# Patient Record
Sex: Male | Born: 1955
Health system: Southern US, Community
[De-identification: ages and names within clinical notes are randomized; demographics above are authoritative.]

## PROBLEM LIST (undated history)

## (undated) DIAGNOSIS — F17200 Nicotine dependence, unspecified, uncomplicated: Secondary | ICD-10-CM

## (undated) DIAGNOSIS — E119 Type 2 diabetes mellitus without complications: Secondary | ICD-10-CM

## (undated) DIAGNOSIS — H6060 Unspecified chronic otitis externa, unspecified ear: Secondary | ICD-10-CM

## (undated) DIAGNOSIS — J189 Pneumonia, unspecified organism: Secondary | ICD-10-CM

## (undated) DIAGNOSIS — I498 Other specified cardiac arrhythmias: Secondary | ICD-10-CM

## (undated) DIAGNOSIS — L03115 Cellulitis of right lower limb: Secondary | ICD-10-CM

## (undated) DIAGNOSIS — I10 Essential (primary) hypertension: Secondary | ICD-10-CM

## (undated) DIAGNOSIS — I214 Non-ST elevation (NSTEMI) myocardial infarction: Secondary | ICD-10-CM

## (undated) DIAGNOSIS — I2510 Atherosclerotic heart disease of native coronary artery without angina pectoris: Secondary | ICD-10-CM

## (undated) HISTORY — DX: Atherosclerotic heart disease of native coronary artery without angina pectoris: I25.10

## (undated) HISTORY — PX: CORONARY ANGIOPLASTY WITH STENT PLACEMENT: SHX49

## (undated) HISTORY — DX: Non-ST elevation (NSTEMI) myocardial infarction: I21.4

## (undated) HISTORY — DX: Pneumonia, unspecified organism: J18.9

## (undated) HISTORY — DX: Nicotine dependence, unspecified, uncomplicated: F17.200

---

## 1989-07-30 HISTORY — PX: CYSTECTOMY: SUR359

## 1998-08-23 ENCOUNTER — Encounter: Payer: Self-pay | Admitting: Emergency Medicine

## 1998-08-23 ENCOUNTER — Emergency Department (HOSPITAL_COMMUNITY): Admission: EM | Admit: 1998-08-23 | Discharge: 1998-08-23 | Payer: Self-pay | Admitting: Emergency Medicine

## 2000-04-07 ENCOUNTER — Encounter: Payer: Self-pay | Admitting: Internal Medicine

## 2000-04-07 ENCOUNTER — Ambulatory Visit (HOSPITAL_COMMUNITY): Admission: RE | Admit: 2000-04-07 | Discharge: 2000-04-07 | Payer: Self-pay | Admitting: Internal Medicine

## 2000-05-27 ENCOUNTER — Encounter: Payer: Self-pay | Admitting: Orthopedic Surgery

## 2000-05-27 ENCOUNTER — Ambulatory Visit (HOSPITAL_COMMUNITY): Admission: RE | Admit: 2000-05-27 | Discharge: 2000-05-27 | Payer: Self-pay | Admitting: Orthopedic Surgery

## 2001-07-17 ENCOUNTER — Emergency Department (HOSPITAL_COMMUNITY): Admission: EM | Admit: 2001-07-17 | Discharge: 2001-07-17 | Payer: Self-pay | Admitting: Emergency Medicine

## 2001-10-27 ENCOUNTER — Encounter: Payer: Self-pay | Admitting: Pulmonary Disease

## 2001-10-27 ENCOUNTER — Ambulatory Visit (HOSPITAL_COMMUNITY): Admission: RE | Admit: 2001-10-27 | Discharge: 2001-10-27 | Payer: Self-pay | Admitting: Pulmonary Disease

## 2002-01-30 ENCOUNTER — Encounter: Payer: Self-pay | Admitting: Pulmonary Disease

## 2002-01-30 ENCOUNTER — Ambulatory Visit (HOSPITAL_COMMUNITY): Admission: RE | Admit: 2002-01-30 | Discharge: 2002-01-30 | Payer: Self-pay | Admitting: Pulmonary Disease

## 2002-09-17 ENCOUNTER — Encounter: Admission: RE | Admit: 2002-09-17 | Discharge: 2002-09-17 | Payer: Self-pay | Admitting: Otolaryngology

## 2002-09-17 ENCOUNTER — Encounter: Payer: Self-pay | Admitting: Otolaryngology

## 2002-10-19 ENCOUNTER — Encounter: Admission: RE | Admit: 2002-10-19 | Discharge: 2002-10-19 | Payer: Self-pay | Admitting: Otolaryngology

## 2002-10-19 ENCOUNTER — Encounter: Payer: Self-pay | Admitting: Otolaryngology

## 2003-07-23 ENCOUNTER — Encounter: Payer: Self-pay | Admitting: Pulmonary Disease

## 2003-07-23 ENCOUNTER — Inpatient Hospital Stay (HOSPITAL_COMMUNITY): Admission: EM | Admit: 2003-07-23 | Discharge: 2003-07-26 | Payer: Self-pay | Admitting: Emergency Medicine

## 2003-07-23 ENCOUNTER — Encounter: Payer: Self-pay | Admitting: Emergency Medicine

## 2003-07-24 ENCOUNTER — Encounter: Payer: Self-pay | Admitting: Pulmonary Disease

## 2003-08-06 ENCOUNTER — Encounter: Admission: RE | Admit: 2003-08-06 | Discharge: 2003-11-04 | Payer: Self-pay | Admitting: Pulmonary Disease

## 2003-09-02 ENCOUNTER — Inpatient Hospital Stay (HOSPITAL_COMMUNITY): Admission: EM | Admit: 2003-09-02 | Discharge: 2003-09-04 | Payer: Self-pay | Admitting: Emergency Medicine

## 2003-09-02 ENCOUNTER — Encounter: Payer: Self-pay | Admitting: Emergency Medicine

## 2003-11-30 ENCOUNTER — Inpatient Hospital Stay (HOSPITAL_COMMUNITY): Admission: EM | Admit: 2003-11-30 | Discharge: 2003-12-03 | Payer: Self-pay | Admitting: Emergency Medicine

## 2004-01-08 ENCOUNTER — Emergency Department (HOSPITAL_COMMUNITY): Admission: EM | Admit: 2004-01-08 | Discharge: 2004-01-09 | Payer: Self-pay | Admitting: Emergency Medicine

## 2004-02-16 ENCOUNTER — Inpatient Hospital Stay (HOSPITAL_COMMUNITY): Admission: EM | Admit: 2004-02-16 | Discharge: 2004-02-21 | Payer: Self-pay | Admitting: *Deleted

## 2004-03-13 ENCOUNTER — Inpatient Hospital Stay (HOSPITAL_COMMUNITY): Admission: EM | Admit: 2004-03-13 | Discharge: 2004-03-19 | Payer: Self-pay | Admitting: Emergency Medicine

## 2004-08-06 ENCOUNTER — Ambulatory Visit: Payer: Self-pay | Admitting: *Deleted

## 2004-08-12 ENCOUNTER — Ambulatory Visit: Payer: Self-pay | Admitting: Internal Medicine

## 2004-08-21 ENCOUNTER — Ambulatory Visit (HOSPITAL_COMMUNITY): Admission: RE | Admit: 2004-08-21 | Discharge: 2004-08-21 | Payer: Self-pay | Admitting: Cardiology

## 2004-09-04 ENCOUNTER — Encounter (HOSPITAL_COMMUNITY): Admission: RE | Admit: 2004-09-04 | Discharge: 2004-12-03 | Payer: Self-pay | Admitting: Cardiology

## 2004-12-29 ENCOUNTER — Ambulatory Visit: Payer: Self-pay | Admitting: Internal Medicine

## 2005-01-05 ENCOUNTER — Ambulatory Visit: Payer: Self-pay | Admitting: Internal Medicine

## 2005-02-04 ENCOUNTER — Ambulatory Visit: Payer: Self-pay | Admitting: Internal Medicine

## 2005-07-08 ENCOUNTER — Ambulatory Visit: Payer: Self-pay | Admitting: Internal Medicine

## 2005-07-16 ENCOUNTER — Ambulatory Visit: Payer: Self-pay | Admitting: Internal Medicine

## 2005-08-26 ENCOUNTER — Ambulatory Visit: Payer: Self-pay | Admitting: Family Medicine

## 2005-09-17 ENCOUNTER — Ambulatory Visit: Payer: Self-pay | Admitting: Internal Medicine

## 2005-12-03 ENCOUNTER — Ambulatory Visit: Payer: Self-pay | Admitting: Internal Medicine

## 2005-12-14 ENCOUNTER — Ambulatory Visit: Payer: Self-pay | Admitting: Internal Medicine

## 2006-01-18 ENCOUNTER — Emergency Department (HOSPITAL_COMMUNITY): Admission: EM | Admit: 2006-01-18 | Discharge: 2006-01-18 | Payer: Self-pay | Admitting: Family Medicine

## 2006-03-10 ENCOUNTER — Ambulatory Visit: Payer: Self-pay | Admitting: Internal Medicine

## 2006-05-24 ENCOUNTER — Ambulatory Visit: Payer: Self-pay | Admitting: Internal Medicine

## 2006-05-30 ENCOUNTER — Ambulatory Visit: Payer: Self-pay | Admitting: Internal Medicine

## 2006-07-06 ENCOUNTER — Ambulatory Visit: Payer: Self-pay | Admitting: Internal Medicine

## 2006-07-06 ENCOUNTER — Encounter (INDEPENDENT_AMBULATORY_CARE_PROVIDER_SITE_OTHER): Payer: Self-pay | Admitting: *Deleted

## 2006-07-06 ENCOUNTER — Ambulatory Visit (HOSPITAL_COMMUNITY): Admission: RE | Admit: 2006-07-06 | Discharge: 2006-07-06 | Payer: Self-pay | Admitting: Internal Medicine

## 2006-07-28 ENCOUNTER — Emergency Department (HOSPITAL_COMMUNITY): Admission: EM | Admit: 2006-07-28 | Discharge: 2006-07-28 | Payer: Self-pay | Admitting: Emergency Medicine

## 2006-09-19 ENCOUNTER — Ambulatory Visit: Payer: Self-pay | Admitting: Internal Medicine

## 2006-11-02 ENCOUNTER — Encounter (INDEPENDENT_AMBULATORY_CARE_PROVIDER_SITE_OTHER): Payer: Self-pay | Admitting: *Deleted

## 2006-11-02 ENCOUNTER — Ambulatory Visit: Payer: Self-pay | Admitting: Internal Medicine

## 2006-11-02 LAB — CONVERTED CEMR LAB
ALT: 38 units/L (ref 0–53)
Alkaline Phosphatase: 56 units/L (ref 39–117)
BUN: 16 mg/dL (ref 6–23)
Basophils Relative: 0 % (ref 0–1)
Bilirubin, Direct: 0.1 mg/dL (ref 0.0–0.3)
Cholesterol: 195 mg/dL (ref 0–200)
Creatinine, Ser: 0.82 mg/dL (ref 0.40–1.50)
Eosinophils Relative: 3 % (ref 0–4)
HCT: 45.2 % (ref 41.0–49.0)
Hemoglobin: 16.1 g/dL (ref 13.9–16.8)
Indirect Bilirubin: 0.4 mg/dL (ref 0.0–0.9)
Lymphs Abs: 3.6 10*3/uL — ABNORMAL HIGH (ref 0.8–3.1)
MCV: 82 fL (ref 78.8–100.0)
Monocytes Absolute: 0.4 10*3/uL (ref 0.2–0.7)
Monocytes Relative: 6 % (ref 3–11)
RBC: 5.51 M/uL — ABNORMAL HIGH (ref 4.20–5.50)
Total Protein: 6.8 g/dL (ref 6.0–8.3)
Triglycerides: 99 mg/dL (ref <150)
WBC: 7 10*3/uL (ref 3.7–10.0)

## 2006-11-03 ENCOUNTER — Encounter (INDEPENDENT_AMBULATORY_CARE_PROVIDER_SITE_OTHER): Payer: Self-pay | Admitting: *Deleted

## 2006-11-03 LAB — CONVERTED CEMR LAB: Hgb A1c MFr Bld: 7.7 %

## 2006-11-25 ENCOUNTER — Encounter (INDEPENDENT_AMBULATORY_CARE_PROVIDER_SITE_OTHER): Payer: Self-pay | Admitting: *Deleted

## 2006-11-25 DIAGNOSIS — E119 Type 2 diabetes mellitus without complications: Secondary | ICD-10-CM | POA: Insufficient documentation

## 2006-11-25 DIAGNOSIS — I4891 Unspecified atrial fibrillation: Secondary | ICD-10-CM | POA: Insufficient documentation

## 2006-11-25 DIAGNOSIS — I2510 Atherosclerotic heart disease of native coronary artery without angina pectoris: Secondary | ICD-10-CM

## 2006-11-25 DIAGNOSIS — E291 Testicular hypofunction: Secondary | ICD-10-CM

## 2006-11-25 DIAGNOSIS — F1010 Alcohol abuse, uncomplicated: Secondary | ICD-10-CM | POA: Insufficient documentation

## 2006-11-25 DIAGNOSIS — E785 Hyperlipidemia, unspecified: Secondary | ICD-10-CM

## 2006-11-25 DIAGNOSIS — F17200 Nicotine dependence, unspecified, uncomplicated: Secondary | ICD-10-CM

## 2006-11-25 HISTORY — DX: Atherosclerotic heart disease of native coronary artery without angina pectoris: I25.10

## 2006-11-25 HISTORY — DX: Nicotine dependence, unspecified, uncomplicated: F17.200

## 2006-12-23 ENCOUNTER — Telehealth (INDEPENDENT_AMBULATORY_CARE_PROVIDER_SITE_OTHER): Payer: Self-pay | Admitting: *Deleted

## 2006-12-29 ENCOUNTER — Ambulatory Visit: Payer: Self-pay | Admitting: Hospitalist

## 2006-12-29 DIAGNOSIS — M25579 Pain in unspecified ankle and joints of unspecified foot: Secondary | ICD-10-CM

## 2006-12-29 DIAGNOSIS — M79609 Pain in unspecified limb: Secondary | ICD-10-CM | POA: Insufficient documentation

## 2006-12-29 LAB — CONVERTED CEMR LAB: Hgb A1c MFr Bld: 7 %

## 2007-01-03 ENCOUNTER — Encounter: Payer: Self-pay | Admitting: *Deleted

## 2007-02-15 ENCOUNTER — Ambulatory Visit (HOSPITAL_COMMUNITY): Admission: RE | Admit: 2007-02-15 | Discharge: 2007-02-15 | Payer: Self-pay | Admitting: Cardiology

## 2007-06-19 ENCOUNTER — Ambulatory Visit: Payer: Self-pay | Admitting: Internal Medicine

## 2007-06-19 ENCOUNTER — Encounter (INDEPENDENT_AMBULATORY_CARE_PROVIDER_SITE_OTHER): Payer: Self-pay | Admitting: *Deleted

## 2007-06-19 DIAGNOSIS — M25529 Pain in unspecified elbow: Secondary | ICD-10-CM | POA: Insufficient documentation

## 2007-06-19 LAB — CONVERTED CEMR LAB
Blood Glucose, Fingerstick: 150
Hgb A1c MFr Bld: 8.4 %
Neutrophil, Synovial: 26 % — ABNORMAL HIGH (ref 0–25)
WBC, Synovial: 7775 — ABNORMAL HIGH (ref 0–200)

## 2007-07-20 ENCOUNTER — Ambulatory Visit: Payer: Self-pay | Admitting: Internal Medicine

## 2007-07-20 ENCOUNTER — Ambulatory Visit (HOSPITAL_COMMUNITY): Admission: RE | Admit: 2007-07-20 | Discharge: 2007-07-20 | Payer: Self-pay | Admitting: Internal Medicine

## 2007-07-20 DIAGNOSIS — G8929 Other chronic pain: Secondary | ICD-10-CM

## 2007-07-20 DIAGNOSIS — M5416 Radiculopathy, lumbar region: Secondary | ICD-10-CM

## 2007-07-20 LAB — CONVERTED CEMR LAB: Blood Glucose, Fingerstick: 154

## 2007-08-14 ENCOUNTER — Ambulatory Visit: Payer: Self-pay | Admitting: Internal Medicine

## 2007-09-13 ENCOUNTER — Ambulatory Visit: Payer: Self-pay | Admitting: Internal Medicine

## 2007-09-13 ENCOUNTER — Encounter (INDEPENDENT_AMBULATORY_CARE_PROVIDER_SITE_OTHER): Payer: Self-pay | Admitting: *Deleted

## 2007-09-13 LAB — CONVERTED CEMR LAB
HDL: 31 mg/dL — ABNORMAL LOW (ref >39)
Hgb A1c MFr Bld: 7.9 %
LDL Cholesterol: 99 mg/dL (ref 0–99)

## 2008-03-01 ENCOUNTER — Ambulatory Visit: Payer: Self-pay | Admitting: Hospitalist

## 2008-03-01 DIAGNOSIS — S0080XA Unspecified superficial injury of other part of head, initial encounter: Secondary | ICD-10-CM

## 2008-03-01 DIAGNOSIS — S1090XA Unspecified superficial injury of unspecified part of neck, initial encounter: Secondary | ICD-10-CM

## 2008-03-01 DIAGNOSIS — S0000XA Unspecified superficial injury of scalp, initial encounter: Secondary | ICD-10-CM | POA: Insufficient documentation

## 2008-03-01 LAB — CONVERTED CEMR LAB: Blood Glucose, Fingerstick: 143

## 2008-03-13 ENCOUNTER — Telehealth (INDEPENDENT_AMBULATORY_CARE_PROVIDER_SITE_OTHER): Payer: Self-pay | Admitting: *Deleted

## 2008-05-06 ENCOUNTER — Ambulatory Visit: Payer: Self-pay | Admitting: Internal Medicine

## 2008-05-06 LAB — CONVERTED CEMR LAB: Blood Glucose, Fingerstick: 136

## 2008-06-14 ENCOUNTER — Encounter (INDEPENDENT_AMBULATORY_CARE_PROVIDER_SITE_OTHER): Payer: Self-pay | Admitting: *Deleted

## 2008-06-14 ENCOUNTER — Ambulatory Visit: Payer: Self-pay | Admitting: Infectious Diseases

## 2008-06-14 LAB — CONVERTED CEMR LAB
Blood Glucose, Fingerstick: 154
Hgb A1c MFr Bld: 7.7 %

## 2008-06-24 ENCOUNTER — Ambulatory Visit: Payer: Self-pay | Admitting: Internal Medicine

## 2008-06-27 ENCOUNTER — Telehealth (INDEPENDENT_AMBULATORY_CARE_PROVIDER_SITE_OTHER): Payer: Self-pay | Admitting: *Deleted

## 2008-06-28 ENCOUNTER — Ambulatory Visit: Payer: Self-pay | Admitting: Internal Medicine

## 2008-07-05 ENCOUNTER — Telehealth (INDEPENDENT_AMBULATORY_CARE_PROVIDER_SITE_OTHER): Payer: Self-pay | Admitting: *Deleted

## 2008-07-09 ENCOUNTER — Telehealth (INDEPENDENT_AMBULATORY_CARE_PROVIDER_SITE_OTHER): Payer: Self-pay | Admitting: *Deleted

## 2008-08-02 ENCOUNTER — Telehealth (INDEPENDENT_AMBULATORY_CARE_PROVIDER_SITE_OTHER): Payer: Self-pay | Admitting: *Deleted

## 2008-09-04 ENCOUNTER — Encounter (INDEPENDENT_AMBULATORY_CARE_PROVIDER_SITE_OTHER): Payer: Self-pay | Admitting: *Deleted

## 2008-09-13 ENCOUNTER — Telehealth: Payer: Self-pay | Admitting: Internal Medicine

## 2008-09-13 ENCOUNTER — Ambulatory Visit: Payer: Self-pay | Admitting: Internal Medicine

## 2008-09-16 ENCOUNTER — Ambulatory Visit: Payer: Self-pay | Admitting: Internal Medicine

## 2008-09-16 LAB — CONVERTED CEMR LAB
Blood Glucose, Fingerstick: 136
Hgb A1c MFr Bld: 6.6 %

## 2008-10-30 ENCOUNTER — Telehealth (INDEPENDENT_AMBULATORY_CARE_PROVIDER_SITE_OTHER): Payer: Self-pay | Admitting: *Deleted

## 2008-11-01 ENCOUNTER — Telehealth (INDEPENDENT_AMBULATORY_CARE_PROVIDER_SITE_OTHER): Payer: Self-pay | Admitting: *Deleted

## 2008-12-03 ENCOUNTER — Telehealth (INDEPENDENT_AMBULATORY_CARE_PROVIDER_SITE_OTHER): Payer: Self-pay | Admitting: *Deleted

## 2009-01-02 ENCOUNTER — Ambulatory Visit: Payer: Self-pay | Admitting: Internal Medicine

## 2009-01-02 ENCOUNTER — Encounter (INDEPENDENT_AMBULATORY_CARE_PROVIDER_SITE_OTHER): Payer: Self-pay | Admitting: *Deleted

## 2009-01-02 LAB — CONVERTED CEMR LAB: Blood Glucose, Fingerstick: 137

## 2009-01-06 LAB — CONVERTED CEMR LAB: Microalb, Ur: 57.44 mg/dL — ABNORMAL HIGH (ref 0.00–1.89)

## 2009-01-07 ENCOUNTER — Telehealth: Payer: Self-pay | Admitting: *Deleted

## 2009-01-09 ENCOUNTER — Ambulatory Visit: Payer: Self-pay | Admitting: Internal Medicine

## 2009-01-09 ENCOUNTER — Encounter (INDEPENDENT_AMBULATORY_CARE_PROVIDER_SITE_OTHER): Payer: Self-pay | Admitting: *Deleted

## 2009-01-15 LAB — CONVERTED CEMR LAB
Albumin: 4.1 g/dL (ref 3.5–5.2)
Alkaline Phosphatase: 63 units/L (ref 39–117)
BUN: 18 mg/dL (ref 6–23)
Glucose, Bld: 153 mg/dL — ABNORMAL HIGH (ref 70–99)
HDL: 47 mg/dL (ref >39)
LDL Cholesterol: 131 mg/dL — ABNORMAL HIGH (ref 0–99)
Total Bilirubin: 0.5 mg/dL (ref 0.3–1.2)
Triglycerides: 158 mg/dL — ABNORMAL HIGH (ref <150)

## 2009-01-16 ENCOUNTER — Telehealth (INDEPENDENT_AMBULATORY_CARE_PROVIDER_SITE_OTHER): Payer: Self-pay | Admitting: *Deleted

## 2009-01-17 ENCOUNTER — Telehealth (INDEPENDENT_AMBULATORY_CARE_PROVIDER_SITE_OTHER): Payer: Self-pay | Admitting: *Deleted

## 2009-02-04 ENCOUNTER — Telehealth (INDEPENDENT_AMBULATORY_CARE_PROVIDER_SITE_OTHER): Payer: Self-pay | Admitting: *Deleted

## 2009-04-29 ENCOUNTER — Ambulatory Visit: Payer: Self-pay | Admitting: Internal Medicine

## 2009-04-30 ENCOUNTER — Encounter (INDEPENDENT_AMBULATORY_CARE_PROVIDER_SITE_OTHER): Payer: Self-pay | Admitting: *Deleted

## 2009-08-12 ENCOUNTER — Telehealth: Payer: Self-pay | Admitting: *Deleted

## 2009-08-14 ENCOUNTER — Ambulatory Visit: Payer: Self-pay | Admitting: Infectious Diseases

## 2009-08-14 DIAGNOSIS — K089 Disorder of teeth and supporting structures, unspecified: Secondary | ICD-10-CM | POA: Insufficient documentation

## 2009-08-14 LAB — CONVERTED CEMR LAB: Blood Glucose, Fingerstick: 170

## 2009-08-27 ENCOUNTER — Encounter: Payer: Self-pay | Admitting: Internal Medicine

## 2009-09-23 ENCOUNTER — Ambulatory Visit: Payer: Self-pay | Admitting: Internal Medicine

## 2009-09-23 DIAGNOSIS — R05 Cough: Secondary | ICD-10-CM

## 2009-10-06 ENCOUNTER — Telehealth: Payer: Self-pay | Admitting: Internal Medicine

## 2009-10-08 ENCOUNTER — Ambulatory Visit (HOSPITAL_COMMUNITY): Admission: RE | Admit: 2009-10-08 | Discharge: 2009-10-08 | Payer: Self-pay | Admitting: Infectious Disease

## 2009-10-08 ENCOUNTER — Ambulatory Visit: Payer: Self-pay | Admitting: Infectious Disease

## 2009-10-08 DIAGNOSIS — R0789 Other chest pain: Secondary | ICD-10-CM

## 2009-10-08 LAB — CONVERTED CEMR LAB
Blood Glucose, Fingerstick: 151
Eosinophils Absolute: 0.2 10*3/uL (ref 0.0–0.7)
Lymphocytes Relative: 45 % (ref 12–46)
Lymphs Abs: 3.1 10*3/uL (ref 0.7–4.0)
MCV: 90.9 fL (ref >=78.0)
Monocytes Relative: 7 % (ref 3–12)
Neutro Abs: 3 10*3/uL (ref 1.7–7.7)
Neutrophils Relative %: 45 % (ref 43–77)
RBC: 4.62 M/uL (ref 4.22–5.81)
WBC: 6.8 10*3/uL (ref 4.0–10.5)

## 2009-10-09 ENCOUNTER — Telehealth: Payer: Self-pay | Admitting: Internal Medicine

## 2009-11-17 ENCOUNTER — Telehealth: Payer: Self-pay | Admitting: Internal Medicine

## 2009-12-17 ENCOUNTER — Telehealth: Payer: Self-pay | Admitting: Internal Medicine

## 2010-01-19 ENCOUNTER — Ambulatory Visit (HOSPITAL_COMMUNITY): Admission: RE | Admit: 2010-01-19 | Discharge: 2010-01-19 | Payer: Self-pay | Admitting: Internal Medicine

## 2010-01-19 ENCOUNTER — Encounter: Payer: Self-pay | Admitting: Internal Medicine

## 2010-01-19 ENCOUNTER — Ambulatory Visit: Payer: Self-pay | Admitting: Internal Medicine

## 2010-01-19 LAB — CONVERTED CEMR LAB
Blood Glucose, Fingerstick: 137
Hgb A1c MFr Bld: 7.4 %

## 2010-01-21 ENCOUNTER — Ambulatory Visit: Payer: Self-pay | Admitting: Internal Medicine

## 2010-02-04 LAB — CONVERTED CEMR LAB
ALT: 25 units/L (ref 0–53)
AST: 18 units/L (ref 0–37)
Alkaline Phosphatase: 67 units/L (ref 39–117)
Cholesterol: 206 mg/dL — ABNORMAL HIGH (ref 0–200)
Creatinine, Ser: 0.8 mg/dL (ref 0.40–1.50)
Sodium: 138 meq/L (ref 135–145)
Testosterone-% Free: 2.4 % (ref 1.6–2.9)
Total Bilirubin: 0.3 mg/dL (ref 0.3–1.2)
Total CHOL/HDL Ratio: 5.9
Total Protein: 7 g/dL (ref 6.0–8.3)
Triglycerides: 158 mg/dL — ABNORMAL HIGH (ref <150)
VLDL: 32 mg/dL (ref 0–40)

## 2010-02-18 ENCOUNTER — Telehealth: Payer: Self-pay | Admitting: Internal Medicine

## 2010-04-08 ENCOUNTER — Ambulatory Visit (HOSPITAL_COMMUNITY): Admission: RE | Admit: 2010-04-08 | Discharge: 2010-04-08 | Payer: Self-pay | Admitting: Internal Medicine

## 2010-04-08 ENCOUNTER — Ambulatory Visit: Payer: Self-pay | Admitting: Internal Medicine

## 2010-04-08 DIAGNOSIS — K219 Gastro-esophageal reflux disease without esophagitis: Secondary | ICD-10-CM | POA: Insufficient documentation

## 2010-08-31 ENCOUNTER — Telehealth: Payer: Self-pay | Admitting: Internal Medicine

## 2010-09-03 ENCOUNTER — Telehealth: Payer: Self-pay | Admitting: Internal Medicine

## 2010-09-15 ENCOUNTER — Telehealth: Payer: Self-pay | Admitting: Internal Medicine

## 2010-09-16 ENCOUNTER — Ambulatory Visit: Payer: Self-pay | Admitting: Internal Medicine

## 2010-11-16 ENCOUNTER — Telehealth: Payer: Self-pay | Admitting: Internal Medicine

## 2010-12-29 NOTE — Progress Notes (Signed)
Summary: Refill/gh  Phone Note Refill Request Message from:  Fax from Pharmacy on December 17, 2009 11:42 AM  Refills Requested: Medication #1:  ZESTRIL 10 MG TABS take 1 pill by mouth daily.   Last Refilled: 11/15/2009 Last appointment was 10/08/2009 and last labs were 12/30/2008   Method Requested: Electronic Initial call taken by: Angelina Ok RN,  December 17, 2009 11:43 AM  Follow-up for Phone Call        needs to be seen w/in next 30 days.  No metabolic panel in last 1 year although seen in clinic for other issues.    will give 1 month supply only Follow-up by: Mariea Stable MD,  December 18, 2009 11:08 PM    Prescriptions: ZESTRIL 10 MG TABS (LISINOPRIL) take 1 pill by mouth daily.  #30 x 0   Entered and Authorized by:   Mariea Stable MD   Signed by:   Mariea Stable MD on 12/18/2009   Method used:   Electronically to        [facility] (retail)       [address]       [place], [place]  [address]       Ph: [phone]       Fax: [phone]   RxID:   [id number]

## 2010-12-29 NOTE — Assessment & Plan Note (Signed)
Summary: est-ck/fu/meds/cfb   Vital Signs:  Patient profile:   55 year old male Height:      75 inches (190.50 cm) Weight:      285.4 pounds (129.73 kg) BMI:     35.80 Temp:     97.9 degrees F (36.61 degrees C) oral Pulse rate:   78 / minute BP sitting:   118 / 75  (right arm) Cuff size:   large  Vitals Entered By: Krystal Eaton Duncan Dull) (January 19, 2010 3:03 PM) CC: cold-lkes sxs x67mths, med refill, right arm pain off and on x Is Patient Diabetic? Yes Did you bring your meter with you today? No Pain Assessment Patient in pain? yes     Location: right arm Intensity: 4 Type: aching Onset of pain  off and on  Nutritional Status BMI of > 30 = obese CBG Result 137  Have you ever been in a relationship where you felt threatened, hurt or afraid?No   Does patient need assistance? Functional Status Self care Ambulation Normal   Primary Care Provider:  Mariea Stable MD  CC:  cold-lkes sxs x23mths, med refill, and right arm pain off and on x .  History of Present Illness: [name] is a 55 yo man with pMH as outlined in chart.  He reports a cold for last 4 months.  Also needs refills.  As for the cold, it consists of sneezing and cough productive of yellow-green sputum.  He was last seen and had some labs and CXR done at that time.  He states this comes and goes.  Feels its worse at night after lying down.  Reports he feels it builds up in his throat.  No fevers or chills.  No chest pain or sob.  Preventive Screening-Counseling & Management  Alcohol-Tobacco     Smoking Status: quit     Year Quit: 2003  Current Medications (verified): 1)  Aspirin 81 Mg Tbec (Aspirin) .... Take 1 Tablet By Mouth Once A Day 2)  Metformin Hcl 1000 Mg  Tabs (Metformin Hcl) .... Take 1 Tablet By Mouth Two Times A Day 3)  Imdur 60 Mg Tb24 (Isosorbide Mononitrate) .... Take 1 Tablet By Mouth Once A Day 4)  Sotalol Hcl 80 Mg Tabs (Sotalol Hcl) .... Take 1/2 Tablet By Mouth Every 12  Hours 5)  Nitroquick 0.4 Mg Subl (Nitroglycerin) .... Take Sublingually As Needed With A Daily Maximum of 3 Tablets 6)  Triamcinolone Acetonide 0.1 %  Crea (Triamcinolone Acetonide) .... Apply To Scalp and Ear Once A Day 7)  Truetrack Test   Strp (Glucose Blood) .... Use To Test Blood Glucsoe 2-3x Daily 8)  Lancets   Misc (Lancets) .... Use To Test Blood Glucsoe 2-3x Daily 9)  Zestril 10 Mg Tabs (Lisinopril) .... Take 1 Pill By Mouth Daily. 10)  Pravachol 40 Mg Tabs (Pravastatin Sodium) .... Take 1 Tablet By Mouth Once A Day 11)  Vicodin 5-500 Mg Tabs (Hydrocodone-Acetaminophen) .... Take One Pill Every 6 Hours As Needed For Elbow Pain. 12)  Glipizide 5 Mg Xr24h-Tab (Glipizide) .... Take 1 Tablet By Mouth Once A Day  Allergies (verified): No Known Drug Allergies  Past History:  Past Medical History: Last updated: 06/28/2008 Coronary artery disease (S/P PTCA with stent placement in 2003). Diabetes mellitus, type II (diagnosed in 2004). Hyperlipidemia. Tobacco Abuse. Testicular Hypogonadism. Atrial fibrillation:  follows Dr Sharyn Lull. Alcohol Abuse: in remission:  quit 3 years ago. Right foot pain and swelling. Right elbow pain. Nausea, diaphoresis.  Past  Surgical History: Last updated: 11/25/2006 PTCA/stent placement 2003  Family History: Last updated: 07-07-08 Mom- died of ? uterine cancer at 59 yrs. Dad died in his 29's- unknown medical problems. 2 Brothers- healthy 2 Sisters- healthy.  Social History: Last updated: 07-Jul-2008 Former Smoker. Works as a Education administrator. Married, no kids. Studied upto 12th grade.  Risk Factors: Smoking Status: quit (01/19/2010)  Review of Systems      See HPI  Physical Exam  General:  alert, well-developed, and well-nourished.   Eyes:  vision grossly intact, pupils equal, pupils round, and pupils reactive to light.  anicteric   Impression & Recommendations:  Problem # 1:  COUGH (ICD-786.2) likely post nasal drip will give claritin  and flonase  Problem # 2:  ATRIAL FIBRILLATION (ICD-427.31)  Followed by Dr. Sharyn Lull Will check electrolytes and ECG for QT since we have prescribed sotalol in past Advised to discuss with Dr. Sharyn Lull and have him manage the sotalol On ASA given CHADS = 1  His updated medication list for this problem includes:    Aspirin 81 Mg Tbec (Aspirin) .Marland Kitchen... Take 1 tablet by mouth once a day    Sotalol Hcl 80 Mg Tabs (Sotalol hcl) .Marland Kitchen... Take 1/2 tablet by mouth every 12 hours  Orders: 12 Lead EKG (12 Lead EKG)  Problem # 3:  DIABETES MELLITUS, TYPE II (ICD-250.00) Hgb A1c improved will continue current regimen and have pt check glucose before breakfast. Bring meter next visit to see if glipizide can be increased.  His updated medication list for this problem includes:    Aspirin 81 Mg Tbec (Aspirin) .Marland Kitchen... Take 1 tablet by mouth once a day    Metformin Hcl 1000 Mg Tabs (Metformin hcl) .Marland Kitchen... Take 1 tablet by mouth two times a day    Zestril 10 Mg Tabs (Lisinopril) .Marland Kitchen... Take 1 pill by mouth daily.    Glipizide 5 Mg Xr24h-tab (Glipizide) .Marland Kitchen... Take 1 tablet by mouth once a day  Orders: T-Hgb A1C (in-house) ([address]UE) T- Capillary Blood Glucose ([address])  Labs Reviewed: Creat: 0.81 (01/09/2009)     Last Eye Exam: No diabetic retinopathy.    (08/27/2009) Reviewed HgBA1c results: 7.4 (01/19/2010)  8.4 (08/14/2009)  Problem # 4:  HYPERLIPIDEMIA (ICD-272.4)  not taking statin will refill check lipids and LFTs while fasting will need to recheck in 3 months to see if pravachol 40 is enough  His updated medication list for this problem includes:    Pravachol 40 Mg Tabs (Pravastatin sodium) .Marland Kitchen... Take 1 tablet by mouth once a day  Orders: T-Lipid Profile [id number])  Labs Reviewed: SGOT: 15 (01/09/2009)   SGPT: 24 (01/09/2009)   HDL:47 (01/09/2009), 31 (09/13/2007)  LDL:131 (01/09/2009), 99 (56/21/3086)  Chol:210 (01/09/2009), 146 (09/13/2007)  Trig:158 (01/09/2009), 81  (09/13/2007)  Problem # 5:  ELBOW PAIN (ICD-719.42) does well with as needed vicodin will refill as he has been on this for some time will need to review work up in past  Problem # 6:  HYPOGONADISM (ICD-257.2) pt not using testosterone supplements 2/2 cost will recheck total and free levels discussed, energy levels adequate and spontaneous erections still occuring (libido is down)  Orders: T-Testosterone; Total [phone]) T- * Misc. Laboratory test [phone])  Complete Medication List: 1)  Aspirin 81 Mg Tbec (Aspirin) .... Take 1 tablet by mouth once a day 2)  Metformin Hcl 1000 Mg Tabs (Metformin hcl) .... Take 1 tablet by mouth two times a day 3)  Imdur 60 Mg Tb24 (Isosorbide mononitrate) .... Take 1 tablet by  mouth once a day 4)  Sotalol Hcl 80 Mg Tabs (Sotalol hcl) .... Take 1/2 tablet by mouth every 12 hours 5)  Nitroquick 0.4 Mg Subl (Nitroglycerin) .... Take sublingually as needed with a daily maximum of 3 tablets 6)  Triamcinolone Acetonide 0.1 % Crea (Triamcinolone acetonide) .... Apply to scalp and ear once a day 7)  Truetrack Test Strp (Glucose blood) .... Use to test blood glucsoe 2-3x daily 8)  Lancets Misc (Lancets) .... Use to test blood glucsoe 2-3x daily 9)  Zestril 10 Mg Tabs (Lisinopril) .... Take 1 pill by mouth daily. 10)  Pravachol 40 Mg Tabs (Pravastatin sodium) .... Take 1 tablet by mouth once a day 11)  Vicodin 5-500 Mg Tabs (Hydrocodone-acetaminophen) .... Take one pill every 6 hours as needed for elbow pain. 12)  Glipizide 5 Mg Xr24h-tab (Glipizide) .... Take 1 tablet by mouth once a day 13)  Claritin 10 Mg Tabs (Loratadine) .... Take 1 tablet by mouth once a day 14)  Flonase 50 Mcg/act Susp (Fluticasone propionate) .... Use 2 sprays in each nostril prior to bedtime  Other Orders: T-Comprehensive Metabolic Panel ([id number])  Patient Instructions: 1)  Please schedule a follow-up appointment in 3 months. 2)  make sure to check your sugar most days  before breakfast and bring your meter in next visit. 3)  remember to follow up with Dr. Sharyn Lull and discuss the sotalol and imdur with him 4)  will put in for some labs, you will need to come back on an empty stomach to have them done. 5)  if you have any problems, call clinic.  6)  have sent in for claritin and flonase for your cough. Prescriptions: VICODIN 5-500 MG TABS (HYDROCODONE-ACETAMINOPHEN) Take one pill every 6 hours as needed for elbow pain.  #60 x 1   Entered and Authorized by:   Tyrone Stable MD   Signed by:   Tyrone Stable MD on 01/19/2010   Method used:   Print then Give to Patient   RxID:   [id number] FLONASE 50 MCG/ACT SUSP (FLUTICASONE PROPIONATE) use 2 sprays in each nostril prior to bedtime  #1 x 0   Entered and Authorized by:   Tyrone Stable MD   Signed by:   Tyrone Stable MD on 01/19/2010   Method used:   Electronically to        [facility] (retail)       [address]       [place], [place]  [address]       Ph: [phone]       Fax: [phone]   RxID:   [id number] CLARITIN 10 MG TABS (LORATADINE) Take 1 tablet by mouth once a day  #30 x 0   Entered and Authorized by:   Tyrone Stable MD   Signed by:   Tyrone Stable MD on 01/19/2010   Method used:   Electronically to        [facility] (retail)       [address]       [place], [place]  [address]       Ph: [phone]       Fax: [phone]   RxID:   [id number] PRAVACHOL 40 MG TABS (PRAVASTATIN SODIUM) Take 1 tablet by mouth once a day  #30 x 6   Entered and Authorized by:   Tyrone Stable MD   Signed  by:   Tyrone Stable MD on 01/19/2010   Method used:   Electronically to        [facility] (retail)       [address]       [place], [place]  [address]       Ph: [phone]       Fax: [phone]   RxID:   [id number] ZESTRIL 10 MG TABS (LISINOPRIL) take 1 pill by  mouth daily.  #30 x 3   Entered and Authorized by:   Tyrone Stable MD   Signed by:   Tyrone Stable MD on 01/19/2010   Method used:   Electronically to        [facility] (retail)       [address]       [place], [place]  [address]       Ph: [phone]       Fax: [phone]   RxID:   [id number] TRIAMCINOLONE ACETONIDE 0.1 %  CREA (TRIAMCINOLONE ACETONIDE) Apply to scalp and ear once a day  #1 month x 2   Entered and Authorized by:   Tyrone Stable MD   Signed by:   Tyrone Stable MD on 01/19/2010   Method used:   Electronically to        [facility] (retail)       [address]       [place], [place]  [address]       Ph: [phone]       Fax: [phone]   RxID:   [id number] NITROQUICK 0.4 MG SUBL (NITROGLYCERIN) Take sublingually as needed with a daily maximum of 3 tablets  #30 x 3   Entered and Authorized by:   Tyrone Stable MD   Signed by:   Tyrone Stable MD on 01/19/2010   Method used:   Electronically to        [facility] (retail)       [address]       [place], [place]  [address]       Ph: [phone]       Fax: [phone]   RxID:   [id number]   Prevention & Chronic Care Immunizations   Influenza vaccine: Fluvax Non-MCR  (10/08/2009)   Influenza vaccine deferral: Deferred  (09/23/2009)    Tetanus booster: Not documented   Td booster deferral: Deferred  (01/19/2010)    Pneumococcal vaccine: Not documented  Colorectal Screening   Hemoccult: Not documented   Hemoccult action/deferral: Deferred  (08/14/2009)    Colonoscopy: Not documented   Colonoscopy action/deferral: Deferred  (08/14/2009)  Other Screening   PSA: Not documented   Smoking status: quit  (01/19/2010)  Diabetes Mellitus   HgbA1C: 7.4  (01/19/2010)   HgbA1C action/deferral: Ordered  (08/14/2009)    Eye exam: No diabetic retinopathy.     (08/27/2009)   Eye  exam due: 08/2010    Foot exam: yes  (08/14/2009)   Foot exam action/deferral: Do today   High risk foot: Not documented   Foot care education: Done  (08/14/2009)    Urine microalbumin/creatinine ratio: 464.8  (01/02/2009)   Urine microalbumin action/deferral: Not indicated    Diabetes flowsheet reviewed?: Yes   Progress toward A1C goal: Unchanged  Lipids   Total  Cholesterol: 210  (01/09/2009)   Lipid panel action/deferral: Lipid Panel ordered   LDL: 131  (01/09/2009)   LDL Direct: Not documented   HDL: 47  (01/09/2009)   Triglycerides: 158  (01/09/2009)    SGOT (AST): 15  (01/09/2009)   BMP action: Ordered   SGPT (ALT): 24  (01/09/2009) CMP ordered    Alkaline phosphatase: 63  (01/09/2009)   Total bilirubin: 0.5  (01/09/2009)    Lipid flowsheet reviewed?: Yes   Progress toward LDL goal: Unchanged  Self-Management Support :   Personal Goals (by the next clinic visit) :     Personal A1C goal: 7  (08/14/2009)     Personal blood pressure goal: 130/80  (08/14/2009)     Personal LDL goal: 70  (09/23/2009)    Patient will work on the following items until the next clinic visit to reach self-care goals:     Medications and monitoring: take my medicines every day  (01/19/2010)     Eating: drink diet soda or water instead of juice or soda, eat foods that are low in salt, eat baked foods instead of fried foods  (01/19/2010)     Activity: take a 30 minute walk every day  (08/14/2009)     Other: also lifts weights  (08/14/2009)    Diabetes self-management support: CBG self-monitoring log, Resources for patients handout, Written self-care plan  (01/19/2010)   Diabetes care plan printed   Last diabetes self-management training by diabetes educator: 09/13/2007   Last medical nutrition therapy: 06/24/2008    Lipid self-management support: CBG self-monitoring log, Resources for patients handout, Written self-care plan  (01/19/2010)   Lipid self-care plan printed.      Resource  handout printed.  Process Orders Check Orders Results:     [facility]: ABN not required for this insurance Tests Sent for requisitioning (January 19, 2010 3:52 PM):     01/19/2010: [facility] -- T-Lipid Profile [id number] (signed)     01/19/2010: [facility] -- T-Comprehensive Metabolic Panel [[id number]] (signed)     01/19/2010: [facility] -- T-Testosterone; Total [id number] (signed)     01/19/2010: [facility] -- T- * Misc. Laboratory test [phone] (signed)   Laboratory Results   Blood Tests   Date/Time Received: January 19, 2010 3:26 PM  Date/Time Reported: Burke Keels  January 19, 2010 3:26 PM   HGBA1C: 7.4%   (Normal Range: Non-Diabetic - 3-6%   Control Diabetic - 6-8%) CBG Random:: 137mg /dL

## 2010-12-29 NOTE — Progress Notes (Signed)
Summary: med refill/gp  Phone Note Refill Request Message from:  Fax from Pharmacy on September 03, 2010 11:26 AM  Refills Requested: Medication #1:  PRAVACHOL 40 MG TABS Take 1 tablet by mouth once a day   Last Refilled: 07/21/2010 Last appt. 04/08/10; next appt. 09/16/10.   Method Requested: Electronic Initial call taken by: Chinita Pester RN,  September 03, 2010 11:25 AM  Follow-up for Phone Call        Rx faxed to pharmacy Follow-up by: Mariea Stable MD,  September 03, 2010 4:46 PM    Prescriptions: PRAVACHOL 40 MG TABS (PRAVASTATIN SODIUM) Take 1 tablet by mouth once a day  #30 x 6   Entered and Authorized by:   Mariea Stable MD   Signed by:   Mariea Stable MD on 09/03/2010   Method used:   Electronically to        [facility] (retail)       [address]       [place], [place]  [address]       Ph: [phone]       Fax: [phone]   RxID:   [id number]

## 2010-12-29 NOTE — Assessment & Plan Note (Signed)
Summary: EST-CK/FU/MEDS/CFB   Vital Signs:  Patient profile:   55 year old male Height:      75 inches (190.50 cm) Weight:      281.7 pounds (128.05 kg) BMI:     35.34 Temp:     97.6 degrees F oral Pulse rate:   70 / minute BP sitting:   122 / 77  (right arm)  Vitals Entered By: Tyrone Pester RN (Apr 08, 2010 4:17 PM) CC: Coughing x 3 months or more  and hoarseness x 2 weeks.  Toothache. Med refills. Is Patient Diabetic? Yes Did you bring your meter with you today? No Pain Assessment Patient in pain? no      Nutritional Status BMI of > 30 = obese CBG Result 124  Have you ever been in a relationship where you felt threatened, hurt or afraid?Unable to ask; someone w/pt.   Does patient need assistance? Functional Status Self care Ambulation Normal   Primary Care Provider:  Mariea Stable MD  CC:  Coughing x 3 months or more  and hoarseness x 2 weeks.  Toothache. Med refills..  History of Present Illness: [name] comes in today complaining of continued PM cough, sore throat and horseness. Positional, worse with lying down, no CP, no orthopnea, cough is non-productive. Wakes up in am with "burning throat". No nasal congestion. c/o dental pain, chronic infected tooth, poor dentition, has been unable to see a dentist or afford one.   Depression History:      The patient denies a depressed mood most of the day and a diminished interest in his usual daily activities.        The patient denies that he feels like life is not worth living, denies that he wishes that he were dead, and denies that he has thought about ending his life.         Preventive Screening-Counseling & Management  Alcohol-Tobacco     Alcohol drinks/day: 0     Smoking Status: quit     Year Quit: 2003  Caffeine-Diet-Exercise     Does Patient Exercise: no  Current Medications (verified): 1)  Aspirin 81 Mg Tbec (Aspirin) .... Take 1 Tablet By Mouth Once A Day 2)  Metformin Hcl 1000 Mg  Tabs (Metformin  Hcl) .... Take 1 Tablet By Mouth Two Times A Day 3)  Imdur 60 Mg Tb24 (Isosorbide Mononitrate) .... Take 1 Tablet By Mouth Once A Day 4)  Sotalol Hcl 80 Mg Tabs (Sotalol Hcl) .... Take 1/2 Tablet By Mouth Every 12 Hours 5)  Nitroquick 0.4 Mg Subl (Nitroglycerin) .... Take Sublingually As Needed With A Daily Maximum of 3 Tablets 6)  Triamcinolone Acetonide 0.1 %  Crea (Triamcinolone Acetonide) .... Apply To Scalp and Ear Once A Day 7)  Truetrack Test   Strp (Glucose Blood) .... Use To Test Blood Glucsoe 2-3x Daily 8)  Lancets   Misc (Lancets) .... Use To Test Blood Glucsoe 2-3x Daily 9)  Zestril 10 Mg Tabs (Lisinopril) .... Take 1 Pill By Mouth Daily. 10)  Pravachol 40 Mg Tabs (Pravastatin Sodium) .... Take 1 Tablet By Mouth Once A Day 11)  Vicodin 5-500 Mg Tabs (Hydrocodone-Acetaminophen) .... Take One Pill Every 6 Hours As Needed For Elbow Pain. 12)  Glipizide 5 Mg Xr24h-Tab (Glipizide) .... Take 1 Tablet By Mouth Once A Day 13)  Claritin 10 Mg Tabs (Loratadine) .... Take 1 Tablet By Mouth Once A Day 14)  Flonase 50 Mcg/act Susp (Fluticasone Propionate) .... Use 2 Sprays in Each  Nostril Prior To Bedtime 15)  Prilosec 20 Mg Cpdr (Omeprazole) .... Take 1 Tablet By Mouth Once A Day  Allergies (verified): No Known Drug Allergies  Review of Systems      See HPI  Physical Exam  General:  alert, well-developed, and well-nourished.   Mouth:  pharyngeal erythema and poor dentition.  lower right mandibular molar is cracked and there is a localized gum swelling partial plate is irritating gum in several places-poorly fitted. Neck:  supple no adenopathy Lungs:  normal respiratory effort and normal breath sounds.   Heart:  normal rate IRIR  Abdomen:  soft and non-tender.   Msk:  no joint tenderness and no joint swelling.   Skin:  no rashes.   Psych:  Oriented X3, memory intact for recent and remote, normally interactive, and good eye contact.     Impression & Recommendations:  Problem # 1:   GERD (ICD-530.81) Likely esophageal reflux contributing to PM cough and sire throat. Will do a 2-3 week trial of PPI to see if this helps. If not he will need further w/o for chronic cough.  This has been on goping for several month. No help with Claritin and Flonase.  His updated medication list for this problem includes:    Prilosec 20 Mg Cpdr (Omeprazole) .Marland Kitchen... Take 1 tablet by mouth once a day  Problem # 2:  DENTAL PAIN (ICD-525.9) Chronic problem for which he has gotten recurrent prescriptions for pain meds and abx. Will get orthopantogram today to see if there is an abscess. Will give anotherscript for vicodin- no refills.  Orders: Diagnostic X-Ray/Fluoroscopy (Diagnostic X-Ray/Flu)  Problem # 3:  DIABETES MELLITUS, TYPE II (ICD-250.00) Reports taking medication and doing well. No CBG readings today. Due for A1C in 1 month.   His updated medication list for this problem includes:    Aspirin 81 Mg Tbec (Aspirin) .Marland Kitchen... Take 1 tablet by mouth once a day    Metformin Hcl 1000 Mg Tabs (Metformin hcl) .Marland Kitchen... Take 1 tablet by mouth two times a day    Zestril 10 Mg Tabs (Lisinopril) .Marland Kitchen... Take 1 pill by mouth daily.    Glipizide 5 Mg Xr24h-tab (Glipizide) .Marland Kitchen... Take 1 tablet by mouth once a day  Orders: Capillary Blood Glucose/CBG ([address])  Labs Reviewed: Creat: 0.80 (01/21/2010)     Last Eye Exam: No diabetic retinopathy.    (08/27/2009) Reviewed HgBA1c results: 7.4 (01/19/2010)  8.4 (08/14/2009)  Problem # 4:  HYPERLIPIDEMIA (ICD-272.4)  Refilled meds today.  His updated medication list for this problem includes:    Pravachol 40 Mg Tabs (Pravastatin sodium) .Marland Kitchen... Take 1 tablet by mouth once a day  Labs Reviewed: SGOT: 18 (01/21/2010)   SGPT: 25 (01/21/2010)   HDL:35 (01/21/2010), 47 (01/09/2009)  LDL:139 (01/21/2010), 131 (01/09/2009)  Chol:206 (01/21/2010), 210 (01/09/2009)  Trig:158 (01/21/2010), 158 (01/09/2009)  Complete Medication List: 1)  Aspirin 81 Mg Tbec (Aspirin)  .... Take 1 tablet by mouth once a day 2)  Metformin Hcl 1000 Mg Tabs (Metformin hcl) .... Take 1 tablet by mouth two times a day 3)  Imdur 60 Mg Tb24 (Isosorbide mononitrate) .... Take 1 tablet by mouth once a day 4)  Sotalol Hcl 80 Mg Tabs (Sotalol hcl) .... Take 1/2 tablet by mouth every 12 hours 5)  Nitroquick 0.4 Mg Subl (Nitroglycerin) .... Take sublingually as needed with a daily maximum of 3 tablets 6)  Triamcinolone Acetonide 0.1 % Crea (Triamcinolone acetonide) .... Apply to scalp and ear once a day 7)  Truetrack Test Strp (Glucose blood) .... Use to test blood glucsoe 2-3x daily 8)  Lancets Misc (Lancets) .... Use to test blood glucsoe 2-3x daily 9)  Zestril 10 Mg Tabs (Lisinopril) .... Take 1 pill by mouth daily. 10)  Pravachol 40 Mg Tabs (Pravastatin sodium) .... Take 1 tablet by mouth once a day 11)  Vicodin 5-500 Mg Tabs (Hydrocodone-acetaminophen) .... Take one pill every 6 hours as needed for elbow pain. 12)  Glipizide 5 Mg Xr24h-tab (Glipizide) .... Take 1 tablet by mouth once a day 13)  Claritin 10 Mg Tabs (Loratadine) .... Take 1 tablet by mouth once a day 14)  Flonase 50 Mcg/act Susp (Fluticasone propionate) .... Use 2 sprays in each nostril prior to bedtime 15)  Prilosec 20 Mg Cpdr (Omeprazole) .... Take 1 tablet by mouth once a day  Patient Instructions: 1)  Please schedule a follow-up appointment in 2-3 months. Prescriptions: IMDUR 60 MG TB24 (ISOSORBIDE MONONITRATE) Take 1 tablet by mouth once a day  #30 x 6   Entered and Authorized by:   Julaine Fusi  DO   Signed by:   Julaine Fusi  DO on 04/08/2010   Method used:   Electronically to        [facility] (retail)       [address]       [place], [place]  [address]       Ph: [phone]       Fax: [phone]   RxID:   [id number] ZESTRIL 10 MG TABS (LISINOPRIL) take 1 pill by mouth daily.  #30 x 3   Entered and Authorized by:   Julaine Fusi  DO   Signed by:   Julaine Fusi   DO on 04/08/2010   Method used:   Electronically to        [facility] (retail)       [address]       [place], [place]  [address]       Ph: [phone]       Fax: [phone]   RxID:   [id number] PRILOSEC 20 MG CPDR (OMEPRAZOLE) Take 1 tablet by mouth once a day  #30 x 2   Entered and Authorized by:   Julaine Fusi  DO   Signed by:   Julaine Fusi  DO on 04/08/2010   Method used:   Electronically to        [facility] (retail)       [address]       [place], [place]  [address]       Ph: [phone]       Fax: [phone]   RxID:   [id number]   Prevention & Chronic Care Immunizations   Influenza vaccine: Fluvax Non-MCR  (10/08/2009)   Influenza vaccine deferral: Deferred  (09/23/2009)    Tetanus booster: Not documented   Td booster deferral: Deferred  (01/19/2010)    Pneumococcal vaccine: Not documented  Colorectal Screening   Hemoccult: Not documented   Hemoccult action/deferral: Deferred  (08/14/2009)    Colonoscopy: Not documented   Colonoscopy action/deferral: Deferred  (08/14/2009)  Other Screening   PSA: Not documented   Smoking status: quit  (04/08/2010)  Diabetes Mellitus   HgbA1C: 7.4  (01/19/2010)   HgbA1C action/deferral: Ordered  (08/14/2009)    Eye exam: No diabetic  retinopathy.     (08/27/2009)   Eye exam due: 08/2010    Foot exam: yes  (08/14/2009)   Foot exam action/deferral: Do today   High risk foot: Not documented   Foot care education: Done  (08/14/2009)    Urine microalbumin/creatinine ratio: 464.8  (01/02/2009)   Urine microalbumin action/deferral: Not indicated  Lipids   Total Cholesterol: 206  (01/21/2010)   Lipid panel action/deferral: Lipid Panel ordered   LDL: 139  (01/21/2010)   LDL Direct: Not documented   HDL: 35  (01/21/2010)   Triglycerides: 158  (01/21/2010)    SGOT (AST): 18  (01/21/2010)   BMP action: Ordered   SGPT (ALT): 25   (01/21/2010)   Alkaline phosphatase: 67  (01/21/2010)   Total bilirubin: 0.3  (01/21/2010)  Self-Management Support :   Personal Goals (by the next clinic visit) :     Personal A1C goal: 7  (08/14/2009)     Personal blood pressure goal: 130/80  (08/14/2009)     Personal LDL goal: 70  (09/23/2009)    Patient will work on the following items until the next clinic visit to reach self-care goals:     Medications and monitoring: check my blood sugar, bring all of my medications to every visit  (04/08/2010)     Eating: drink diet soda or water instead of juice or soda, eat more vegetables, use fresh or frozen vegetables, eat foods that are low in salt, eat baked foods instead of fried foods, eat fruit for snacks and desserts  (04/08/2010)     Activity: take a 30 minute walk every day  (08/14/2009)     Other: also lifts weights  (08/14/2009)    Diabetes self-management support: Written self-care plan  (04/08/2010)   Diabetes care plan printed   Last diabetes self-management training by diabetes educator: 09/13/2007   Last medical nutrition therapy: 06/24/2008    Lipid self-management support: Written self-care plan  (04/08/2010)   Lipid self-care plan printed.   Vicodin Rx. 5/500mg  1-2 tabs by mouth two times a day as needed  pain  called to [facility] on [address] per Dr.Golding  Tyrone Pester RN  Apr 08, 2010 5:20 PM

## 2010-12-29 NOTE — Progress Notes (Signed)
Summary: med refill/gp  Phone Note Refill Request Message from:  Fax from Pharmacy on September 15, 2010 11:45 AM  Refills Requested: Medication #1:  TRIAMCINOLONE ACETONIDE 0.1 %  CREA Apply to scalp and ear once a day   Last Refilled: 06/11/2010  Method Requested: Electronic Initial call taken by: Chinita Pester RN,  September 15, 2010 11:45 AM  Follow-up for Phone Call        Rx faxed to pharmacy Follow-up by: Mariea Stable MD,  September 15, 2010 12:13 PM    Prescriptions: TRIAMCINOLONE ACETONIDE 0.1 %  CREA (TRIAMCINOLONE ACETONIDE) Apply to scalp and ear once a day  #1 month x 2   Entered and Authorized by:   Mariea Stable MD   Signed by:   Mariea Stable MD on 09/15/2010   Method used:   Electronically to        [facility] (retail)       [address]       [place], [place]  [address]       Ph: [phone]       Fax: [phone]   RxID:   [id number]

## 2010-12-29 NOTE — Progress Notes (Signed)
Summary: refill/gg  Phone Note Refill Request  on February 18, 2010 10:25 AM  Refills Requested: Medication #1:  METFORMIN HCL 1000 MG  TABS Take 1 tablet by mouth two times a day   Last Refilled: 12/17/2009  Method Requested: Electronic Initial call taken by: Merrie Roof RN,  February 18, 2010 10:26 AM  Follow-up for Phone Call        Rx faxed to pharmacy Follow-up by: Mariea Stable MD,  February 18, 2010 6:35 PM    Prescriptions: METFORMIN HCL 1000 MG  TABS (METFORMIN HCL) Take 1 tablet by mouth two times a day  #60 x 11   Entered and Authorized by:   Mariea Stable MD   Signed by:   Mariea Stable MD on 02/18/2010   Method used:   Electronically to        [facility] (retail)       [address]       [place], [place]  [address]       Ph: [phone]       Fax: [phone]   RxID:   [id number]

## 2010-12-29 NOTE — Progress Notes (Signed)
Summary: Refill/gh  Phone Note Refill Request Message from:  Fax from Pharmacy on August 31, 2010 12:36 PM  Refills Requested: Medication #1:  GLIPIZIDE 5 MG XR24H-TAB Take 1 tablet by mouth once a day   Last Refilled: 07/21/2010  Method Requested: Electronic Initial call taken by: Angelina Ok RN,  August 31, 2010 12:37 PM  Follow-up for Phone Call        Rx faxed to pharmacy Follow-up by: Mariea Stable MD,  September 01, 2010 8:09 AM    Prescriptions: GLIPIZIDE 5 MG XR24H-TAB (GLIPIZIDE) Take 1 tablet by mouth once a day  #31 x 11   Entered and Authorized by:   Mariea Stable MD   Signed by:   Mariea Stable MD on 09/01/2010   Method used:   Electronically to        [facility] (retail)       [address]       [place], [place]  [address]       Ph: [phone]       Fax: [phone]   RxID:   [id number]

## 2010-12-29 NOTE — Assessment & Plan Note (Signed)
Summary: CHECKUP/SB.   Vital Signs:  Patient profile:   55 year old male Height:      75 inches (190.50 cm) Weight:      272.01 pounds (123.64 kg) BMI:     34.12 Temp:     98.6 degrees F (37.00 degrees C) oral Pulse rate:   67 / minute BP sitting:   114 / 66  (right arm) Cuff size:   large  Vitals Entered By: Angelina Ok RN (September 16, 2010 9:22 AM) CC: Refills on meds.  Pain med refill.  Chest middle stomach pain.  Usually in the am.  Heart burn.  Hurts bad. Eats late at night. Maalox, pills Is Patient Diabetic? Yes Did you bring your meter with you today? No Pain Assessment Patient in pain? yes     Location: , left elbow, tooth Intensity: 4 Type: aching Onset of pain  Constant Nutritional Status BMI of > 30 = obese CBG Result 170  Have you ever been in a relationship where you felt threatened, hurt or afraid?No   Does patient need assistance? Functional Status Self care Ambulation Normal   Primary Care Everrett Lacasse:  Mariea Stable MD  CC:  Refills on meds.  Pain med refill.  Chest middle stomach pain.  Usually in the am.  Heart burn.  Hurts bad. Eats late at night. Maalox and pills.  History of Present Illness: Mr Houseworth is a 55 yo man with PMH as outlined below.  He is here for medication refills.  Reports getting left elbow pain.  He had right elbow tab 05/2007.  Says his Left elbow has been hurting for about 1 week.  No redness or swelling or heat.  Also reports chronic bilateral knee pain.    Continues to have heartburn.  Taking prilosec.  Says its worse in the mornings.  Brought on by drinking as well.    Depression History:      The patient denies a depressed mood most of the day and a diminished interest in his usual daily activities.         Preventive Screening-Counseling & Management  Alcohol-Tobacco     Alcohol drinks/day: 0     Smoking Status: quit     Year Quit: 2003  Current Medications (verified): 1)  Aspirin 81 Mg Tbec (Aspirin) .... Take 1  Tablet By Mouth Once A Day 2)  Metformin Hcl 1000 Mg  Tabs (Metformin Hcl) .... Take 1 Tablet By Mouth Two Times A Day 3)  Imdur 60 Mg Tb24 (Isosorbide Mononitrate) .... Take 1 Tablet By Mouth Once A Day 4)  Sotalol Hcl 80 Mg Tabs (Sotalol Hcl) .... Take 1/2 Tablet By Mouth Every 12 Hours 5)  Nitroquick 0.4 Mg Subl (Nitroglycerin) .... Take Sublingually As Needed With A Daily Maximum of 3 Tablets 6)  Triamcinolone Acetonide 0.1 %  Crea (Triamcinolone Acetonide) .... Apply To Scalp and Ear Once A Day 7)  Truetrack Test   Strp (Glucose Blood) .... Use To Test Blood Glucsoe 2-3x Daily 8)  Lancets   Misc (Lancets) .... Use To Test Blood Glucsoe 2-3x Daily 9)  Zestril 10 Mg Tabs (Lisinopril) .... Take 1 Pill By Mouth Daily. 10)  Pravachol 40 Mg Tabs (Pravastatin Sodium) .... Take 1 Tablet By Mouth Once A Day 11)  Vicodin 5-500 Mg Tabs (Hydrocodone-Acetaminophen) .... Take One Pill Every 6 Hours As Needed For Elbow Pain. 12)  Glipizide 5 Mg Xr24h-Tab (Glipizide) .... Take 1 Tablet By Mouth Once A Day 13)  Claritin  10 Mg Tabs (Loratadine) .... Take 1 Tablet By Mouth Once A Day 14)  Flonase 50 Mcg/act Susp (Fluticasone Propionate) .... Use 2 Sprays in Each Nostril Prior To Bedtime 15)  Prilosec 20 Mg Cpdr (Omeprazole) .... Take 1 Tablet By Mouth Once A Day  Allergies (verified): No Known Drug Allergies  Past History:  Past Medical History: Last updated: 06-30-08 Coronary artery disease (S/P PTCA with stent placement in 2003). Diabetes mellitus, type II (diagnosed in 2004). Hyperlipidemia. Tobacco Abuse. Testicular Hypogonadism. Atrial fibrillation:  follows Dr Sharyn Lull. Alcohol Abuse: in remission:  quit 3 years ago. Right foot pain and swelling. Right elbow pain. Nausea, diaphoresis.  Past Surgical History: Last updated: 11/25/2006 PTCA/stent placement 2003  Family History: Last updated: 2008-06-30 Mom- died of ? uterine cancer at 56 yrs. Dad died in his 83's- unknown medical  problems. 2 Brothers- healthy 2 Sisters- healthy.  Social History: Last updated: 2008-06-30 Former Smoker. Works as a Education administrator. Married, no kids. Studied upto 12th grade.  Risk Factors: Smoking Status: quit (09/16/2010)  Review of Systems      See HPI  Physical Exam  General:  alert, well-developed, and well-nourished.   Eyes:  vision grossly intact, pupils equal, pupils round, and pupils reactive to light.  anicteric Lungs:  normal respiratory effort and no accessory muscle use.  normal respiratory effort and no accessory muscle use.   Msk:  no swelling, redness or warmth over right elbow.  mild tenderness over lateral epicondyle. Neurologic:  alert & oriented X3 and gait normal.   Psych:  Oriented X3, memory intact for recent and remote, and normally interactive.  Oriented X3, memory intact for recent and remote, and normally interactive.    Diabetes Management Exam:    Foot Exam (with socks and/or shoes not present):       Sensory-Monofilament:          Left foot: normal          Right foot: normal   Impression & Recommendations:  Problem # 1:  GERD (ICD-530.81) ? component of esophageal spasm will increase to prilosec two times a day.  His updated medication list for this problem includes:    Prilosec 20 Mg Cpdr (Omeprazole) .Marland Kitchen... Take 1 tablet by mouth two times a day  Problem # 2:  ELBOW PAIN (ICD-719.42) ? tennis elbow pt has used vicodin in past with good results for knee pain. no red flags, will give short supply and advise to continue with ibuprofen  Problem # 3:  DIABETES MELLITUS, TYPE II (ICD-250.00)  improved continue current regimen  His updated medication list for this problem includes:    Aspirin 81 Mg Tbec (Aspirin) .Marland Kitchen... Take 1 tablet by mouth once a day    Metformin Hcl 1000 Mg Tabs (Metformin hcl) .Marland Kitchen... Take 1 tablet by mouth two times a day    Zestril 10 Mg Tabs (Lisinopril) .Marland Kitchen... Take 1 pill by mouth daily.    Glipizide 5 Mg Xr24h-tab  (Glipizide) .Marland Kitchen... Take 1 tablet by mouth once a day  Orders: T- Capillary Blood Glucose (98119) T-Hgb A1C (in-house) (14782NF)  Labs Reviewed: Creat: 0.80 (01/21/2010)     Last Eye Exam: No diabetic retinopathy.    (08/27/2009) Reviewed HgBA1c results: 7.0 (09/16/2010)  7.4 (01/19/2010)  His updated medication list for this problem includes:    Aspirin 81 Mg Tbec (Aspirin) .Marland Kitchen... Take 1 tablet by mouth once a day    Metformin Hcl 1000 Mg Tabs (Metformin hcl) .Marland Kitchen... Take 1 tablet by mouth  two times a day    Zestril 10 Mg Tabs (Lisinopril) .Marland Kitchen... Take 1 pill by mouth daily.    Glipizide 5 Mg Xr24h-tab (Glipizide) .Marland Kitchen... Take 1 tablet by mouth once a day  Problem # 4:  HYPERLIPIDEMIA (ICD-272.4) LDL not at goal as of last time reports taking pravastatin does not have insurance, will continue for now  His updated medication list for this problem includes:    Pravachol 40 Mg Tabs (Pravastatin sodium) .Marland Kitchen... Take 1 tablet by mouth once a day  Labs Reviewed: SGOT: 18 (01/21/2010)   SGPT: 25 (01/21/2010)   HDL:35 (01/21/2010), 47 (01/09/2009)  LDL:139 (01/21/2010), 131 (01/09/2009)  Chol:206 (01/21/2010), 210 (01/09/2009)  Trig:158 (01/21/2010), 158 (01/09/2009)  Complete Medication List: 1)  Aspirin 81 Mg Tbec (Aspirin) .... Take 1 tablet by mouth once a day 2)  Metformin Hcl 1000 Mg Tabs (Metformin hcl) .... Take 1 tablet by mouth two times a day 3)  Imdur 60 Mg Tb24 (Isosorbide mononitrate) .... Take 1 tablet by mouth once a day 4)  Sotalol Hcl 80 Mg Tabs (Sotalol hcl) .... Take 1/2 tablet by mouth every 12 hours 5)  Nitroquick 0.4 Mg Subl (Nitroglycerin) .... Take sublingually as needed with a daily maximum of 3 tablets 6)  Triamcinolone Acetonide 0.1 % Crea (Triamcinolone acetonide) .... Apply to scalp and ear once a day 7)  Truetrack Test Strp (Glucose blood) .... Use to test blood glucsoe 2-3x daily 8)  Lancets Misc (Lancets) .... Use to test blood glucsoe 2-3x daily 9)  Zestril 10  Mg Tabs (Lisinopril) .... Take 1 pill by mouth daily. 10)  Pravachol 40 Mg Tabs (Pravastatin sodium) .... Take 1 tablet by mouth once a day 11)  Vicodin 5-500 Mg Tabs (Hydrocodone-acetaminophen) .... Take one pill every 6 hours as needed for elbow pain. 12)  Glipizide 5 Mg Xr24h-tab (Glipizide) .... Take 1 tablet by mouth once a day 13)  Claritin 10 Mg Tabs (Loratadine) .... Take 1 tablet by mouth once a day 14)  Flonase 50 Mcg/act Susp (Fluticasone propionate) .... Use 2 sprays in each nostril prior to bedtime 15)  Prilosec 20 Mg Cpdr (Omeprazole) .... Take 1 tablet by mouth two times a day  Other Orders: Tdap => 22yrs IM ([address]) Admin 1st Vaccine ([address])  Patient Instructions: 1)  Please schedule a follow-up appointment in 3 months. 2)  Doing well overall.   3)  Will give you a small supply of the vicodin for the pain.  4)  Would consider taking ibuprofen 400-600mg  three times a day with meals for a few days as well to help with pain. 5)  Remember to call Dr. Annitta Jersey office to refill the sotalol. 6)  If you have any problems before your next visit, call clinic. 7)  Increase prilosec to two times a day. Prescriptions: VICODIN 5-500 MG TABS (HYDROCODONE-ACETAMINOPHEN) Take one pill every 6 hours as needed for elbow pain.  #30 x 0   Entered and Authorized by:   Mariea Stable MD   Signed by:   Mariea Stable MD on 09/16/2010   Method used:   Print then Give to Patient   RxID:   [id number] PRILOSEC 20 MG CPDR (OMEPRAZOLE) Take 1 tablet by mouth two times a day  #60 x 3   Entered and Authorized by:   Mariea Stable MD   Signed by:   Mariea Stable MD on 09/16/2010   Method used:   Electronically to        [facility] (retail)       [address]       [place], [place]  [address]       Ph: [phone]       Fax: [phone]   RxID:   [id number] IMDUR 60 MG TB24 (ISOSORBIDE MONONITRATE) Take 1 tablet by mouth once a day  #30 x 6    Entered and Authorized by:   Mariea Stable MD   Signed by:   Mariea Stable MD on 09/16/2010   Method used:   Electronically to        [facility] (retail)       [address]       [place], [place]  [address]       Ph: [phone]       Fax: [phone]   RxID:   [id number] GLIPIZIDE 5 MG XR24H-TAB (GLIPIZIDE) Take 1 tablet by mouth once a day  #31 x 11   Entered and Authorized by:   Mariea Stable MD   Signed by:   Mariea Stable MD on 09/16/2010   Method used:   Electronically to        [facility] (retail)       [address]       [place], [place]  [address]       Ph: [phone]       Fax: [phone]   RxID:   [id number] ZESTRIL 10 MG TABS (LISINOPRIL) take 1 pill by mouth daily.  #30 x 3   Entered and Authorized by:   Mariea Stable MD   Signed by:   Mariea Stable MD on 09/16/2010   Method used:   Electronically to        [facility] (retail)       [address]       [place], [place]  [address]       Ph: [phone]       Fax: [phone]   RxID:   [id number]    Orders Added: 1)  T- Capillary Blood Glucose [82948] 2)  T-Hgb A1C (in-house) [83036QW] 3)  Tdap => 33yrs IM [[address]] 4)  Admin 1st Vaccine [90471] 5)  Est. Patient Level III [67893]   Immunization History:  Influenza Immunization History:    Influenza:  historical (09/02/2010)  Immunizations Administered:  Tetanus Vaccine:    Vaccine Type: Tdap    Site: right deltoid    Mfr: GlaxoSmithKline    Dose: 0.5 ml    Route: IM    Given by: Hessie Diener, Student Nurse/Gladys Herbin, RN    Exp. Date: 09/17/2012    Lot #: [id number]    VIS given: 10/16/08 version given September 16, 2010.   Immunization History:  Influenza Immunization History:    Influenza:  Historical (09/02/2010)  Immunizations Administered:  Tetanus Vaccine:    Vaccine Type: Tdap    Site: right deltoid     Mfr: GlaxoSmithKline    Dose: 0.5 ml    Route: IM    Given by: Hessie Diener, Student Nurse/Gladys Herbin, RN    Exp. Date: 09/17/2012    Lot #: [id number]    VIS given: 10/16/08 version given September 16, 2010.  Prevention & Chronic Care Immunizations   Influenza vaccine: Historical  (09/02/2010)   Influenza  vaccine deferral: Deferred  (09/23/2009)    Tetanus booster: 09/16/2010: Tdap   Td booster deferral: Deferred  (01/19/2010)    Pneumococcal vaccine: Not documented  Colorectal Screening   Hemoccult: Not documented   Hemoccult action/deferral: Deferred  (08/14/2009)    Colonoscopy: Not documented   Colonoscopy action/deferral: Deferred  (08/14/2009)  Other Screening   PSA: Not documented   Smoking status: quit  (09/16/2010)  Diabetes Mellitus   HgbA1C: 7.0  (09/16/2010)   HgbA1C action/deferral: Ordered  (08/14/2009)    Eye exam: No diabetic retinopathy.     (08/27/2009)   Eye exam due: 08/2010    Foot exam: yes  (09/16/2010)   Foot exam action/deferral: Do today   High risk foot: Not documented   Foot care education: Done  (08/14/2009)    Urine microalbumin/creatinine ratio: 464.8  (01/02/2009)   Urine microalbumin action/deferral: Not indicated    Diabetes flowsheet reviewed?: Yes   Progress toward A1C goal: Improved  Lipids   Total Cholesterol: 206  (01/21/2010)   Lipid panel action/deferral: Lipid Panel ordered   LDL: 139  (01/21/2010)   LDL Direct: Not documented   HDL: 35  (01/21/2010)   Triglycerides: 158  (01/21/2010)    SGOT (AST): 18  (01/21/2010)   BMP action: Ordered   SGPT (ALT): 25  (01/21/2010)   Alkaline phosphatase: 67  (01/21/2010)   Total bilirubin: 0.3  (01/21/2010)    Lipid flowsheet reviewed?: Yes   Progress toward LDL goal: Unchanged  Self-Management Support :   Personal Goals (by the next clinic visit) :     Personal A1C goal: 7  (08/14/2009)     Personal blood pressure goal: 130/80  (08/14/2009)     Personal LDL goal:  70  (09/23/2009)    Patient will work on the following items until the next clinic visit to reach self-care goals:     Medications and monitoring: take my medicines every day, check my blood sugar, examine my feet every day  (09/16/2010)     Eating: drink diet soda or water instead of juice or soda, eat more vegetables, use fresh or frozen vegetables, eat foods that are low in salt, eat baked foods instead of fried foods, eat fruit for snacks and desserts, limit or avoid alcohol  (09/16/2010)     Activity: take a 30 minute walk every day  (09/16/2010)     Other: also lifts weights  (08/14/2009)    Diabetes self-management support: Written self-care plan, Education handout, Pre-printed educational material, Resources for patients handout  (09/16/2010)   Diabetes care plan printed   Diabetes education handout printed   Last diabetes self-management training by diabetes educator: 09/13/2007   Last medical nutrition therapy: 06/24/2008    Lipid self-management support: Written self-care plan, Education handout, Pre-printed educational material, Resources for patients handout  (09/16/2010)   Lipid self-care plan printed.   Lipid education handout printed      Resource handout printed.   Nursing Instructions: Give tetanus booster today     Last LDL:                                                 139 (01/21/2010 6:52:00 PM)        Diabetic Foot Exam Foot Inspection Is there a history of a foot ulcer?  No Is there a foot ulcer now?              No Can the patient see the bottom of their feet?          Yes Are the shoes appropriate in style and fit?          Yes Is there swelling or an abnormal foot shape?          No Are the toenails long?                No Are the toenails thick?                Yes Are the toenails ingrown?              No Is there heavy callous build-up?              Yes Is there a claw toe deformity?                          No Is there elevated  skin temperature?            No Is there limited ankle dorsiflexion?            No Is there foot or ankle muscle weakness?            No Do you have pain in calf while walking?           No      Comments: Callus build up side of right great toe.  Callus at right foot ball side.   10-g (5.07) Semmes-Weinstein Monofilament Test Performed by: Angelina Ok RN          Right Foot          Left Foot Visual Inspection               Test Control      normal         normal Site 1         normal         normal Site 2         normal         normal Site 3         normal         normal Site 4         normal         normal Site 5         normal         normal Site 6         normal         normal Site 7         normal         normal Site 8         normal         normal Site 9         normal         normal Site 10         normal         normal  Impression      normal         normal  Laboratory Results   Blood Tests   Date/Time Received: September 16, 2010 9:40 AM  Date/Time Reported: Burke Keels  September 16, 2010 9:40 AM   HGBA1C: 7.0%   (Normal Range: Non-Diabetic -  3-6%   Control Diabetic - 6-8%) CBG Random:: 170mg /dL

## 2010-12-31 NOTE — Progress Notes (Signed)
Summary: refill/ hla  Phone Note Refill Request Message from:  Fax from Pharmacy on November 16, 2010 4:55 PM  Refills Requested: Medication #1:  ZESTRIL 10 MG TABS take 1 pill by mouth daily.   Dosage confirmed as above?Dosage Confirmed   Last Refilled: 11/22 last visit 10/19  Initial call taken by: Marin Roberts RN,  November 16, 2010 4:56 PM  Follow-up for Phone Call        Rx faxed to pharmacy Follow-up by: Mariea Stable MD,  November 17, 2010 11:56 AM    Prescriptions: ZESTRIL 10 MG TABS (LISINOPRIL) take 1 pill by mouth daily.  #30 x 3   Entered and Authorized by:   Mariea Stable MD   Signed by:   Mariea Stable MD on 11/17/2010   Method used:   Electronically to        [facility] (retail)       [address]       [place], [place]  [address]       Ph: [phone]       Fax: [phone]   RxID:   [id number]

## 2011-01-05 ENCOUNTER — Other Ambulatory Visit: Payer: Self-pay | Admitting: Internal Medicine

## 2011-01-05 ENCOUNTER — Encounter: Payer: Self-pay | Admitting: Internal Medicine

## 2011-01-05 MED ORDER — ISOSORBIDE MONONITRATE ER 60 MG PO TB24: 60.0000 mg | ORAL_TABLET | Freq: Every day | ORAL | Status: DC

## 2011-02-04 ENCOUNTER — Encounter: Payer: Self-pay | Admitting: Licensed Clinical Social Worker

## 2011-02-04 ENCOUNTER — Encounter: Payer: Self-pay | Admitting: Internal Medicine

## 2011-02-04 ENCOUNTER — Ambulatory Visit (INDEPENDENT_AMBULATORY_CARE_PROVIDER_SITE_OTHER): Payer: Self-pay | Admitting: Internal Medicine

## 2011-02-04 VITALS — BP 110/69 | HR 68 | Temp 97.6°F | Ht 75.0 in | Wt 282.3 lb

## 2011-02-04 DIAGNOSIS — F17200 Nicotine dependence, unspecified, uncomplicated: Secondary | ICD-10-CM

## 2011-02-04 DIAGNOSIS — K089 Disorder of teeth and supporting structures, unspecified: Secondary | ICD-10-CM

## 2011-02-04 DIAGNOSIS — Z720 Tobacco use: Secondary | ICD-10-CM

## 2011-02-04 DIAGNOSIS — K0889 Other specified disorders of teeth and supporting structures: Secondary | ICD-10-CM

## 2011-02-04 DIAGNOSIS — E119 Type 2 diabetes mellitus without complications: Secondary | ICD-10-CM

## 2011-02-04 LAB — POCT GLYCOSYLATED HEMOGLOBIN (HGB A1C): Hemoglobin A1C: 7.3

## 2011-02-04 LAB — GLUCOSE, CAPILLARY: Glucose-Capillary: 180 mg/dL — ABNORMAL HIGH (ref 70–99)

## 2011-02-04 MED ORDER — AMOXICILLIN 500 MG PO CAPS: 500.0000 mg | ORAL_CAPSULE | Freq: Three times a day (TID) | ORAL | Status: AC

## 2011-02-04 MED ORDER — HYDROCODONE-ACETAMINOPHEN 5-500 MG PO TABS: 1.0000 | ORAL_TABLET | Freq: Four times a day (QID) | ORAL | Status: DC | PRN

## 2011-02-04 NOTE — Progress Notes (Signed)
30 minutes.  Social Work.  Met with patient for smoking cessation counseling and connection to the Quitline.  [name] has been smoking since his teen years.  He quit for about two years and recently started up again over the last few months.  He smokes about 1/2 pack a day.  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   The last time he quit, he didn't use any medication.  He is not interested in Chantix because of the side effects but is receptive to using the patches especially if he can get them free of charge.   He would like to quit within the next 30 days.   I've given him our tip sheet and advised him to make his home environment and car smoke free.  We talked about various substitutes he can use in place of cigarettes and he does use suckers now.  His wife is a nonsmoker and will support him in his endeavor to quit smoking.   We talked about exercise and walking as well as eating healthy snacks like fruits and vegetables as he was concerned about gaining weight.  I also discussed the Quitline with [name] and that they are offering free patches if he signs up for their telephone counseling service. [name] is receptive to all of the information given toda including the Quitline.  The plan is for the patient to immediately connect to the Quitline for counseling and nicotine patches.  I've encouraged the patient to call me with any other questions or concerns.

## 2011-02-04 NOTE — Progress Notes (Signed)
  Subjective:    Patient ID: [name], male    DOB: May 02, 1956, 55 y.o.   MRN: [id number]  HPI  Has isolated concern of toothache, left lower incisor  Review of Systems No fevers chills    Objective:   Physical Exam    carious and eroded left lower incisor    Assessment & Plan:   Tooth needs to come out: [facility] in [place] on 3/22 Advised to get there early-very very early in the day  Amoxil 500 tid 7 day Ultram 50 tid prn

## 2011-02-04 NOTE — Assessment & Plan Note (Signed)
Meds as noted Referral as noted-given a hand out with information on the clinic

## 2011-02-05 ENCOUNTER — Other Ambulatory Visit: Payer: Self-pay | Admitting: *Deleted

## 2011-02-05 ENCOUNTER — Telehealth: Payer: Self-pay | Admitting: *Deleted

## 2011-02-05 NOTE — Telephone Encounter (Signed)
Call from pt said that he was asked what pharmacy he was using so that a prescription for Ultram can be called in.  Pt uses [facility] on [address].[name]

## 2011-02-05 NOTE — Telephone Encounter (Signed)
Called script from dr Reche Dixon in vicodin 5/500 #20 to [facility] [place]

## 2011-02-05 NOTE — Telephone Encounter (Signed)
Pt picked up the prescription for the Vicodin on yesterday.  Spoke with Dr. Reche Dixon pt was asked about taking Ultram for his tooth pain.  Pt said that Ultram did not help.    Pt was given the Vicodin instead.  This was relayed to the pt.  Pt said ok.  Pt voiced an understanding of what had occurred.

## 2011-02-05 NOTE — Telephone Encounter (Signed)
I looked up this patient in the [facility] [place] drug database- it looks like he is not getting any controlled meds from [place] at all even though we are prescribing them. Can we call his pharmacy and see when he last filled his medication?

## 2011-02-10 LAB — GLUCOSE, CAPILLARY: Glucose-Capillary: 170 mg/dL — ABNORMAL HIGH (ref 70–99)

## 2011-02-16 LAB — GLUCOSE, CAPILLARY: Glucose-Capillary: 124 mg/dL — ABNORMAL HIGH (ref 70–99)

## 2011-03-01 ENCOUNTER — Other Ambulatory Visit: Payer: Self-pay | Admitting: *Deleted

## 2011-03-02 MED ORDER — METFORMIN HCL 1000 MG PO TABS: 1000.0000 mg | ORAL_TABLET | Freq: Two times a day (BID) | ORAL | Status: DC

## 2011-03-02 MED ORDER — PRAVASTATIN SODIUM 40 MG PO TABS: 40.0000 mg | ORAL_TABLET | Freq: Every day | ORAL | Status: DC

## 2011-03-08 LAB — GLUCOSE, CAPILLARY: Glucose-Capillary: 183 mg/dL — ABNORMAL HIGH (ref 70–99)

## 2011-03-16 LAB — GLUCOSE, CAPILLARY: Glucose-Capillary: 137 mg/dL — ABNORMAL HIGH (ref 70–99)

## 2011-03-29 ENCOUNTER — Other Ambulatory Visit: Payer: Self-pay | Admitting: Internal Medicine

## 2011-03-30 NOTE — Telephone Encounter (Signed)
If I reviewed correctly, patient has not had a metabolic panel done since last year.  Will need labs next office visit.

## 2011-04-16 NOTE — Op Note (Signed)
NAME:  [name], [name]                            ACCOUNT NO.:  [id number]   MEDICAL RECORD NO.:  [id number]                   PATIENT TYPE:  INP   LOCATION:  6532                                 FACILITY:  [facility]   PHYSICIAN:  Mohan N. Sharyn Lull, M.D.              DATE OF BIRTH:  [birth year]   DATE OF PROCEDURE:  07/25/2003  DATE OF DISCHARGE:                                 OPERATIVE REPORT   PROCEDURES:  1. Left cardiac catheterization and selective left and right coronary     angiography, left ventriculography through the right groin using Judkins     technique.  2. Successful PTCA to proximal OM3 using 2.5 x10 mm long CrossSail balloon.  3. Successful deployment of 3.0 x 13 mm long Cypher drug eluting stent in     proximal OM3.   INDICATIONS FOR PROCEDURE:  [name] is a 55 year old black male with a past  medical history significant for labile hypertension, tobacco abuse, recently  noted to have elevated blood sugar.  He came to the emergency room  complaining of retrosternal chest pain described as crushing pain, grade  10/10 associated with mild shortness of breath, nausea and diaphoresis.  The  patient received sublingual nitroglycerin and aspirin with relief of chest  pain.  EKG done in the ER showed normal sinus rhythm with ST-T wave changes  in inferior and anterolateral leads.  The patient also gives a history of  exertional chest pain while climbing stairs.  Denies PND, orthopnea, leg  swelling.  Denies palpitations, lightheadedness or syncope.   PAST MEDICAL HISTORY:  As above.   PAST SURGICAL HISTORY:  He had cyst resections from the left shoulder and  the back.   ALLERGIES:  No known drug allergies.   CURRENT MEDICATIONS:  None.   SOCIAL HISTORY:  He is married, born in [place], lives in [place],  smokes one pack per day for 10 years.  Drinks whiskey occasionally socially.  He works as a [name].   FAMILY HISTORY:  Negative for coronary artery  disease.   PHYSICAL EXAMINATION:  GENERAL: On examination, he was alert, awake,  oriented x3 in no acute distress.  Hemodynamically he was stable, sinus  rhythm on the monitor.  HEENT: Conjunctivae was pink.  NECK: Supple.  No JVD, no bruits.  LUNGS: Clear to auscultation without rhonchi or rales.  CARDIOVASCULAR: S1 and S2 was normal.  There was no S3, gallop or murmur.  ABDOMEN: Soft, bowel sounds were present.  Nontender.  EXTREMITIES: There is no cyanosis, clubbing or edema.   LABORATORY DATA:  His EKG as stated, showed normal sinus rhythm with ST T  wave changes in the anterolateral and inferior leads.  His CPKs, two sets  were negative.  Troponin I was slightly elevated, 0.08 and 0.05.  The  patient underwent stress Cardiolite yesterday, exercised for six minutes on  Bruce protocol, complained of retrosternal  chest pain at peak of exercise  which relieved in the recovery phase.  Peak heart rate achieved was 137,  peak blood pressure was 177/60.  EKG showed at baseline, normal sinus rhythm  with ST depression in anterolateral leads at baseline and then further ST  depression were noted in the inferolateral leads up to 2 mm which converted  to its baseline.  Cardiolite scan showed no evidence of reversible ischemia,  but, due to his chest pain and positive Cardiolite by EKG criteria, the  patient was discussed at length regarding left catheter, possible PTCA  stenting, its risks, ie., death, stroke, need for emergency CABG, history of  restenosis, local vascular complications, etc., and consented for PCI.   DESCRIPTION OF PROCEDURE:  After obtaining the informed consent, the patient  was brought to the catheter laboratory and was placed on the fluoroscopic  table.  The right groin was prepped and draped in the usual fashion.  A 2%  Xylocaine was used for local anesthesia in the right groin. With the above  thin-walled needle, a 6 French arterial sheath was placed.  The sheath was   aspirated and flushed.  Next, a 6 French left Judkins catheter was advanced  over the wire under fluoroscopic guidance up to the ascending aorta.  The  wire was pulled out.  The catheter was aspirated and connected to the  manifold.  The catheter was further advanced and engaged into the left  coronary ostia.  Multiple views of the liver function tests system were  taken.  Next, the catheter was disengaged and was pulled out over the wire  and was replaced with a 6 French right Judkins cath which was advanced over  the wire under fluoroscopic guidance up to the ascending aorta.  The wire  was pulled out.  The catheter was aspirated and connected to the manifold.  The catheter was further advanced and engaged into the right coronary  ostium.  Multiple views of the right system were taken.  Next, the catheter  was disengaged and was pulled out over the wire and was replaced with a 6  French pigtail catheter which was advanced over the wire under fluoroscopic  guidance up to the ascending aorta.  The wire was pulled out.  The catheter  was aspirated and connected to the manifold.  The catheter was further  advanced across the aortic valve into the LV.  LV pressures were recorded.  Next, the LV graft was done in the 30 degree RAO position with cineographic  pressures recorded from the LV and then pulled back pressures were recorded  from the aorta.  There was no gradient across the aortic valve.  Next, the  pigtail catheter was pulled out over the wire.  She sheath was aspirated and  flushed.   FINDINGS:  1. LV showed good LV systolic function, EF of 50-55%.  2. Left main was patent.  There was 10-15%  proximal and mid stenosis.     Diagonal one was small which was patent.  Ramus was small which was     patent.  OM was very, very small, less than 1 mm.  OM2 was medium size     which was patent.  OM3 has proximal 90-95% stenosis. 3. RCA was patent proximally and had 10-15% mid stenosis.  4.  Successful PTCA to proximal OM3 was done using 2.5 x 10 mm long CrossSail     balloon for predilatation and then 3.0 x 13 mm long Cypher  drug eluting     stent was deployed in the proximal OM3 at 11 atmospheric pressure.  The     lesion was dilated from 90 to 95% to 0% residual with excellent TIMI     grade III and distal flow     without evidence of dissection or destabilization.  The patient received     weight-based heparin, Integrelin and 300 mg of Plavix during the     procedure.  The patient tolerated the procedure well.  There were no     complications.  The patient was transferred to the recovery room in     stable condition.                                               Eduardo Osier. Sharyn Lull, M.D.    MNH/MEDQ  D:  07/25/2003  T:  07/26/2003  Job:  [id number]   cc:   Catheter lab   Eino Farber., M.D.  [address] [place]  [place] [address]  Fax: [phone]

## 2011-04-16 NOTE — Cardiovascular Report (Signed)
NAME:  [name], [name]                            ACCOUNT NO.:  [id number]   MEDICAL RECORD NO.:  [id number]                   PATIENT TYPE:  INP   LOCATION:  3727                                 FACILITY:  [facility]   PHYSICIAN:  Eduardo Osier. Sharyn Lull, M.D.              DATE OF BIRTH:  06/30/1956   DATE OF PROCEDURE:  09/03/2003  DATE OF DISCHARGE:  09/04/2003                              CARDIAC CATHETERIZATION   PROCEDURES PERFORMED:  1. Left cardiac catheterization.  2. Selective left and right coronary angiography.  3. Left ventriculography via right groin using Judkins technique.   CARDIOLOGISTEduardo Osier Sharyn Lull, M.D.   INDICATIONS FOR THE PROCEDURE:  [name] is a 55 year old black male with a  past medical history significant for coronary artery disease status post  PTCA and stenting to OM-3, hypertension, noninsulin-dependent diabetes  mellitus, hypercholesterolemia, and tobacco abuse.  He came to the ER  complaining of retrosternal and precordial chest pain described as  tightness, graded 9/10, associated with palpitations, mild shortness of  breath and diaphoresis since last night off and on.  The patient states  chest pain is similar in nature to that when he was admitted in August 2004  when he had PTCA and stenting to the OM-3.  The patient denies any  noncompliance with medication, but continues to smoke more than one pack per  day.  Denies any cocaine abuse.  Denies PND, orthopnea,  but complains of  leg swelling.  He states his blood sugar has been running high.  The patient  denies any fever, chills, cough or urinary complaints.  EKG done in the ER  showed normal sinus rhythm with ST depression in the inferolateral leads,  which were more prominent than on prior EKG.  MI was ruled out by serial  enzymes and EKG.  Due to typical anginal chest pain and further ST  depression in the lateral leads I discussed with the patient regarding left  cath and possible PTCA and  stenting, its risks, i.e. MI, stroke, need for  emergency CABG, risks of restenosis, and death, and consented for PCI.   DESCRIPTION OF PROCEDURE:  After obtaining the informed consent the patient  was brought to the cath lab and was placed on the fluoroscopy table.  The  right groin was prepped and draped in the usual fashion.  Two percent  Xylocaine was used for local anesthesia in the right groin.  With the help  of a thin-walled needle a 6 French arterial sheath was placed.  The sheath  was aspirated and flushed.  Next, a 6 French left Judkins catheter was  advanced over the wire under fluoroscopic guidance up to the ascending  aorta.  The wire was pulled out and the catheter was aspirated, and  connected to the manifold.  Catheter was further advance and engaged into  the left coronary ostium.  Multiple views of  the left system were taken.  Next, the catheter was disengaged and was pulled out over the wire, and was  replaced with a 6 French right Judkins catheter, which was advanced over the  wire under fluoroscopic guidance up to the ascending aorta.  The wire was  pulled out.  The catheter was aspirated and connected to the manifold.  The  catheter was further advanced and engaged into the right coronary ostium.  Multiple views of the right system were taken.  Next, the catheter was  disengaged and was pulled out over the wire, and was replaced with a 6  French pigtail catheter, which was advanced over the wire under fluoroscopic  guidance up to the ascending aorta.  The wire was pulled out, the catheter  was aspirated and connected to the manifold.  The catheter was further  advanced across the aortic valve and into the LV.  LV pressures were  recorded.   Next, left ventriculography was done in the 30-degree RAO position.  Post  angiographic pressures were recorded from the LV and then pullback pressures  were recorded from the aorta.  There was no gradient across the aortic   valve.  Next, the pigtail catheter was pulled out over the wire, sheaths  were aspirated and flushed.   FINDINGS:  LV:  The LV showed good LV systolic function with an EF of 55-  60%.   Left Main Coronary Artery:  The left main was patent.   LAD:  The LAD has 10-15% proximal and mid stenosis.  Diagonal-1 is small,  which is patent.   Circumflex:  The left circumflex had spasm in the midportion, which was  relieved after giving intracoronary nitro, which was subsequently noted to  be patent.  The OM-1 was very, very small.  OM-2 has 20-30% ostial stenosis.  OM-3 has 30-40% ostial stenosis.  Stented portion in the proximal segment  appears to be widely patent.  There was some associated spasm in the ostium  of the OM-3, which was relieved with intracoronary nitro.   Ramus Intermedius:  The ramus had 20% ostial stenosis.   RCA:  The RCA has 10-15% mid stenosis.   The patient tolerated the procedure well.  There were no complications.  The  patient was transferred to the recovery room in stable condition.  The  patient will be discharged home this afternoon if hemodynamically stable.  We will add low-dose nitrates and will continue with the same medications as  before.  The patient has been strongly encouraged to quit smoking, which he  agrees.                                                 Eduardo Osier. Sharyn Lull, M.D.    MNH/MEDQ  D:  09/04/2003  T:  09/05/2003  Job:  [id number]   cc:   Catheterization Laboratory   Eino Farber., M.D.  [address] [place]  [place] [address]  Fax: [phone]

## 2011-04-16 NOTE — Cardiovascular Report (Signed)
NAME:  EDMUND, HOLCOMB NO.:  [id number]   MEDICAL RECORD NO.:  [id number]                   PATIENT TYPE:  INP   LOCATION:  2036                                 FACILITY:  [facility]   PHYSICIAN:  Eduardo Osier. Sharyn Lull, M.D.              DATE OF BIRTH:  Feb 24, 1956   DATE OF PROCEDURE:  12/02/2003  DATE OF DISCHARGE:                              CARDIAC CATHETERIZATION   PROCEDURE:  1. Left cardiac catheterization.  2. Selective left and right coronary angiography.  3. Left ventriculography via right groin using Judkins technique.   INDICATIONS FOR PROCEDURE:  Mr. Trevino is a 55 year old black male with past  medical history significant for hypertension, non-insulin-dependent diabetes  mellitus, hypercholesterolemia, coronary artery disease status post PTCA  stenting to OM3 in August of 2004.  Was admitted on November 30, 2003 by Dr.  Algie Coffer because of retrosternal chest pain which was partially relieved with  sublingual nitroglycerin.  Chest pain was grade 8/10.  The patient was  admitted to telemetry unit.  MI was ruled out by serial enzymes and EKG.  The patient subsequently underwent adenosine Cardiolite on December 01, 2003  by Dr. Algie Coffer which showed mid and distal anterior wall ischemia with  ejection fraction of 57%.  Due to typical anginal chest pain, history of  coronary artery disease, multiple risk factors, and positive adenosine  Cardiolite, discussed with patient regarding left catheterization, possible  PTCA and stenting, its risks, i.e., death, MI, stroke, need for emergency  CABG, risk of restenosis, local vascular complications, etc. and consented  for PCI.   PROCEDURE:  After obtaining informed consent patient was brought to the  catheterization laboratory and was placed on fluoroscopy table.  Right groin  was prepped and draped in usual fashion.  2% Xylocaine was used for local  anesthesia in the right groin.  With the help of thin wall needle  6-French  arterial sheath was placed.  The sheath was aspirated and flushed.  Next, 6-  French left Judkins catheter was advanced over the wire under fluoroscopic  guidance up to the ascending aorta where it was pulled out.  The catheter  was aspirated and connected to the manifold.  Catheter was further advanced  and engaged into left coronary ostium.  Multiple views of the left system  were taken.  Next, the catheter was disengaged and was pulled out over the  wire and was replaced with 6-French right Judkins catheter which was  advanced over the wire under fluoroscopic guidance up to the ascending aorta  where it was pulled out.  The catheter was aspirated and connected to the  manifold.  Catheter was further advanced and engaged into right coronary  ostium.  Multiple views of the right system were taken.  Next, the catheter  was disengaged and was pulled out over the wire and was replaced with 6-  French pigtail catheter which was  advanced over the wire under fluoroscopic  guidance up to the ascending aorta where it was pulled out.  The catheter  was aspirated and connected to the manifold.  Catheter was further advanced  across the aortic valve into the LV.  LV pressures were recorded.  Next, LV  graphy was done in 30 degree RAO position.  Post angiographic pressures were  recorded from LV and then pullback pressures were recorded from the aorta.  There was no gradient across the aortic valve.  Next, the pigtail catheter  was pulled out over the wire.  Sheaths were aspirated and flushed.   FINDINGS:  LV showed good LV systolic function, EF of 55-60%.  Left main was  patent.  LAD has 20-30% mid stenosis.  Diagonal 1 and diagonal 2 were  patent.  Left circumflex was patent.  OM1 was less than 0.5 mm which  was diffusely diseased.  OM2 has 20% ostial stenosis which was patent which  was moderate size.  OM3 was patent at prior PTCA and stented site.  RCA has  20-30% proximal stenosis and  10-15% mid stenosis.  The patient tolerated  procedure well.  There were no complications.  The patient was transferred  to recovery room in stable condition.                                               Eduardo Osier. Sharyn Lull, M.D.    MNH/MEDQ  D:  12/03/2003  T:  12/03/2003  Job:  [id number]   cc:   Cath Lab   Eino Farber., M.D.  [address] [place]  [place] [address]  Fax: [phone]

## 2011-04-16 NOTE — Discharge Summary (Signed)
NAME:  CLARION, MOONEYHAN                            ACCOUNT NO.:  [id number]   MEDICAL RECORD NO.:  [id number]                   PATIENT TYPE:  INP   LOCATION:  3707                                 FACILITY:  [facility]   PHYSICIAN:  Mohan N. Sharyn Lull, M.D.              DATE OF BIRTH:  February 11, 1956   DATE OF ADMISSION:  02/16/2004  DATE OF DISCHARGE:  02/21/2004                                 DISCHARGE SUMMARY   ADMISSION DIAGNOSES:  1. Chest pain.  2. New onset atrial fibrillation with rapid ventricular response, rule out     coronary insufficiency.  3. Hypertension.  4. Noninsulin-dependent diabetes mellitus.  5. Hypercholesterolemia.  6. History of tobacco abuse.  7. Coronary artery disease.   FINAL DIAGNOSES:  1. Stable angina.  2. Status post new onset atrial fibrillation with rapid ventricular response     converted to sinus rhythm.  3. Hypertension.  4. Insulin-dependent diabetes mellitus.  5. Hypercholesterolemia.  6. Coronary artery disease, status post percutaneous transluminal coronary     angiography and stenting to obtuse marginal.  Had recent catheterization     which was normal.  7. History of tobacco abuse.   DISCHARGE HOME MEDICATIONS:  1. Plavix 75 mg one tablet daily with food.  2. Coumadin 7.5 mg one tablet daily.  3. Imdur 60 mg one tablet daily.  4. Altace 5 mg one capsule daily.  5. Betapace AF 80 mg half of a tablet every 12 hours.  6. Lipitor 10 mg one tablet daily.  7. Avandia 4 mg one tablet daily.  8. Nitrostat 0.4 mg sublingual to use as directed.   ACTIVITY:  As tolerated.   DIET:  Low-salt, low-cholesterol, 1800 calorie ADA diet.   SPECIAL INSTRUCTIONS:  The patient has been advised to monitor blood sugar  daily and also watch for signs of bleeding.  If he notices that are any  black, tarry stools, feels weak or dizzy, or notices any blood in stool or  urine, he should report to the ER immediately.  PT on Tuesday, February 25, 2004.  Follow up with  me early next week.   CONDITION ON DISCHARGE:  Stable.   BRIEF HISTORY:  [name] is a 55 year old black male with a past  medical history significant for coronary artery disease, hypertension,  noninsulin-dependent diabetes mellitus, hypercholesterolemia, and history of  tobacco abuse.  He came to the ER complaining of chest pain associated with  palpitations and feeling weak and tired since this morning.  The patient  denies any nausea, vomiting, or diaphoresis.  Denies lightheadedness or  syncope.  Denies recent cocaine abuse.  The patient was noted to be in  atrial fibrillation with rapid ventricular response.  He received IV  Cardizem bolus and drip with control of ventricular rate in the 80s-90s.  Presently the patient denies any chest pain.  Denies any thyroid problems.  Denies  prolonged immobilization.  Denies any fever or chills.   PAST MEDICAL HISTORY:  As above.   PAST SURGICAL HISTORY:  None, except for PTCA and stenting in August of  2004.   MEDICATIONS:  At home, he is on:  1. Altace 5 mg p.o. daily.  2. Plavix 75 mg p.o. daily.  3. Imdur 60 mg p.o. q.a.m.  4. Lipitor 10 mg p.o. daily.  5. Toprol XL 25 mg p.o. daily.  6. Avandia 4 mg p.o. daily.  7. Baby aspirin 81 mg two p.o. daily.   SOCIAL HISTORY:  He is married.  He works as a Education administrator.  He smoked one pack  per day for 20+ years.  Quit two months ago.  Used to smoke cocaine in the  past.  No history of IVDA.   FAMILY HISTORY:  Noncontributory.   PHYSICAL EXAMINATION:  GENERAL APPEARANCE:  On examination, he was alert,  awake, and oriented x 3 in no acute distress.  VITAL SIGNS:  The blood pressure was 114/76 and pulse 80-90 and irregularly  irregular.  HEENT:  The conjunctivae were pink.  NECK:  Supple.  No JVD.  No bruits.  LUNGS:  Clear to auscultation without rhonchi.  CARDIOVASCULAR:  S1 and S2 were normal.  There was a soft systolic murmur.  ABDOMEN:  Soft.  Bowel sounds were present.   Nontender.  EXTREMITIES:  There was no cyanosis, clubbing, or edema.   LABORATORIES:  The EKG showed atrial fibrillation with rapid ventricular  response and ST-T wave changes in inferolateral leads.  The hemoglobin was  14, hematocrit 40.5.  The potassium was 3.3, which has been replaced.  His  blood sugar was 171 and magnesium 2.0.  Point of care CPKs and troponins  were negative.  His TSH was 1.22, which was in normal range.  The  cholesterol was 116, triglycerides 60, HDL 30, and LDL 54.  Repeat  electrolytes were sodium 141, potassium 3.9, chloride 108, bicarbonate 26,  blood sugar 151, BUN 14, and creatinine 0.8.  The hemoglobin A1C was  elevated.  It was 8.3.  His INR today was 1.4.  The hemoglobin yesterday was  13.5, hematocrit 39.8, and white count 7.4.   BRIEF HOSPITAL COURSE:  The patient was admitted to the telemetry unit.  MI  was ruled out by serial enzymes and EKG.  The patient recently had cardiac  catheterization due to slightly abnormal Cardiolite study.  Catheterization  showed no evidence of restenosis in the obtuse marginal.  The patient was  started on IV Cardizem, heparin, and Coumadin was also started on the same  day.  Betapace AF was added.  The patient subsequently converted into  regular sinus rhythm and remained in sinus rhythm during the hospital stay.  The patient received progressively increasing doses of Coumadin from 10-15  mg daily.  The patient remained in sinus rhythm.  Since conversion from  atrial fibrillation, the patient has been ambulating in the hallway without  any problems.  The patient  is eager to go home.  The patient is __________ for thromboembolism.  The  patient will be discharged home today and will be followed closely as an  outpatient.  The patient has been advised to get a pro time early next week  and will be followed up in my office next week and with Dr. Petra Kuba as  scheduled.  Eduardo Osier. Sharyn Lull, M.D.    MNH/MEDQ  D:  02/21/2004  T:  02/23/2004  Job:  [id number]   cc:   Eino Farber., M.D.  [address] [place]  [place] [address]  Fax: [phone]

## 2011-04-16 NOTE — Discharge Summary (Signed)
NAME:  [name], [name] NO.:  [id number]   MEDICAL RECORD NO.:  [id number]                   PATIENT TYPE:  INP   LOCATION:  2036                                 FACILITY:  [facility]   PHYSICIAN:  Eduardo Osier. Sharyn Lull, M.D.              DATE OF BIRTH:  1956/07/14   DATE OF ADMISSION:  11/29/2003  DATE OF DISCHARGE:  12/03/2003                                 DISCHARGE SUMMARY   ADMISSION DIAGNOSES:  1. Chest pain, rule out myocardial infarction.  2. Coronary artery disease.  3. Hypertension.  4. Type 2 diabetes mellitus.  5. Hypercholesterolemia.  6. Tobacco abuse.   FINAL DIAGNOSES:  1. Stable angina.  2. Mildly positive adenosine Cardiolite.  3. Status post left catheterization.  4. Coronary artery disease, stable.  5. Hypertension.  6. Non-insulin-dependent diabetes mellitus.  7. Hypercholesterolemia.  8. Tobacco abuse.   DISCHARGE HOME MEDICATIONS:  1. Baby aspirin 81 mg two tablets daily.  2. Plavix 75 mg one tablet daily with food.  3. Toprol XL 25 mg one tablet daily.  4. Altace 5 mg one capsule daily.  5. Imdur 30 mg one tablet daily in the morning.  6. Lipitor 10 mg one tablet daily.  7. Avandia 4 mg one tablet daily.  8. Nitrostat 0.4 mg sublingual use as directed.   ACTIVITY:  Avoid heavy lifting, pushing or pulling for 48 hours.   DIET:  Low salt, low cholesterol, 1800 calorie ADA diet.   Post cardiac catheterization instructions have been given.   FOLLOW UP:  Follow-up with me in one week and follow up with Dr. Petra Kuba  as scheduled.   HISTORY OF PRESENT ILLNESS:  [name] is a 55 year old black male with  past medical history significant for coronary artery disease, status post  PTCA stenting to OM3 in August of 2004, hypertension, non-insulin-dependent  diabetes mellitus, hypercholesterolemia, and tobacco abuse, and history of  alcohol abuse.  He was admitted by Dr. Algie Coffer on November 30, 2003, because  of retrosternal  chest pain, localized, which was partially relieved with  sublingual nitroglycerin.  The patient denies any nausea, vomiting, or  diaphoresis.  He denies shortness of breath.  He denies palpitations,  lightheadedness or syncope.   PAST MEDICAL HISTORY:  As above.   MEDICATIONS:  At home he was on:  1. Toprol XL 25 mg p.o. daily.  2. Lipitor 10 mg p.o. daily.  3. Imdur 30 mg p.o. daily.  4. Nitrostat sublingual p.r.n.  5. Avandia 4 mg p.o. daily.  6. Altace 5 mg p.o. daily.  7. Plavix 75 mg p.o. daily.  8. Baby aspirin 81 mg p.o. daily.   ALLERGIES:  No known drug allergies.   SOCIAL HISTORY:  He is married and has no children.  He smoked one pack per  day for 20+ years.  He use to drink alcohol but quit about one year  ago.   FAMILY HISTORY:  Mother died of CA of lung at the age of 22.  He has four  brothers and two sisters.  Father died of unknown cause.   PHYSICAL EXAMINATION:  GENERAL APPEARANCE:  He was awake, alert and oriented  x3.  VITAL SIGNS:  Blood pressure 137/73, pulse 76 and regular.  HEENT:  Conjunctiva pink.  NECK:  Supple.  No JVD.  LUNGS:  Clear bilaterally.  CARDIOVASCULAR:  S1 and S2 was normal.  ABDOMEN:  Soft, nontender.  EXTREMITIES:  No clubbing or cyanosis.  There was trace edema.   LABORATORY DATA:  EKG showed normal sinus rhythm with ST depression in  inferior leads.   Chest x-ray showed stable cardiomegaly with changes of COPD.   Three sets of cardiac enzymes were negative.  Blood sugar 133, BUN 19,  potassium 4.0, creatinine 0.9.  Hemoglobin 13, hematocrit 38.2, white count  5.6.   HOSPITAL COURSE:  The patient was admitted to telemetry unit.  MI was ruled  out by serial enzymes and EKG.  Subsequently, the patient underwent  adenosine Cardiolite on December 01, 2003, which showed mild area of ischemia  in the mid and distal anterior wall with EF of 57%.  Due to typical chest  pain and mildly positive adenosine Cardiolite, the patient  subsequently  underwent left cardiac catheterization with selective left and right  coronary angiography as per procedure report.  The patient tolerated the  procedure well.  There were no complications.  The patient did not have any  further episodes of chest pain during the hospital stay and the patient has  been ambulating  in the hallway without any problems.  His groin is stable.  There is no  evidence of hematoma or bruit.  The patient will be discharged home on the  above medications and will be followed in my office in one week and Dr.  Petra Kuba as scheduled.                                                Eduardo Osier. Sharyn Lull, M.D.    MNH/MEDQ  D:  12/03/2003  T:  12/03/2003  Job:  [id number]   cc:   Eino Farber., M.D.  [address] [place]  [place] [address]  Fax: [phone]

## 2011-04-16 NOTE — Discharge Summary (Signed)
NAME:  [name], [name] NO.:  [id number]   MEDICAL RECORD NO.:  [id number]                   PATIENT TYPE:  INP   LOCATION:  6532                                 FACILITY:  [facility]   PHYSICIAN:  Eino Farber., M.D.      DATE OF BIRTH:  February 08, 1956   DATE OF ADMISSION:  07/23/2003  DATE OF DISCHARGE:  07/26/2003                                 DISCHARGE SUMMARY   DISCHARGE DIAGNOSES:  1. Coronary artery disease with unstable angina.  2. Intermediate coronary artery syndrome.  3. Gastroesophageal reflux disease.  4. Acute and chronic bronchitis.  5. Hypertensive heart disease.  6. Tobacco abuse.  7. Diabetes mellitus type 2.  8. Hyperproteinemia with hypercholesterolemia.  9. Chronic maxillary sinusitis.   BRIEF HISTORY:  The patient is a 55 year old black male who was admitted  from the [facility] emergency department with complaint of severe  substernal anterior chest pain with acute onset, uncontrolled with  sublingual nitroglycerin and IV morphine.  Since the patient has a history  of chronic smoking tobacco use, as well as history of borderline  hypertension, he was further evaluated with cardiac evaluation.   CONSULTANTS:  Mohan N. Sharyn Lull, M.D.   PAST MEDICAL HISTORY:  The patient has unknown history of heart disease or  gallbladder disease.  He did have a history of gastroesophageal reflux  disease clinically.   REVIEW OF SYSTEMS:  Reveals the head, eyes, ears, nose, throat,  cardiopulmonary and neuromuscular system to be as noted.   ALLERGIES:  The patient had no known drug allergies.   PERTINENT PHYSICAL FINDING:  GENERAL:  The patient was observed to be a well-  developed, well-nourished black male, oriented x3, and appeared to have some  anterior chest pain.  VITAL SIGNS: On admission, temperature 97.5, pulse 63, and blood pressure of  126/70 to 144/85.  Respirations 20.  HEENT:  Revealed some nasal congestion,  otherwise unremarkable.  NECK:  Supple with no masses or bruits.  There was no thyroid enlargement.  LUNGS:  Reveal minimal subsegmental rhonchi bilaterally that decreased after  cough.  HEART:  Regular rhythm with no rubs.  ABDOMEN:  Minimal epigastric tenderness.  EXTREMITIES:  Good strength, no edema, good pulses.   LABORATORY DATA:  Hemoglobin 14.5, WBC count of 6300. The blood chemistries  revealed an elevated glucose of 185 mg percent which was of new onset.  The  glycosylated hemoglobin was 9.2, also consistent with new onset diabetes  mellitus, uncontrolled.  CK revealed a slight elevation of 250 units per  liter, and a troponin I that was mildly elevated to 0.08 nanograms per  milliliter with an upper limit of 0.04.  This was consistent with unstable  angina.  His repeat CK on July 24, 2003, was 168 with a troponin I of  0.05.   HOSPITAL COURSE:  The patient was seen on consultation by Dr. Eduardo Osier.  Harwani, cardiologist,  and a Cardiolite study was done on July 24, 2003,  which revealed a positive exercise stress test.  He then was arranged for  cardiac catheterization in which he was found to have coronary artery  disease with the obtuse margin number 3 being approximately 90-95% stenosed.  The left main revealed 10-15% proximal and mid stenosis.  The patient did  have balloon angioplasty of the obtuse marginal 3 branch that was 90 to 95%  occluded, and was opened to have 0 residual at the time of the procedure by  Dr. Sharyn Lull, cardiologist on the July 27, 2003, procedure date.  The  patient did well postoperatively, and was discharged on July 26, 2003.   DISCHARGE MEDICATIONS:  1. Avandia 4 mg tablets, one q.p.m. with his dinner meal.  2. Metoprolol 50 mg tablet, one-half tablet daily, or either Toprol XL 25 mg     daily.  3. Imdur 60 mg tablets one daily.  4. Plavix 75 mg tablets, one daily.  5. Aspirin 325 mg tablets one daily with food.  6. Lipitor 10 mg  tablets one daily.  7. Darvocet N 100, one q.4h. p.r.n. for pain.   DISCHARGE INSTRUCTIONS:  1. He is to ambulate as tolerated.  2. His diet is an 1800 calorie, ADA, low-fat, low-cholesterol diet with no     added salt.  3. He is to have follow up of his glucose checks with a glucose check     machine, and to notify the office if his Accu-Cheks are above 125 mg     percent, and will have further diabetic teaching at the nutrition and     di[facility] management center.  4. He is to have a followup appointment in the office with Mina Marble, August 26, 2003.  5. Follow up in Dr. Eduardo Osier. Harwani's office as directed.                                                Eino Farber., M.D.    GRK/MEDQ  D:  09/12/2003  T:  09/13/2003  Job:  [id number]   cc:   Eduardo Osier. Sharyn Lull, M.D.  [address]  [place]  [place] [address]  Fax: [phone]

## 2011-04-16 NOTE — Discharge Summary (Signed)
NAME:  [name], [name] NO.:  [id number]   MEDICAL RECORD NO.:  [id number]                   PATIENT TYPE:  INP   LOCATION:  2037                                 FACILITY:  [facility]   PHYSICIAN:  Eduardo Osier. Sharyn Lull, M.D.              DATE OF BIRTH:  1956-05-27   DATE OF ADMISSION:  03/12/2004  DATE OF DISCHARGE:  03/19/2004                                 DISCHARGE SUMMARY   ADMISSION DIAGNOSES:  1. Recurrent atrial fibrillation with rapid ventricular response.  2. Atypical chest pain, rule out coronary insufficiency.  3. Hypertension.  4. Coronary artery disease.  5. Non-insulin-dependent diabetes mellitus.  6. Hypercholesterolemia.  7. History of tobacco abuse.  8. Alcohol abuse.  9. History of cocaine abuse in the past.   FINAL DIAGNOSES:  1. Status post recurrent atrial fibrillation with rapid ventricular     response.  2. Coronary artery disease.  3. Hypertension.  4. Non-insulin-dependent diabetes mellitus.  5. Hypercholesterolemia.  6. History of tobacco abuse.  7. Alcohol abuse.  8. History of cocaine abuse in the past.   DISCHARGE MEDICATIONS:  1. Plavix 75 mg 1 tablet daily with food.  2. Coumadin 10 mg 1 tablet daily.  3. Imdur 60 mg 1 tablet daily.  4. Altace 5 mg 1 capsule daily.  5. Betapace AF 80 mg 1/2 tablet every 12 hours.  6. Lipitor 10 mg 1 tablet daily.  7. Avandia 4 mg 1 tablet daily.  8. Nitrostat 0.4 mg sublingual.  Use as directed.   ACTIVITY:  As tolerated.   DIET:  Low-salt, low-cholesterol, 1800 calorie, ADA diet.   DISCHARGE INSTRUCTIONS:  1. The patient has been advised to avoid using alcohol and smoking.  2. The patient has been advised to monitor blood sugar, CBC, and pro time in     one week.  3. Follow up with me in one week.   CONDITION ON DISCHARGE:  Stable.   BRIEF HISTORY:  [name] is a 55 year old black man with past medical  history significant for coronary artery disease, status post  PTCA and  stenting to obtuse marginal in August 2004, hypertension, non-insulin-  dependent diabetes mellitus, history of atrial fibrillation with rapid  ventricular response, recently discharged from the hospital,  hypercholesterolemia, and tobacco abuse.  He came to the ER complaining of  palpitations associated with chest pain while at rest.  The patient denies  any nausea, vomiting, diaphoresis.  Denies shortness of breath.  Denies  lightheadedness or syncope.  The patient is noncompliant to medications and  was noted to have INR of 1.1 despite increasing the dose of Coumadin.  Also  was noted to have alcohol level of 111.  The patient was noted to be in  atrial fibrillation with rapid ventricular response.  The patient received  IV Cardizem with control of rate in 90s to 100s but remained in atrial  fibrillation.  The patient presently denies any chest pain, shortness of  breath, or palpitations.   PAST MEDICAL HISTORY:  As above.   PAST SURGICAL HISTORY:  1. PTCA and stenting to OM-2 in August 2004.  2. Recent catheterization which showed patent obtuse marginal.   MEDICATIONS AT HOME:  1. Plavix 75 mg p.o. daily.  2. Coumadin 7.5 mg 5 days per week and 10 mg on Wednesday and Sunday.  3. Imdur 60 mg p.o. daily.  4. Altace 5 mg p.o. daily.  5. Betapace AF 80 mg 1/2 tablet every 12 hours which he has also not been     taking.  6. Lipitor 10 mg p.o. daily.  7. Avandia 4 mg p.o. daily.  8. Nitrostat 0.4 mg sublingually p.r.n.   SOCIAL HISTORY:  He is married, works as a Education administrator.  Smoked one pack per day  for 20+ years, quit two months ago.  Drinks occasionally socially despite  being on Coumadin.  Smoked cocaine in the past.  No history of IV drug use.   FAMILY HISTORY:  Noncontributory.   PHYSICAL EXAMINATION:  GENERAL:  Alert, awake, oriented x 3 in no acute  distress.  VITAL SIGNS:  Blood pressure 100/60, pulse 140 irregularly irregular.  HEENT:  Conjunctivae pink.   NECK:  Supple.  No JVD, no bruit.  LUNGS:  Clear to auscultation without rhonchi or rales.  CARDIOVASCULAR;  S1, S2 normal.  There was a soft systolic murmur.  There  was no S3 gallop.  ABDOMEN:  Soft.  Bowel sounds present.  Nontender.  EXTREMITIES:  No clubbing, cyanosis, or edema.   LABORATORY AND X-RAY DATA:  Two sets of cardiac enzymes were negative.  CK  was 307, MB 4.2, relative index 1.4 seconds at 277.  MB 4.4, relative index  1.6, troponin 0.01, 0.02.  Blood alcohol level was 111.  Sodium 141,  potassium 3.2, chloride 108, bicarb 23.  Blood sugar 167.  BUN 18,  creatinine 1.0.  On April 16, sodium 138, potassium 4.3, chloride 104,  bicarb 27, blood sugar 154, BUN 16, creatinine 09.  Magnesium was 1.9.  Liver enzymes were normal.  Hemoglobin was 14.1, hematocrit 41.2, white  count 7.8.  INR 1.2.  Today INR is 2.0.   HOSPITAL COURSE:  The patient was admitted to telemetry unit.  MI was ruled  out by serial enzymes and EKG.  The patient was started on IV heparin, IV  Cardizem drip, and p.o. Betapace.  The patient subsequently converted to  sinus rhythm.  Cardizem drip was discontinued.  The patient was started on  also Coumadin on day #1.  The patient is off Coumadin for the last few days  and has received Betapace for the last six days, and his QTC interval is in  normal range.  The patient has remained in sinus rhythm since conversion.  His INR today is 2.0.  The patient will be discharged home on above  medications and will be followed up in my office in one week.                                                Eduardo Osier. Sharyn Lull, M.D.    MNH/MEDQ  D:  03/19/2004  T:  03/21/2004  Job:  [id number]

## 2011-04-16 NOTE — Discharge Summary (Signed)
NAME:  [name], [name]                            ACCOUNT NO.:  [id number]   MEDICAL RECORD NO.:  [id number]                   PATIENT TYPE:  INP   LOCATION:  3727                                 FACILITY:  [facility]   PHYSICIAN:  Eduardo Osier. Sharyn Lull, M.D.              DATE OF BIRTH:  1956-02-14   DATE OF ADMISSION:  09/02/2003  DATE OF DISCHARGE:  09/04/2003                                 DISCHARGE SUMMARY   ADMISSION DIAGNOSES:  1. New onset angina, rule out restenosis.  2. Coronary artery disease, status post recent percutaneous transluminal     coronary angiography and stenting to third obtuse marginal.  3. Hypertension.  4. Noninsulin-dependent diabetes mellitus.  5. Tobacco abuse.  6. Hypercholesterolemia.   DISCHARGE DIAGNOSES:  1. New onset angina probably secondary to vasospasm, status post left     catheterization.  2. Coronary artery disease, status post percutaneous transluminal coronary     angiography and stenting to third obtuse marginal in August of 2004.  3. Hypertension.  4. Noninsulin-dependent diabetes mellitus.  5. Hypercholesterolemia.  6. Tobacco abuse.   DISCHARGE HOME MEDICATIONS:  1. Enteric-coated aspirin 81 mg two tablets daily.  2. Plavix 75 mg one tablet daily with food.  3. Altace 2.5 mg one capsule daily.  4. Toprol XL 25 mg one tablet daily.  5. Imdur 30 mg one tablet daily in the morning.  6. Avandia 4 mg one tablet daily.  7. Nitrostat 0.4 mg sublingual to use as directed.  8. Lipitor 10 mg one tablet daily.   ACTIVITY:  The patient has been advised to avoid heavy lifting, pushing, or  pulling for 48 hours.  Post catheterization instructions have been given.  The patient has been advised to monitor blood sugar regularly.   FOLLOWUP:  Follow up with me in one week and with Dr. Petra Kuba as  scheduled.   BRIEF HISTORY:  [name] is a 55 year old black male with a past medical  history significant for coronary artery disease, status post PTCA  and  stenting to OM3 in August of 2004, hypertension, noninsulin-dependent  diabetes mellitus, hypercholesterolemia, and tobacco abuse.  He came to the  ER complaining of retrosternal and precordial chest pain described as  tightness, grade 9/10, associated with palpitations, mild shortness of  breath, and diaphoresis since last night off and on.  The patient states  that the pain is similar in nature when he was admitted in August of 2004  when he had PTCA and stenting to OM3.  The patient denies any noncompliance  with medication, but continues to smoke more than one pack per [name].  Denies  any cocaine abuse.  Denies PND and orthopnea, but complains of leg swelling.  He states that his pressure has been running high.  He denies any fever,  chills, cough, or urinary complaints.   PAST MEDICAL HISTORY:  As above.   PAST  SURGICAL HISTORY:  He has had cyst resection from left shoulder and  back in the past.   ALLERGIES:  No known drug allergies.   MEDICATIONS:  At home he is on:  1. Enteric-coated aspirin 81 mg two p.o. daily.  2. Plavix 75 mg p.o. daily.  3. Altace 2.5 mg p.o. daily.  4. Lipitor 10 mg p.o. daily.  5. Avandia 4 mg p.o. daily.  6. Toprol XL 25 mg p.o. daily.   SOCIAL HISTORY:  He is married.  He works as a Education administrator.  He smokes more than  one pack per [name] for 10 years.  He drinks socially - whiskey occasionally.   FAMILY HISTORY:  Noncontributory for coronary artery disease.   PHYSICAL EXAMINATION:  GENERAL APPEARANCE:  He is alert, awake, and oriented  x 3.  In no acute distress.  VITAL SIGNS:  The blood pressure was 130/81 and the pulse was 74 and  regular.  HEENT:  The conjunctivae were pink.  NECK:  Supple.  No JVD.  No bruit.  LUNGS:  Clear to auscultation without rhonchi or rales.  CARDIOVASCULAR:  S1 and S2 were normal.  There was a soft S4 gallop and a  systolic murmur.  ABDOMEN:  Soft.  Bowel sounds were present.  Nontender.  EXTREMITIES:  There was no  cyanosis, clubbing, or edema.   LABORATORY DATA:  The EKG showed normal sinus rhythm with ST depression in  anterolateral leads, which was more prominent than prior EKG.  His repeat  EKG showed further improvement in ST depression in anterolateral leads.  The  chest x-ray showed no evidence of active disease.  The C-reactive protein  was 0.2.  Three sets of CPK-MBs were negative.  Three sets of troponin I  were negative.  The hemoglobin A1C was 8.8.  His sodium was 143, potassium  3.5, chloride 110, blood sugar 146, BUN 22, and creatinine 1.2.  Repeat  fasting blood sugar on September 03, 2003, was 124, BUN 18, and creatinine 0.9.  His hemoglobin was 13.5, hematocrit 39.5, and white count 7.7.  His  cholesterol was 135, LDL 55, and HDL 44.   BRIEF HOSPITAL COURSE:  The patient was admitted to the telemetry unit; MI  was ruled out by serial enzymes and EKG.  The patient further had chest pain  during the hospital stay.  The patient was started on IV nitrates, heparin,  and IIb-IIIa inhibitors were added with resolution of chest pain.  The  patient subsequently underwent left cardiac catheterization with selective  left and right coronary angiography and left ventriculography yesterday  without any complications as per procedure report.  Nitrates p.o. were added  for possible vasospasm.  The patient did not have any further episodes of  chest pain during the hospital stay.  Yesterday evening the patient had  oozing  from the catheterization site requiring a pressure dressing.  The patient's  groin appears stable today.  There is no evidence of hematoma or bruit.  The  patient will be discharged home this afternoon if he remains hemodynamically  stable.                                                Eduardo Osier. Sharyn Lull, M.D.    MNH/MEDQ  D:  09/04/2003  T:  09/04/2003  Job:  [id number]  cc:   Greggory Stallion  Meriel Pica., M.D.  [address] [place]  [place] [address]  Fax: [phone]

## 2011-05-04 ENCOUNTER — Encounter: Payer: Self-pay | Admitting: Internal Medicine

## 2011-05-06 ENCOUNTER — Encounter: Payer: Self-pay | Admitting: Internal Medicine

## 2011-05-06 ENCOUNTER — Ambulatory Visit (INDEPENDENT_AMBULATORY_CARE_PROVIDER_SITE_OTHER): Payer: Self-pay | Admitting: Internal Medicine

## 2011-05-06 VITALS — BP 110/67 | HR 55 | Temp 97.4°F | Ht 75.0 in | Wt 276.6 lb

## 2011-05-06 DIAGNOSIS — E119 Type 2 diabetes mellitus without complications: Secondary | ICD-10-CM

## 2011-05-06 DIAGNOSIS — K219 Gastro-esophageal reflux disease without esophagitis: Secondary | ICD-10-CM

## 2011-05-06 DIAGNOSIS — K089 Disorder of teeth and supporting structures, unspecified: Secondary | ICD-10-CM

## 2011-05-06 DIAGNOSIS — R21 Rash and other nonspecific skin eruption: Secondary | ICD-10-CM | POA: Insufficient documentation

## 2011-05-06 LAB — GLUCOSE, CAPILLARY: Glucose-Capillary: 159 mg/dL — ABNORMAL HIGH (ref 70–99)

## 2011-05-06 LAB — POCT GLYCOSYLATED HEMOGLOBIN (HGB A1C): Hemoglobin A1C: 6.9

## 2011-05-06 MED ORDER — OMEPRAZOLE 40 MG PO CPDR: 40.0000 mg | DELAYED_RELEASE_CAPSULE | Freq: Two times a day (BID) | ORAL | Status: DC

## 2011-05-06 MED ORDER — AMOXICILLIN 500 MG PO TABS: 500.0000 mg | ORAL_TABLET | Freq: Three times a day (TID) | ORAL | Status: AC

## 2011-05-06 MED ORDER — TRAMADOL HCL 50 MG PO TABS: 50.0000 mg | ORAL_TABLET | Freq: Four times a day (QID) | ORAL | Status: DC | PRN

## 2011-05-06 NOTE — Assessment & Plan Note (Addendum)
Hemoglobin A1c today is 6.8. Diabetes is well-controlled. I will continue current medication regimen. I did not read to his history he extends if he needs referral for an ophthalmologist since the main reason for patient's office visit was dental pain and rash.  Please address at the next office visit

## 2011-05-06 NOTE — Assessment & Plan Note (Signed)
Patient noted discoloration on the back which am not concerned at this point. My main concern today are the significant numbers of moles multiple for different in size and color. I referred the patient to a dermatologist. As long as the patient an orange card he would be able to follow up with the a dermatologist.

## 2011-05-06 NOTE — Progress Notes (Signed)
  Subjective:    Patient ID: [name], male    DOB: 10-Oct-1956, 55 y.o.   MRN: [id number]  HPI This is a 36 year gentleman with possibly significant for hyperlipidemia, hypertension, diabetes who presented to the clinic with dental pain and  itching in the back. 1. dental pain: Patient was complaining about dental pain in March of 2012 and was evaluated by Dr. Reche Dixon who prescribed the patient amoxicillin and pain medication and referred the patient to a dentist.  The patient did not  see  the dentist but completed his antibiotic therapy. Patient noted that the dental pain has not improved significantly. He noted there is occasionally some pus drainage coming from the tooth. Denies any fevers or chills. 2. Discolaration in the back: His back has been itching for some time and he would therefore rub his back against the door and to get some relief. He denied any erythema, vesicles, change in detergent, cream or lotion or new medication.  Review of Systems  Constitutional: Negative for chills.  HENT: Negative for neck pain.   Respiratory: Negative for chest tightness.   Cardiovascular: Negative for chest pain and palpitations.  Gastrointestinal: Positive for abdominal distention.  Genitourinary: Negative for difficulty urinating.  Musculoskeletal: Negative for back pain and arthralgias.  Neurological: Negative for dizziness.       Objective:   Physical Exam  Constitutional: He is oriented to person, place, and time. He appears well-developed and well-nourished.  HENT:  Head: Normocephalic and atraumatic.  Neck: Neck supple.  Cardiovascular: Normal rate and regular rhythm.   Pulmonary/Chest: Effort normal and breath sounds normal.  Abdominal: Soft. Bowel sounds are normal. There is no tenderness.  Musculoskeletal: Normal range of motion.  Neurological: He is alert and oriented to person, place, and time.  Skin: Skin is warm and dry.             Assessment & Plan:

## 2011-05-06 NOTE — Assessment & Plan Note (Signed)
Patient was prescribed amoxicillin Ultram for pain relief. Patient was again otherwise to followup with the dentist. Will make a referral for an urgent visit. Patient needs to update his orange heart at this point to be evaluated by the dentist. He noted that he will do that as soon as possible. Patient did not followup with a dentist as recommended in March. He was informed if he continues not to be compliant that the dental abscess we'll progress significantly.

## 2011-06-16 ENCOUNTER — Inpatient Hospital Stay (INDEPENDENT_AMBULATORY_CARE_PROVIDER_SITE_OTHER)
Admission: RE | Admit: 2011-06-16 | Discharge: 2011-06-16 | Disposition: A | Discharge status: Home / Self Care | Payer: Worker's Compensation | Source: Ambulatory Visit | Attending: Emergency Medicine | Admitting: Emergency Medicine

## 2011-06-16 DIAGNOSIS — W460XXA Contact with hypodermic needle, initial encounter: Secondary | ICD-10-CM

## 2011-06-16 DIAGNOSIS — T148XXA Other injury of unspecified body region, initial encounter: Secondary | ICD-10-CM

## 2011-06-16 LAB — HEPATITIS C ANTIBODY (REFLEX): HCV Ab: NEGATIVE

## 2011-06-21 ENCOUNTER — Other Ambulatory Visit: Payer: Self-pay | Admitting: Internal Medicine

## 2011-06-21 ENCOUNTER — Telehealth: Payer: Self-pay | Admitting: Internal Medicine

## 2011-06-21 DIAGNOSIS — I2510 Atherosclerotic heart disease of native coronary artery without angina pectoris: Secondary | ICD-10-CM

## 2011-06-21 DIAGNOSIS — E785 Hyperlipidemia, unspecified: Secondary | ICD-10-CM

## 2011-06-21 DIAGNOSIS — I10 Essential (primary) hypertension: Secondary | ICD-10-CM

## 2011-06-21 NOTE — Telephone Encounter (Signed)
Patient needs a regular office visit. I refilled the medication without any further refills.

## 2011-06-21 NOTE — Telephone Encounter (Signed)
Please schedule pt for routine office visit within 30 days per Dr Loistine Chance.

## 2011-06-21 NOTE — Telephone Encounter (Signed)
Request sent to front dest to schedule

## 2011-07-26 ENCOUNTER — Other Ambulatory Visit: Payer: Self-pay | Admitting: Internal Medicine

## 2011-07-26 ENCOUNTER — Encounter: Payer: Self-pay | Admitting: Internal Medicine

## 2011-07-26 ENCOUNTER — Ambulatory Visit (INDEPENDENT_AMBULATORY_CARE_PROVIDER_SITE_OTHER): Payer: Self-pay | Admitting: Internal Medicine

## 2011-07-26 DIAGNOSIS — I10 Essential (primary) hypertension: Secondary | ICD-10-CM

## 2011-07-26 DIAGNOSIS — M25529 Pain in unspecified elbow: Secondary | ICD-10-CM

## 2011-07-26 DIAGNOSIS — E119 Type 2 diabetes mellitus without complications: Secondary | ICD-10-CM

## 2011-07-26 DIAGNOSIS — E785 Hyperlipidemia, unspecified: Secondary | ICD-10-CM

## 2011-07-26 DIAGNOSIS — K219 Gastro-esophageal reflux disease without esophagitis: Secondary | ICD-10-CM

## 2011-07-26 DIAGNOSIS — I2510 Atherosclerotic heart disease of native coronary artery without angina pectoris: Secondary | ICD-10-CM

## 2011-07-26 DIAGNOSIS — K089 Disorder of teeth and supporting structures, unspecified: Secondary | ICD-10-CM

## 2011-07-26 LAB — BASIC METABOLIC PANEL
BUN: 18 mg/dL (ref 6–23)
CO2: 26 mEq/L (ref 19–32)
Calcium: 9.6 mg/dL (ref 8.4–10.5)
Creat: 0.77 mg/dL (ref 0.50–1.35)
Glucose, Bld: 105 mg/dL — ABNORMAL HIGH (ref 70–99)

## 2011-07-26 LAB — GLUCOSE, CAPILLARY: Glucose-Capillary: 138 mg/dL — ABNORMAL HIGH (ref 70–99)

## 2011-07-26 LAB — POCT GLYCOSYLATED HEMOGLOBIN (HGB A1C): Hemoglobin A1C: 6.3

## 2011-07-26 LAB — LIPID PANEL
HDL: 39 mg/dL — ABNORMAL LOW (ref >39)
Total CHOL/HDL Ratio: 4.8 Ratio
Triglycerides: 129 mg/dL (ref <150)

## 2011-07-26 MED ORDER — SOTALOL HCL 80 MG PO TABS: 40.0000 mg | ORAL_TABLET | Freq: Two times a day (BID) | ORAL | Status: DC

## 2011-07-26 MED ORDER — ASPIRIN 81 MG PO TABS: 81.0000 mg | ORAL_TABLET | Freq: Every day | ORAL | Status: DC

## 2011-07-26 MED ORDER — METFORMIN HCL 1000 MG PO TABS: 1000.0000 mg | ORAL_TABLET | Freq: Two times a day (BID) | ORAL | Status: DC

## 2011-07-26 MED ORDER — LISINOPRIL 10 MG PO TABS: 10.0000 mg | ORAL_TABLET | Freq: Every day | ORAL | Status: DC

## 2011-07-26 NOTE — Progress Notes (Signed)
  Subjective:    Patient ID: [name], male    DOB: 1956/11/14, 55 y.o.   MRN: [id number]  HPI This is a 55 year old male with PMH significant for CAD, DM, HLD who presented to the clinic for medication refill and right elbow pain. Right Elbow pain: 3 days ago the elbow was swollen and tender. He noted that he was lifting a lot of things. The swelling has improved but the pain is still present. No redness. No fever. No trauma. No history of gout. Had similar symptoms in the past.   Patient noted that otherwise he is doing fine.     Review of Systems  Constitutional: Negative for fever, chills and appetite change.  Eyes: Negative for visual disturbance.  Respiratory: Negative for choking and shortness of breath.   Cardiovascular: Negative for chest pain and palpitations.  Gastrointestinal: Negative for abdominal distention.  Genitourinary: Negative for difficulty urinating.  Musculoskeletal: Positive for joint swelling.  Neurological: Negative for dizziness.  Hematological: Negative for adenopathy.       Objective:   Physical Exam  Constitutional: He is oriented to person, place, and time. He appears well-developed.  HENT:  Head: Normocephalic.  Neck: Neck supple.  Cardiovascular: Normal rate, regular rhythm and normal heart sounds.   Pulmonary/Chest: Effort normal and breath sounds normal.  Abdominal: Bowel sounds are normal. There is no tenderness.  Musculoskeletal:       Right elbow: He exhibits normal range of motion, no swelling, no effusion and no deformity. tenderness found. Lateral epicondyle and olecranon process tenderness noted.  Neurological: He is alert and oriented to person, place, and time.  Skin: Skin is warm. No rash noted.          Assessment & Plan:

## 2011-07-27 ENCOUNTER — Encounter: Payer: Self-pay | Admitting: Internal Medicine

## 2011-07-27 MED ORDER — PRAVASTATIN SODIUM 80 MG PO TABS: 80.0000 mg | ORAL_TABLET | Freq: Every day | ORAL | Status: DC

## 2011-07-27 NOTE — Assessment & Plan Note (Signed)
Likely bursitis. Improved at this point. Recommended to use Ibuprofen prn for pain. Has received opioids in the past but I do not think that he needs that at this point. I informed the patient that he needs to call the clinic if he notices recurrent swelling with redness and fever for further evaluation.

## 2011-07-27 NOTE — Assessment & Plan Note (Signed)
No complains at this point. Has been not taking omeprazole since 2 month.

## 2011-07-27 NOTE — Assessment & Plan Note (Signed)
Today Hgb A1c 6.3. Does not check CBG on a regular basis.  I worried about any low blood glucose level therefore I will d/c Glipizide and continue Metformin. At this point my goal to keep the Hgb A1c below 7. Will continue to monitor.

## 2011-07-27 NOTE — Assessment & Plan Note (Signed)
Patient  Need a referral to a dentist but patient has no insurance. He is been informed since more then 6 month to apply for orange card but has not yet provided any paper work to [employer]. I underlined again about the importance to complete the paper work  Then only we will be able to refer the patient.

## 2011-07-27 NOTE — Assessment & Plan Note (Signed)
Will increase Pravastatin from 40 mg to 80 mg since LDL not at goal. Will check LFT in 6 weeks.   Lab Results  Component Value Date   CHOL 187 07/26/2011   CHOL 206* 01/21/2010   CHOL 210* 01/09/2009   Lab Results  Component Value Date   HDL 39* 07/26/2011   HDL 35* 01/21/2010   HDL 47 01/09/2009   Lab Results  Component Value Date   LDLCALC 122* 07/26/2011   LDLCALC 139* 01/21/2010   LDLCALC 131* 01/09/2009   Lab Results  Component Value Date   TRIG 129 07/26/2011   TRIG 158* 01/21/2010   TRIG 158* 01/09/2009   Lab Results  Component Value Date   CHOLHDL 4.8 07/26/2011   CHOLHDL 5.9 Ratio 01/21/2010   CHOLHDL 4.5 Ratio 01/09/2009   No results found for this basename: LDLDIRECT

## 2011-07-27 NOTE — Patient Instructions (Signed)
1. Please stop taking Glipizide. 2. Please increase the pravastatin from 40 mg to 80 mg. 3. Please call the clinic if you experience any recurrent swelling, redness and/or fever for further evaluation.

## 2011-08-27 ENCOUNTER — Other Ambulatory Visit: Payer: Self-pay | Admitting: Internal Medicine

## 2011-08-27 DIAGNOSIS — E785 Hyperlipidemia, unspecified: Secondary | ICD-10-CM

## 2011-08-30 LAB — GLUCOSE, CAPILLARY: Glucose-Capillary: 136 — ABNORMAL HIGH

## 2011-09-14 ENCOUNTER — Encounter: Payer: Self-pay | Admitting: Internal Medicine

## 2011-09-23 ENCOUNTER — Ambulatory Visit (INDEPENDENT_AMBULATORY_CARE_PROVIDER_SITE_OTHER): Payer: Self-pay | Admitting: Internal Medicine

## 2011-09-23 ENCOUNTER — Encounter: Payer: Self-pay | Admitting: Internal Medicine

## 2011-09-23 VITALS — BP 99/68 | HR 64 | Temp 98.0°F | Ht 75.5 in | Wt 277.0 lb

## 2011-09-23 DIAGNOSIS — E119 Type 2 diabetes mellitus without complications: Secondary | ICD-10-CM

## 2011-09-23 DIAGNOSIS — Z299 Encounter for prophylactic measures, unspecified: Secondary | ICD-10-CM

## 2011-09-23 DIAGNOSIS — E785 Hyperlipidemia, unspecified: Secondary | ICD-10-CM

## 2011-09-23 DIAGNOSIS — I2510 Atherosclerotic heart disease of native coronary artery without angina pectoris: Secondary | ICD-10-CM

## 2011-09-23 DIAGNOSIS — I10 Essential (primary) hypertension: Secondary | ICD-10-CM

## 2011-09-23 DIAGNOSIS — Z23 Encounter for immunization: Secondary | ICD-10-CM

## 2011-09-23 MED ORDER — ASPIRIN 81 MG PO TABS: 81.0000 mg | ORAL_TABLET | Freq: Every day | ORAL | Status: DC

## 2011-09-23 MED ORDER — PRAVASTATIN SODIUM 80 MG PO TABS
80.0000 mg | ORAL_TABLET | Freq: Every day | ORAL | Status: DC
Start: 2011-09-23 — End: 2012-11-10

## 2011-09-23 MED ORDER — SOTALOL HCL 80 MG PO TABS: 40.0000 mg | ORAL_TABLET | Freq: Two times a day (BID) | ORAL | Status: DC

## 2011-09-23 MED ORDER — NITROGLYCERIN 0.4 MG SL SUBL: 0.4000 mg | SUBLINGUAL_TABLET | SUBLINGUAL | Status: DC | PRN

## 2011-09-23 MED ORDER — LISINOPRIL 10 MG PO TABS: 10.0000 mg | ORAL_TABLET | Freq: Every day | ORAL | Status: DC

## 2011-09-23 MED ORDER — METFORMIN HCL 1000 MG PO TABS: 1000.0000 mg | ORAL_TABLET | Freq: Two times a day (BID) | ORAL | Status: DC

## 2011-09-23 MED ORDER — ISOSORBIDE MONONITRATE ER 60 MG PO TB24: 60.0000 mg | ORAL_TABLET | Freq: Every day | ORAL | Status: DC

## 2011-09-23 NOTE — Progress Notes (Signed)
Subjective:   Patient ID: [name] male   DOB: [birth year] 55 y.o.   MRN: [id number]  HPI: [name] is a 55 y.o. male with PMH significant as outlined below who presented to the clinic for a regular office visit. He reports that he has been doing fine. He has no complains except dental pain which has been evaluated multiple times but patient is not able to see a dentist since he has no insurance and he has not yet applied for the orange card ( although recommended since months). He noted that he has been taking his medication on a regular basis.    Past Medical History  Diagnosis Date  . TOBACCO ABUSE 11/25/2006  . CORONARY ARTERY DISEASE 11/25/2006   Current Outpatient Prescriptions  Medication Sig Dispense Refill  . aspirin 81 MG tablet Take 1 tablet (81 mg total) by mouth daily.  90 tablet  1  . glucose blood (TRUETRACK TEST) test strip 1 each by Other route 3 (three) times daily. Use as instructed       . isosorbide mononitrate (IMDUR) 60 MG 24 hr tablet Take 1 tablet (60 mg total) by mouth daily.  90 tablet  1  . Lancets MISC by Does not apply route 3 (three) times daily.        Marland Kitchen lisinopril (PRINIVIL,ZESTRIL) 10 MG tablet Take 1 tablet (10 mg total) by mouth daily.  90 tablet  1  . metFORMIN (GLUCOPHAGE) 1000 MG tablet Take 1 tablet (1,000 mg total) by mouth 2 (two) times daily with a meal.  180 tablet  1  . nitroGLYCERIN (NITROSTAT) 0.4 MG SL tablet Place 1 tablet (0.4 mg total) under the tongue every 5 (five) minutes as needed. Do not take more then 3 tablets in 15 min.  30 tablet  1  . pravastatin (PRAVACHOL) 80 MG tablet Take 1 tablet (80 mg total) by mouth daily.  90 tablet  1  . sotalol (BETAPACE) 80 MG tablet Take 0.5 tablets (40 mg total) by mouth 2 (two) times daily.  180 tablet  1   Review of Systems: Constitutional: Denies fever, chills, diaphoresis, appetite change and fatigue.  Respiratory: Denies SOB, DOE, cough, chest tightness,  and wheezing.     Cardiovascular: Denies chest pain, palpitations and leg swelling.  Gastrointestinal: Denies nausea, vomiting, abdominal pain, diarrhea, constipation, blood in stool and abdominal distention.  Genitourinary: Denies dysuria, urgency, frequency, hematuria, flank pain and difficulty urinating.  Musculoskeletal: Denies myalgias, back pain, joint swelling, arthralgias and gait problem.  Skin: Denies pallor, rash and wound.  Neurological: Denies dizziness, seizures, syncope, weakness, light-headedness, numbness and headaches.    Objective:  Physical Exam: Filed Vitals:   09/23/11 1600  BP: 99/68  Pulse: 64  Temp: 98 F (36.7 C)  TempSrc: Oral  Height: 6' 3.5" (1.918 m)  Weight: 277 lb (125.646 kg)   Constitutional: Vital signs reviewed.  Patient is a well-developed and well-nourished  in no acute distress and cooperative with exam. Alert and oriented x3.  Mouth: very poor dentition.  Eyes: PERRL, EOMI, conjunctivae normal,  Neck: Supple,  Cardiovascular: RRR, S1 normal, S2 normal, no MRG, pulses symmetric and intact bilaterally Pulmonary/Chest: CTAB, no wheezes, rales, or rhonchi Abdominal: Soft. Non-tender, non-distended, bowel sounds are normal, no masses, organomegaly, or guarding present.  GU: no CVA tenderness Musculoskeletal: No joint deformities, erythema, or stiffness, ROM full and no nontender Neurological: A&O x3, no focal motor deficit, sensory intact to light touch bilaterally.  Skin: Warm, dry  and intact. No rash, cyanosis, or clubbing.

## 2011-09-23 NOTE — Progress Notes (Deleted)
  Subjective:    Patient ID: [name], male    DOB: 04/20/1956, 55 y.o.   MRN: [id number]  HPI    Review of Systems     Objective:   Physical Exam        Assessment & Plan:

## 2011-09-25 DIAGNOSIS — I10 Essential (primary) hypertension: Secondary | ICD-10-CM | POA: Insufficient documentation

## 2011-09-25 DIAGNOSIS — Z299 Encounter for prophylactic measures, unspecified: Secondary | ICD-10-CM | POA: Insufficient documentation

## 2011-09-25 NOTE — Assessment & Plan Note (Signed)
Diabetes well controlled. Continue current regimen. Lab Results  Component Value Date   HGBA1C 6.3 07/26/2011   HGBA1C 6.9 05/06/2011   HGBA1C 7.3 02/04/2011   Lab Results  Component Value Date   MICROALBUR 57.44* 01/02/2009   LDLCALC 122* 07/26/2011   CREATININE 0.77 07/26/2011

## 2011-09-25 NOTE — Assessment & Plan Note (Signed)
Currently no insurance including orange card. Patient was recommended to  Apply for orange card since months. No referral can be made at this point for Ophthalmology and Colonoscopy.

## 2011-09-25 NOTE — Assessment & Plan Note (Signed)
Blood pressure well controlled. Continue current regimen.  

## 2011-09-25 NOTE — Assessment & Plan Note (Addendum)
Will continue current regimen. Will recheck Lipid panel in 3 month. Changes in dosage  were made in 06/2011. Will recheck LFT  For possible changes in management.

## 2011-09-27 ENCOUNTER — Other Ambulatory Visit: Payer: Self-pay

## 2011-09-27 DIAGNOSIS — E785 Hyperlipidemia, unspecified: Secondary | ICD-10-CM

## 2011-09-27 LAB — HEPATIC FUNCTION PANEL
Albumin: 3.9 g/dL (ref 3.5–5.2)
Alkaline Phosphatase: 55 U/L (ref 39–117)
Total Bilirubin: 0.3 mg/dL (ref 0.3–1.2)
Total Protein: 6.2 g/dL (ref 6.0–8.3)

## 2011-10-25 ENCOUNTER — Telehealth: Payer: Self-pay | Admitting: *Deleted

## 2011-10-25 NOTE — Telephone Encounter (Signed)
Will see tomorrow at 945.

## 2011-10-25 NOTE — Telephone Encounter (Signed)
Pt's wife called stating husband is c/o cough, fever , feeling cold. Onset 2 - 3 days. Today fever is gone but cough is hard and body soreness. Decrease appetite. Feels miserable. Drinking good. taking OTC meds without relief. ( cough meds )  Pt # [phone]  Does he need to be seen or should he wait and see if this resolves?

## 2011-10-25 NOTE — Telephone Encounter (Signed)
Schedule for appt tomorrow 

## 2011-10-26 ENCOUNTER — Ambulatory Visit (INDEPENDENT_AMBULATORY_CARE_PROVIDER_SITE_OTHER): Payer: Self-pay | Admitting: Ophthalmology

## 2011-10-26 ENCOUNTER — Encounter: Payer: Self-pay | Admitting: Ophthalmology

## 2011-10-26 DIAGNOSIS — R6889 Other general symptoms and signs: Secondary | ICD-10-CM | POA: Insufficient documentation

## 2011-10-26 DIAGNOSIS — E119 Type 2 diabetes mellitus without complications: Secondary | ICD-10-CM

## 2011-10-26 DIAGNOSIS — E785 Hyperlipidemia, unspecified: Secondary | ICD-10-CM

## 2011-10-26 DIAGNOSIS — K089 Disorder of teeth and supporting structures, unspecified: Secondary | ICD-10-CM

## 2011-10-26 LAB — POCT GLYCOSYLATED HEMOGLOBIN (HGB A1C): Hemoglobin A1C: 8.3

## 2011-10-26 LAB — GLUCOSE, CAPILLARY: Glucose-Capillary: 238 mg/dL — ABNORMAL HIGH (ref 70–99)

## 2011-10-26 MED ORDER — GLIPIZIDE 5 MG PO TABS: 5.0000 mg | ORAL_TABLET | Freq: Every day | ORAL | Status: DC

## 2011-10-26 MED ORDER — HYDROCODONE-ACETAMINOPHEN 5-500 MG PO TABS: 1.0000 | ORAL_TABLET | Freq: Every day | ORAL | Status: DC

## 2011-10-26 MED ORDER — AMOXICILLIN 500 MG PO CAPS: 500.0000 mg | ORAL_CAPSULE | Freq: Three times a day (TID) | ORAL | Status: AC

## 2011-10-26 NOTE — Assessment & Plan Note (Signed)
Hyperlipidemia: LDL is 122 in August which is over goal. However panel has not been checked since his dose was increased in August.  Check at next visit.

## 2011-10-26 NOTE — Assessment & Plan Note (Signed)
Advised patient that he likely has a virus and antibiotics will not help him get better. Advised him to rest, drink plenty of fluids and try to eat.

## 2011-10-26 NOTE — Assessment & Plan Note (Signed)
Patient's blood glucose was quite elevated today and his HgA1c rose dramatically from 3 months ago when glipizide was stopped (not noted in chart but patient reports). Will restart glipizide and have him return in a month to see if sugar is better controlled or if he needs further escalation. Encouraged patient to return to exercising.

## 2011-10-26 NOTE — Progress Notes (Signed)
agree with plans and notes 

## 2011-10-26 NOTE — Progress Notes (Signed)
Subjective:   Patient ID: [name] male   DOB: 05/06/1956 55 y.o.   MRN: [id number]  HPI: [name] is a 55 y.o. man here with flu-like symptoms  Since thursday feeling weak, sore all over, nauseous, headache. Slight cough & congestion. Fever 102 over the weekend, was 100 yesterday. Taking Aleeve, advil and bought mucinex. Some dry heaving yesterday. No appetite, eating chicken broth, drinking water and juice about 4 cups a day. Not able to sleep.  DM: Was exercising regularly (walking and weights), stopped about a month ago.   RHM: Needs to get orange card for dentistry and ophthalmology, and colonoscopy.  Dental pain- for 6 days. Has taking antibiotics in the past in March and June along with pain medicines. Pain is 8/10.  Hyperlipidemia: LDL is 122 in August which is over goal.   Past Medical History  Diagnosis Date  . TOBACCO ABUSE 11/25/2006  . CORONARY ARTERY DISEASE 11/25/2006   Current Outpatient Prescriptions  Medication Sig Dispense Refill  . aspirin 81 MG tablet Take 1 tablet (81 mg total) by mouth daily.  90 tablet  1  . glucose blood (TRUETRACK TEST) test strip 1 each by Other route 3 (three) times daily. Use as instructed       . isosorbide mononitrate (IMDUR) 60 MG 24 hr tablet Take 1 tablet (60 mg total) by mouth daily.  90 tablet  1  . Lancets MISC by Does not apply route 3 (three) times daily.        Marland Kitchen lisinopril (PRINIVIL,ZESTRIL) 10 MG tablet Take 1 tablet (10 mg total) by mouth daily.  90 tablet  1  . metFORMIN (GLUCOPHAGE) 1000 MG tablet Take 1 tablet (1,000 mg total) by mouth 2 (two) times daily with a meal.  180 tablet  1  . nitroGLYCERIN (NITROSTAT) 0.4 MG SL tablet Place 1 tablet (0.4 mg total) under the tongue every 5 (five) minutes as needed. Do not take more then 3 tablets in 15 min.  30 tablet  1  . pravastatin (PRAVACHOL) 80 MG tablet Take 1 tablet (80 mg total) by mouth daily.  90 tablet  1  . sotalol (BETAPACE) 80 MG tablet Take 0.5 tablets  (40 mg total) by mouth 2 (two) times daily.  180 tablet  1   No family history on file. History   Social History  . Marital Status: Married    Spouse Name: N/A    Number of Children: N/A  . Years of Education: N/A   Social History Main Topics  . Smoking status: Former Smoker -- 0.5 packs/day    Types: Cigarettes    Quit date: 07/26/1979  . Smokeless tobacco: None  . Alcohol Use: No  . Drug Use: None  . Sexually Active: None   Other Topics Concern  . None   Social History Narrative  . None   Objective:  Physical Exam: Filed Vitals:   10/26/11 1021  BP: 103/66  Pulse: 98  Temp: 98 F (36.7 C)  TempSrc: Oral  Height: 6\' 3"  (1.905 m)  Weight: 269 lb 11.2 oz (122.335 kg)   General: middle aged male who appears to be fatigued HEENT: PERRL, EOMI, no scleral icterus Mouth exam: patient has front lower dentures for 4 teeth. Has two areas of gray necrotic appearing gums on both sides of his dentures. Surrounding gingiva is mildly erythematous and swollen. Cardiac: RRR, no rubs, murmurs or gallops Pulm: clear to auscultation bilaterally, moving normal volumes of air Neuro: alert and  oriented X3, cranial nerves II-XII grossly intact  Assessment & Plan:

## 2011-10-26 NOTE — Patient Instructions (Signed)
Please come in and get orange card as soon as you feel better.

## 2011-10-26 NOTE — Assessment & Plan Note (Addendum)
Patient says that he is working on his orange card paperwork. Advised him to come in this week so we can deal with his severe gingivitis with necrotic appearing tissue. Prescribed amoxicillin and vicodin to hold him over.

## 2011-11-03 ENCOUNTER — Encounter: Payer: Self-pay | Admitting: Internal Medicine

## 2011-11-03 ENCOUNTER — Ambulatory Visit (INDEPENDENT_AMBULATORY_CARE_PROVIDER_SITE_OTHER): Payer: Self-pay | Admitting: Internal Medicine

## 2011-11-03 DIAGNOSIS — R6889 Other general symptoms and signs: Secondary | ICD-10-CM

## 2011-11-03 DIAGNOSIS — I2510 Atherosclerotic heart disease of native coronary artery without angina pectoris: Secondary | ICD-10-CM

## 2011-11-03 DIAGNOSIS — I10 Essential (primary) hypertension: Secondary | ICD-10-CM

## 2011-11-03 DIAGNOSIS — E119 Type 2 diabetes mellitus without complications: Secondary | ICD-10-CM

## 2011-11-03 LAB — GLUCOSE, CAPILLARY: Glucose-Capillary: 121 mg/dL — ABNORMAL HIGH (ref 70–99)

## 2011-11-03 MED ORDER — LISINOPRIL 5 MG PO TABS: 5.0000 mg | ORAL_TABLET | Freq: Every day | ORAL | Status: DC

## 2011-11-03 MED ORDER — ISOSORBIDE MONONITRATE 30 MG PO TB24: 30.0000 mg | ORAL_TABLET | Freq: Every day | ORAL | Status: DC

## 2011-11-03 NOTE — Patient Instructions (Signed)
Please reduce lisinopril to 5mg  daily.  Please reduce Imdur to 30mg  daily.

## 2011-11-03 NOTE — Assessment & Plan Note (Signed)
Patient returns to the clinic complaining of persistent feeling of dizziness when he stands up, note that he has completed a course of Augmentin recently for dental abscess, is no longer having fever or chills from his upper respiratory infection which is most likely viral in origin. His blood pressure today is 101/63. Orthostatic vitals are negative. Given the patient's reduced oral intake his dizziness is most likely from hypertension, will reduce his lisinopril and Imdur half dose and have the patient followup if he does not feel better. This plan has been discussed with Dr. Aundria Rud

## 2011-11-03 NOTE — Progress Notes (Signed)
  Subjective:    Patient ID: [name], male    DOB: [birth year], 55 y.o.   MRN: [id number]  HPI  Patient is a 55 year old male with a past medical history listed below, presents to the [facility] one week after he was seen here for flu like symptoms, these have now resolved, but now patient complaints of lightheadedness when he stands. Patient was started on Augmentin during last visit secondary to dental caries, the patient is still not seen a dentist. No sign of dental abscess currently present. Patient denies any fever chills, or vomiting, but doesn't have dizziness with standing  Review of Systems  Constitutional: Positive for fatigue. Negative for fever and chills.  Respiratory: Positive for cough.   Neurological: Positive for weakness and light-headedness. Negative for tremors, seizures, syncope, speech difficulty and headaches.  All other systems reviewed and are negative.       Objective:   Physical Exam  Nursing note and vitals reviewed. Constitutional: He is oriented to person, place, and time. He appears well-developed and well-nourished.  HENT:  Head: Normocephalic and atraumatic.  Eyes: Pupils are equal, round, and reactive to light.  Neck: Normal range of motion. No JVD present. No thyromegaly present.  Cardiovascular: Normal rate, regular rhythm and normal heart sounds.   Pulmonary/Chest: Effort normal and breath sounds normal. He has no wheezes. He has no rales.  Abdominal: Soft. Bowel sounds are normal. There is no tenderness. There is no rebound.  Musculoskeletal: Normal range of motion. He exhibits no edema.  Neurological: He is alert and oriented to person, place, and time.  Skin: Skin is warm and dry.          Assessment & Plan:

## 2011-12-28 ENCOUNTER — Encounter: Payer: Self-pay | Admitting: Internal Medicine

## 2012-01-21 ENCOUNTER — Other Ambulatory Visit: Payer: Self-pay | Admitting: Internal Medicine

## 2012-01-21 DIAGNOSIS — R21 Rash and other nonspecific skin eruption: Secondary | ICD-10-CM

## 2012-03-16 ENCOUNTER — Ambulatory Visit (INDEPENDENT_AMBULATORY_CARE_PROVIDER_SITE_OTHER): Payer: Worker's Compensation | Admitting: Internal Medicine

## 2012-03-16 ENCOUNTER — Encounter: Payer: Self-pay | Admitting: Internal Medicine

## 2012-03-16 VITALS — BP 100/60 | HR 68 | Temp 97.8°F | Ht 75.0 in | Wt 281.9 lb

## 2012-03-16 DIAGNOSIS — Z79899 Other long term (current) drug therapy: Secondary | ICD-10-CM

## 2012-03-16 DIAGNOSIS — H60399 Other infective otitis externa, unspecified ear: Secondary | ICD-10-CM

## 2012-03-16 DIAGNOSIS — E119 Type 2 diabetes mellitus without complications: Secondary | ICD-10-CM

## 2012-03-16 DIAGNOSIS — H6093 Unspecified otitis externa, bilateral: Secondary | ICD-10-CM

## 2012-03-16 DIAGNOSIS — H6090 Unspecified otitis externa, unspecified ear: Secondary | ICD-10-CM | POA: Insufficient documentation

## 2012-03-16 LAB — GLUCOSE, CAPILLARY: Glucose-Capillary: 129 mg/dL — ABNORMAL HIGH (ref 70–99)

## 2012-03-16 MED ORDER — HYDROCODONE-ACETAMINOPHEN 5-325 MG PO TABS: 1.0000 | ORAL_TABLET | Freq: Four times a day (QID) | ORAL | Status: AC | PRN

## 2012-03-16 MED ORDER — CIPROFLOXACIN HCL 500 MG PO TABS: 500.0000 mg | ORAL_TABLET | Freq: Two times a day (BID) | ORAL | Status: AC

## 2012-03-16 NOTE — Assessment & Plan Note (Addendum)
The clinical manifestation is consistent with the Dx. Dr. Aundria Rud examined patient and agreed with the plan.  - Cipro - Vicodin - patient is instructed to come back or go to ED if s/s worsening or spiked fever

## 2012-03-16 NOTE — Assessment & Plan Note (Addendum)
HbA1C 7.2 today. Patient states that he has been compliant with his medical Tx.  - continue current regimen

## 2012-03-16 NOTE — Patient Instructions (Signed)
1. Follow up in 2 weeks 2. Will give you Cipro and Vicodin

## 2012-03-16 NOTE — Progress Notes (Signed)
Patient ID: [name], male   DOB: 23-Nov-1956, 56 y.o.   MRN: [id number]  Subjective:   Patient ID: [name] male   DOB: 06-03-1956 56 y.o.   MRN: [id number]  HPI: TyroneEmmanuell S Moody is a 56 y.o. man with PMH of Afib, DM. Type II, HLD, HTN who presents to the clinic with ear pain.  Patient states that he started to have bilateral ear pain 3 weeks ago. His ear pain is gradual onset, 5-8/10, no radiation. Lying on his sides make the pain worse. Nothing makes it better. He took OTC Tylenol/Aleeve without any relief. He also reports whitish/yellowish thick discharge from his both ears. Denies any greenish or bloody drainage. He is not sure whether he has had tinnitus. However, he endorses some degree of hearing loss, describing as "everything is muffled." He admits that he uses cotton sticks to clean his ears at times. He also bought some OTC ear drops with no relief ( he does not remember the name). Denies swimming, immerse under the water this year.  Denies any sick contact recent travel. Denies recent sickness. Denies any similar problems in the past.     No headache, fever, running nose or sore throat. No shortness of breath or dyspnea on exertion. No chest pain, chest pressure or palpitation No nausea, vomiting, or abdominal pain. No melena, diarrhea or incontinence. No muscle weakness.                   Denies depression. No appetite or weight changes.     Past Medical History  Diagnosis Date  . TOBACCO ABUSE 11/25/2006  . CORONARY ARTERY DISEASE 11/25/2006   Current Outpatient Prescriptions  Medication Sig Dispense Refill  . aspirin 81 MG tablet Take 1 tablet (81 mg total) by mouth daily.  90 tablet  1  . glipiZIDE (GLUCOTROL) 5 MG tablet Take 1 tablet (5 mg total) by mouth daily.  30 tablet  11  . glucose blood (TRUETRACK TEST) test strip 1 each by Other route 3 (three) times daily. Use as instructed       . HYDROcodone-acetaminophen (VICODIN) 5-500 MG per tablet Take 1 tablet by  mouth at bedtime.  20 tablet  0  . isosorbide mononitrate (IMDUR) 30 MG CR tablet Take 1 tablet (30 mg total) by mouth daily.  90 tablet  1  . Lancets MISC by Does not apply route 3 (three) times daily.        Marland Kitchen lisinopril (PRINIVIL,ZESTRIL) 5 MG tablet Take 1 tablet (5 mg total) by mouth daily.  90 tablet  1  . metFORMIN (GLUCOPHAGE) 1000 MG tablet Take 1 tablet (1,000 mg total) by mouth 2 (two) times daily with a meal.  180 tablet  1  . nitroGLYCERIN (NITROSTAT) 0.4 MG SL tablet Place 1 tablet (0.4 mg total) under the tongue every 5 (five) minutes as needed. Do not take more then 3 tablets in 15 min.  30 tablet  1  . pravastatin (PRAVACHOL) 80 MG tablet Take 1 tablet (80 mg total) by mouth daily.  90 tablet  1  . sotalol (BETAPACE) 80 MG tablet Take 0.5 tablets (40 mg total) by mouth 2 (two) times daily.  180 tablet  1  . triamcinolone cream (KENALOG) 0.1 % APPLY TO SCALP AND EAR ONCE A DAY  15 g  0   No family history on file. History   Social History  . Marital Status: Married    Spouse Name: N/A  Number of Children: N/A  . Years of Education: N/A   Social History Main Topics  . Smoking status: Former Smoker -- 0.5 packs/day    Types: Cigarettes    Quit date: 07/26/1979  . Smokeless tobacco: None  . Alcohol Use: No  . Drug Use: None  . Sexually Active: None   Other Topics Concern  . None   Social History Narrative  . None   Review of Systems: See HPI  Objective:  Physical Exam: Filed Vitals:   03/16/12 1353  BP: 100/60  Pulse: 68  Temp: 97.8 F (36.6 C)  TempSrc: Oral  Height: 6\' 3"  (1.905 m)  Weight: 281 lb 14.4 oz (127.869 kg)   General: alert, well-developed, and cooperative to examination.  Head: normocephalic and atraumatic.  Ear: bilateral ears pus like thick drainage noted. Right worse than left. No tenderness noted on B/L mastoid area.  B/L tympanic membrane intact Eyes: vision grossly intact, pupils equal, pupils round, pupils reactive to light, no  injection and anicteric.  Mouth: pharynx pink and moist, no erythema, and no exudates.  Neck: supple, full ROM, no thyromegaly, no JVD, and no carotid bruits.  Lungs: normal respiratory effort, no accessory muscle use, normal breath sounds, no crackles, and no wheezes. Heart: normal rate, regular rhythm, no murmur, no gallop, and no rub.  Abdomen: soft, non-tender, normal bowel sounds, no distention, no guarding, no rebound tenderness, no hepatomegaly, and no splenomegaly.  Msk: no joint swelling, no joint warmth, and no redness over joints.  Pulses: 2+ DP/PT pulses bilaterally Extremities: No cyanosis, clubbing, edema Neurologic: alert & oriented X3, cranial nerves II-XII intact, strength normal in all extremities, sensation intact to light touch, and gait normal.  Skin: turgor normal and no rashes.  Psych: Oriented X3, memory intact for recent and remote, normally interactive, good eye contact, not anxious appearing, and not depressed appearing.   Assessment & Plan:

## 2012-04-27 ENCOUNTER — Encounter: Payer: Self-pay | Admitting: Internal Medicine

## 2012-04-27 ENCOUNTER — Ambulatory Visit (INDEPENDENT_AMBULATORY_CARE_PROVIDER_SITE_OTHER): Payer: Self-pay | Admitting: Internal Medicine

## 2012-04-27 VITALS — BP 113/64 | HR 77 | Temp 96.0°F | Ht 75.0 in | Wt 286.6 lb

## 2012-04-27 DIAGNOSIS — K089 Disorder of teeth and supporting structures, unspecified: Secondary | ICD-10-CM

## 2012-04-27 DIAGNOSIS — H60399 Other infective otitis externa, unspecified ear: Secondary | ICD-10-CM

## 2012-04-27 DIAGNOSIS — E119 Type 2 diabetes mellitus without complications: Secondary | ICD-10-CM

## 2012-04-27 DIAGNOSIS — H6093 Unspecified otitis externa, bilateral: Secondary | ICD-10-CM

## 2012-04-27 MED ORDER — HYDROCODONE-ACETAMINOPHEN 5-500 MG PO TABS: 1.0000 | ORAL_TABLET | Freq: Every day | ORAL | Status: DC

## 2012-04-27 MED ORDER — NEOMYCIN-POLYMYXIN-HC 3.5-10000-1 OT SOLN: 4.0000 [drp] | Freq: Four times a day (QID) | OTIC | Status: AC

## 2012-04-27 NOTE — Progress Notes (Signed)
Subjective:   Patient ID: [name] male   DOB: February 04, 1956 56 y.o.   MRN: [id number]  HPI: [name] is a 56 y.o. man with PMH of Afib, DM. Type II, HLD, HTN who presents to the clinicfor follow up visit after he was treated for Otitis externa on 03/16/12.  Patient states that he finished 10 day-course of ABX Cipro since last office visit and all of his symptoms were resolved for about one week. He reports that he started to have similar symptoms back about 3 weeks ago. He was unable to obtain an appointment at that time.  His ear pain was gradual onset, 5-8/10, no radiation. Lying on his sides make the pain worse. Pain worse at night. Nothing makes it better.  He also reports yellowish thick discharge from his both ears. Denies any greenish or bloody drainage. Denies tinnitus or hearing loss. Denies swimming, immerse under the water this year. Denies any sick contact recent travel. Denies recent sickness. Denies any similar problems in the past.   He admits that he uses cotton sticks to clean his ears at times.   No headache, fever, running nose or sore throat. No shortness of breath or dyspnea on exertion. No chest pain, chest pressure or palpitation No nausea, vomiting, or abdominal pain. No melena, diarrhea or incontinence. No muscle weakness.                   Denies depression. No appetite or weight changes.     Past Medical History  Diagnosis Date  . TOBACCO ABUSE 11/25/2006  . CORONARY ARTERY DISEASE 11/25/2006   Current Outpatient Prescriptions  Medication Sig Dispense Refill  . aspirin 81 MG tablet Take 1 tablet (81 mg total) by mouth daily.  90 tablet  1  . glipiZIDE (GLUCOTROL) 5 MG tablet Take 1 tablet (5 mg total) by mouth daily.  30 tablet  11  . glucose blood (TRUETRACK TEST) test strip 1 each by Other route 3 (three) times daily. Use as instructed       . HYDROcodone-acetaminophen (VICODIN) 5-500 MG per tablet Take 1 tablet by mouth at bedtime.  20 tablet  0  .  isosorbide mononitrate (IMDUR) 30 MG CR tablet Take 1 tablet (30 mg total) by mouth daily.  90 tablet  1  . Lancets MISC by Does not apply route 3 (three) times daily.        Marland Kitchen lisinopril (PRINIVIL,ZESTRIL) 5 MG tablet Take 1 tablet (5 mg total) by mouth daily.  90 tablet  1  . metFORMIN (GLUCOPHAGE) 1000 MG tablet Take 1 tablet (1,000 mg total) by mouth 2 (two) times daily with a meal.  180 tablet  1  . nitroGLYCERIN (NITROSTAT) 0.4 MG SL tablet Place 1 tablet (0.4 mg total) under the tongue every 5 (five) minutes as needed. Do not take more then 3 tablets in 15 min.  30 tablet  1  . pravastatin (PRAVACHOL) 80 MG tablet Take 1 tablet (80 mg total) by mouth daily.  90 tablet  1  . sotalol (BETAPACE) 80 MG tablet Take 0.5 tablets (40 mg total) by mouth 2 (two) times daily.  180 tablet  1  . triamcinolone cream (KENALOG) 0.1 % APPLY TO SCALP AND EAR ONCE A DAY  15 g  0   No family history on file. History   Social History  . Marital Status: Married    Spouse Name: N/A    Number of Children: N/A  . Years  of Education: N/A   Social History Main Topics  . Smoking status: Former Smoker -- 0.5 packs/day    Types: Cigarettes    Quit date: 07/26/1979  . Smokeless tobacco: None  . Alcohol Use: No  . Drug Use: None  . Sexually Active: None   Other Topics Concern  . None   Social History Narrative  . None   Review of Systems: See HPI  Objective:  Physical Exam: Filed Vitals:   04/27/12 1413  BP: 113/64  Pulse: 77  Temp: 96 F (35.6 C)  TempSrc: Oral  Height: 6\' 3"  (1.905 m)  Weight: 286 lb 9.6 oz (130.001 kg)  SpO2: 96%   General: alert, well-developed, and cooperative to examination.  Head: normocephalic and atraumatic.  Ear: bilateral ears pus like thick drainage noted. Right worse than left. Mild tenderness on B/L anterior area of ears. B/L earlobes tenderness noted. No tenderness noted on B/L mastoid area. Eyes: vision grossly intact, pupils equal, pupils round, pupils  reactive to light, no injection and anicteric.  Mouth: pharynx pink and moist, no erythema, and no exudates.  Neck: supple, full ROM, no thyromegaly, no JVD, and no carotid bruits.  Lungs: normal respiratory effort, no accessory muscle use, normal breath sounds, no crackles, and no wheezes. Heart: normal rate, regular rhythm, no murmur, no gallop, and no rub.  Abdomen: soft, non-tender, normal bowel sounds, no distention, no guarding, no rebound tenderness, no hepatomegaly, and no splenomegaly.  Msk: no joint swelling, no joint warmth, and no redness over joints.  Pulses: 2+ DP/PT pulses bilaterally Extremities: No cyanosis, clubbing, edema Neurologic: alert & oriented X3, cranial nerves II-XII intact, strength normal in all extremities, sensation intact to light touch, and gait normal.  Skin: turgor normal and no rashes.  Psych: Oriented X3, memory intact for recent and remote, normally interactive, good eye contact, not anxious appearing, and not depressed appearing.   Assessment & Plan:

## 2012-04-27 NOTE — Patient Instructions (Signed)
1. Follow up in 2 weeks ( any physician is ok) 2. Please use the antibiotic ear drops 4 drops to both ears for 4 times daily 3. Will give your Vicodin for your pain. 4. If you start to have fever or your symptoms are worsening, please go to ED.

## 2012-05-02 NOTE — Assessment & Plan Note (Addendum)
The clinical manifestation is consistent with the Dx.  Dr. Aundria Rud examined patient and agreed with the plan.  - Cortisporin ear drops 4 drops QID - Vicodin - instructed patient to follow up in 2 weeks, may consider ENT consult if not better - HIV Negative and ESR 8

## 2012-05-11 ENCOUNTER — Encounter: Payer: Self-pay | Admitting: Internal Medicine

## 2012-05-18 ENCOUNTER — Ambulatory Visit (INDEPENDENT_AMBULATORY_CARE_PROVIDER_SITE_OTHER): Payer: Self-pay | Admitting: Internal Medicine

## 2012-05-18 ENCOUNTER — Encounter: Payer: Self-pay | Admitting: Internal Medicine

## 2012-05-18 VITALS — BP 107/67 | HR 73 | Temp 97.2°F | Ht 75.0 in | Wt 278.6 lb

## 2012-05-18 DIAGNOSIS — H6093 Unspecified otitis externa, bilateral: Secondary | ICD-10-CM

## 2012-05-18 DIAGNOSIS — H60399 Other infective otitis externa, unspecified ear: Secondary | ICD-10-CM

## 2012-05-18 MED ORDER — FLUOCINONIDE 0.05 % EX CREA: TOPICAL_CREAM | Freq: Two times a day (BID) | CUTANEOUS | Status: DC

## 2012-05-18 NOTE — Assessment & Plan Note (Signed)
Patient has now been treated once with oral antibacterials and a second time with drops containing antifungals for presumed bilateral otitis externa. Both times, however, his symptoms have returned despite initial resolution. Exam is consistent with an inflammatory process around the external ear including the canal itself, pinna, and even behind the ear on the left. He does have significant tenderness to palpation. Because he's been treated twice unsuccessfully, this raises the question of if this is an infectious etiology. Alternatives would include psoriasis versus contact dermatitis. Examination of his knees does reveal hyperpigmentation with a silver hue. While not classic for psoriasis, I think he may in fact have the disease. We will try a course of topical high potency corticosteroid which is safe for the external ear, ear canal, and knees. This would also cover contact dermatitis as well. - Fluocinonide 0.05% twice a day - RTC in 3 weeks for reevaluation - Patient has had long-standing history of difficulty getting the orange card. I sent him straight to [place] office, but I don't know where she will get. He will need ENT referral if this continues, but this will be impossible without the orange card

## 2012-05-18 NOTE — Progress Notes (Signed)
Subjective:     Patient ID: LUISANTONIO ADINOLFI, male   DOB: 1956-01-21, 56 y.o.   MRN: [id number]  HPI Patient is a 56 gentleman with diabetes, hypertension, hyperlipidemia, A. fib, and CAD who presents for continued followup of bilateral ear pain.  Patient was seen on 4/18 and treated for presumed otitis externa with by mouth Cipro. He was then seen again on 5/30 with continued symptoms and treated with Cortisporin drops. Both times his symptoms improved with treatment, but both times his symptoms recurred.  He now presents with continued pain and drainage of the bilateral, right greater than left, ears. He says that they're quite painful. HIV and ESR drawn at last visit were both unremarkable.  Review of Systems No headache, no fever, no runny nose or sore throat    Objective:   Physical Exam Gen: NAD, cooperative HEENT: bilateral ears show thick,dried yellow pus in external ear canal, around pinna, and even behind ear.  Some scaling and subsequent raw skin exposed.  Tenderness to palpation at the ear canal entrance. Skin: b/l knees show hyperpigmentation with silver scaling and mildly erythematous border    Assessment:         Plan:

## 2012-05-29 ENCOUNTER — Telehealth: Payer: Self-pay | Admitting: *Deleted

## 2012-05-29 ENCOUNTER — Other Ambulatory Visit: Payer: Self-pay | Admitting: Internal Medicine

## 2012-05-29 MED ORDER — ISOSORBIDE MONONITRATE ER 30 MG PO TB24: 30.0000 mg | ORAL_TABLET | Freq: Every day | ORAL | Status: DC

## 2012-05-29 NOTE — Telephone Encounter (Signed)
Needs refill on Imdur 60mg  ER #30 - last filled 04/21/12 and order written 06/21/11.

## 2012-06-06 ENCOUNTER — Encounter (HOSPITAL_COMMUNITY): Payer: Self-pay | Admitting: *Deleted

## 2012-06-06 ENCOUNTER — Emergency Department (HOSPITAL_COMMUNITY): Payer: Self-pay

## 2012-06-06 ENCOUNTER — Inpatient Hospital Stay (HOSPITAL_COMMUNITY)
Admission: EM | Admit: 2012-06-06 | Discharge: 2012-06-07 | DRG: 603 | Disposition: A | Discharge status: Home / Self Care | Payer: Self-pay | Attending: Internal Medicine | Admitting: Internal Medicine

## 2012-06-06 DIAGNOSIS — L02415 Cutaneous abscess of right lower limb: Secondary | ICD-10-CM

## 2012-06-06 DIAGNOSIS — H6090 Unspecified otitis externa, unspecified ear: Secondary | ICD-10-CM

## 2012-06-06 DIAGNOSIS — I2510 Atherosclerotic heart disease of native coronary artery without angina pectoris: Secondary | ICD-10-CM

## 2012-06-06 DIAGNOSIS — L03115 Cellulitis of right lower limb: Secondary | ICD-10-CM

## 2012-06-06 DIAGNOSIS — L03119 Cellulitis of unspecified part of limb: Principal | ICD-10-CM

## 2012-06-06 DIAGNOSIS — E119 Type 2 diabetes mellitus without complications: Secondary | ICD-10-CM | POA: Diagnosis present

## 2012-06-06 DIAGNOSIS — E785 Hyperlipidemia, unspecified: Secondary | ICD-10-CM | POA: Diagnosis present

## 2012-06-06 DIAGNOSIS — H6060 Unspecified chronic otitis externa, unspecified ear: Secondary | ICD-10-CM

## 2012-06-06 DIAGNOSIS — L02212 Cutaneous abscess of back [any part, except buttock]: Secondary | ICD-10-CM

## 2012-06-06 DIAGNOSIS — I4891 Unspecified atrial fibrillation: Secondary | ICD-10-CM | POA: Diagnosis present

## 2012-06-06 DIAGNOSIS — L02419 Cutaneous abscess of limb, unspecified: Principal | ICD-10-CM | POA: Diagnosis present

## 2012-06-06 DIAGNOSIS — H608X9 Other otitis externa, unspecified ear: Secondary | ICD-10-CM | POA: Diagnosis present

## 2012-06-06 DIAGNOSIS — H6093 Unspecified otitis externa, bilateral: Secondary | ICD-10-CM

## 2012-06-06 DIAGNOSIS — I10 Essential (primary) hypertension: Secondary | ICD-10-CM | POA: Diagnosis present

## 2012-06-06 HISTORY — DX: Cellulitis of right lower limb: L03.115

## 2012-06-06 HISTORY — DX: Unspecified chronic otitis externa, unspecified ear: H60.60

## 2012-06-06 HISTORY — DX: Other specified cardiac arrhythmias: I49.8

## 2012-06-06 HISTORY — DX: Essential (primary) hypertension: I10

## 2012-06-06 HISTORY — DX: Type 2 diabetes mellitus without complications: E11.9

## 2012-06-06 LAB — BASIC METABOLIC PANEL
BUN: 18 mg/dL (ref 6–23)
Calcium: 9.3 mg/dL (ref 8.4–10.5)
Creatinine, Ser: 0.8 mg/dL (ref 0.50–1.35)
GFR calc Af Amer: >90 mL/min (ref >90)
GFR calc non Af Amer: >90 mL/min (ref >90)
Glucose, Bld: 98 mg/dL (ref 70–99)

## 2012-06-06 LAB — CBC WITH DIFFERENTIAL/PLATELET
Basophils Relative: 0 % (ref 0–1)
Eosinophils Absolute: 0.2 10*3/uL (ref 0.0–0.7)
Eosinophils Relative: 2 % (ref 0–5)
Hemoglobin: 12.2 g/dL — ABNORMAL LOW (ref 13.0–17.0)
Lymphs Abs: 3.7 10*3/uL (ref 0.7–4.0)
MCH: 28.8 pg (ref 26.0–34.0)
MCHC: 34.4 g/dL (ref 30.0–36.0)
MCV: 83.7 fL (ref 78.0–100.0)
Monocytes Relative: 6 % (ref 3–12)
Neutrophils Relative %: 58 % (ref 43–77)
RBC: 4.24 MIL/uL (ref 4.22–5.81)

## 2012-06-06 MED ORDER — ONDANSETRON HCL 4 MG/2ML IJ SOLN
4.0000 mg | Freq: Once | INTRAMUSCULAR | Status: AC
  Administered 2012-06-06: 4 mg via INTRAVENOUS
  Filled 2012-06-06: qty 2

## 2012-06-06 MED ORDER — METFORMIN HCL 500 MG PO TABS
1000.0000 mg | ORAL_TABLET | Freq: Two times a day (BID) | ORAL | Status: DC
  Administered 2012-06-07: 1000 mg via ORAL
  Filled 2012-06-06 (×3): qty 2

## 2012-06-06 MED ORDER — ISOSORBIDE MONONITRATE ER 30 MG PO TB24
30.0000 mg | ORAL_TABLET | Freq: Every day | ORAL | Status: DC
  Administered 2012-06-07: 30 mg via ORAL
  Filled 2012-06-06: qty 1

## 2012-06-06 MED ORDER — NEOMYCIN-POLYMYXIN-HC 3.5-10000-1 OT SOLN
4.0000 [drp] | Freq: Four times a day (QID) | OTIC | Status: DC
  Administered 2012-06-07 (×2): 4 [drp] via OTIC
  Filled 2012-06-06 (×3): qty 10

## 2012-06-06 MED ORDER — HYDROCODONE-ACETAMINOPHEN 5-325 MG PO TABS
ORAL_TABLET | ORAL | Status: AC
  Filled 2012-06-06: qty 2

## 2012-06-06 MED ORDER — MORPHINE SULFATE 4 MG/ML IJ SOLN
4.0000 mg | Freq: Once | INTRAMUSCULAR | Status: AC
  Administered 2012-06-06: 4 mg via INTRAVENOUS
  Filled 2012-06-06: qty 1

## 2012-06-06 MED ORDER — ENOXAPARIN SODIUM 40 MG/0.4ML ~~LOC~~ SOLN
40.0000 mg | SUBCUTANEOUS | Status: DC
  Administered 2012-06-07: 40 mg via SUBCUTANEOUS
  Filled 2012-06-06: qty 0.4

## 2012-06-06 MED ORDER — GLIPIZIDE 5 MG PO TABS
5.0000 mg | ORAL_TABLET | Freq: Every day | ORAL | Status: DC
  Administered 2012-06-07: 5 mg via ORAL
  Filled 2012-06-06 (×2): qty 1

## 2012-06-06 MED ORDER — SIMVASTATIN 40 MG PO TABS
40.0000 mg | ORAL_TABLET | Freq: Every day | ORAL | Status: DC
  Filled 2012-06-06: qty 1

## 2012-06-06 MED ORDER — HYDROCODONE-ACETAMINOPHEN 5-325 MG PO TABS
1.0000 | ORAL_TABLET | ORAL | Status: DC | PRN
  Administered 2012-06-06: 23:00:00 via ORAL
  Administered 2012-06-07: 2 via ORAL
  Filled 2012-06-06: qty 2

## 2012-06-06 MED ORDER — VANCOMYCIN HCL IN DEXTROSE 1-5 GM/200ML-% IV SOLN
1000.0000 mg | INTRAVENOUS | Status: DC
  Administered 2012-06-07: 1000 mg via INTRAVENOUS
  Filled 2012-06-06 (×2): qty 200

## 2012-06-06 MED ORDER — SODIUM CHLORIDE 0.9 % IV SOLN
INTRAVENOUS | Status: DC
  Administered 2012-06-06: 20:00:00 via INTRAVENOUS

## 2012-06-06 MED ORDER — SOTALOL HCL 80 MG PO TABS
40.0000 mg | ORAL_TABLET | Freq: Two times a day (BID) | ORAL | Status: DC
  Administered 2012-06-07 (×2): 40 mg via ORAL
  Filled 2012-06-06 (×3): qty 0.5

## 2012-06-06 MED ORDER — ASPIRIN 81 MG PO CHEW
81.0000 mg | CHEWABLE_TABLET | Freq: Every day | ORAL | Status: DC
  Administered 2012-06-07: 81 mg via ORAL
  Filled 2012-06-06: qty 1

## 2012-06-06 MED ORDER — LISINOPRIL 5 MG PO TABS
5.0000 mg | ORAL_TABLET | Freq: Every day | ORAL | Status: DC
  Administered 2012-06-07: 5 mg via ORAL
  Filled 2012-06-06: qty 1

## 2012-06-06 NOTE — ED Provider Notes (Signed)
[name] 8:00 PM patient discussed in sign out with Dr. Devoria Albe. Patient with chronic otitis externa ear infections with failed outpatient treatment several times who also presents with concerns of right knee and low back abscess. X-ray pending of right knee. Plan to consult internal medicine doctors for evaluation and possible admission and observation with IV antibiotics.   Spoke with internal medicine resident. They will comments see patient.   Internal medicine residents have seen and evaluated patient. They have sent cultures of knee drainage at this time feel patient requires admission with observation. They will be placing orders. They also plan to have ENT consultation while in the hospital for ear complaints.     Angus Seller, [place] 06/06/12 2118

## 2012-06-06 NOTE — ED Notes (Signed)
Pt states he has "been draining pus out of my ears" for about 4 months and has been seen by outpatient office. Pt has open area to right knee draining thick yellow/green fluid that pt states started 1 week ago. Red swollen area to lower back noted that pt states "needs to be drained" Pt denies fever or chills

## 2012-06-06 NOTE — ED Notes (Signed)
MD at bedside. Admitting md. 

## 2012-06-06 NOTE — ED Provider Notes (Cosign Needed)
History  This chart was scribed for Ward Givens, MD by Erskine Emery. This patient was seen in room TR09C/TR09C and the patient's care was started at 18:43.   CSN: [id number]  Arrival date & time 06/06/12  1554   None     Chief Complaint  Patient presents with  . Ear Drainage    (Consider location/radiation/quality/duration/timing/severity/associated sxs/prior treatment) HPI  [name] is a 56 y.o. male who presents to the Emergency Department complaining of drainage from both ears for the last 3-4 months. Pt reports going to the [facility] outpatient clinic previously, and was given ear drops and ointment. Pt claims he was going to go back next week for the fifth time for the same complaint but then decided to come in to the ED today. Pt reports associated ear pain. Pt also reports a draining abscess on his right knee for the last week. Pt reports he has been ambulatory with some mild limping that began today. He also has an abscess on his back. Pt denies any associated fevers but has been hot. Pt reports a h/o DM and medication compliance but admits he has not been checking his blood glucose levels.   PCP: [facility]   Past Medical History  Diagnosis Date  . TOBACCO ABUSE 11/25/2006  . CORONARY ARTERY DISEASE 11/25/2006  . Diabetes mellitus   . Hypertension   . Atrial arrhythmia     History reviewed. No pertinent past surgical history.  No family history on file.  History  Substance Use Topics  . Smoking status: Former Smoker -- 0.5 packs/day    Types: Cigarettes    Quit date: 07/26/1979  . Smokeless tobacco: Not on file  . Alcohol Use: No   Lives with spouse   Review of Systems  Constitutional: Negative for fever and chills.  HENT: Positive for ear discharge.   Skin: Positive for wound.    Allergies  Review of patient's allergies indicates no known allergies.  Home Medications   Current Outpatient Rx  Name Route Sig Dispense Refill  .  ASPIRIN 81 MG PO TABS Oral Take 1 tablet (81 mg total) by mouth daily. 90 tablet 1  . FLUOCINONIDE 0.05 % EX CREA Topical Apply topically 2 (two) times daily. Apply to affected areas 30 g 1  . GLIPIZIDE 5 MG PO TABS Oral Take 1 tablet (5 mg total) by mouth daily. 30 tablet 11  . GLUCOSE BLOOD VI STRP Other 1 each by Other route 3 (three) times daily. Use as instructed     . ISOSORBIDE MONONITRATE ER 30 MG PO TB24 Oral Take 1 tablet (30 mg total) by mouth daily. 90 tablet 0  . LANCETS MISC Does not apply by Does not apply route 3 (three) times daily.      Marland Kitchen LISINOPRIL 5 MG PO TABS Oral Take 1 tablet (5 mg total) by mouth daily. 90 tablet 1  . METFORMIN HCL 1000 MG PO TABS Oral Take 1 tablet (1,000 mg total) by mouth 2 (two) times daily with a meal. 180 tablet 1  . NAPROXEN SODIUM 220 MG PO TABS Oral Take 440 mg by mouth 2 (two) times daily with a meal. For pain    . PRAVASTATIN SODIUM 80 MG PO TABS Oral Take 1 tablet (80 mg total) by mouth daily. 90 tablet 1  . SOTALOL HCL 80 MG PO TABS Oral Take 0.5 tablets (40 mg total) by mouth 2 (two) times daily. 180 tablet 1  .  NITROGLYCERIN 0.4 MG SL SUBL Sublingual Place 1 tablet (0.4 mg total) under the tongue every 5 (five) minutes as needed. Do not take more then 3 tablets in 15 min. 30 tablet 1    BP 127/68  Pulse 88  Temp 99.2 F (37.3 C) (Oral)  Resp 14  SpO2 96%  Vital signs normal    Physical Exam  Nursing note and vitals reviewed. Constitutional: He is oriented to person, place, and time. He appears well-developed and well-nourished. No distress.  HENT:  Head: Normocephalic and atraumatic.  Right Ear: There is drainage.  Left Ear: There is drainage.  Mouth/Throat: Oropharynx is clear and moist.       Bilateral ear canals are swollen with a lot of purulent debris. No swelling or redness of the face  Eyes: Conjunctivae and EOM are normal. Pupils are equal, round, and reactive to light.  Neck: Normal range of motion. No tracheal  deviation present.  Cardiovascular: Normal rate.   Pulmonary/Chest: Effort normal and breath sounds normal. No respiratory distress. He has no wheezes. He has no rales.  Musculoskeletal: Normal range of motion.       Legs:      Anterior right knee,erythematous and swollen.  Neurological: He is alert and oriented to person, place, and time.  Skin: Skin is warm and dry. There is erythema.       3 x 4 cm abscess in lower lumbar spine region. Anterior right knee has an abscess that looks like it may have been draining.  Whole right leg is hot to touch.   Psychiatric: He has a normal mood and affect. His behavior is normal.    ED Course  Procedures (including critical care time)  Meds ordered this encounter  Medications  . 0.9 %  sodium chloride infusion    Sig:   . morphine 4 MG/ML injection 4 mg    Sig:   . ondansetron (ZOFRAN) injection 4 mg    Sig:      DIAGNOSTIC STUDIES: Oxygen Saturation is 96% on room air, adequate by my interpretation.    COORDINATION OF CARE:  18:55--I discussed treatment plan including an x-ray of the knee, blood work, and IV antibiotics with pt and pt agreed.  20:10 turned over to PA at change of shift to get rest of results and have OPC see patient.   Results for orders placed during the hospital encounter of 06/06/12  CBC WITH DIFFERENTIAL      Component Value Range   WBC 11.0 (*) 4.0 - 10.5 K/uL   RBC 4.24  4.22 - 5.81 MIL/uL   Hemoglobin 12.2 (*) 13.0 - 17.0 g/dL   HCT 81.1 (*) 91.4 - 78.2 %   MCV 83.7  78.0 - 100.0 fL   MCH 28.8  26.0 - 34.0 pg   MCHC 34.4  30.0 - 36.0 g/dL   RDW 95.6  21.3 - 08.6 %   Platelets 193  150 - 400 K/uL   Neutrophils Relative 58  43 - 77 %   Neutro Abs 6.4  1.7 - 7.7 K/uL   Lymphocytes Relative 34  12 - 46 %   Lymphs Abs 3.7  0.7 - 4.0 K/uL   Monocytes Relative 6  3 - 12 %   Monocytes Absolute 0.7  0.1 - 1.0 K/uL   Eosinophils Relative 2  0 - 5 %   Eosinophils Absolute 0.2  0.0 - 0.7 K/uL   Basophils  Relative 0  0 - 1 %  Basophils Absolute 0.0  0.0 - 0.1 K/uL  GLUCOSE, CAPILLARY      Component Value Range   Glucose-Capillary 103 (*) 70 - 99 mg/dL   Laboratory interpretation all normal except leukocytosis  Knee xray pending, Bmet pending.    1. Abscess of back   2. Abscess of right knee   3. External otitis     Disposition per Orson Slick, PA  MDM  I personally performed the services described in this documentation, which was scribed in my presence. The recorded information has been reviewed and considered.  Devoria Albe, MD, Armando Gang    Ward Givens, MD 06/06/12 2014

## 2012-06-06 NOTE — H&P (Signed)
Hospital Admission Note Date: 06/06/2012  Patient name: [name] Medical record number: [id number] Date of birth: May 06, 1956 Age: 56 y.o. Gender: male PCP: Almyra Deforest, MD  Medical Service: [facility]  Attending physician: Dr. Josem Kaufmann    1st Contact: Dr. Heloise Beecham   Pager: [phone] 2nd Contact: Dr. Allena Katz   Pager: [phone] After 5 pm or weekends: 1st Contact:      Pager: [phone] 2nd Contact:      Pager: [phone]  Chief Complaint: worsening right knee pain  History of Present Illness: [name] states that over the past week he noticed a small pimple on his right knee and he popped it thinking it would drain and resolve. Instead it has increased gradually in size, caused increase pain on flexion and extension, become red and warm. His wife is present and reports that he has been having chills at night. He denied any subjective or measured fevers, no n/v/d/c, abdominal pain, CP, SOB, leg weakness, unstable gait, decreased sensation, weight loss or weight gain. He also stated that around the same time he developed a pimple on his back near L4/L5 that he popped and it is not causing him any pain. He has never had abscesses in the past and has no family history of recurrent abscess. His other main complaint is this chronic otitis externa that has otorrhea of yellow color on his pillow at night. He has been in to see his PCP about this problem and completed a course of cipro, cortisporin drops, and fluocinonide that have all seemed to improve his symptoms but never fully resolved them. He is still complaining of increased pain in his ears bilateral, no tinnitus, decreased hearing, HA, vision changes, or vertigo. Pt is not currently sexually active, has had to new partners recently, never had episode of monoarticular joint pain.   Meds: Current Outpatient Rx  Name Route Sig Dispense Refill  . ASPIRIN 81 MG PO TABS Oral Take 1 tablet (81 mg total) by mouth daily. 90 tablet 1  . FLUOCINONIDE 0.05  % EX CREA Topical Apply topically 2 (two) times daily. Apply to affected areas 30 g 1  . GLIPIZIDE 5 MG PO TABS Oral Take 1 tablet (5 mg total) by mouth daily. 30 tablet 11  . GLUCOSE BLOOD VI STRP Other 1 each by Other route 3 (three) times daily. Use as instructed     . ISOSORBIDE MONONITRATE ER 30 MG PO TB24 Oral Take 1 tablet (30 mg total) by mouth daily. 90 tablet 0  . LANCETS MISC Does not apply by Does not apply route 3 (three) times daily.      Marland Kitchen LISINOPRIL 5 MG PO TABS Oral Take 1 tablet (5 mg total) by mouth daily. 90 tablet 1  . METFORMIN HCL 1000 MG PO TABS Oral Take 1 tablet (1,000 mg total) by mouth 2 (two) times daily with a meal. 180 tablet 1  . NAPROXEN SODIUM 220 MG PO TABS Oral Take 440 mg by mouth 2 (two) times daily with a meal. For pain    . PRAVASTATIN SODIUM 80 MG PO TABS Oral Take 1 tablet (80 mg total) by mouth daily. 90 tablet 1  . SOTALOL HCL 80 MG PO TABS Oral Take 0.5 tablets (40 mg total) by mouth 2 (two) times daily. 180 tablet 1  . NITROGLYCERIN 0.4 MG SL SUBL Sublingual Place 1 tablet (0.4 mg total) under the tongue every 5 (five) minutes as needed. Do not take more then 3 tablets in 15 min.  30 tablet 1    Allergies: Allergies as of 06/06/2012  . (No Known Allergies)   Past Medical History  Diagnosis Date  . TOBACCO ABUSE 11/25/2006  . CORONARY ARTERY DISEASE 11/25/2006  . Diabetes mellitus   . Hypertension   . Atrial arrhythmia    History reviewed. No pertinent past surgical history. No family history on file. History   Social History  . Marital Status: Married    Spouse Name: N/A    Number of Children: N/A  . Years of Education: N/A   Occupational History  . Not on file.   Social History Main Topics  . Smoking status: Former Smoker -- 0.5 packs/day    Types: Cigarettes    Quit date: 07/26/1979  . Smokeless tobacco: Not on file  . Alcohol Use: No  . Drug Use: Not on file  . Sexually Active: Not on file   Other Topics Concern  . Not on  file   Social History Narrative  . No narrative on file    Review of Systems: Pertinent items are noted in HPI.  Physical Exam: Blood pressure 118/77, pulse 70, temperature 98.5 F (36.9 C), temperature source Oral, resp. rate 14, SpO2 98.00%. General: resting in bed in slight discomfort HEENT: PERRL, EOMI, no scleral icterus, TM light reflex present, clear fluid behind, no erythema, some white patches along canal, tenderness to external pressure Cardiac: RRR, normal S1/S2, no S3/S4, rubs, murmurs or gallops Pulm: clear to auscultation bilaterally, moving normal volumes of air Abd: soft, nontender, nondistended, BS present Ext: warm and well perfused, no pedal edema MS: right knee erythematous above patella extending 2-3cm medially and 1 cm laterally in a circular area of erythema, warm to touch, some fluctuance, puss visibly draining from healed scab on patella, some limited ROM 2/2 pain, slight effusion, swollen when compared to L knee, popliteal pulse 2+, dorsalis pedis pulse 2+, sensation intact, on back near L4/L5 scab over indurated tissue about 1cm diameter in circular pattern no fluctuance no erythema no tenderness to palpation. No other abscess or places of indurated tissue on skin.  Neuro: alert and oriented X3, cranial nerves II-XII grossly intact   Lab results: Basic Metabolic Panel:  [facility] 06/06/12 1930  NA 138  K 4.0  CL 101  CO2 25  GLUCOSE 98  BUN 18  CREATININE 0.80  CALCIUM 9.3  MG --  PHOS --  CBC:  Basename 06/06/12 1930  WBC 11.0*  NEUTROABS 6.4  HGB 12.2*  HCT 35.5*  MCV 83.7  PLT 193    Basename 06/06/12 1902  GLUCAP 103*   Imaging results:  Dg Knee Complete 4 Views Right  06/06/2012  *RADIOLOGY REPORT*  Clinical Data: Knee effusion  RIGHT KNEE - COMPLETE 4+ VIEW  Comparison: None.  Findings: No acute fracture and no dislocation.  Prominent soft tissue swelling anterior to the patella and patellar ligament.  IMPRESSION: No acute bony  pathology.  Soft tissue swelling anterior to the patella.  Original Report Authenticated By: Donavan Burnet, M.D.    Assessment & Plan by Problem: 1. Cellulitis of R knee extending over joint- Pt was afebrile on admission, neutrophil predominant leukocytosis 11.0, knee XR showed no acute bony pathology and just soft tissue swelling anterior to the patella. PE concerning for cellulitis, wound did have active site of pus drainage. Must r/o septic arthritis, arthrocentesis was attempted but due to size and location of infection made procedure difficult and was not successful.  -IV vanco -culture of drainage -  Norco 5-325mg  prn for pain -contact -cmet  2. Chronic otitis externa failed outpt tx- Pt has been evaluated several times and failed to resolve symptoms despite compliance to outpt management. Pt doesn't have insurance and has had difficult time arranging consultation with ENT. Pt doesn't seem to have mucor given chronicity of symptoms but must r/o given DM hx. HIV non-reactive 5/13,  -cortisporin otic solution -ENT consult  3. Diabetes- 4/13 last HgbA1c 7.2, Glucose 98 on admission, pt is unaware of BGs at home as he hasn't been checking them. Does state compliance with medications.  -metformin -glipizide 5mg   4. HTN- pt BPs stable on admission 120s/70s -ASA 81mg  -lisinopril 5mg  -imdur 30mg  -sotalol 40mg  BID  5. HLD- last FLP 8/12 LDL 122 -simvastatin 40mg   6. DVT pptx- Lovenox   7. Diet: carb-modified  Signed: Christen Bame 06/06/2012, 10:51 PM

## 2012-06-06 NOTE — Procedures (Signed)
XZAVIER SWINGER DOB:Feb 21, 1956 MRN: [id number]  After consent was obtained and time out performed, using sterile technique the right knee was prepped and 3 ml's of 1% plain Lidocaine used to anesthetize the needle tract into the joint from the lateral  suprapatellar approach. This approach was taken to avoid area of cellulitis. The knee joint was entered and joint space was unable to be acquired likely due to approach and area of cellulitis and concern for seeding. This was attempted 3 times and then procedure was abandoned. No cultures were taken and the area was bandaged upon completion. The procedure was well tolerated.  The patient is asked to continue to rest the knee for a few more days before resuming regular activities.  It may be more painful for the first 1-2 days.  Watch for fever, or increased swelling or persistent pain in knee. Area of cellulitis was outlined and dated for progression/regression of the area. If worsening may consult surgery for re-attempt.  Christen Bame 06/06/2012 11:11 PM  Procedure was supervised by Dr. Josem Kaufmann who was present throughout the procedure.

## 2012-06-06 NOTE — ED Notes (Signed)
Ear drainage both for x 2 mos. Rt. Knee abrasion. And a healing abrasion on lumbar.

## 2012-06-07 ENCOUNTER — Encounter (HOSPITAL_COMMUNITY): Payer: Self-pay | Admitting: General Practice

## 2012-06-07 DIAGNOSIS — L02419 Cutaneous abscess of limb, unspecified: Principal | ICD-10-CM

## 2012-06-07 LAB — COMPREHENSIVE METABOLIC PANEL
ALT: 10 U/L (ref 0–53)
AST: 10 U/L (ref 0–37)
Albumin: 3.1 g/dL — ABNORMAL LOW (ref 3.5–5.2)
Alkaline Phosphatase: 76 U/L (ref 39–117)
BUN: 14 mg/dL (ref 6–23)
CO2: 24 mEq/L (ref 19–32)
Calcium: 8.9 mg/dL (ref 8.4–10.5)
Chloride: 100 mEq/L (ref 96–112)
Creatinine, Ser: 0.71 mg/dL (ref 0.50–1.35)
GFR calc Af Amer: >90 mL/min (ref >90)
GFR calc non Af Amer: >90 mL/min (ref >90)
Glucose, Bld: 122 mg/dL — ABNORMAL HIGH (ref 70–99)
Potassium: 3.8 mEq/L (ref 3.5–5.1)
Sodium: 138 mEq/L (ref 135–145)
Total Bilirubin: 0.2 mg/dL — ABNORMAL LOW (ref 0.3–1.2)
Total Protein: 6.7 g/dL (ref 6.0–8.3)

## 2012-06-07 LAB — CBC
MCH: 28.6 pg (ref 26.0–34.0)
MCHC: 33.7 g/dL (ref 30.0–36.0)
MCV: 84.7 fL (ref 78.0–100.0)
Platelets: 186 10*3/uL (ref 150–400)
RBC: 4.06 MIL/uL — ABNORMAL LOW (ref 4.22–5.81)

## 2012-06-07 LAB — GLUCOSE, CAPILLARY: Glucose-Capillary: 147 mg/dL — ABNORMAL HIGH (ref 70–99)

## 2012-06-07 MED ORDER — HYDROCODONE-ACETAMINOPHEN 5-325 MG PO TABS: 1.0000 | ORAL_TABLET | ORAL | Status: AC | PRN

## 2012-06-07 MED ORDER — DOXYCYCLINE HYCLATE 100 MG PO TABS: 100.0000 mg | ORAL_TABLET | Freq: Two times a day (BID) | ORAL | Status: AC

## 2012-06-07 NOTE — Progress Notes (Signed)
Pt discharged to home after visit summary reviewed and pt capable of re verbalizing medications and follow up appointments. Pt remains stable. No signs and symptoms of distress. Educated to return to ER in the event of SOB, dizziness, chest pain, or fainting. Hayla Hinger, RN   

## 2012-06-07 NOTE — Discharge Summary (Signed)
Internal Medicine [facility] Discharge Note  Name: [name] MRN: [id number] DOB: [birth year] 56 y.o.  Date of Admission: 06/06/2012  4:15 PM Date of Discharge: 06/07/2012 Attending Physician: [name]  Discharge Diagnosis: Principal Problem:  *Cellulitis and abscess of leg, except foot Active Problems:  DIABETES MELLITUS, TYPE II  Hypertension  Otitis externa of both ears   Discharge Medications: Medication List  As of 06/07/2012  8:10 PM   STOP taking these medications         fluocinonide cream 0.05 %         TAKE these medications         aspirin 81 MG tablet   Take 1 tablet (81 mg total) by mouth daily.      doxycycline 100 MG tablet   Commonly known as: VIBRA-TABS   Take 1 tablet (100 mg total) by mouth 2 (two) times daily.      glipiZIDE 5 MG tablet   Commonly known as: GLUCOTROL   Take 1 tablet (5 mg total) by mouth daily.      HYDROcodone-acetaminophen 5-325 MG per tablet   Commonly known as: NORCO   Take 1-2 tablets by mouth every 4 (four) hours as needed.      isosorbide mononitrate 30 MG 24 hr tablet   Commonly known as: IMDUR   Take 1 tablet (30 mg total) by mouth daily.      Lancets Misc   by Does not apply route 3 (three) times daily.      lisinopril 5 MG tablet   Commonly known as: PRINIVIL,ZESTRIL   Take 1 tablet (5 mg total) by mouth daily.      metFORMIN 1000 MG tablet   Commonly known as: GLUCOPHAGE   Take 1 tablet (1,000 mg total) by mouth 2 (two) times daily with a meal.      naproxen sodium 220 MG tablet   Commonly known as: ANAPROX   Take 440 mg by mouth 2 (two) times daily with a meal. For pain      nitroGLYCERIN 0.4 MG SL tablet   Commonly known as: NITROSTAT   Place 1 tablet (0.4 mg total) under the tongue every 5 (five) minutes as needed. Do not take more then 3 tablets in 15 min.      pravastatin 80 MG tablet   Commonly known as: PRAVACHOL   Take 1 tablet (80 mg total) by mouth daily.      sotalol 80  MG tablet   Commonly known as: BETAPACE   Take 0.5 tablets (40 mg total) by mouth 2 (two) times daily.      TRUETRACK TEST test strip   Generic drug: glucose blood   1 each by Other route 3 (three) times daily. Use as instructed            Disposition and follow-up:   [name] was discharged from [facility] in Good condition.  At the hospital follow up visit please address 1) Regression of abscess infection over R knee. Pt should take doxy BID x14 days 2) Pt's HbA1c 7.2, may need tighter glycemic control. 3) follow-up bacterial and fungal cultures of ear canals. ENT will evaluate 06/14/12.  Follow-up Appointments:  Discharge Orders    Future Appointments: Provider: Department: Dept Phone: Center:   06/12/2012 3:15 PM Tyrone Deforest, MD [facility] [phone] [facility]     Future Orders Please Complete By Expires   Diet - low sodium heart healthy  Increase activity slowly      Discharge instructions      Comments:   1. Please take your new antibiotic, doxycycline, twice a day for 14 days. It is very important that you do not miss a dose. Please keep your knee covered with a bandage at all times and refrain from kneeling on that knee. 2. Please return to our clinic Monday, July 15 at 3:00pm for your appointment with Dr. Loistine Moody. She will need to evaluate your knee abscess at this time. 3. Please go to your appointment with the Ear, Nose and Throat doctor, Dr. Lazarus Moody, on Wednesday July 17th at 1:15 pm. Please call Amy at [phone] before your appointment for financial assistance planning. 4. Please seek immediate medical attention if your joint becomes painful, you cannot bend or move your knee, your knee becomes more red and swollen, you spike fevers, or you have nausea and vomiting.   Discharge wound care:      Comments:   Keep your knee bandaged and covered.  Please do not kneel on that knee.   Call MD for:  temperature >100.4      Call MD for:   persistant nausea and vomiting      Call MD for:  severe uncontrolled pain      Call MD for:  redness, tenderness, or signs of infection (pain, swelling, redness, odor or green/yellow discharge around incision site)      Call MD for:  persistant dizziness or light-headedness         Consultations:    Procedures Performed:  Dg Knee Complete 4 Views Right  06/06/2012  *RADIOLOGY REPORT*  Clinical Data: Knee effusion  RIGHT KNEE - COMPLETE 4+ VIEW  Comparison: None.  Findings: No acute fracture and no dislocation.  Prominent soft tissue swelling anterior to the patella and patellar ligament.  IMPRESSION: No acute bony pathology.  Soft tissue swelling anterior to the patella.  Original Report Authenticated By: Donavan Burnet, M.D.    Admission HPI:  [name] states that over the past week he noticed a small pimple on his right knee and he popped it thinking it would drain and resolve. Instead it has increased gradually in size, caused increase pain on flexion and extension, become red and warm. His wife is present and reports that he has been having chills at night. He denied any subjective or measured fevers, no n/v/d/c, abdominal pain, CP, SOB, leg weakness, unstable gait, decreased sensation, weight loss or weight gain. He also stated that around the same time he developed a pimple on his back near L4/L5 that he popped and it is not causing him any pain. He has never had abscesses in the past and has no family history of recurrent abscess. His other main complaint is this chronic otitis externa that has otorrhea of yellow color on his pillow at night. He has been in to see his PCP about this problem and completed a course of cipro, cortisporin drops, and fluocinonide that have all seemed to improve his symptoms but never fully resolved them. He is still complaining of increased pain in his ears bilateral, no tinnitus, decreased hearing, HA, vision changes, or vertigo. Pt is not currently sexually  active, has had to new partners recently, never had episode of monoarticular joint pain.   Hospital Course by problem list:  1. Cellulitis of R knee extending over joint  Pt presented w 3cmx3cm abscess overlying the patella draining purulent fluid with macerated skin breakdown over the  central punctum. Pt was afebrile on admission, neutrophil predominant leukocytosis 11.0, knee XR showed no acute bony pathology and just soft tissue swelling anterior to the patella. PE concerning for cellulitis, wound did have active site of pus drainage. Arthrocentesis was attempted on day of admission but due to size and location of infection made procedure difficult and was not successful. The wound was cultured and he was started on empiric IV vancomycin. The morning after admission swelling had subsided and pt had full ROM of joint with no pain. WBC down trended from 11.0-->8.9 and he remained afebrile. I&D was not performed as no pocket of fluctuance was identified. Arthrocentesis was not attempted again as pt did not demonstrate signs or symptoms of septic joint. In light of abscess regression and reassurance that joint space was not involved by infection, he was discharged from the hospital on day 2 of admission. He was discharged on doxycycline 100mg  BID x14 days and follow-up was arranged in clinic with his PCP Dr.Illath on 06/12/12.  2. Chronic otitis externa failed outpt tx  Pt w chronic otitis externa. Prior to admission, he had been evaluated several times by PCP and symptoms did not improve on multiple therapy trials (cipro, cortisporin, fluocinonide). He is had previously had difficulty obtaining ENT evaluation due to self-pay status. During his admission ENT (Dr.Wolicke) was consulted to evaluate this chronic problem. Per their recommendation, we ordered bacterial and fungal cultures of bilateral ear canals and set up an appt for him to follow-up in their clinic 06/12/12.  3. Diabetes-  Pt w chronic  diabetes, last A1c in April 2013 was 7.2. Good control of BG while inpatient. Continued home metformin and glipizide.   4. HTN-  History of HTN, BPs stable on admission. Continued home lisinopril and imdur.   5. History of Paroxysmal Atrial Fibrillation  Pt w history of paroxysmal A fib and CHADS2 is 2.  Was on coumadin "some time in the past," but now on ASA. Sotalol for rhythm control. Regular rate during admission examinations. Continued home ASA and sotalol.  .  6. HLD  Continued home lipid lowering agent.   7. DVT pptx-  Lovenox    Discharge Vitals:  BP 104/61  Pulse 56  Temp 97.8 F (36.6 C) (Oral)  Resp 16  Ht 6\' 3"  (1.905 m)  Wt 123.2 kg (271 lb 9.7 oz)  BMI 33.95 kg/m2  SpO2 91%  Discharge Day Exam General: resting in bed, NAD  HEENT: Yellow drainage external auditory meatus bilaterally, some tenderness to movement of pinnae. PERRL, EOMI, no scleral icterus  Cardiac: RRR, no rubs, murmurs or gallops  Pulm: clear to auscultation bilaterally, no wheezes, rales, or rhonchi  Abd: soft, nontender, nondistended, BS present  Ext: R knee with receding erythema from pen outline. Indurated, no fluctuance noticed. 2x2cm draining abscess over patellar draining some purulent fluid. Swelling is not TTP. 2+ bilateral DP pulese.  Back - indurated, crusted 1cm abcess draining small amount of purulent fluid. No fluctuance  Neuro: alert and oriented X3, cranial nerves II-XII grossly intact, strength and sensation to light touch equal in bilateral upper and lower extremities  Discharge Labs:  Results for orders placed during the hospital encounter of 06/06/12 (from the past 24 hour(s))  WOUND CULTURE     Status: Normal (Preliminary result)   Collection Time   06/06/12  9:42 PM      Component Value Range   Specimen Description WOUND KNEE RIGHT     Special Requests NONE  Gram Stain       Value: NO WBC SEEN     NO SQUAMOUS EPITHELIAL CELLS SEEN     NO ORGANISMS SEEN   Culture  PENDING     Report Status PENDING    COMPREHENSIVE METABOLIC PANEL     Status: Abnormal   Collection Time   06/07/12  6:20 AM      Component Value Range   Sodium 138  135 - 145 mEq/L   Potassium 3.8  3.5 - 5.1 mEq/L   Chloride 100  96 - 112 mEq/L   CO2 24  19 - 32 mEq/L   Glucose, Bld 122 (*) 70 - 99 mg/dL   BUN 14  6 - 23 mg/dL   Creatinine, Ser 3.08  0.50 - 1.35 mg/dL   Calcium 8.9  8.4 - 65.7 mg/dL   Total Protein 6.7  6.0 - 8.3 g/dL   Albumin 3.1 (*) 3.5 - 5.2 g/dL   AST 10  0 - 37 U/L   ALT 10  0 - 53 U/L   Alkaline Phosphatase 76  39 - 117 U/L   Total Bilirubin 0.2 (*) 0.3 - 1.2 mg/dL   GFR calc non Af Amer >90  >90 mL/min   GFR calc Af Amer >90  >90 mL/min  CBC     Status: Abnormal   Collection Time   06/07/12  6:20 AM      Component Value Range   WBC 8.9  4.0 - 10.5 K/uL   RBC 4.06 (*) 4.22 - 5.81 MIL/uL   Hemoglobin 11.6 (*) 13.0 - 17.0 g/dL   HCT 84.6 (*) 96.2 - 95.2 %   MCV 84.7  78.0 - 100.0 fL   MCH 28.6  26.0 - 34.0 pg   MCHC 33.7  30.0 - 36.0 g/dL   RDW 84.1  32.4 - 40.1 %   Platelets 186  150 - 400 K/uL  GLUCOSE, CAPILLARY     Status: Abnormal   Collection Time   06/07/12  8:50 AM      Component Value Range   Glucose-Capillary 147 (*) 70 - 99 mg/dL   Comment 1 Documented in Chart     Comment 2 Notify RN      Signed: Bronson Curb 06/07/2012, 8:10 PM   Time Spent on Discharge: 40 min

## 2012-06-07 NOTE — Progress Notes (Signed)
Subjective: Pt is standing around room walking when I enter. Just came back to room from cafeteria to get breakfast. Reports no pain in his knee, says he feels "great." Reports swelling and redness of knee has decreased. He has good movement and says the wound on his knee continues to drain pus.  He is concerned about bilateral, chronic yellow drainage and pain from ears and failed therapy for otitis externa.  Denies chest pain, SOB, palpitations, fever/chills, joint pain.   Objective: Vital signs in last 24 hours: Filed Vitals:   06/06/12 2310 06/06/12 2333 06/07/12 0525 06/07/12 1000  BP: 130/67 157/89 104/68 100/62  Pulse: 69 68 60 57  Temp: 98.1 F (36.7 C) 97.4 F (36.3 C) 97.9 F (36.6 C) 97.9 F (36.6 C)  TempSrc: Oral Oral Oral Oral  Resp: 16 16 18 18   Height:  6\' 3"  (1.905 m)    Weight:  123.2 kg (271 lb 9.7 oz)    SpO2: 96% 94% 93% 93%   Weight change:   Intake/Output Summary (Last 24 hours) at 06/07/12 1126 Last data filed at 06/07/12 0900  Gross per 24 hour  Intake    120 ml  Output      0 ml  Net    120 ml   Vitals reviewed. General: resting in bed, NAD HEENT: Yellow drainage external auditory meatus bilaterally, some tenderness to movement of pinnae. PERRL, EOMI, no scleral icterus Cardiac: RRR, no rubs, murmurs or gallops Pulm: clear to auscultation bilaterally, no wheezes, rales, or rhonchi Abd: soft, nontender, nondistended, BS present Ext: R knee with receding erythema from pen outline. Indurated, no fluctuance noticed. 2x2cm draining abscess over patellar draining some purulent fluid. Swelling is not TTP. 2+ bilateral DP pulese. Back - indurated, crusted 1cm abcess draining small amount of purulent fluid.  No fluctuance Neuro: alert and oriented X3, cranial nerves II-XII grossly intact, strength and sensation to light touch equal in bilateral upper and lower extremities  Lab Results: Basic Metabolic Panel:  Lab 06/07/12 9147 06/06/12 1930  NA 138 138    K 3.8 4.0  CL 100 101  CO2 24 25  GLUCOSE 122* 98  BUN 14 18  CREATININE 0.71 0.80  CALCIUM 8.9 9.3  MG -- --  PHOS -- --   Liver Function Tests:  Lab 06/07/12 0620  AST 10  ALT 10  ALKPHOS 76  BILITOT 0.2*  PROT 6.7  ALBUMIN 3.1*   No results found for this basename: LIPASE:2,AMYLASE:2 in the last 168 hours No results found for this basename: AMMONIA:2 in the last 168 hours CBC:  Lab 06/07/12 0620 06/06/12 1930  WBC 8.9 11.0*  NEUTROABS -- 6.4  HGB 11.6* 12.2*  HCT 34.4* 35.5*  MCV 84.7 83.7  PLT 186 193   Cardiac Enzymes: No results found for this basename: CKTOTAL:3,CKMB:3,CKMBINDEX:3,TROPONINI:3 in the last 168 hours BNP: No results found for this basename: PROBNP:3 in the last 168 hours D-Dimer: No results found for this basename: DDIMER:2 in the last 168 hours CBG:  Lab 06/07/12 0850 06/06/12 1902  GLUCAP 147* 103*   Hemoglobin A1C: No results found for this basename: HGBA1C in the last 168 hours Fasting Lipid Panel: No results found for this basename: CHOL,HDL,LDLCALC,TRIG,CHOLHDL,LDLDIRECT in the last 829 hours Thyroid Function Tests: No results found for this basename: TSH,T4TOTAL,FREET4,T3FREE,THYROIDAB in the last 168 hours Coagulation: No results found for this basename: LABPROT:4,INR:4 in the last 168 hours Anemia Panel: No results found for this basename: VITAMINB12,FOLATE,FERRITIN,TIBC,IRON,RETICCTPCT in the last 168 hours  Urine Drug Screen: Drugs of Abuse  No results found for this basename: labopia, cocainscrnur, labbenz, amphetmu, thcu, labbarb    Alcohol Level: No results found for this basename: ETH:2 in the last 168 hours Urinalysis: No results found for this basename: COLORURINE:2,APPERANCEUR:2,LABSPEC:2,PHURINE:2,GLUCOSEU:2,HGBUR:2,BILIRUBINUR:2,KETONESUR:2,PROTEINUR:2,UROBILINOGEN:2,NITRITE:2,LEUKOCYTESUR:2 in the last 168 hours  Micro Results: Recent Results (from the past 240 hour(s))  WOUND CULTURE     Status: Normal  (Preliminary result)   Collection Time   06/06/12  9:42 PM      Component Value Range Status Comment   Specimen Description WOUND KNEE RIGHT   Final    Special Requests NONE   Final    Gram Stain     Final    Value: NO WBC SEEN     NO SQUAMOUS EPITHELIAL CELLS SEEN     NO ORGANISMS SEEN   Culture PENDING   Incomplete    Report Status PENDING   Incomplete    Studies/Results: Dg Knee Complete 4 Views Right  06/06/2012  *RADIOLOGY REPORT*  Clinical Data: Knee effusion  RIGHT KNEE - COMPLETE 4+ VIEW  Comparison: None.  Findings: No acute fracture and no dislocation.  Prominent soft tissue swelling anterior to the patella and patellar ligament.  IMPRESSION: No acute bony pathology.  Soft tissue swelling anterior to the patella.  Original Report Authenticated By: Donavan Burnet, M.D.   Medications: I have reviewed the patient's current medications. Scheduled Meds:   . aspirin  81 mg Oral Daily  . enoxaparin (LOVENOX) injection  40 mg Subcutaneous Q24H  . glipiZIDE  5 mg Oral Q breakfast  . isosorbide mononitrate  30 mg Oral Daily  . lisinopril  5 mg Oral Daily  . metFORMIN  1,000 mg Oral BID WC  .  morphine injection  4 mg Intravenous Once  . neomycin-polymyxin-hydrocortisone  4 drop Both Ears Q6H  . ondansetron (ZOFRAN) IV  4 mg Intravenous Once  . simvastatin  40 mg Oral q1800  . sotalol  40 mg Oral BID  . vancomycin  1,000 mg Intravenous Q24H   Continuous Infusions:   . DISCONTD: sodium chloride 125 mL/hr at 06/06/12 1934   PRN Meds:.HYDROcodone-acetaminophen  Assessment/Plan: 1. Cellulitis of R knee extending over joint- Pt was afebrile on admission, neutrophil predominant leukocytosis 11.0, knee XR showed no acute bony pathology and just soft tissue swelling anterior to the patella. PE concerning for cellulitis, wound did have active site of pus drainage. Arthrocentesis was attempted on day of admission but due to size and location of infection made procedure difficult and was not  successful. This morning swelling has gone down from previous outline and full ROM of joint with no pain. WBC down trended from 11.0-->8.9.  Afebrile and walking around.  -IV vanco. Will consider transition to po abx as infection resolves pending culture data - Wound culture pending -Norco 5-325mg  prn for pain  - No I&D or arthrocentesis at this time as abscess w/o fluctuance and appears to be improving and no joint involvement evident on exam.   2. Chronic otitis externa failed outpt tx- Pt has been evaluated several times and failed to resolve symptoms despite compliance to outpt management (cipro, cortisporin, fluocinonide) Pt doesn't have insurance and has had difficult time arranging consultation with ENT. Pt doesn't seem to have mucor given chronicity of symptoms but must r/o given DM hx. HIV non-reactive 5/13.  -cortisporin otic solution  -ENT consult   3. Diabetes- 4/13 last HgbA1c 7.2, Glucose 98 on admission, pt is unaware of BGs  at home as he hasn't been checking them. Does state compliance with medications.  -metformin  -glipizide 5mg    4. HTN- pt BPs stable on admission 120s/70s. This morning 100/62 -ASA 81mg   -lisinopril 5mg   -imdur 30mg    5. History of Paroxysmal Atrial Fibrillation Pt's CHADS2 is 2, was on coumadin "some time in the past," but now on ASA. Sotalol for rhythm control. Regular rate on examination - Continue home sotalol.  5. HLD-  last FLP 8/12 LDL 122  -simvastatin 40mg    6. DVT pptx- Lovenox   7. Diet: carb-modified    LOS: 1 day   Bronson Curb 06/07/2012, 11:26 AM

## 2012-06-07 NOTE — H&P (Signed)
Internal Medicine Attending Admission Note Date: 06/07/2012  Patient name: [name] Medical record number: [id number] Date of birth: January 10, 1956 Age: 56 y.o. Gender: male  I saw and evaluated the patient. I reviewed the resident's note and I agree with the resident's findings and plan as documented in the resident's note.  Chief Complaint(s): Right knee swelling, redness, and pain for one week.  History - key components related to admission:  [name] is a 56 year old man with diabetes, hypertension, coronary artery disease, and paroxysmal atrial fibrillation who presents with a one-week history of right knee pain, swelling, and redness. He was in his usual state of health until approximately one week ago when he developed a small pimple on his right knee. He popped it and hoped it would drain and resolve. Instead over the next week, it increased in size and caused pain when he moved his knee. He has had chills but denies any fevers, shakes, nausea, chest pain, shortness of breath, or weakness. He does admit to another lesion over the superior cleft of his gluteus which also drained and has now become firm. Another complaint is chronic otorrhea. This has been treated with courses of ciprofloxacin, Corticosporin drops, as well as other steroid drops with no improvement. Has had trouble arranging followup as an outpatient in ENT because of his lack of insurance. He is without other complaints at this time and denies a previous history of skin abscesses.  Physical Exam - key components related to admission:  Filed Vitals:   06/06/12 2333 06/07/12 0525 06/07/12 1000 06/07/12 1344  BP: 157/89 104/68 100/62 104/61  Pulse: 68 60 57 56  Temp: 97.4 F (36.3 C) 97.9 F (36.6 C) 97.9 F (36.6 C) 97.8 F (36.6 C)  TempSrc: Oral Oral Oral Oral  Resp: 16 18 18 16   Height: 6\' 3"  (1.905 m)     Weight: 271 lb 9.7 oz (123.2 kg)     SpO2: 94% 93% 93% 91%   Gen.: Well-developed, well-nourished, man lying  comfortably in bed in no acute distress. HEENT: External ear canals with a whitish yellow exudate. Lungs: Clear to auscultation bilaterally without wheezes, rhonchi, or rales. Heart: Regular rate and rhythm without murmurs, rubs, or gallops. Abdomen: Soft, nontender, active bowel sounds without masses. Back: Firm induration with mild overlying erythema and drainage over the superior gluteal cleft. Extremities: Without edema. Right soft tissue swelling inferior to the patella associated with erythema surrounding the patella and slight warmth. This is improved from the previous night before the vancomycin where there was also soft tissue swelling superior to the patella with more warmth and erythema.  Lab results:  Basic Metabolic Panel:  [facility] 06/07/12 0620 06/06/12 1930  NA 138 138  K 3.8 4.0  CL 100 101  CO2 24 25  GLUCOSE 122* 98  BUN 14 18  CREATININE 0.71 0.80  CALCIUM 8.9 9.3  MG -- --  PHOS -- --   Liver Function Tests:  [facility] 06/07/12 0620  AST 10  ALT 10  ALKPHOS 76  BILITOT 0.2*  PROT 6.7  ALBUMIN 3.1*   CBC:  [facility] 06/07/12 0620 06/06/12 1930  WBC 8.9 11.0*  NEUTROABS -- 6.4  HGB 11.6* 12.2*  HCT 34.4* 35.5*  MCV 84.7 83.7  PLT 186 193   CBG:  [facility] 06/07/12 0850 06/06/12 1902  GLUCAP 147* 103*   Imaging results:  Dg Knee Complete 4 Views Right  06/06/2012  *RADIOLOGY REPORT*  Clinical Data: Knee effusion  RIGHT KNEE -  COMPLETE 4+ VIEW  Comparison: None.  Findings: No acute fracture and no dislocation.  Prominent soft tissue swelling anterior to the patella and patellar ligament.  IMPRESSION: No acute bony pathology.  Soft tissue swelling anterior to the patella.  Original Report Authenticated By: Donavan Burnet, M.D.   Assessment & Plan by Problem:  [name] is a 56 year old man with a history of diabetes who presents with a one-week history of a right knee draining abscess. There does not appear to be extension into the right articular  space. Given the fluctuance and drainage we are most concerned that this is staph. Community MRSA remains high in the differential. His response to IV vancomycin overnight has been remarkable with resolution of some erythema and warmth. He's been able to walk around the hospital without any difficulty or right knee pain.  1) Right knee abscess: We will convert the IV vancomycin to doxycycline 100 mg by mouth for 14 days. The importance of compliance with this regimen was stressed and he was reminded to return to the hospital or call the clinic should there be a worsening in his right knee swelling, warmth and pain. There is no abscess at this point and which to incise and drain.  2) Chronic otitis externa with otorrhea: ENT was called and they were willing to see the patient as an outpatient to further assess his symptoms.  3) Disposition: I agree with the housestaff's plan to discharge [name] today on the above oral antibiotics with followup in the ENT clinic on July 17 and in the Internal Medicine Center with his primary care provider on July 15.

## 2012-06-07 NOTE — Progress Notes (Signed)
Pt transferred from the ED, admitted to Rm 6711. Pt comes from home with wife. He is alert and oriented. Ambulatory with standby assist, slight limp d/t Right knee pain. Wound to Right knee, warm and red with minimal drainage, area of redness outlined previously. Also has wound to lower back, no drainage Oriented to room, instructed to call for assistance before getting out of bed. Resting comfortably at this time, will continue to monitor  [facility]

## 2012-06-09 LAB — WOUND CULTURE

## 2012-06-10 LAB — EAR CULTURE

## 2012-06-12 ENCOUNTER — Ambulatory Visit (INDEPENDENT_AMBULATORY_CARE_PROVIDER_SITE_OTHER): Payer: Self-pay | Admitting: Internal Medicine

## 2012-06-12 ENCOUNTER — Encounter: Payer: Self-pay | Admitting: Internal Medicine

## 2012-06-12 VITALS — BP 117/75 | HR 66 | Temp 97.0°F | Ht 75.0 in | Wt 270.2 lb

## 2012-06-12 DIAGNOSIS — L03119 Cellulitis of unspecified part of limb: Secondary | ICD-10-CM

## 2012-06-12 DIAGNOSIS — I4891 Unspecified atrial fibrillation: Secondary | ICD-10-CM

## 2012-06-12 DIAGNOSIS — H6093 Unspecified otitis externa, bilateral: Secondary | ICD-10-CM

## 2012-06-12 DIAGNOSIS — E785 Hyperlipidemia, unspecified: Secondary | ICD-10-CM

## 2012-06-12 DIAGNOSIS — I10 Essential (primary) hypertension: Secondary | ICD-10-CM

## 2012-06-12 DIAGNOSIS — I2510 Atherosclerotic heart disease of native coronary artery without angina pectoris: Secondary | ICD-10-CM

## 2012-06-12 DIAGNOSIS — H60399 Other infective otitis externa, unspecified ear: Secondary | ICD-10-CM

## 2012-06-12 DIAGNOSIS — Z299 Encounter for prophylactic measures, unspecified: Secondary | ICD-10-CM

## 2012-06-12 DIAGNOSIS — Z79899 Other long term (current) drug therapy: Secondary | ICD-10-CM

## 2012-06-12 DIAGNOSIS — E119 Type 2 diabetes mellitus without complications: Secondary | ICD-10-CM

## 2012-06-12 LAB — LIPID PANEL
Cholesterol: 112 mg/dL (ref 0–200)
HDL: 29 mg/dL — ABNORMAL LOW (ref >39)
LDL Cholesterol: 61 mg/dL (ref 0–99)
Total CHOL/HDL Ratio: 3.9 Ratio
Triglycerides: 108 mg/dL (ref <150)

## 2012-06-12 LAB — GLUCOSE, CAPILLARY: Glucose-Capillary: 102 mg/dL — ABNORMAL HIGH (ref 70–99)

## 2012-06-12 LAB — POCT GLYCOSYLATED HEMOGLOBIN (HGB A1C): Hemoglobin A1C: 7.8

## 2012-06-12 MED ORDER — GLIPIZIDE 10 MG PO TABS: 10.0000 mg | ORAL_TABLET | Freq: Every day | ORAL | Status: DC

## 2012-06-12 MED ORDER — CIPROFLOXACIN HCL 500 MG PO TABS: 500.0000 mg | ORAL_TABLET | Freq: Two times a day (BID) | ORAL | Status: AC

## 2012-06-12 NOTE — Assessment & Plan Note (Signed)
Considering  ear drainage culture obtained during hospitalization which was  notable for Pseudomonas I will treat patient with Cipro 500 mg 3 times a day for 10 days. Patient will further followup with ENT on 7/17.

## 2012-06-12 NOTE — Progress Notes (Signed)
Subjective:   Patient ID: [name] male   DOB: Apr 04, 1956 56 y.o.   MRN: [id number]  HPI: [name] is a 56 y.o. male with past history significant as outlined below who presented to the clinic for a hospital followup. Patient was admitted on 06/06/12 for cellulitis of the knee and was prescribed doxycycline .  1. Cellulitis of the knee: Patient noted that the area has significantly improved at mentioned a small area of drainage. He reports about persistent pain in the area but again significantly improved. Is now able to bend his knee which he was not able to do in the past. He is  taking doxycycline twice a day. He will complete the therapy on 7/20 Denies any fevers or chills.    2. Ear pain: Patient noted the pain is persistent and has not resolved. Patient was evaluated by ENT to hospitalization who recommended outpatient followup. Bacterial and fungal cultures were obtained. Cultures were notable for Pseudomonas sensitive to Cipro. Patient has an ENT appointment on 7/17  Past Medical History  Diagnosis Date  . TOBACCO ABUSE 11/25/2006  . CORONARY ARTERY DISEASE:  - Cardiac cath by Dr Sharyn Lull 11/2003 LV showed good LV systolic function, EF of 55-60%. Left main was patent. LAD has 20-30% mid stenosis. Diagonal 1 and diagonal 2 were patent. Left circumflex was patent. OM1 was less than 0.5 mm which was diffusely diseased. OM2 has 20% ostial stenosis which was patent which  was moderate size. OM3 was patent at prior PTCA and stented site. RCA has 20-30% proximal stenosis and 10-15% mid stenosis. The patient tolerated procedure well. There were no complications. The patient was transferred to recovery room in stable condition. - Myoview 01/2007 No evidence of ischemia or infarct. Ejection fraction 57 %.   11/25/2006  . Hypertension   . Atrial arrhythmia   . Type II diabetes mellitus   . Cellulitis of knee, right 06/06/12  . Chronic otitis externa: Multiple trials with antibiocs and  antifungal. Cultures were positive for Pseudomonas. Follow up with ENT on 06/14/12  06/06/12   Current Outpatient Prescriptions  Medication Sig Dispense Refill  . aspirin 81 MG tablet Take 1 tablet (81 mg total) by mouth daily.  90 tablet  1  . doxycycline (VIBRA-TABS) 100 MG tablet Take 1 tablet (100 mg total) by mouth 2 (two) times daily.  28 tablet  0  . glipiZIDE (GLUCOTROL) 5 MG tablet Take 1 tablet (5 mg total) by mouth daily.  30 tablet  11  . glucose blood (TRUETRACK TEST) test strip 1 each by Other route 3 (three) times daily. Use as instructed       . HYDROcodone-acetaminophen (NORCO) 5-325 MG per tablet Take 1-2 tablets by mouth every 4 (four) hours as needed.  30 tablet  0  . isosorbide mononitrate (IMDUR) 30 MG 24 hr tablet Take 1 tablet (30 mg total) by mouth daily.  90 tablet  0  . Lancets MISC by Does not apply route 3 (three) times daily.        Marland Kitchen lisinopril (PRINIVIL,ZESTRIL) 5 MG tablet Take 1 tablet (5 mg total) by mouth daily.  90 tablet  1  . metFORMIN (GLUCOPHAGE) 1000 MG tablet Take 1 tablet (1,000 mg total) by mouth 2 (two) times daily with a meal.  180 tablet  1  . naproxen sodium (ANAPROX) 220 MG tablet Take 440 mg by mouth 2 (two) times daily with a meal. For pain      . nitroGLYCERIN (  NITROSTAT) 0.4 MG SL tablet Place 1 tablet (0.4 mg total) under the tongue every 5 (five) minutes as needed. Do not take more then 3 tablets in 15 min.  30 tablet  1  . pravastatin (PRAVACHOL) 80 MG tablet Take 1 tablet (80 mg total) by mouth daily.  90 tablet  1  . sotalol (BETAPACE) 80 MG tablet Take 0.5 tablets (40 mg total) by mouth 2 (two) times daily.  180 tablet  1   No family history on file. History   Social History  . Marital Status: Married    Spouse Name: N/A    Number of Children: N/A  . Years of Education: N/A   Social History Main Topics  . Smoking status: Former Smoker -- 0.5 packs/day for 12 years    Types: Cigarettes    Quit date: 07/26/1979  . Smokeless  tobacco: Never Used  . Alcohol Use: No     06/07/12 "did overindulge more than 10 years ago; don't drink at all now"  . Drug Use: No  . Sexually Active: No   Other Topics Concern  . None   Social History Narrative  . None   Review of Systems: Constitutional: Denies fever, chills, diaphoresis, appetite change and fatigue.  HEENT: Noted decreased hearing, ear pain but denies any trouble swallowing, neck pain, neck stiffness  Respiratory: Denies SOB, DOE, cough, chest tightness,  and wheezing.   Cardiovascular: Denies chest pain, palpitations and leg swelling.  Gastrointestinal: Denies nausea, vomiting, abdominal pain, diarrhea, constipation, blood in stool and abdominal distention.  Genitourinary: Denies dysuria, urgency, frequency,     Objective:  Physical Exam: Filed Vitals:   06/12/12 1552  BP: 117/75  Pulse: 66  Temp: 97 F (36.1 C)  TempSrc: Oral  Height: 6\' 3"  (1.905 m)  Weight: 270 lb 3.2 oz (122.562 kg)  SpO2: 98%   Constitutional: Vital signs reviewed.  Patient is a well-developed and well-nourished woman in no acute distress and cooperative with exam. Alert and oriented x3.  Ear: Tenderness of pinnae with movement. Persistent yellow drainage in the external auditory meatus bilaterally.  Neck: Supple Cardiovascular: RRR, S1 normal, S2 normal, no MRG, pulses symmetric and intact bilaterally Pulmonary/Chest: CTAB, no wheezes, rales, or rhonchi Abdominal: Soft. Non-tender, non-distended, bowel sounds are normal,   Neurological: A&O x3,  Skin: Right Knee: 1 cm x 1 cm area of open wound on the anterior aspect of the knee. Small amount of yello drainage present. Tender to palpation. Mild erythema. Not warm to touch.

## 2012-06-12 NOTE — Assessment & Plan Note (Signed)
Hgb A1c not at goal. I will increase glipizide from 5 mg daily to 10 mg daily. Patient does not check his blood sugars on a regular basis. Patient noted he has financial restrictions is not able to afford the meter and strips on a regular basis. He would prefer to spend the money for his medications.

## 2012-06-12 NOTE — Assessment & Plan Note (Signed)
Improving on doxycycline. He will need to complete 7/20. Patient will be reevaluated in one week.  During the next visit please evaluate further improvement.

## 2012-06-12 NOTE — Assessment & Plan Note (Signed)
Well-controlled.  Continue current regimen. 

## 2012-06-12 NOTE — Patient Instructions (Signed)
Please increase your glipizide ( DM medication) to 10 mg daily I am starting you on a new antibiotics: ciprofloxacin for your ears. Take it twice a day for 10 days.

## 2012-06-19 ENCOUNTER — Ambulatory Visit (INDEPENDENT_AMBULATORY_CARE_PROVIDER_SITE_OTHER): Payer: Self-pay | Admitting: Internal Medicine

## 2012-06-19 ENCOUNTER — Encounter: Payer: Self-pay | Admitting: Internal Medicine

## 2012-06-19 VITALS — BP 100/66 | HR 59 | Temp 97.1°F | Ht 75.0 in | Wt 278.3 lb

## 2012-06-19 DIAGNOSIS — H6093 Unspecified otitis externa, bilateral: Secondary | ICD-10-CM

## 2012-06-19 DIAGNOSIS — H60399 Other infective otitis externa, unspecified ear: Secondary | ICD-10-CM

## 2012-06-19 DIAGNOSIS — E785 Hyperlipidemia, unspecified: Secondary | ICD-10-CM

## 2012-06-19 DIAGNOSIS — L03119 Cellulitis of unspecified part of limb: Secondary | ICD-10-CM

## 2012-06-19 DIAGNOSIS — E119 Type 2 diabetes mellitus without complications: Secondary | ICD-10-CM

## 2012-06-19 DIAGNOSIS — L02419 Cutaneous abscess of limb, unspecified: Secondary | ICD-10-CM

## 2012-06-19 MED ORDER — TRAMADOL HCL 50 MG PO TABS: 50.0000 mg | ORAL_TABLET | Freq: Four times a day (QID) | ORAL | Status: DC | PRN

## 2012-06-19 NOTE — Assessment & Plan Note (Signed)
Improved. Has completed antibiotic therapy. Will continue to  Monitor

## 2012-06-19 NOTE — Progress Notes (Signed)
Subjective:   Patient ID: [name] male   DOB: Feb 18, 1956 56 y.o.   MRN: [id number]  HPI: [name] is a 56 y.o. male with PMH significantly as outlined below who presented to the clinic for a followup. Patient was evaluated a week ago for right knee cellulitis. Patient reports drainage and the pain has significantly improved. He also followed up with ENT for his chronic ear pain. Patient reported that was cleaned out and he received medications for it but he noted he still has persistent ear pain.    Past Medical History  Diagnosis Date  . TOBACCO ABUSE 11/25/2006  . CORONARY ARTERY DISEASE 11/25/2006    Cardiac cath by Dr Sharyn Lull 11/2003 LV showed good LV systolic function, EF of 55-60%. Left main was patent. LAD has 20-30% mid stenosis. Diagonal 1 and diagonal 2 were patent. Left circumflex was patent. OM1 was less than 0.5 mm which was diffusely diseased. OM2 has 20% ostial stenosis which was patent which  was moderate size. OM3 was patent at prior PTCA and stented site. RCA has 20-30% proximal   . Hypertension   . Atrial arrhythmia   . Type II diabetes mellitus   . Cellulitis of knee, right 06/06/12  . Chronic otitis externa 06/06/12    C Multiple trials with antibiocs and antifungal. Cultures were positive for Pseudomonas. Follow up with ENT on 06/14/12     Current Outpatient Prescriptions  Medication Sig Dispense Refill  . aspirin 81 MG tablet Take 1 tablet (81 mg total) by mouth daily.  90 tablet  1  . glipiZIDE (GLUCOTROL) 10 MG tablet Take 1 tablet (10 mg total) by mouth daily.  30 tablet  2  . glucose blood (TRUETRACK TEST) test strip 1 each by Other route 3 (three) times daily. Use as instructed       . isosorbide mononitrate (IMDUR) 30 MG 24 hr tablet Take 1 tablet (30 mg total) by mouth daily.  90 tablet  0  . Lancets MISC by Does not apply route 3 (three) times daily.        Marland Kitchen lisinopril (PRINIVIL,ZESTRIL) 5 MG tablet Take 1 tablet (5 mg total) by mouth daily.  90  tablet  1  . metFORMIN (GLUCOPHAGE) 1000 MG tablet Take 1 tablet (1,000 mg total) by mouth 2 (two) times daily with a meal.  180 tablet  1  . naproxen sodium (ANAPROX) 220 MG tablet Take 440 mg by mouth 2 (two) times daily with a meal. For pain      . nitroGLYCERIN (NITROSTAT) 0.4 MG SL tablet Place 1 tablet (0.4 mg total) under the tongue every 5 (five) minutes as needed. Do not take more then 3 tablets in 15 min.  30 tablet  1  . pravastatin (PRAVACHOL) 80 MG tablet Take 1 tablet (80 mg total) by mouth daily.  90 tablet  1  . sotalol (BETAPACE) 80 MG tablet Take 0.5 tablets (40 mg total) by mouth 2 (two) times daily.  180 tablet  1   No family history on file. History   Social History  . Marital Status: Married    Spouse Name: N/A    Number of Children: N/A  . Years of Education: N/A   Social History Main Topics  . Smoking status: Former Smoker -- 0.5 packs/day for 12 years    Types: Cigarettes    Quit date: 07/26/1979  . Smokeless tobacco: Never Used  . Alcohol Use: No     06/07/12 "did  overindulge more than 10 years ago; don't drink at all now"  . Drug Use: No  . Sexually Active: No   Other Topics Concern  . None   Social History Narrative  . None   Review of Systems: Constitutional: Denies fever, chills, diaphoresis, appetite change and fatigue.  HEENT: Noted Ear pain,  Respiratory: Denies SOB, DOE, cough, chest tightness,    Cardiovascular: Denies chest pain, palpitations  Skin: cellulitis improved.    Objective:  Physical Exam: Filed Vitals:   06/19/12 1623  BP: 100/66  Pulse: 59  Temp: 97.1 F (36.2 C)  TempSrc: Oral  Height: 6\' 3"  (1.905 m)  Weight: 278 lb 4.8 oz (126.236 kg)   Constitutional: Vital signs reviewed.  Patient is a well-developed and well-nourished man in no acute distress and cooperative with exam. Alert and oriented x3.  Ear: ear canal erythematous and pain full pina , no more yellow drainage present  Neck: Supple Cardiovascular: RRR, S1  normal, S2 normal, Pulmonary/Chest: CTAB, no wheezes, rales, or rhonchi Abdominal: Soft. Non-tender, non-distended, bowel sounds are normal,  Skin: right knee 1 cm x 1cm area of open wound on the anterior aspect of the knee. No more drainage present. Non-tender. Mild erythema

## 2012-06-19 NOTE — Patient Instructions (Signed)
You can take Acetaminophen 650 mg every 6 hours and Aleve ( 1- 2 tablets) or Ibuprofen ( 1- 2 tablets)  every 6 hours.

## 2012-06-19 NOTE — Assessment & Plan Note (Signed)
LDL is at goal . Will continue current regimen.

## 2012-06-19 NOTE — Assessment & Plan Note (Signed)
Was evaluated by ENT.

## 2012-06-21 ENCOUNTER — Other Ambulatory Visit: Payer: Self-pay | Admitting: *Deleted

## 2012-06-21 DIAGNOSIS — I10 Essential (primary) hypertension: Secondary | ICD-10-CM

## 2012-06-21 DIAGNOSIS — E119 Type 2 diabetes mellitus without complications: Secondary | ICD-10-CM

## 2012-06-21 MED ORDER — LISINOPRIL 5 MG PO TABS: 5.0000 mg | ORAL_TABLET | Freq: Every day | ORAL | Status: DC

## 2012-07-27 ENCOUNTER — Other Ambulatory Visit: Payer: Self-pay | Admitting: *Deleted

## 2012-07-27 DIAGNOSIS — E119 Type 2 diabetes mellitus without complications: Secondary | ICD-10-CM

## 2012-07-28 MED ORDER — METFORMIN HCL 1000 MG PO TABS: 1000.0000 mg | ORAL_TABLET | Freq: Two times a day (BID) | ORAL | Status: DC

## 2012-08-08 ENCOUNTER — Ambulatory Visit (HOSPITAL_COMMUNITY)
Admission: RE | Admit: 2012-08-08 | Discharge: 2012-08-08 | Disposition: A | Discharge status: Home / Self Care | Payer: Self-pay | Source: Ambulatory Visit | Attending: Internal Medicine | Admitting: Internal Medicine

## 2012-08-08 ENCOUNTER — Ambulatory Visit (INDEPENDENT_AMBULATORY_CARE_PROVIDER_SITE_OTHER): Payer: Self-pay | Admitting: Internal Medicine

## 2012-08-08 ENCOUNTER — Encounter: Payer: Self-pay | Admitting: Internal Medicine

## 2012-08-08 VITALS — BP 123/83 | HR 59 | Temp 97.5°F | Ht 75.0 in | Wt 264.5 lb

## 2012-08-08 DIAGNOSIS — M545 Low back pain: Secondary | ICD-10-CM

## 2012-08-08 DIAGNOSIS — Z299 Encounter for prophylactic measures, unspecified: Secondary | ICD-10-CM

## 2012-08-08 DIAGNOSIS — I2510 Atherosclerotic heart disease of native coronary artery without angina pectoris: Secondary | ICD-10-CM

## 2012-08-08 DIAGNOSIS — I498 Other specified cardiac arrhythmias: Secondary | ICD-10-CM | POA: Insufficient documentation

## 2012-08-08 DIAGNOSIS — R079 Chest pain, unspecified: Secondary | ICD-10-CM

## 2012-08-08 DIAGNOSIS — K219 Gastro-esophageal reflux disease without esophagitis: Secondary | ICD-10-CM

## 2012-08-08 DIAGNOSIS — R072 Precordial pain: Secondary | ICD-10-CM | POA: Insufficient documentation

## 2012-08-08 DIAGNOSIS — I10 Essential (primary) hypertension: Secondary | ICD-10-CM

## 2012-08-08 DIAGNOSIS — I4891 Unspecified atrial fibrillation: Secondary | ICD-10-CM

## 2012-08-08 DIAGNOSIS — E119 Type 2 diabetes mellitus without complications: Secondary | ICD-10-CM

## 2012-08-08 DIAGNOSIS — Z720 Tobacco use: Secondary | ICD-10-CM | POA: Insufficient documentation

## 2012-08-08 LAB — GLUCOSE, CAPILLARY: Glucose-Capillary: 169 mg/dL — ABNORMAL HIGH (ref 70–99)

## 2012-08-08 MED ORDER — SUCRALFATE 1 GM/10ML PO SUSP: 1.0000 g | Freq: Four times a day (QID) | ORAL | Status: DC

## 2012-08-08 MED ORDER — OMEPRAZOLE 20 MG PO CPDR: 20.0000 mg | DELAYED_RELEASE_CAPSULE | Freq: Two times a day (BID) | ORAL | Status: DC

## 2012-08-08 NOTE — Assessment & Plan Note (Signed)
Presenting with complaints of burning substernal chest pain x2 weeks worse after eating. Has tried over-the-counter medications including Zantac and Prevacid to no relief. No prior EGDs noted. No insurance at this time. Will likely need GI consultation with upper endoscopy. Denies nausea or vomiting hematemesis or blood in stools or abdominal pain.    -Followup with Ronnell [place] for financial counseling -GI consult when affordable -Return to clinic in one month or if symptoms worsen -Start Prilosec 20 mg twice a day -Start Carafate liquid 4 times a day

## 2012-08-08 NOTE — Progress Notes (Signed)
Subjective:   Patient ID: [name] male   DOB: [birth year] 56 y.o.   MRN: [id number]  HPI: [name] is a 56 y.o. African American male with past medical history of tobacco use currently still smoking, coronary artery disease, hypertension, atrial fibrillation, and type 2 diabetes mellitus presenting to clinic today with complaints of 2 weeks of substernal burning chest pain worse upon eating with no relief with over-the-counter medications of Zantac and Prevacid.  [name] claims he's been having really bad heartburn especially after eating greasy foods or spicy foods and sometimes at night.  The pain is constant and burning in nature spontaneously resolved at times but returns.  He is afraid to eat and has been eating yogurt lately.  Reports 6 pounds weight loss in the past month without trying but is eating less.  He continues to smoke approximately 4-5 cigarettes a day but has not smoked for the past 4 days and is not planning to continue smoking at this time. He has been counseled extensively on smoking cessation today.  Also drinks coffee but has not been drinking coffee lately due to pain complaints.  He denies any nausea, vomiting, fever, chills, headaches, radiation of chest pain to left arm, complaints of chest pressure, abdominal pain, and shortness of breath at this time.  He does admit to taking several Aleve tablets over the past week to try to help with the pain but got no relief.     Past Medical History  Diagnosis Date  . TOBACCO ABUSE 11/25/2006  . CORONARY ARTERY DISEASE 11/25/2006    Cardiac cath by Dr Sharyn Lull 11/2003 LV showed good LV systolic function, EF of 55-60%. Left main was patent. LAD has 20-30% mid stenosis. Diagonal 1 and diagonal 2 were patent. Left circumflex was patent. OM1 was less than 0.5 mm which was diffusely diseased. OM2 has 20% ostial stenosis which was patent which  was moderate size. OM3 was patent at prior PTCA and stented site. RCA has 20-30% proximal    . Hypertension   . Atrial arrhythmia   . Type II diabetes mellitus   . Cellulitis of knee, right 06/06/12  . Chronic otitis externa 06/06/12    C Multiple trials with antibiocs and antifungal. Cultures were positive for Pseudomonas. Follow up with ENT on 06/14/12     Current Outpatient Prescriptions  Medication Sig Dispense Refill  . aspirin 81 MG tablet Take 1 tablet (81 mg total) by mouth daily.  90 tablet  1  . glipiZIDE (GLUCOTROL) 10 MG tablet Take 1 tablet (10 mg total) by mouth daily.  30 tablet  2  . glucose blood (TRUETRACK TEST) test strip 1 each by Other route 3 (three) times daily. Use as instructed       . isosorbide mononitrate (IMDUR) 30 MG 24 hr tablet Take 1 tablet (30 mg total) by mouth daily.  90 tablet  0  . Lancets MISC by Does not apply route 3 (three) times daily.        Marland Kitchen lisinopril (PRINIVIL,ZESTRIL) 5 MG tablet Take 1 tablet (5 mg total) by mouth daily.  90 tablet  0  . metFORMIN (GLUCOPHAGE) 1000 MG tablet Take 1 tablet (1,000 mg total) by mouth 2 (two) times daily with a meal.  180 tablet  0  . naproxen sodium (ANAPROX) 220 MG tablet Take 440 mg by mouth 2 (two) times daily with a meal. For pain      . nitroGLYCERIN (NITROSTAT) 0.4 MG SL tablet  Place 1 tablet (0.4 mg total) under the tongue every 5 (five) minutes as needed. Do not take more then 3 tablets in 15 min.  30 tablet  1  . omeprazole (PRILOSEC) 20 MG capsule Take 1 capsule (20 mg total) by mouth 2 (two) times daily.  60 capsule  1  . pravastatin (PRAVACHOL) 80 MG tablet Take 1 tablet (80 mg total) by mouth daily.  90 tablet  1  . sotalol (BETAPACE) 80 MG tablet Take 0.5 tablets (40 mg total) by mouth 2 (two) times daily.  180 tablet  1  . sucralfate (CARAFATE) 1 GM/10ML suspension Take 10 mLs (1 g total) by mouth 4 (four) times daily.  420 mL  1  . traMADol (ULTRAM) 50 MG tablet Take 1 tablet (50 mg total) by mouth every 6 (six) hours as needed for pain.  20 tablet  0   No family history on file. History     Social History  . Marital Status: Married    Spouse Name: N/A    Number of Children: N/A  . Years of Education: N/A   Social History Main Topics  . Smoking status: Former Smoker -- 0.5 packs/day for 12 years    Types: Cigarettes    Quit date: 07/26/1979  . Smokeless tobacco: Never Used  . Alcohol Use: No     06/07/12 "did overindulge more than 10 years ago; don't drink at all now"  . Drug Use: No  . Sexually Active: No   Other Topics Concern  . None   Social History Narrative  . None   Review of Systems: Constitutional: Denies fever, chills, diaphoresis, appetite change and fatigue.  HEENT: Denies photophobia, eye pain, redness, hearing loss, ear pain, congestion, sore throat, rhinorrhea, sneezing, mouth sores, trouble swallowing, neck pain, neck stiffness and tinnitus.   Respiratory: Denies SOB, DOE, cough, chest tightness,  and wheezing.   Cardiovascular: + substernal burning chest pain. Hx of afib and CAD.  Denies palpitations and leg swelling.  Gastrointestinal: Denies nausea, vomiting, abdominal pain, diarrhea, constipation, blood in stool and abdominal distention.  Genitourinary: Denies dysuria, urgency, frequency, hematuria, flank pain and difficulty urinating.  Musculoskeletal: +back pain. Denies myalgias, , joint swelling, arthralgias and gait problem.  Skin: Denies pallor and wound. + small red bumps on chest and b/l arms after scratching.  Neurological: Denies dizziness, seizures, syncope, weakness, light-headedness, numbness and headaches.  Hematological: Denies adenopathy. Easy bruising, personal or family bleeding history  Psychiatric/Behavioral: Denies suicidal ideation, mood changes, confusion, nervousness, sleep disturbance and agitation  Objective:  Physical Exam: Filed Vitals:   08/08/12 1025  BP: 123/83  Pulse: 59  Temp: 97.5 F (36.4 C)  TempSrc: Oral  Height: 6\' 3"  (1.905 m)  Weight: 264 lb 8 oz (119.976 kg)  SpO2: 96%   Constitutional: Vital  signs reviewed.  Patient is a well-developed and well-nourished male in acute distress secondary to substernal chest pain and cooperative with exam. Alert and oriented x3.  Head: Normocephalic and atraumatic Ear: TM normal bilaterally Mouth: no erythema or exudates, MMM Eyes: PERRLA, EOMI, conjunctivae normal, No scleral icterus.  Neck: Supple, Trachea midline normal ROM, No JVD, mass, thyromegaly, or carotid bruit present.  Cardiovascular: RRR, no MRG, pulses symmetric and intact bilaterally Pulmonary/Chest: + wheezes b/l lower lobes, rales, or rhonchi Abdominal: Soft. Non-tender, non-distended, bowel sounds are normal, no masses, organomegaly, or guarding present.  GU: no CVA tenderness Musculoskeletal: No joint deformities, erythema, or stiffness, ROM full and no nontender. Hematology: no cervical,  inginal, or axillary adenopathy.  Neurological: A&O x3, Strength is normal and symmetric bilaterally, cranial nerve II-XII are grossly intact, no focal motor deficit, sensory intact to light touch bilaterally.  Skin: Warm, dry and intact. No cyanosis, or clubbing. + dry skin, small red irritation bumps on chest and b/l arms in areas of scratching, patches of dry peeling skin on B/L lower extremities. Psychiatric: Normal mood and affect. speech and behavior is normal. Judgment and thought content normal. Cognition and memory are normal.   Assessment & Plan:

## 2012-08-08 NOTE — Assessment & Plan Note (Signed)
Stable at this time.  On naproxen and tramadol

## 2012-08-08 NOTE — Patient Instructions (Signed)
Please follow up with GI as soon as possible for possible EGD  Please take Prilosec as prescribed and carafate  Go to ED or call and come to clinic if chest pain worsens or no improvement  Return to clinic for follow up in 2 weeks or earlier if needed

## 2012-08-08 NOTE — Assessment & Plan Note (Signed)
Referred for orange card and financial counseling  -Will likely need EGD in near future

## 2012-08-08 NOTE — Assessment & Plan Note (Signed)
Presenting with complaints of burning substernal chest pain x2 weeks worse after eating. Has tried over-the-counter medications including Zantac and Prevacid to no relief. No prior EGDs noted. No insurance at this time. Will likely need GI consultation with upper endoscopy. Denies nausea or vomiting hematemesis or blood in stools or abdominal pain.    -Followup with Deborah Hamilton for financial counseling -GI consult when affordable -Return to clinic in one month or if symptoms worsen -Start Prilosec 20 mg twice a day -Start Carafate liquid 4 times a day  

## 2012-08-08 NOTE — Assessment & Plan Note (Signed)
History of stents. Presented today with complaints of substernal burning chest pain.  EKG done in clinic with heart rate 57 beats per minute nonspecific T wave changes in lateral leads as seen in prior EKG.  Chest pain complaints thought to be likely secondary to GI issues versus cardiac.  -Continue to monitor

## 2012-08-08 NOTE — Assessment & Plan Note (Signed)
EKG today showed sinus bradycardia 57 beats per minute with nonspecific T-wave changes.  On sotalol.  -Continue sotalol for now -Continue aspirin -Continue to monitor in clinic and consider reevaluation of atrial fibrillation with PCP

## 2012-08-08 NOTE — Assessment & Plan Note (Signed)
Last A1c 7.8, on glipizide 10 mg daily and metformin 1000 mg twice a day, claims to be adherent medications.  Fingerstick today 169  -Continue glucose control and monitoring as discussed -Followup log of blood sugars -Continue current diabetic medication regimen -Continue to monitor -Followup in clinic in one month -Counseled on exercise and diet control

## 2012-08-08 NOTE — Assessment & Plan Note (Signed)
Stable at this time. Today's blood pressure 123/83.   -Continue lisinopril, IMDUR -Continue to monitor

## 2012-08-09 ENCOUNTER — Other Ambulatory Visit: Payer: Self-pay | Admitting: Internal Medicine

## 2012-08-09 ENCOUNTER — Telehealth: Payer: Self-pay | Admitting: Internal Medicine

## 2012-08-09 DIAGNOSIS — R079 Chest pain, unspecified: Secondary | ICD-10-CM

## 2012-08-09 NOTE — Telephone Encounter (Signed)
Telephone call addendum:  I spoke to [name] on the phone this evening.  I informed him that he will need to get a repeat cxr with nipple markers due to findings of possible nodule/mass vs. Nipple shadow on prior cxr done on day of clinic visit.  We also discussed for him to try to see [name] as soon as possible for assistance so that he may be able to see GI specialist as advised as soon as possible.  I also asked how he was feeling and if any improvement, he said his substernal chest pain has improved slightly however has been having dull abdominal pain all day especially after eating okra today.  I advised him that if pain continues and does not improve or worsens to go to ED immediately.

## 2012-08-10 ENCOUNTER — Telehealth: Payer: Self-pay | Admitting: *Deleted

## 2012-08-10 NOTE — Telephone Encounter (Signed)
Message copied by Hassan Buckler on Thu Aug 10, 2012 11:42 AM ------      Message from: Baltazar Apo      Created: Wed Aug 09, 2012  6:27 PM      Regarding: reschedule mr Knapper for cxr       Dear Rivka Barbara,            I called Mr. Gittins this evening in regards to getting his repeat cxr and also re-ordered it as future encounter in epic.  Please can we reschedule his asap and also for him to be seen by [facility] asap since he really needs to be seeb by GI but has no insurance.            Thank you

## 2012-08-10 NOTE — Telephone Encounter (Signed)
Pt was called to have x-ray done. Talked to pt's wife; informed her pt can come to [facility] anytime, no appt necessary. Stated she will have him come in soon.

## 2012-08-10 NOTE — Progress Notes (Signed)
INTERNAL MEDICINE TEACHING ATTENDING ADDENDUM - Jonah Blue, DO : I personally saw and evaluated [name], [name],  in this clinic visit in conjunction with the resident, Dr. Virgina Organ. I have discussed patient's plan of care with medical resident during this visit. I have confirmed the physical exam findings and have read and agree with the clinic note including the plan.

## 2012-08-14 ENCOUNTER — Emergency Department (HOSPITAL_COMMUNITY): Payer: Self-pay

## 2012-08-14 ENCOUNTER — Encounter (HOSPITAL_COMMUNITY): Payer: Self-pay | Admitting: Emergency Medicine

## 2012-08-14 ENCOUNTER — Inpatient Hospital Stay (HOSPITAL_COMMUNITY)
Admission: EM | Admit: 2012-08-14 | Discharge: 2012-08-16 | DRG: 247 | Disposition: A | Discharge status: Home / Self Care | Payer: Self-pay | Attending: Cardiology | Admitting: Cardiology

## 2012-08-14 DIAGNOSIS — I214 Non-ST elevation (NSTEMI) myocardial infarction: Principal | ICD-10-CM

## 2012-08-14 DIAGNOSIS — Z9861 Coronary angioplasty status: Secondary | ICD-10-CM

## 2012-08-14 DIAGNOSIS — Z955 Presence of coronary angioplasty implant and graft: Secondary | ICD-10-CM

## 2012-08-14 DIAGNOSIS — I10 Essential (primary) hypertension: Secondary | ICD-10-CM | POA: Diagnosis present

## 2012-08-14 DIAGNOSIS — E119 Type 2 diabetes mellitus without complications: Secondary | ICD-10-CM

## 2012-08-14 DIAGNOSIS — Z6832 Body mass index (BMI) 32.0-32.9, adult: Secondary | ICD-10-CM

## 2012-08-14 DIAGNOSIS — I4891 Unspecified atrial fibrillation: Secondary | ICD-10-CM | POA: Diagnosis present

## 2012-08-14 DIAGNOSIS — I2510 Atherosclerotic heart disease of native coronary artery without angina pectoris: Secondary | ICD-10-CM

## 2012-08-14 DIAGNOSIS — K219 Gastro-esophageal reflux disease without esophagitis: Secondary | ICD-10-CM | POA: Diagnosis present

## 2012-08-14 DIAGNOSIS — F17200 Nicotine dependence, unspecified, uncomplicated: Secondary | ICD-10-CM | POA: Diagnosis present

## 2012-08-14 HISTORY — DX: Non-ST elevation (NSTEMI) myocardial infarction: I21.4

## 2012-08-14 LAB — CBC WITH DIFFERENTIAL/PLATELET
Basophils Absolute: 0 10*3/uL (ref 0.0–0.1)
Eosinophils Absolute: 0.2 10*3/uL (ref 0.0–0.7)
Lymphs Abs: 6.3 10*3/uL — ABNORMAL HIGH (ref 0.7–4.0)
MCH: 29 pg (ref 26.0–34.0)
MCV: 82.9 fL (ref 78.0–100.0)
Monocytes Absolute: 0.8 10*3/uL (ref 0.1–1.0)
Neutrophils Relative %: 33 % — ABNORMAL LOW (ref 43–77)
Platelets: 175 10*3/uL (ref 150–400)
RBC: 4.86 MIL/uL (ref 4.22–5.81)
RDW: 14 % (ref 11.5–15.5)

## 2012-08-14 LAB — TROPONIN I
Troponin I: <0.3 ng/mL (ref <0.30)
Troponin I: <0.3 ng/mL (ref <0.30)

## 2012-08-14 LAB — CBC
HCT: 42.1 % (ref 39.0–52.0)
MCV: 82.1 fL (ref 78.0–100.0)
RBC: 5.13 MIL/uL (ref 4.22–5.81)
RDW: 13.7 % (ref 11.5–15.5)
WBC: 6.9 10*3/uL (ref 4.0–10.5)

## 2012-08-14 LAB — COMPREHENSIVE METABOLIC PANEL
AST: 15 U/L (ref 0–37)
Albumin: 3.6 g/dL (ref 3.5–5.2)
BUN: 24 mg/dL — ABNORMAL HIGH (ref 6–23)
Calcium: 9.7 mg/dL (ref 8.4–10.5)
Creatinine, Ser: 0.94 mg/dL (ref 0.50–1.35)
Total Bilirubin: 0.1 mg/dL — ABNORMAL LOW (ref 0.3–1.2)
Total Protein: 7.3 g/dL (ref 6.0–8.3)

## 2012-08-14 LAB — BASIC METABOLIC PANEL
BUN: 23 mg/dL (ref 6–23)
CO2: 24 mEq/L (ref 19–32)
Chloride: 100 mEq/L (ref 96–112)
GFR calc Af Amer: >90 mL/min (ref >90)
Potassium: 3.6 mEq/L (ref 3.5–5.1)

## 2012-08-14 LAB — CK TOTAL AND CKMB (NOT AT ARMC)
CK, MB: 3.3 ng/mL (ref 0.3–4.0)
Relative Index: 2.6 — ABNORMAL HIGH (ref 0.0–2.5)
Total CK: 125 U/L (ref 7–232)

## 2012-08-14 LAB — SURGICAL PCR SCREEN: Staphylococcus aureus: POSITIVE — AB

## 2012-08-14 LAB — GLUCOSE, CAPILLARY: Glucose-Capillary: 122 mg/dL — ABNORMAL HIGH (ref 70–99)

## 2012-08-14 LAB — MAGNESIUM: Magnesium: 1.5 mg/dL (ref 1.5–2.5)

## 2012-08-14 LAB — POCT I-STAT TROPONIN I: Troponin i, poc: 0.22 ng/mL (ref 0.00–0.08)

## 2012-08-14 LAB — PROTIME-INR
INR: 0.94 (ref 0.00–1.49)
Prothrombin Time: 12.8 seconds (ref 11.6–15.2)

## 2012-08-14 LAB — HEPARIN LEVEL (UNFRACTIONATED): Heparin Unfractionated: 0.33 IU/mL (ref 0.30–0.70)

## 2012-08-14 MED ORDER — HEPARIN BOLUS VIA INFUSION
4000.0000 [IU] | Freq: Once | INTRAVENOUS | Status: AC
  Administered 2012-08-14: 4000 [IU] via INTRAVENOUS

## 2012-08-14 MED ORDER — ATORVASTATIN CALCIUM 80 MG PO TABS
80.0000 mg | ORAL_TABLET | Freq: Every day | ORAL | Status: DC
  Administered 2012-08-14: 80 mg via ORAL
  Filled 2012-08-14 (×4): qty 1

## 2012-08-14 MED ORDER — NITROGLYCERIN 0.4 MG SL SUBL: 0.4000 mg | SUBLINGUAL_TABLET | SUBLINGUAL | Status: DC | PRN

## 2012-08-14 MED ORDER — HEPARIN (PORCINE) IN NACL 100-0.45 UNIT/ML-% IJ SOLN
1000.0000 [IU]/h | INTRAMUSCULAR | Status: DC
  Filled 2012-08-14: qty 250

## 2012-08-14 MED ORDER — CLOPIDOGREL BISULFATE 75 MG PO TABS
300.0000 mg | ORAL_TABLET | Freq: Once | ORAL | Status: DC
  Filled 2012-08-14: qty 1

## 2012-08-14 MED ORDER — ONDANSETRON HCL 4 MG/2ML IJ SOLN
4.0000 mg | Freq: Four times a day (QID) | INTRAMUSCULAR | Status: DC | PRN
  Administered 2012-08-14: 4 mg via INTRAVENOUS
  Filled 2012-08-14: qty 2

## 2012-08-14 MED ORDER — ASPIRIN 81 MG PO CHEW
324.0000 mg | CHEWABLE_TABLET | Freq: Once | ORAL | Status: AC
  Administered 2012-08-14: 324 mg via ORAL
  Filled 2012-08-14: qty 4

## 2012-08-14 MED ORDER — ASPIRIN 81 MG PO CHEW: 324.0000 mg | CHEWABLE_TABLET | ORAL | Status: DC

## 2012-08-14 MED ORDER — HEPARIN (PORCINE) IN NACL 100-0.45 UNIT/ML-% IJ SOLN
1400.0000 [IU]/h | INTRAMUSCULAR | Status: DC
  Administered 2012-08-14 – 2012-08-15 (×2): 1400 [IU]/h via INTRAVENOUS
  Filled 2012-08-14 (×3): qty 250

## 2012-08-14 MED ORDER — SODIUM CHLORIDE 0.9 % IV SOLN: 250.0000 mL | INTRAVENOUS | Status: DC | PRN

## 2012-08-14 MED ORDER — SODIUM CHLORIDE 0.9 % IJ SOLN
3.0000 mL | Freq: Two times a day (BID) | INTRAMUSCULAR | Status: DC
  Administered 2012-08-14: 3 mL via INTRAVENOUS

## 2012-08-14 MED ORDER — ASPIRIN EC 81 MG PO TBEC
81.0000 mg | DELAYED_RELEASE_TABLET | Freq: Every day | ORAL | Status: DC
  Administered 2012-08-16: 81 mg via ORAL
  Filled 2012-08-14 (×2): qty 1

## 2012-08-14 MED ORDER — GLIPIZIDE 10 MG PO TABS
10.0000 mg | ORAL_TABLET | Freq: Every day | ORAL | Status: DC
  Administered 2012-08-16: 09:00:00 10 mg via ORAL
  Filled 2012-08-14 (×4): qty 1

## 2012-08-14 MED ORDER — SOTALOL HCL 80 MG PO TABS
40.0000 mg | ORAL_TABLET | Freq: Two times a day (BID) | ORAL | Status: DC
  Administered 2012-08-14 – 2012-08-16 (×3): 40 mg via ORAL
  Filled 2012-08-14 (×6): qty 0.5

## 2012-08-14 MED ORDER — ASPIRIN 81 MG PO TABS: 81.0000 mg | ORAL_TABLET | Freq: Every day | ORAL | Status: DC

## 2012-08-14 MED ORDER — ASPIRIN 300 MG RE SUPP
300.0000 mg | RECTAL | Status: DC
  Filled 2012-08-14: qty 1

## 2012-08-14 MED ORDER — SODIUM CHLORIDE 0.9 % IV SOLN: INTRAVENOUS | Status: DC

## 2012-08-14 MED ORDER — ACETAMINOPHEN 325 MG PO TABS
650.0000 mg | ORAL_TABLET | ORAL | Status: DC | PRN
  Administered 2012-08-14: 650 mg via ORAL
  Filled 2012-08-14: qty 2

## 2012-08-14 MED ORDER — NITROGLYCERIN IN D5W 200-5 MCG/ML-% IV SOLN
5.0000 ug/min | INTRAVENOUS | Status: DC
  Administered 2012-08-14: 5 ug/min via INTRAVENOUS
  Filled 2012-08-14: qty 250

## 2012-08-14 MED ORDER — FAMOTIDINE IN NACL 20-0.9 MG/50ML-% IV SOLN
20.0000 mg | Freq: Two times a day (BID) | INTRAVENOUS | Status: DC
  Administered 2012-08-14 – 2012-08-16 (×3): 20 mg via INTRAVENOUS
  Filled 2012-08-14 (×6): qty 50

## 2012-08-14 MED ORDER — ASPIRIN 81 MG PO CHEW
324.0000 mg | CHEWABLE_TABLET | ORAL | Status: AC
  Administered 2012-08-15: 324 mg via ORAL
  Filled 2012-08-14: qty 4

## 2012-08-14 MED ORDER — DIAZEPAM 5 MG PO TABS
5.0000 mg | ORAL_TABLET | ORAL | Status: AC
  Administered 2012-08-15: 5 mg via ORAL
  Filled 2012-08-14: qty 1

## 2012-08-14 MED ORDER — SODIUM CHLORIDE 0.9 % IJ SOLN: 3.0000 mL | INTRAMUSCULAR | Status: DC | PRN

## 2012-08-14 NOTE — ED Provider Notes (Signed)
History     CSN: [id number]  Arrival date & time 08/14/12  4098   First MD Initiated Contact with Patient 08/14/12 1133      Chief Complaint  Patient presents with  . Chest Pain  . Abdominal Pain    (Consider location/radiation/quality/duration/timing/severity/associated sxs/prior treatment) HPI Pt reports about 3 weeks of intermittent epigastric aching/burning pain radiating into his chest, associated with nausea and diaphoresis but no SOB and worse with eating. Seen in PCP office and given Carafate which has not helped. He has had a particularly severe episode since last night, not relieved with NTG or his other medications and so he came to the ED for eval. He has a history of prior cardiac stent, last cath was in 2007  Past Medical History  Diagnosis Date  . TOBACCO ABUSE 11/25/2006  . CORONARY ARTERY DISEASE 11/25/2006    Cardiac cath by Dr Sharyn Lull 11/2003 LV showed good LV systolic function, EF of 55-60%. Left main was patent. LAD has 20-30% mid stenosis. Diagonal 1 and diagonal 2 were patent. Left circumflex was patent. OM1 was less than 0.5 mm which was diffusely diseased. OM2 has 20% ostial stenosis which was patent which  was moderate size. OM3 was patent at prior PTCA and stented site. RCA has 20-30% proximal   . Hypertension   . Atrial arrhythmia   . Type II diabetes mellitus   . Cellulitis of knee, right 06/06/12  . Chronic otitis externa 06/06/12    C Multiple trials with antibiocs and antifungal. Cultures were positive for Pseudomonas. Follow up with ENT on 06/14/12      Past Surgical History  Procedure Date  . Coronary angioplasty with stent placement ~ 2005    "1"  . Cystectomy 1990's    LUA    History reviewed. No pertinent family history.  History  Substance Use Topics  . Smoking status: Former Smoker -- 0.5 packs/day for 12 years    Types: Cigarettes    Quit date: 07/26/1979  . Smokeless tobacco: Never Used  . Alcohol Use: No     06/07/12 "did overindulge  more than 10 years ago; don't drink at all now"      Review of Systems All other systems reviewed and are negative except as noted in HPI.   Allergies  Review of patient's allergies indicates no known allergies.  Home Medications   Current Outpatient Rx  Name Route Sig Dispense Refill  . ASPIRIN 81 MG PO TABS Oral Take 1 tablet (81 mg total) by mouth daily. 90 tablet 1  . GLIPIZIDE 10 MG PO TABS Oral Take 1 tablet (10 mg total) by mouth daily. 30 tablet 2  . ISOSORBIDE MONONITRATE ER 30 MG PO TB24 Oral Take 1 tablet (30 mg total) by mouth daily. 90 tablet 0  . LISINOPRIL 5 MG PO TABS Oral Take 1 tablet (5 mg total) by mouth daily. 90 tablet 0  . METFORMIN HCL 1000 MG PO TABS Oral Take 1 tablet (1,000 mg total) by mouth 2 (two) times daily with a meal. 180 tablet 0  . NAPROXEN SODIUM 220 MG PO TABS Oral Take 440 mg by mouth 2 (two) times daily with a meal. For pain    . NITROGLYCERIN 0.4 MG SL SUBL Sublingual Place 1 tablet (0.4 mg total) under the tongue every 5 (five) minutes as needed. Do not take more then 3 tablets in 15 min. 30 tablet 1  . OMEPRAZOLE 20 MG PO CPDR Oral Take 1 capsule (20 mg  total) by mouth 2 (two) times daily. 60 capsule 1  . PRAVASTATIN SODIUM 80 MG PO TABS Oral Take 1 tablet (80 mg total) by mouth daily. 90 tablet 1  . SOTALOL HCL 80 MG PO TABS Oral Take 0.5 tablets (40 mg total) by mouth 2 (two) times daily. 180 tablet 1  . SUCRALFATE 1 GM/10ML PO SUSP Oral Take 10 mLs (1 g total) by mouth 4 (four) times daily. 420 mL 1  . GLUCOSE BLOOD VI STRP Other 1 each by Other route 3 (three) times daily. Use as instructed     . LANCETS MISC Does not apply by Does not apply route 3 (three) times daily.        BP 132/69  Pulse 87  Temp 98.2 F (36.8 C) (Oral)  Resp 18  SpO2 96%  Physical Exam  Nursing note and vitals reviewed. Constitutional: He is oriented to person, place, and time. He appears well-developed and well-nourished.  HENT:  Head: Normocephalic and  atraumatic.  Eyes: EOM are normal. Pupils are equal, round, and reactive to light.  Neck: Normal range of motion. Neck supple.  Cardiovascular: Normal rate, normal heart sounds and intact distal pulses.   Pulmonary/Chest: Effort normal and breath sounds normal.  Abdominal: Bowel sounds are normal. He exhibits no distension. There is no tenderness.  Musculoskeletal: Normal range of motion. He exhibits no edema and no tenderness.  Neurological: He is alert and oriented to person, place, and time. He has normal strength. No cranial nerve deficit or sensory deficit.  Skin: Skin is warm and dry. No rash noted.  Psychiatric: He has a normal mood and affect.    ED Course  Procedures (including critical care time)  Labs Reviewed  BASIC METABOLIC PANEL - Abnormal; Notable for the following:    Glucose, Bld 171 (*)     All other components within normal limits  POCT I-STAT TROPONIN I - Abnormal; Notable for the following:    Troponin i, poc 0.22 (*)     All other components within normal limits  CK TOTAL AND CKMB - Abnormal; Notable for the following:    Relative Index 2.6 (*)     All other components within normal limits  SURGICAL PCR SCREEN - Abnormal; Notable for the following:    Staphylococcus aureus POSITIVE (*)     All other components within normal limits  APTT - Abnormal; Notable for the following:    aPTT 53 (*)     All other components within normal limits  CBC WITH DIFFERENTIAL - Abnormal; Notable for the following:    WBC 10.9 (*)     Neutrophils Relative 33 (*)     Lymphocytes Relative 58 (*)     Lymphs Abs 6.3 (*)     All other components within normal limits  COMPREHENSIVE METABOLIC PANEL - Abnormal; Notable for the following:    Glucose, Bld 68 (*)     BUN 24 (*)     Total Bilirubin 0.1 (*)     All other components within normal limits  HEMOGLOBIN A1C - Abnormal; Notable for the following:    Hemoglobin A1C 7.1 (*)     Mean Plasma Glucose 157 (*)     All other  components within normal limits  CBC - Abnormal; Notable for the following:    HCT 38.4 (*)     All other components within normal limits  BASIC METABOLIC PANEL - Abnormal; Notable for the following:    Sodium 134 (*)  Glucose, Bld 102 (*)     All other components within normal limits  GLUCOSE, CAPILLARY - Abnormal; Notable for the following:    Glucose-Capillary 122 (*)     All other components within normal limits  LIPID PANEL - Abnormal; Notable for the following:    HDL 29 (*)     All other components within normal limits  CBC  PROTIME-INR  APTT  HEPARIN LEVEL (UNFRACTIONATED)  TROPONIN I  TROPONIN I  TROPONIN I  TROPONIN I  PROTIME-INR  TSH  MAGNESIUM  MRSA PCR SCREENING  HEPARIN LEVEL (UNFRACTIONATED)   Dg Chest 2 View  08/14/2012  *RADIOLOGY REPORT*  Clinical Data: Chest pain, fever.  History hypertension, diabetes.  CHEST - 2 VIEW  Comparison: 08/08/2012  Findings: The heart is mildly enlarged.  There are no focal consolidations or pleural effusions.  No pulmonary edema. Asymmetric density in the right lower lobe is less apparent.  No evidence for nodular opacity.  IMPRESSION: No evidence for acute cardiopulmonary abnormality.   Original Report Authenticated By: Patterson Hammersmith, M.D.      1. Non-STEMI (non-ST elevated myocardial infarction)   2. Type II or unspecified type diabetes mellitus without mention of complication, not stated as uncontrolled   3. CAD (coronary artery disease)   4. Coronary atherosclerosis of unspecified type of vessel, native or graft       MDM   Date: 08/14/2012  Rate: 84  Rhythm: normal sinus rhythm  QRS Axis: normal  Intervals: normal  ST/T Wave abnormalities: ST depressions inferiorly and ST depressions laterally  Conduction Disutrbances:none  Narrative Interpretation:   Old EKG Reviewed: changes noted; ST depressions are new from last week at the [facility] clinic   New ST changes and positive troponin. Heparin and NTG started,  discussed with Dr. Sharyn Lull who will admit the patient.   CRITICAL CARE Performed by: Pollyann Savoy   Total critical care time:  Critical care time was exclusive of separately billable procedures and treating other patients.  Critical care was necessary to treat or prevent imminent or life-threatening deterioration.  Critical care was time spent personally by me on the following activities: development of treatment plan with patient and/or surrogate as well as nursing, discussions with consultants, evaluation of patient's response to treatment, examination of patient, obtaining history from patient or surrogate, ordering and performing treatments and interventions, ordering and review of laboratory studies, ordering and review of radiographic studies, pulse oximetry and re-evaluation of patient's condition.        Rajeev Escue B. Bernette Mayers, MD 08/15/12 [phone]

## 2012-08-14 NOTE — ED Notes (Signed)
Pt here with left sided CP and abd pain with nausea x 3 weeks; pt sts seen at [facility] for same and given carafate but not helping

## 2012-08-14 NOTE — ED Notes (Signed)
Results of troponin shown to Dr. Clarene Duke and then to nurses [place] and Lillia Abed in [place]

## 2012-08-14 NOTE — Progress Notes (Signed)
ANTICOAGULATION CONSULT NOTE - Follow Up Consult  Pharmacy Consult for Heparin Indication: chest pain/ACS  No Known Allergies  Patient Measurements: Height: 6\' 3"  (190.5 cm) Weight: 255 lb 3.2 oz (115.758 kg) IBW/kg (Calculated) : 84.5  Heparin Dosing Weight: 109 kg  Vital Signs: Temp: 97.9 F (36.6 C) (09/16 1500) Temp src: Oral (09/16 1500) BP: 111/54 mmHg (09/16 1500) Pulse Rate: 62  (09/16 1500)  Labs:  Basename 08/14/12 1925 08/14/12 1508 08/14/12 1305 08/14/12 1213 08/14/12 1040  HGB -- 14.1 -- -- 14.8  HCT -- 40.3 -- -- 42.1  PLT -- 175 -- -- 166  APTT -- 53* -- 26 --  LABPROT -- 12.8 -- 13.4 --  INR -- 0.94 -- 1.00 --  HEPARINUNFRC 0.33 -- -- -- --  CREATININE -- 0.94 -- -- 0.90  CKTOTAL -- -- 125 -- --  CKMB -- -- 3.3 -- --  TROPONINI -- <0.30 <0.30 -- --    Estimated Creatinine Clearance: 121.8 ml/min (by C-G formula based on Cr of 0.94).   Medications:  Infusions:    . sodium chloride    . heparin 1,400 Units/hr (08/14/12 1900)  . nitroGLYCERIN 5 mcg/min (08/14/12 1900)  . DISCONTD: heparin      Assessment: ACS:  Initial heparin level is therapeutic.  For cath 9/17.  Goal of Therapy:  Heparin level 0.3-0.7 units/ml Monitor platelets by anticoagulation protocol: Yes   Plan:  Continue Heparin at 1400 units/hr Recheck Heparin level and CBC with AM labs.  Estella Husk, Pharm.D., BCPS Clinical Pharmacist  Phone [phone] Pager [phone] 08/14/2012, 8:03 PM

## 2012-08-14 NOTE — Progress Notes (Signed)
ANTICOAGULATION CONSULT NOTE - Follow Up Consult  Pharmacy Consult for Heparin Indication: chest pain/ACS  No Known Allergies  Patient Measurements:   Weight: 120 kg Height: 75 inches IBW: 84.5 kg Heparin Dosing Weight: 110 kg  Vital Signs: Temp: 98.2 F (36.8 C) (09/16 1003) Temp src: Oral (09/16 1003) BP: 126/73 mmHg (09/16 1215) Pulse Rate: 67  (09/16 1215)  Labs:  Basename 08/14/12 1040  HGB 14.8  HCT 42.1  PLT 166  APTT --  LABPROT --  INR --  HEPARINUNFRC --  CREATININE 0.90  CKTOTAL --  CKMB --  TROPONINI --    The CrCl is unknown because both a height and weight (above a minimum accepted value) are required for this calculation.   Assessment: 56 y.o. M with cardiac history significant for CAD s/p PCI (~2005) who [facility] the [facility]16 with CP. The first set of cardiac enzymes show a positive troponin of 0.22. Pharmacy has been consulted to initiate heparin while awaiting further cardiac evaluation. Baseline Hgb/Hct/Plt/INR ok. Heparin dosing wt~110 kg  Goal of Therapy:  Heparin level 0.3-0.7 units/ml Monitor platelets by anticoagulation protocol: Yes   Plan:  1. Continue heparin bolus of 4000 units x 1 (as previously ordered by EDP) 2. Initiate heparin drip at rate of 1400 units/hr (14 ml/hr) 3. Daily heparin levels, CBC 4. Will continue to monitor for any signs/symptoms of bleeding and will follow up with heparin level in 6 hours   Georgina Pillion, PharmD, BCPS Clinical Pharmacist Pager: [phone] 08/14/2012 12:50 PM

## 2012-08-14 NOTE — H&P (Signed)
[name] is an 56 y.o. male.   Chief Complaint: Chest pain/abdominal pain HPI: Patient is 56 year old male with past medical history significant for coronary artery disease status post PTCA stenting to OM 3 in the past, hypertension, non-insulin-dependent diabetes mellitus, history of paroxysmal atrial fibrillation, history of tobacco abuse, morbid obesity, degenerative joint disease, came to the ER complaining of recurrent left-sided and retrosternal chest pain radiating to abdominal off and on for last 3 weeks after eating food when 2 outpatient clinic was prescribed Carafate and omeprazole without relief so decided to come to the ER EKG done in the ER showed normal sinus rhythm with ST depression in the inferolateral leads and was noted to have minimally elevated troponin I. patient was started on IV heparin and nitrates in ER with relief of chest pain and abdominal pain. Patient also complains of mild shortness of breath and palpitations off and on denies any vomiting or diaphoresis denies any syncopal episode. Patient denies any recent cardiac workup.  Past Medical History  Diagnosis Date  . TOBACCO ABUSE 11/25/2006  . CORONARY ARTERY DISEASE 11/25/2006    Cardiac cath by Dr Sharyn Lull 11/2003 LV showed good LV systolic function, EF of 55-60%. Left main was patent. LAD has 20-30% mid stenosis. Diagonal 1 and diagonal 2 were patent. Left circumflex was patent. OM1 was less than 0.5 mm which was diffusely diseased. OM2 has 20% ostial stenosis which was patent which  was moderate size. OM3 was patent at prior PTCA and stented site. RCA has 20-30% proximal   . Hypertension   . Atrial arrhythmia   . Type II diabetes mellitus   . Cellulitis of knee, right 06/06/12  . Chronic otitis externa 06/06/12    C Multiple trials with antibiocs and antifungal. Cultures were positive for Pseudomonas. Follow up with ENT on 06/14/12      Past Surgical History  Procedure Date  . Coronary angioplasty with stent  placement ~ 2005    "1"  . Cystectomy 1990's    LUA    History reviewed. No pertinent family history. Social History:  reports that he quit smoking about 33 years ago. His smoking use included Cigarettes. He has a 6 pack-year smoking history. He has never used smokeless tobacco. He reports that he does not drink alcohol or use illicit drugs.  Allergies: No Known Allergies   (Not in a hospital admission)  Results for orders placed during the hospital encounter of 08/14/12 (from the past 48 hour(s))  CBC     Status: Normal   Collection Time   08/14/12 10:40 AM      Component Value Range Comment   WBC 6.9  4.0 - 10.5 K/uL    RBC 5.13  4.22 - 5.81 MIL/uL    Hemoglobin 14.8  13.0 - 17.0 g/dL    HCT 40.9  81.1 - 91.4 %    MCV 82.1  78.0 - 100.0 fL    MCH 28.8  26.0 - 34.0 pg    MCHC 35.2  30.0 - 36.0 g/dL    RDW 78.2  95.6 - 21.3 %    Platelets 166  150 - 400 K/uL   BASIC METABOLIC PANEL     Status: Abnormal   Collection Time   08/14/12 10:40 AM      Component Value Range Comment   Sodium 135  135 - 145 mEq/L    Potassium 3.6  3.5 - 5.1 mEq/L    Chloride 100  96 - 112 mEq/L  CO2 24  19 - 32 mEq/L    Glucose, Bld 171 (*) 70 - 99 mg/dL    BUN 23  6 - 23 mg/dL    Creatinine, Ser 4.54  0.50 - 1.35 mg/dL    Calcium 9.9  8.4 - 09.8 mg/dL    GFR calc non Af Amer >90  >90 mL/min    GFR calc Af Amer >90  >90 mL/min   POCT I-STAT TROPONIN I     Status: Abnormal   Collection Time   08/14/12 11:11 AM      Component Value Range Comment   Troponin i, poc 0.22 (*) 0.00 - 0.08 ng/mL    Comment NOTIFIED PHYSICIAN      Comment 3            PROTIME-INR     Status: Normal   Collection Time   08/14/12 12:13 PM      Component Value Range Comment   Prothrombin Time 13.4  11.6 - 15.2 seconds    INR 1.00  0.00 - 1.49   APTT     Status: Normal   Collection Time   08/14/12 12:13 PM      Component Value Range Comment   aPTT 26  24 - 37 seconds    Dg Chest 2 View  08/14/2012  *RADIOLOGY  REPORT*  Clinical Data: Chest pain, fever.  History hypertension, diabetes.  CHEST - 2 VIEW  Comparison: 08/08/2012  Findings: The heart is mildly enlarged.  There are no focal consolidations or pleural effusions.  No pulmonary edema. Asymmetric density in the right lower lobe is less apparent.  No evidence for nodular opacity.  IMPRESSION: No evidence for acute cardiopulmonary abnormality.   Original Report Authenticated By: Patterson Hammersmith, M.D.     Review of Systems  Constitutional: Negative for fever and chills.  HENT: Negative for neck pain.   Eyes: Negative for blurred vision and double vision.  Respiratory: Negative for cough, hemoptysis and sputum production.   Cardiovascular: Positive for chest pain and palpitations. Negative for orthopnea, claudication, leg swelling and PND.  Gastrointestinal: Positive for abdominal pain. Negative for nausea and vomiting.  Musculoskeletal: Negative for myalgias.  Skin: Negative for rash.  Neurological: Positive for dizziness. Negative for headaches.    Blood pressure 126/73, pulse 67, temperature 98.2 F (36.8 C), temperature source Oral, resp. rate 18, SpO2 95.00%. Physical Exam  Constitutional: He is oriented to person, place, and time. He appears well-developed and well-nourished.  HENT:  Head: Normocephalic and atraumatic.  Nose: Nose normal.  Mouth/Throat: No oropharyngeal exudate.  Eyes: Conjunctivae normal are normal. Pupils are equal, round, and reactive to light. Left eye exhibits no discharge. No scleral icterus.  Neck: Normal range of motion. Neck supple. No JVD present. No tracheal deviation present. No thyromegaly present.  Cardiovascular: Normal rate and regular rhythm.   Murmur (Soft systolic murmur and S4 gallop noted) heard. Respiratory: Effort normal and breath sounds normal. No respiratory distress. He has no wheezes. He has no rales.  GI: Soft. Bowel sounds are normal. He exhibits no distension. There is no tenderness.  There is no rebound.  Musculoskeletal: He exhibits no edema.  Lymphadenopathy:    He has no cervical adenopathy.  Neurological: He is alert and oriented to person, place, and time.     Assessment/Plan Acute non-Q-wave myocardial infarction Coronary artery disease history of PCI to OM 3 in the past Hypertension Non-insulin-dependent diabetes mellitus History of tobacco abuse History of paroxysmal A. fib Degenerative  joint disease Plan As per orders Discussed with patient and his wife regarding elevated cardiac enzymes and left cath possible PTCA stenting its risk and benefits i.e. death MI stroke need for emergency CABG local vascular complications etc. and consents for PCI  [facility] N 08/14/2012, 1:17 PM

## 2012-08-14 NOTE — ED Notes (Signed)
Dr. Sharyn Lull is at the bedside.

## 2012-08-15 ENCOUNTER — Encounter (HOSPITAL_COMMUNITY)
Admission: EM | Disposition: A | Discharge status: Home / Self Care | Payer: Self-pay | Source: Home / Self Care | Attending: Cardiology

## 2012-08-15 ENCOUNTER — Ambulatory Visit (HOSPITAL_COMMUNITY): Admit: 2012-08-15 | Payer: Self-pay | Admitting: Cardiology

## 2012-08-15 HISTORY — PX: LEFT HEART CATHETERIZATION WITH CORONARY ANGIOGRAM: SHX5451

## 2012-08-15 HISTORY — PX: PERCUTANEOUS CORONARY STENT INTERVENTION (PCI-S): SHX5485

## 2012-08-15 LAB — BASIC METABOLIC PANEL
BUN: 20 mg/dL (ref 6–23)
Creatinine, Ser: 0.83 mg/dL (ref 0.50–1.35)
GFR calc Af Amer: >90 mL/min (ref >90)
GFR calc non Af Amer: >90 mL/min (ref >90)
Potassium: 4 mEq/L (ref 3.5–5.1)

## 2012-08-15 LAB — TSH: TSH: 0.849 u[IU]/mL (ref 0.350–4.500)

## 2012-08-15 LAB — TROPONIN I: Troponin I: <0.3 ng/mL (ref <0.30)

## 2012-08-15 LAB — LIPID PANEL
LDL Cholesterol: 67 mg/dL (ref 0–99)
Triglycerides: 71 mg/dL (ref <150)

## 2012-08-15 LAB — CBC
HCT: 38.4 % — ABNORMAL LOW (ref 39.0–52.0)
MCHC: 34.1 g/dL (ref 30.0–36.0)
Platelets: 150 10*3/uL (ref 150–400)
RDW: 14.1 % (ref 11.5–15.5)

## 2012-08-15 LAB — GLUCOSE, CAPILLARY
Glucose-Capillary: 132 mg/dL — ABNORMAL HIGH (ref 70–99)
Glucose-Capillary: 156 mg/dL — ABNORMAL HIGH (ref 70–99)
Glucose-Capillary: 199 mg/dL — ABNORMAL HIGH (ref 70–99)

## 2012-08-15 LAB — HEPARIN LEVEL (UNFRACTIONATED): Heparin Unfractionated: 0.31 IU/mL (ref 0.30–0.70)

## 2012-08-15 LAB — HEMOGLOBIN A1C
Hgb A1c MFr Bld: 7.1 % — ABNORMAL HIGH (ref <5.7)
Mean Plasma Glucose: 157 mg/dL — ABNORMAL HIGH (ref <117)

## 2012-08-15 LAB — PATHOLOGIST SMEAR REVIEW: Path Review: REACTIVE

## 2012-08-15 LAB — POCT ACTIVATED CLOTTING TIME: Activated Clotting Time: 339 seconds

## 2012-08-15 SURGERY — LEFT HEART CATHETERIZATION WITH CORONARY ANGIOGRAM
Anesthesia: LOCAL

## 2012-08-15 MED ORDER — NITROGLYCERIN IN D5W 200-5 MCG/ML-% IV SOLN: 5.0000 ug/min | INTRAVENOUS | Status: DC

## 2012-08-15 MED ORDER — HEPARIN (PORCINE) IN NACL 2-0.9 UNIT/ML-% IJ SOLN
INTRAMUSCULAR | Status: AC
  Filled 2012-08-15: qty 1000

## 2012-08-15 MED ORDER — PRASUGREL HCL 10 MG PO TABS
ORAL_TABLET | ORAL | Status: AC
  Filled 2012-08-15: qty 6

## 2012-08-15 MED ORDER — SODIUM CHLORIDE 0.9 % IV SOLN
INTRAVENOUS | Status: AC
  Administered 2012-08-15: 16:00:00 via INTRAVENOUS

## 2012-08-15 MED ORDER — MIDAZOLAM HCL 2 MG/2ML IJ SOLN
INTRAMUSCULAR | Status: AC
  Filled 2012-08-15: qty 2

## 2012-08-15 MED ORDER — SODIUM CHLORIDE 0.9 % IV SOLN
0.2500 mg/kg/h | INTRAVENOUS | Status: AC
  Filled 2012-08-15: qty 250

## 2012-08-15 MED ORDER — NITROGLYCERIN 0.2 MG/ML ON CALL CATH LAB
INTRAVENOUS | Status: AC
  Filled 2012-08-15: qty 1

## 2012-08-15 MED ORDER — ALUM & MAG HYDROXIDE-SIMETH 200-200-20 MG/5ML PO SUSP
30.0000 mL | ORAL | Status: DC | PRN
  Administered 2012-08-15 – 2012-08-16 (×5): 30 mL via ORAL
  Filled 2012-08-15 (×4): qty 30

## 2012-08-15 MED ORDER — OXYCODONE-ACETAMINOPHEN 5-325 MG PO TABS: 1.0000 | ORAL_TABLET | ORAL | Status: DC | PRN

## 2012-08-15 MED ORDER — MUPIROCIN 2 % EX OINT
TOPICAL_OINTMENT | Freq: Two times a day (BID) | CUTANEOUS | Status: DC
  Administered 2012-08-15 – 2012-08-16 (×2): via NASAL
  Filled 2012-08-15: qty 22

## 2012-08-15 MED ORDER — ACETAMINOPHEN 325 MG PO TABS: 650.0000 mg | ORAL_TABLET | ORAL | Status: DC | PRN

## 2012-08-15 MED ORDER — ONDANSETRON HCL 4 MG/2ML IJ SOLN
4.0000 mg | Freq: Four times a day (QID) | INTRAMUSCULAR | Status: DC | PRN
  Administered 2012-08-16 (×2): 4 mg via INTRAVENOUS
  Filled 2012-08-15 (×2): qty 2

## 2012-08-15 MED ORDER — BIVALIRUDIN 250 MG IV SOLR
INTRAVENOUS | Status: AC
  Filled 2012-08-15: qty 250

## 2012-08-15 MED ORDER — PRASUGREL HCL 10 MG PO TABS
10.0000 mg | ORAL_TABLET | Freq: Every day | ORAL | Status: DC
  Administered 2012-08-16: 10 mg via ORAL
  Filled 2012-08-15 (×2): qty 1

## 2012-08-15 MED ORDER — FENTANYL CITRATE 0.05 MG/ML IJ SOLN
INTRAMUSCULAR | Status: AC
  Filled 2012-08-15: qty 2

## 2012-08-15 MED ORDER — LIDOCAINE HCL (PF) 1 % IJ SOLN
INTRAMUSCULAR | Status: AC
  Filled 2012-08-15: qty 30

## 2012-08-15 MED ORDER — ALUM & MAG HYDROXIDE-SIMETH 200-200-20 MG/5ML PO SUSP
ORAL | Status: AC
  Filled 2012-08-15: qty 30

## 2012-08-15 NOTE — Plan of Care (Signed)
Problem: Phase II Progression Outcomes Goal: Hemodynamically stable Outcome: Completed/Met Date Met:  08/15/12 VSS during sheath pull and on nitroglycerin drip maintained 90-110s/60-70s,  SB/SR 50-60's Goal: Anginal pain relieved Outcome: Completed/Met Date Met:  08/15/12 No chest pain

## 2012-08-15 NOTE — CV Procedure (Signed)
Left cardiac cath/PTCA stenting report dictated on 08/15/2012 dictation number is [id number]

## 2012-08-15 NOTE — Progress Notes (Signed)
Site area: right groin  Site Prior to Removal:  Level 0  Pressure Applied For 20 MINUTES    Minutes Beginning at 1315  Manual:   yes  Patient Status During Pull:  Stable   Post Pull Groin Site:  Level 0  Post Pull Instructions Given:  yes  Post Pull Pulses Present:  yes  Dressing Applied:  yes  Comments:  No bleeding, swelling, hematoma noted. Gauze dressing secured with medipore tape applied

## 2012-08-16 LAB — BASIC METABOLIC PANEL
Calcium: 9.2 mg/dL (ref 8.4–10.5)
Creatinine, Ser: 0.85 mg/dL (ref 0.50–1.35)
GFR calc non Af Amer: >90 mL/min (ref >90)
Glucose, Bld: 135 mg/dL — ABNORMAL HIGH (ref 70–99)
Sodium: 139 mEq/L (ref 135–145)

## 2012-08-16 LAB — CBC
Hemoglobin: 12.5 g/dL — ABNORMAL LOW (ref 13.0–17.0)
MCH: 28.5 pg (ref 26.0–34.0)
MCHC: 33.9 g/dL (ref 30.0–36.0)
MCV: 84.2 fL (ref 78.0–100.0)
Platelets: 137 10*3/uL — ABNORMAL LOW (ref 150–400)
RBC: 4.38 MIL/uL (ref 4.22–5.81)
WBC: 7.5 10*3/uL (ref 4.0–10.5)

## 2012-08-16 LAB — GLUCOSE, CAPILLARY: Glucose-Capillary: 138 mg/dL — ABNORMAL HIGH (ref 70–99)

## 2012-08-16 MED ORDER — METFORMIN HCL 1000 MG PO TABS: 1000.0000 mg | ORAL_TABLET | Freq: Two times a day (BID) | ORAL | Status: DC

## 2012-08-16 MED ORDER — PNEUMOCOCCAL VAC POLYVALENT 25 MCG/0.5ML IJ INJ
0.5000 mL | INJECTION | INTRAMUSCULAR | Status: AC
  Administered 2012-08-16: 0.5 mL via INTRAMUSCULAR
  Filled 2012-08-16: qty 0.5

## 2012-08-16 MED ORDER — SUCRALFATE 1 GM/10ML PO SUSP
1.0000 g | Freq: Four times a day (QID) | ORAL | Status: DC
  Administered 2012-08-16: 12:00:00 1 g via ORAL
  Filled 2012-08-16 (×4): qty 10

## 2012-08-16 MED ORDER — PNEUMOCOCCAL VAC POLYVALENT 25 MCG/0.5ML IJ INJ: 0.5000 mL | INJECTION | INTRAMUSCULAR | Status: DC

## 2012-08-16 MED ORDER — PRASUGREL HCL 10 MG PO TABS: 10.0000 mg | ORAL_TABLET | Freq: Every day | ORAL | Status: DC

## 2012-08-16 MED FILL — Dextrose Inj 5%: INTRAVENOUS | Qty: 1000 | Status: AC

## 2012-08-16 NOTE — Progress Notes (Signed)
CARDIAC REHAB PHASE I   PRE:  Rate/Rhythm: 56 SB  BP:  Supine: 107/62  Sitting:   Standing:    SaO2:   MODE:  Ambulation: 1000 ft   POST:  Rate/Rhythem: 72 SR  BP:  Supine:   Sitting: 126/70  Standing:    SaO2:  0810-0920 Pt tolerated ambulation well without c/o of cp or SOB. VS stable. Pt states that he did not sleep well last night, his stomach hurt and he felt indigestion. With walking no increase of symptoms of indgestion. Pt with c/o after walk of dizziness and sweaty feeling after walking when sitting on side of bed, rechecked BP 116/70.Completed discharge education with pt and wife. He agrees to [employer]. CRP in [place], will send referral. Pt has no insurance gave him financial form to fill out.t seems motivated to making lifestyle changes, wife very supportive.   Beatrix Fetters

## 2012-08-16 NOTE — Plan of Care (Signed)
Problem: Discharge Progression Outcomes Goal: No anginal pain Outcome: Completed/Met Date Met:  08/16/12 No anginal pain noted Goal: Hemodynamically stable Outcome: Completed/Met Date Met:  08/16/12 VSS blood pressure stable and monitor without change Goal: Discharge plan in place and appropriate Outcome: Completed/Met Date Met:  08/16/12 Discharge home today as planned Goal: Vascular site scale level 0 - I Vascular Site Scale Level 0: No bruising/bleeding/hematoma Level I (Mild): Bruising/Ecchymosis, minimal bleeding/ooozing, palpable hematoma < 3 cm Level II (Moderate): Bleeding not affecting hemodynamic parameters, pseudoaneurysm, palpable hematoma > 3 cm  Outcome: Completed/Met Date Met:  08/16/12 Level 0 right groin, no hematoma, swelling or bleeding noted, band aid dry and intact Goal: Tolerates diet Outcome: Completed/Met Date Met:  08/16/12 Patient c/o nausea at breakfast ate some crackers, given zofran and maalox with some relief.Dr. Rockne Coons notified, started on carafate and ate lunch with no complaints prior to discharge. Goal: Activity appropriate for discharge plan Outcome: Completed/Met Date Met:  08/16/12 Ambulating in hall with staff and cardiac rehab. No complaints of chest pain or shortness of breath with activity, tolerating well.

## 2012-08-16 NOTE — Care Management Note (Signed)
    Page 1 of 1   08/16/2012     1:45:02 PM   CARE MANAGEMENT NOTE 08/16/2012  Patient:  [name], [name]   Account Number:  [id number]  Date Initiated:  08/14/2012  Documentation initiated by:  Junius Creamer  Subjective/Objective Assessment:   adm w mi     Action/Plan:   lives w wife, pcp dr Loistine Chance   Anticipated DC Date:  08/16/2012   Anticipated DC Plan:  HOME/SELF CARE  In-house referral  Financial Counselor      DC Planning Services  CM consult  Medication Assistance             Status of service:  Completed, signed off  Discharge Disposition:  HOME/SELF CARE  Per UR Regulation:  Reviewed for med. necessity/level of care/duration of stay   Comments:  08/16/12, Kathi Der RNC-MNN, BSN, [phone], CM received referral.  CM met with pt.  Pt has Effient card.  Pt given info. for [email or web address] as well as Hydrologist, etc.  Call placed to financial counselor, Merry Proud, to talk with pt. 9/16 15:00 debbie dowell rn,bns [phone]

## 2012-08-16 NOTE — Cardiovascular Report (Signed)
[name], [name] NO.:  [id number]  MEDICAL RECORD NO.:  [id number]  LOCATION:  6529                         FACILITY:  [facility]  PHYSICIAN:  Glade Strausser N. Sharyn Lull, M.D. DATE OF BIRTH:  18-Dec-1955  DATE OF PROCEDURE:  08/15/2012 DATE OF DISCHARGE:                           CARDIAC CATHETERIZATION   PROCEDURE:  Left cardiac cath with selective left and right coronary angiography, left ventriculography via right groin using Judkins technique.  INDICATION FOR THE PROCEDURE:  [name] is 56 year old male with past medical history significant for coronary artery disease, status post PTCA, stenting to OM-3 in the past; hypertension; non-insulin-dependent diabetes mellitus; history of paroxysmal atrial fibrillation; history of tobacco abuse; morbid obesity; degenerative joint disease.  He came to the ER complaining of recurrent left-sided and retrosternal chest pain radiating to abdomen off and on for last 3 weeks after eating food, had two outpatient clinic visits described, prescribed Carafate and omeprazole without relief, so decided to come to the ER.  EKG showed normal sinus rhythm with ST depression in inferolateral leads, and was noted to have minimally elevated troponin-I.  The patient was started on IV heparin and nitrates in ER with relief of chest pain and abdominal pain.  The patient also complains of mild shortness of breath and palpitation off and on.  The patient denies any syncopal episode. Denies any nausea, vomiting, or diaphoresis.  The patient denies any recent cardiac workup.  Due to recurrent chest pain, mildly elevated troponin-I and multiple risk factors, discussed with the patient regarding left cath, possible PTCA, stenting, its risks and benefits, i.e., death, MI, stroke, need for emergency CABG, risk of restenosis, local vascular complications, etc., and consented for PCI.  PROCEDURE:  After obtaining the informed consent, the patient  was brought to the Cath Lab and was placed on fluoroscopy table.  Right groin was prepped and draped in usual fashion.  Xylocaine 1% was used for local anesthesia in the right groin.  With the help of thin wall needle, 5-French arterial sheath was placed.  The sheath was aspirated and flushed.  Next, 5-French left Judkins catheter was advanced over the wire under fluoroscopic guidance up to the ascending aorta.  Wire was pulled out, the catheter was aspirated and connected to the Manifold. Catheter was further advanced and engaged into left coronary ostium. Multiple views of the left system were taken.  Next, catheter was disengaged and was pulled out over the wire and was replaced with 5- [place] right Judkins catheter, which was advanced over the wire under fluoroscopic guidance up to the ascending aorta.  Wire was pulled out, the catheter was aspirated and connected to the Manifold.  Catheter was further advanced and engaged into right coronary ostium.  Multiple views of the right system were taken.  Next, the catheter was disengaged and was pulled out over the wire and was replaced with 5-French pigtail catheter, which was advanced over the wire under fluoroscopic guidance up to the ascending aorta.  Wire was pulled out, the catheter was aspirated and connected to the Manifold.  Catheter was further advanced across the aortic valve into the LV.  LV pressures were recorded.  Next,  left ventriculography was done in 30-degree RAO position.  Post- angiographic pressures were recorded from LV and then pullback pressures were recorded from the aorta.  There was no gradient across the aortic valve.  Next, pigtail catheter was pulled out over the wire.  Sheaths were aspirated and flushed.  FINDINGS:  LV showed good LV systolic function, EF of 55-60%.  Left main was patent.  LAD has 10-15% proximal stenosis and 30-35% mid stenosis. Diagonal 1 and 2 were patent.  Ramus was patent.  Left  circumflex has 20- 25% mid stenosis.  OM-1 was less than 0.5 mm.  OM-2 was small, which was patent.  OM-3 had subtotal occlusion inside the stent with TIMI 0, -1 flow.  RCA has 30% proximal stenosis followed by mild aneurysmal dilatation and then 20-25% diffuse mid stenosis.  PDA is small, which is patent.  PLV branch is very small, which is patent.  INTERVENTIONAL PROCEDURE:  Successful PTCA to proximal OM-3 was done using 2.0 x 12-mm long Emerge balloon for predilatation and then 2.5 x 12-mm [place] Trek balloon for predilatation.  Multiple inflations were done followed by 2.5 x 15-mm long Xience Xpedition drug-eluting stent was deployed at 11 atmospheric pressure.  Stent was postdilated using same 2.5 x 12-mm long [place] Trek balloon going up to 18 atmospheric pressure. Lesion dilated from 100% to 0% residual with excellent TIMI grade 3 distal flow without evidence of dissection or distal embolization.  The patient received weight based Angiomax and 60 mg of Effient during the procedure.  The patient tolerated the procedure well.  There were no complications.  The patient was transferred to recovery room in stable condition.     Eduardo Osier. Sharyn Lull, M.D.     MNH/MEDQ  D:  08/15/2012  T:  08/16/2012  Job:  [id number]

## 2012-08-16 NOTE — Discharge Summary (Signed)
  Discharge summary dictated on 08/16/2012 Dictation number is [id number]

## 2012-08-17 NOTE — Discharge Summary (Signed)
NAMEAUSTON, HALFMANN NO.:  [id number]  MEDICAL RECORD NO.:  [id number]  LOCATION:  6529                         FACILITY:  [facility]  PHYSICIAN:  Sparsh Callens N. Sharyn Lull, M.D. DATE OF BIRTH:  06-27-1956  DATE OF ADMISSION:  08/14/2012 DATE OF DISCHARGE:  08/16/2012                              DISCHARGE SUMMARY   ADMITTING DIAGNOSES: 1. Non-Q-wave myocardial infarction. 2. Coronary artery disease, history of percutaneous coronary     intervention to obtuse marginal 3 in the past. 3. Hypertension. 4. Non-insulin-dependent diabetes mellitus. 5. History of tobacco abuse. 6. History of paroxysmal atrial fibrillation. 7. Degenerative joint disease. 8. Gastroesophageal reflux disease.  FINAL DIAGNOSES: 1. Status post acute non-Q-wave myocardial infarction, status post     percutaneous transluminal coronary angioplasty, stenting to     subtotal obtuse marginal 3 occlusion. 2. Coronary artery disease, history of percutaneous coronary     intervention to obtuse marginal 3 in the past. 3. Hypertension. 4. Non-insulin-dependent diabetes mellitus. 5. History of tobacco abuse. 6. History of paroxysmal atrial fibrillation. 7. Degenerative joint disease. 8. Current gastroesophageal reflux disease.  DISCHARGE HOME MEDICATIONS: 1. Enteric-coated aspirin 81 mg 1 tablet daily. 2. Prasugrel 10 mg 1 tablet daily. 3. Glipizide 10 mg 1 tablet daily. 4. Imdur 30 mg 1 tablet daily. 5. Lisinopril 5 mg 1 tablet daily. 6. Metformin 1000 mg twice daily as before, which will be starting     from tomorrow. 7. Nitrostat 0.4 mg sublingual use as directed. 8. Omeprazole 20 mg twice daily. 9. Pravastatin 80 mg daily. 10.Sotalol 80 mg half tablet twice daily. 11.Carafate 1 g 4 times daily.  The patient has been advised to stop     taking Naprosyn.  DIET:  Low-salt, low-cholesterol, 1800 calories, ADA diet.  The patient has been advised to monitor blood sugar and blood pressure daily.   Post-PTCA stent instructions have been given.  Follow up with me in 1 week.  CONDITION AT DISCHARGE:  Stable.  BRIEF HISTORY AND HOSPITAL COURSE:  Mr. Obenchain is a 56 year old male with past medical history significant for coronary artery disease, status post PTCA, stenting to OM 3 in the past; hypertension; non-insulin- dependent diabetes mellitus; history of paroxysmal atrial fibrillation; history of tobacco abuse; morbid obesity; degenerative joint disease. He came to the ER complaining of recurrent left-sided and retrosternal chest pain radiating to abdomen off and on for last 3 weeks after eating food, had two outpatient clinic visits and prescribed Carafate and omeprazole without much relief, so decided to come to the ER.  EKG done in the ER, showed normal sinus rhythm with ST depression in inferolateral leads and was noted to have minimally elevated troponin-I. The patient was started on IV heparin and nitrates in the ER with relief of chest pain and abdominal pain.  Also complains of mild shortness of breath and palpitation, off and on.  Denies any vomiting, diaphoresis. Denies syncopal episode.  Denies any recent cardiac workup.  PHYSICAL EXAMINATION:  VITAL SIGNS:  His blood pressure was 126/73, pulse was 67, he was afebrile. HEENT:  Conjunctiva was pink. NECK:  Supple.  No JVD.  No bruit. LUNGS:  Clear to auscultation  without rhonchi or rales. CARDIOVASCULAR:  S1, S2 was normal.  There was soft systolic murmur and S4 gallop. ABDOMEN:  Soft.  Bowel sounds were present.  Nontender. EXTREMITIES:  There was no clubbing, cyanosis, or edema.  LABORATORY DATA:  Sodium was 137, potassium 4.4, BUN 24, creatinine 0.94.  Hemoglobin was 14.1, hematocrit 40.3, white count of 10.9.  His troponin-I first set in the ER was 0.22, which was elevated.  Repeat three sets of troponin-I were negative.  Cholesterol was 110, HDL was low at 29, LDL 67, triglycerides 71.  His EKG showed  postprocedure sinus bradycardia with nonspecific T-wave changes.  BRIEF HOSPITAL COURSE:  The patient was admitted to Step-Down Unit.  The patient ruled in for non-Q-wave myocardial infarction.  The patient subsequently underwent left cardiac cath with selective left and right coronary angiography, left ventriculography and PTCA stenting to subtotally occluded OM-3 as per procedure report.  The patient tolerated the procedure well.  There were no complications postprocedure.  The patient did not have any episodes of anginal chest pain.  Phase 1 cardiac rehab was called.  The patient has been ambulating in hallway without any problems.  His groin is stable with no evidence of hematoma or bruit.  The patient will be discharged to home on above medications and will be followed up in my office in 1 week.     Eduardo Osier. Sharyn Lull, M.D.     MNH/MEDQ  D:  08/16/2012  T:  08/17/2012  Job:  [id number]

## 2012-09-04 ENCOUNTER — Ambulatory Visit (INDEPENDENT_AMBULATORY_CARE_PROVIDER_SITE_OTHER): Payer: Self-pay | Admitting: Internal Medicine

## 2012-09-04 ENCOUNTER — Encounter: Payer: Self-pay | Admitting: Internal Medicine

## 2012-09-04 VITALS — BP 106/67 | HR 72 | Temp 98.1°F | Ht 75.0 in | Wt 272.4 lb

## 2012-09-04 DIAGNOSIS — M25529 Pain in unspecified elbow: Secondary | ICD-10-CM

## 2012-09-04 DIAGNOSIS — I2510 Atherosclerotic heart disease of native coronary artery without angina pectoris: Secondary | ICD-10-CM

## 2012-09-04 DIAGNOSIS — I214 Non-ST elevation (NSTEMI) myocardial infarction: Secondary | ICD-10-CM

## 2012-09-04 DIAGNOSIS — E119 Type 2 diabetes mellitus without complications: Secondary | ICD-10-CM

## 2012-09-04 LAB — GLUCOSE, CAPILLARY

## 2012-09-04 MED ORDER — ACETAMINOPHEN 325 MG PO TABS: 325.0000 mg | ORAL_TABLET | Freq: Four times a day (QID) | ORAL | Status: DC | PRN

## 2012-09-04 MED ORDER — HYDROCODONE-ACETAMINOPHEN 5-500 MG PO TABS: 1.0000 | ORAL_TABLET | Freq: Two times a day (BID) | ORAL | Status: DC | PRN

## 2012-09-04 MED ORDER — NITROGLYCERIN 0.4 MG SL SUBL: 0.4000 mg | SUBLINGUAL_TABLET | SUBLINGUAL | Status: DC | PRN

## 2012-09-05 ENCOUNTER — Encounter: Payer: Self-pay | Admitting: Internal Medicine

## 2012-09-05 NOTE — Assessment & Plan Note (Addendum)
Unclear of fracture or tendon tear considering patient weight lifting history. Other DD include gout flare or bursitis. Will obtain xray of elbow today and have him back within 1-2 days for a follow up. Per atteningd recommendation gave him vicodin for pain control. NSAID are contraindicated with aspirin and effient as well as GERD. Patient should not get any further refills for his pain.

## 2012-09-05 NOTE — Addendum Note (Signed)
Addended by: Dorie Rank E on: 09/05/2012 06:00 PM   Modules accepted: Orders

## 2012-09-05 NOTE — Progress Notes (Signed)
Subjective:   Patient ID: [name] male   DOB: 08-08-1956 56 y.o.   MRN: [id number]  HPI: [name] is a 56 y.o. male who presented to the clinic for left elbow pain. Patient reports it started few days ago and it had been getting worse and now he can hardy move his arm due to pain , it is swollen and red. He noted that he has been lifting some weights but denies any injury, falls or inject bites. He reports that he had it few years ago and they took some fluid out ? And since then he never had any problems. Denies any fevers or chills.  Patient was admitted on 9/16 for NSTEMI and underwent cath with stenting on 9/17 by Dr Sharyn Lull.  Patient reports that he was scheduled to get an Xray and was experiencing some chest pressure and was transferred to the ED. Since d/c patient denies any chest pain or SOB. Patient noted that he was able to afford the medication    Past Medical History  Diagnosis Date  . TOBACCO ABUSE 11/25/2006  . CORONARY ARTERY DISEASE 11/25/2006    Cardiac cath by Dr Sharyn Lull 11/2003 LV showed good LV systolic function, EF of 55-60%. Left main was patent. LAD has 20-30% mid stenosis. Diagonal 1 and diagonal 2 were patent. Left circumflex was patent. OM1 was less than 0.5 mm which was diffusely diseased. OM2 has 20% ostial stenosis which was patent which  was moderate size. OM3 was patent at prior PTCA and stented site. RCA has 20-30% proximal   . Hypertension   . Atrial arrhythmia   . Type II diabetes mellitus   . Cellulitis of knee, right 06/06/12  . Chronic otitis externa 06/06/12    C Multiple trials with antibiocs and antifungal. Cultures were positive for Pseudomonas. Follow up with ENT on 06/14/12     Current Outpatient Prescriptions  Medication Sig Dispense Refill  . aspirin 81 MG tablet Take 1 tablet (81 mg total) by mouth daily.  90 tablet  1  . glipiZIDE (GLUCOTROL) 10 MG tablet Take 1 tablet (10 mg total) by mouth daily.  30 tablet  2  . glucose blood  (TRUETRACK TEST) test strip 1 each by Other route 3 (three) times daily. Use as instructed       . isosorbide mononitrate (IMDUR) 30 MG 24 hr tablet Take 1 tablet (30 mg total) by mouth daily.  90 tablet  0  . Lancets MISC by Does not apply route 3 (three) times daily.        Marland Kitchen lisinopril (PRINIVIL,ZESTRIL) 5 MG tablet Take 1 tablet (5 mg total) by mouth daily.  90 tablet  0  . metFORMIN (GLUCOPHAGE) 1000 MG tablet Take 1 tablet (1,000 mg total) by mouth 2 (two) times daily with a meal.  180 tablet  3  . nitroGLYCERIN (NITROSTAT) 0.4 MG SL tablet Place 1 tablet (0.4 mg total) under the tongue every 5 (five) minutes as needed. Do not take more then 3 tablets in 15 min.  30 tablet  1  . omeprazole (PRILOSEC) 20 MG capsule Take 1 capsule (20 mg total) by mouth 2 (two) times daily.  60 capsule  1  . prasugrel (EFFIENT) 10 MG TABS Take 1 tablet (10 mg total) by mouth daily.  30 tablet  11  . pravastatin (PRAVACHOL) 80 MG tablet Take 1 tablet (80 mg total) by mouth daily.  90 tablet  1  . sotalol (BETAPACE) 80 MG tablet  Take 0.5 tablets (40 mg total) by mouth 2 (two) times daily.  180 tablet  1  . acetaminophen (TYLENOL) 325 MG tablet Take 1 tablet (325 mg total) by mouth every 6 (six) hours as needed for pain.      Marland Kitchen HYDROcodone-acetaminophen (VICODIN) 5-500 MG per tablet Take 1 tablet by mouth 2 (two) times daily as needed for pain.  10 tablet  0   No family history on file. History   Social History  . Marital Status: Married    Spouse Name: N/A    Number of Children: N/A  . Years of Education: N/A   Social History Main Topics  . Smoking status: Former Smoker -- 0.5 packs/day for 12 years    Types: Cigarettes    Quit date: 07/26/1979  . Smokeless tobacco: Never Used  . Alcohol Use: No  . Drug Use: No  . Sexually Active: None   Other Topics Concern  . None   Social History Narrative  . None   Review of Systems: Bold if positive Constitutional: fever, chills, appetite change and  fatigue.  Respiratory:  SOB, DOE, cough, chest tightness,  and wheezing.   Cardiovascular:  chest pain, palpitations and leg swelling.  Gastrointestinal:  nausea, vomiting, abdominal pain, diarrhea, constipation, blood in stool and abdominal distention.  Genitourinary:  dysuria, urgency, frequency,  pain and difficulty urinating.  Musculoskeletal:   Left elbow pain, swelling    Objective:  Physical Exam: Filed Vitals:   09/04/12 1550  BP: 106/67  Pulse: 72  Temp: 98.1 F (36.7 C)  TempSrc: Oral  Height: 6\' 3"  (1.905 m)  Weight: 272 lb 6.4 oz (123.56 kg)  SpO2: 97%   Constitutional: Vital signs reviewed.  Patient is a well-developed and well-nourished male in no acute distress and cooperative with exam. Alert and oriented x3.  Cardiovascular: RRR, S1 normal, S2 normal, no MRG, pulses symmetric and intact bilaterally Pulmonary/Chest: CTAB, no wheezes, rales, or rhonchi Abdominal: Soft. Non-tender, non-distended, bowel sounds are normal,  Musculoskeletal: Left elbow: tender to palpation, mild swelling, no fluctuation, erythematous, ROM  Decreased due to pain.  Neurological: A&O x3, not able to access strength due to pain his left arm otherwise normal strength.  sensory intact to light touch bilaterally.

## 2012-09-05 NOTE — Addendum Note (Signed)
Addended by: Dorie Rank E on: 09/05/2012 05:30 PM   Modules accepted: Orders

## 2012-09-07 ENCOUNTER — Ambulatory Visit: Payer: Self-pay | Admitting: Internal Medicine

## 2012-09-12 ENCOUNTER — Other Ambulatory Visit: Payer: Self-pay | Admitting: Internal Medicine

## 2012-09-12 DIAGNOSIS — I10 Essential (primary) hypertension: Secondary | ICD-10-CM

## 2012-09-12 DIAGNOSIS — E119 Type 2 diabetes mellitus without complications: Secondary | ICD-10-CM

## 2012-10-05 ENCOUNTER — Inpatient Hospital Stay (HOSPITAL_COMMUNITY): Admission: RE | Admit: 2012-10-05 | Payer: Self-pay | Source: Ambulatory Visit

## 2012-10-09 ENCOUNTER — Ambulatory Visit (HOSPITAL_COMMUNITY): Payer: Self-pay

## 2012-10-11 ENCOUNTER — Ambulatory Visit (HOSPITAL_COMMUNITY): Payer: Self-pay

## 2012-10-12 ENCOUNTER — Ambulatory Visit (HOSPITAL_COMMUNITY): Payer: Self-pay

## 2012-10-13 ENCOUNTER — Ambulatory Visit (HOSPITAL_COMMUNITY): Payer: Self-pay

## 2012-10-16 ENCOUNTER — Ambulatory Visit (HOSPITAL_COMMUNITY): Payer: Self-pay

## 2012-10-18 ENCOUNTER — Ambulatory Visit (HOSPITAL_COMMUNITY): Payer: Self-pay

## 2012-10-20 ENCOUNTER — Ambulatory Visit (HOSPITAL_COMMUNITY): Payer: Self-pay

## 2012-10-23 ENCOUNTER — Ambulatory Visit (HOSPITAL_COMMUNITY): Payer: Self-pay

## 2012-10-25 ENCOUNTER — Ambulatory Visit (HOSPITAL_COMMUNITY): Payer: Self-pay

## 2012-10-30 ENCOUNTER — Ambulatory Visit (HOSPITAL_COMMUNITY): Payer: Self-pay

## 2012-11-01 ENCOUNTER — Ambulatory Visit (HOSPITAL_COMMUNITY): Payer: Self-pay

## 2012-11-03 ENCOUNTER — Ambulatory Visit (HOSPITAL_COMMUNITY): Payer: Self-pay

## 2012-11-06 ENCOUNTER — Ambulatory Visit (HOSPITAL_COMMUNITY): Payer: Self-pay

## 2012-11-08 ENCOUNTER — Ambulatory Visit (HOSPITAL_COMMUNITY): Payer: Self-pay

## 2012-11-10 ENCOUNTER — Other Ambulatory Visit: Payer: Self-pay | Admitting: Internal Medicine

## 2012-11-10 ENCOUNTER — Ambulatory Visit (HOSPITAL_COMMUNITY): Payer: Self-pay

## 2012-11-13 ENCOUNTER — Ambulatory Visit (HOSPITAL_COMMUNITY): Payer: Self-pay

## 2012-11-13 ENCOUNTER — Other Ambulatory Visit: Payer: Self-pay | Admitting: Internal Medicine

## 2012-11-15 ENCOUNTER — Ambulatory Visit (HOSPITAL_COMMUNITY): Payer: Self-pay

## 2012-11-15 ENCOUNTER — Other Ambulatory Visit: Payer: Self-pay | Admitting: *Deleted

## 2012-11-15 DIAGNOSIS — E119 Type 2 diabetes mellitus without complications: Secondary | ICD-10-CM

## 2012-11-15 MED ORDER — GLIPIZIDE 10 MG PO TABS: 10.0000 mg | ORAL_TABLET | Freq: Every day | ORAL | Status: DC

## 2012-11-17 ENCOUNTER — Ambulatory Visit (HOSPITAL_COMMUNITY): Payer: Self-pay

## 2012-11-20 ENCOUNTER — Ambulatory Visit (HOSPITAL_COMMUNITY): Payer: Self-pay

## 2012-11-20 ENCOUNTER — Other Ambulatory Visit: Payer: Self-pay | Admitting: Internal Medicine

## 2012-11-24 ENCOUNTER — Ambulatory Visit (HOSPITAL_COMMUNITY): Payer: Self-pay

## 2012-11-24 NOTE — Telephone Encounter (Signed)
Please find out what problem the patient is treating with the triamcinolone cream.  This is not on the active med list.

## 2012-11-24 NOTE — Telephone Encounter (Signed)
Agree with plan 

## 2012-11-24 NOTE — Telephone Encounter (Signed)
Spoke w/ pt, he has recurring rash on both ears and scalp around ears, as this occurs he uses the cream to help so he likes to keep some on hand, it clears the rash up. i have made him an appt to discuss this thurs 11/30/2012 at 1345 dr Dorthula Rue, he is agreeable. He also states he does not have the rash at present

## 2012-11-24 NOTE — Telephone Encounter (Signed)
i have called both ph#'s, able to leave message at home # and i did to call clinic back, unable to lm at cell #, i did look back in chart, this med was prescribed over a year ago for scalp and ear, when asked in sept 2013 pt stated he was not using. Will await his call

## 2012-11-27 ENCOUNTER — Ambulatory Visit (HOSPITAL_COMMUNITY): Payer: Self-pay

## 2012-11-27 ENCOUNTER — Other Ambulatory Visit: Payer: Self-pay | Admitting: Internal Medicine

## 2012-11-30 ENCOUNTER — Ambulatory Visit: Payer: Self-pay | Admitting: Internal Medicine

## 2012-12-01 ENCOUNTER — Ambulatory Visit (HOSPITAL_COMMUNITY): Payer: Self-pay

## 2012-12-04 ENCOUNTER — Ambulatory Visit (HOSPITAL_COMMUNITY): Payer: Self-pay

## 2012-12-06 ENCOUNTER — Ambulatory Visit (HOSPITAL_COMMUNITY): Payer: Self-pay

## 2012-12-08 ENCOUNTER — Ambulatory Visit (HOSPITAL_COMMUNITY): Payer: Self-pay

## 2012-12-10 ENCOUNTER — Other Ambulatory Visit: Payer: Self-pay | Admitting: Internal Medicine

## 2012-12-11 ENCOUNTER — Ambulatory Visit (HOSPITAL_COMMUNITY): Payer: Self-pay

## 2012-12-11 ENCOUNTER — Other Ambulatory Visit: Payer: Self-pay | Admitting: *Deleted

## 2012-12-11 DIAGNOSIS — I10 Essential (primary) hypertension: Secondary | ICD-10-CM

## 2012-12-11 MED ORDER — LISINOPRIL 5 MG PO TABS: 5.0000 mg | ORAL_TABLET | Freq: Every day | ORAL | Status: DC

## 2012-12-13 ENCOUNTER — Ambulatory Visit (HOSPITAL_COMMUNITY): Payer: Self-pay

## 2012-12-15 ENCOUNTER — Ambulatory Visit (HOSPITAL_COMMUNITY): Payer: Self-pay

## 2012-12-18 ENCOUNTER — Ambulatory Visit (HOSPITAL_COMMUNITY): Payer: Self-pay

## 2012-12-20 ENCOUNTER — Ambulatory Visit (HOSPITAL_COMMUNITY): Payer: Self-pay

## 2012-12-22 ENCOUNTER — Ambulatory Visit (HOSPITAL_COMMUNITY): Payer: Self-pay

## 2012-12-25 ENCOUNTER — Ambulatory Visit (HOSPITAL_COMMUNITY): Payer: Self-pay

## 2012-12-25 ENCOUNTER — Other Ambulatory Visit: Payer: Self-pay | Admitting: Internal Medicine

## 2012-12-27 ENCOUNTER — Ambulatory Visit (HOSPITAL_COMMUNITY): Payer: Self-pay

## 2012-12-29 ENCOUNTER — Ambulatory Visit (HOSPITAL_COMMUNITY): Payer: Self-pay

## 2013-01-01 ENCOUNTER — Ambulatory Visit (HOSPITAL_COMMUNITY): Payer: Self-pay

## 2013-01-03 ENCOUNTER — Ambulatory Visit (HOSPITAL_COMMUNITY): Payer: Self-pay

## 2013-01-05 ENCOUNTER — Ambulatory Visit (HOSPITAL_COMMUNITY): Payer: Self-pay

## 2013-01-08 ENCOUNTER — Encounter: Payer: Self-pay | Admitting: Internal Medicine

## 2013-01-08 ENCOUNTER — Ambulatory Visit (INDEPENDENT_AMBULATORY_CARE_PROVIDER_SITE_OTHER): Payer: BC Managed Care – PPO | Admitting: Internal Medicine

## 2013-01-08 ENCOUNTER — Ambulatory Visit (HOSPITAL_COMMUNITY): Payer: Self-pay

## 2013-01-08 VITALS — BP 115/72 | HR 69 | Temp 97.5°F | Ht 75.0 in | Wt 277.4 lb

## 2013-01-08 DIAGNOSIS — R21 Rash and other nonspecific skin eruption: Secondary | ICD-10-CM

## 2013-01-08 DIAGNOSIS — I10 Essential (primary) hypertension: Secondary | ICD-10-CM

## 2013-01-08 DIAGNOSIS — Z79899 Other long term (current) drug therapy: Secondary | ICD-10-CM

## 2013-01-08 DIAGNOSIS — E119 Type 2 diabetes mellitus without complications: Secondary | ICD-10-CM

## 2013-01-08 DIAGNOSIS — H6093 Unspecified otitis externa, bilateral: Secondary | ICD-10-CM

## 2013-01-08 DIAGNOSIS — M25529 Pain in unspecified elbow: Secondary | ICD-10-CM

## 2013-01-08 DIAGNOSIS — I214 Non-ST elevation (NSTEMI) myocardial infarction: Secondary | ICD-10-CM

## 2013-01-08 LAB — GLUCOSE, CAPILLARY: Glucose-Capillary: 135 mg/dL — ABNORMAL HIGH (ref 70–99)

## 2013-01-08 LAB — POCT GLYCOSYLATED HEMOGLOBIN (HGB A1C): Hemoglobin A1C: 7.4

## 2013-01-08 MED ORDER — TRAMADOL HCL 50 MG PO TABS: 50.0000 mg | ORAL_TABLET | Freq: Four times a day (QID) | ORAL | Status: DC | PRN

## 2013-01-08 MED ORDER — TRIAMCINOLONE ACETONIDE 0.1 % EX CREA: TOPICAL_CREAM | CUTANEOUS | Status: DC

## 2013-01-08 NOTE — Patient Instructions (Signed)
Please get an eye exam for your diabetes. We will give you some cream to try on your scalp and your ankle. If the cream is not helping your ankle please stop using it there. If it does not work we may need to send you to a skin doctor soon. Continue to work on your exercise for your sugars as we need to keep those under control. We are also getting you some pain medicine for your elbow. Come back in 2-3 months or sooner if your wounds are not improving.  Tennis Elbow Tennis elbow can happen in any sport or job where you overuse your elbow. It is caused by doing the same motion over and over. This makes small tears in the forearm muscles. It can also cause muscle redness, soreness, and puffiness (inflammation). HOME CARE  Rest.  Use your other hand or arm that is not affected. Change your grip.  Only take medicine as told by your doctor.  Put ice on the area after activity.  Put ice in a plastic bag.  Place a towel between your skin and the bag.  Leave the ice on for 15 to 20 minutes, 3 to 4 times a day.  Wear a splint or sling as told by your doctor. This lets the area rest and heal faster. GET HELP RIGHT AWAY IF: Your pain gets worse or does not improve in 2 weeks even with treatment. MAKE SURE YOU:   Understand these instructions.  Will watch your condition.  Will get help right away if you are not doing well or get worse. Document Released: 05/05/2010 Document Revised: 02/07/2012 Document Reviewed: 05/05/2010 [facility] Patient Information 2013 [place], [place].  Diabetes and Foot Care Diabetes may cause you to have a poor blood supply (circulation) to your legs and feet. Because of this, the skin may be thinner, break easier, and heal more slowly. You also may have nerve damage in your legs and feet causing decreased feeling. You may not notice minor injuries to your feet that could lead to serious problems or infections. Taking care of your feet is one of the most important things  you can do for yourself.  HOME CARE INSTRUCTIONS  Do not go barefoot. Bare feet are easily injured.  Check your feet daily for blisters, cuts, and redness.  Wash your feet with warm water (not hot) and mild soap. Pat your feet and between your toes until completely dry.  Apply a moisturizing lotion that does not contain alcohol or petroleum jelly to the dry skin on your feet and to dry brittle toenails. Do not put it between your toes.  Trim your toenails straight across. Do not dig under them or around the cuticle.  Do not cut corns or calluses, or try to remove them with medicine.  Wear clean cotton socks or stockings every day. Make sure they are not too tight. Do not wear knee high stockings since they may decrease blood flow to your legs.  Wear leather shoes that fit properly and have enough cushioning. To break in new shoes, wear them just a few hours a day to avoid injuring your feet.  Wear shoes at all times, even in the house.  Do not cross your legs. This may decrease the blood flow to your feet.  If you find a minor scrape, cut, or break in the skin on your feet, keep it and the skin around it clean and dry. These areas may be cleansed with mild soap and water. Do  not use peroxide, alcohol, iodine or Merthiolate.  When you remove an adhesive bandage, be sure not to harm the skin around it.  If you have a wound, look at it several times a day to make sure it is healing.  Do not use heating pads or hot water bottles. Burns can occur. If you have lost feeling in your feet or legs, you may not know it is happening until it is too late.  Report any cuts, sores or bruises to your caregiver. Do not wait! SEEK MEDICAL CARE IF:   You have an injury that is not healing or you notice redness, numbness, burning, or tingling.  Your feet always feel cold.  You have pain or cramps in your legs and feet. SEEK IMMEDIATE MEDICAL CARE IF:   There is increasing redness, swelling, or  increasing pain in the wound.  There is a red line that goes up your leg.  Pus is coming from a wound.  You develop an unexplained oral temperature above 102 F (38.9 C), or as your caregiver suggests.  You notice a bad smell coming from an ulcer or wound. MAKE SURE YOU:   Understand these instructions.  Will watch your condition.  Will get help right away if you are not doing well or get worse. Document Released: 11/12/2000 Document Revised: 02/07/2012 Document Reviewed: 05/21/2009 [facility] Patient Information 2013 [place], [place].

## 2013-01-08 NOTE — Assessment & Plan Note (Signed)
Seems likely to be tennis elbow. Gave prescription for tramadol at today's visit.

## 2013-01-08 NOTE — Progress Notes (Signed)
Subjective:     Patient ID: [name], male   DOB: 19-Sep-1956, 57 y.o.   MRN: [id number]  HPI The patient is a 57 year old man who comes in today for a followup visit. He did have an NSTEMI back in September of 2013. He did have placement of drug-eluting stent. He has been on effient and an aspirin since that time. He is not having any chest pain at today's visit nor has he recently. He comes in for followup of elbow pain. In addition he is having a rash on his ankle as well as a rash on his scalp. He states that the rash on his scalp has been going on for many years and used to be worse before he used a cream on it. He does not remember the name of the cream however recognizes the name triamcinolone as the cream that he was using previously. He states that previously this rash was also on his ear but that has since resolved altogether with use of the cream. He states that the rash on his ankle has been going on for several weeks to month and is not deeper than the skin. It's never drained out anything. It had started to heal however he is experiencing slow wound healing with the effient and as well as his diabetes. He doesn't recall any original insult to the area to cause the rash in the first place. He is not having any fevers or chills at home. He is not having any shortness of breath. He will call the cardiologist's office to followup with them. He states that his last eye exam was about one year ago and that he can go back for another exam.  Review of Systems  Constitutional: Positive for activity change. Negative for fever, chills, diaphoresis, appetite change, fatigue and unexpected weight change.       He is unable to work out as much with elbow pain.  HENT: Negative.   Eyes: Negative.   Respiratory: Negative for cough, chest tightness, shortness of breath and wheezing.   Cardiovascular: Negative for chest pain, palpitations and leg swelling.  Gastrointestinal: Negative for vomiting,  abdominal pain and diarrhea.  Genitourinary: Negative.   Musculoskeletal: Positive for myalgias and arthralgias. Negative for back pain, joint swelling and gait problem.  Skin: Positive for color change, rash and wound. Negative for pallor.       Several circumscribed lesions on the scalp, without drainage or fluctuance.   Rash on the ankle, blanching, without satellite lesions.  Neurological: Negative for dizziness, tremors, seizures, syncope, facial asymmetry, speech difficulty, weakness, light-headedness, numbness and headaches.  Psychiatric/Behavioral: Negative.        Objective:   Physical Exam  Constitutional: He is oriented to person, place, and time. He appears well-developed and well-nourished. No distress.  HENT:  Head: Normocephalic and atraumatic.  Eyes: EOM are normal. Pupils are equal, round, and reactive to light.  Neck: Normal range of motion. Neck supple.  Cardiovascular: Normal rate and regular rhythm.   Pulmonary/Chest: Effort normal and breath sounds normal. No respiratory distress. He has no wheezes. He has no rales. He exhibits no tenderness.  Abdominal: Soft. Bowel sounds are normal. He exhibits no distension. There is no tenderness. There is no rebound.  Musculoskeletal: Normal range of motion. He exhibits tenderness. He exhibits no edema.  Tenderness on the superior medial aspect of the elbow with tenderness to active ROM.  Neurological: He is alert and oriented to person, place, and time. No cranial nerve  deficit.  Skin: Skin is warm and dry. Rash noted. He is not diaphoretic.  Several lesions on the scalp which appear to be scabbed over, not draining any purulent material and not fluctuant.   Skin overlying ankle on right foot which is erythematous and blanching to touch. No purulent drainage.        Assessment/Plan:   1. Please see problem oriented charting.  2. Disposition-patient was given prescription for triamcinolone cream for his scalp. He was  advised that he may need dermatology referral however he did decline that at today's visit. He was advised that his right ankle rash does worsen he needs to come back immediately or if it seems that is developing an ulcer component he should come back immediately. He was advised to work on increasing his level of exercise for his diabetes. He was offered to see the diabetic educator which he also declined. He was instructed to followup with an eye exam for diabetes. He has said that he will do that. He was given prescription of tramadol for his arm pain. It sounds as though it is tennis elbow. He does not have any fluctuance or swelling on exam of the joint space. He will come back in 3 months.

## 2013-01-08 NOTE — Assessment & Plan Note (Signed)
Hemoglobin A1c 7.4 at today's visit which is elevated from 5 months ago. Advised him to see our diabetic educator which he declined. Advised him he should be exercising 5 days per week. Talk to him about diet. No medical changes and we'll continue metformin 1000 twice a day, glipizide 10 mg daily. No hypoglycemic events.

## 2013-01-08 NOTE — Assessment & Plan Note (Signed)
BP Readings from Last 3 Encounters:  01/08/13 115/72  09/04/12 106/67  08/16/12 100/71    Lab Results  Component Value Date   NA 139 08/16/2012   K 4.1 08/16/2012   CREATININE 0.85 08/16/2012    Assessment:  Blood pressure control:    Progress toward BP goal:     Comments: at goal  Plan:  Medications:  continue current medications, imdur 30 daily, lisinopril 5 daily, sotalol 40 mg BID  Educational resources provided: brochure  Self management tools provided:    Other plans:

## 2013-01-08 NOTE — Assessment & Plan Note (Addendum)
The patient continues to take effient and aspirin daily. He will make followup visit with cardiologist. He continues to take ACE inhibitor, imdur, pravastatin, beta blocker.

## 2013-01-10 ENCOUNTER — Ambulatory Visit (HOSPITAL_COMMUNITY): Payer: Self-pay

## 2013-01-11 ENCOUNTER — Other Ambulatory Visit: Payer: Self-pay | Admitting: Internal Medicine

## 2013-01-12 ENCOUNTER — Ambulatory Visit (HOSPITAL_COMMUNITY): Payer: Self-pay

## 2013-01-15 ENCOUNTER — Ambulatory Visit (HOSPITAL_COMMUNITY): Payer: Self-pay

## 2013-01-15 ENCOUNTER — Telehealth: Payer: Self-pay | Admitting: *Deleted

## 2013-01-15 ENCOUNTER — Other Ambulatory Visit: Payer: Self-pay | Admitting: Internal Medicine

## 2013-01-15 DIAGNOSIS — K219 Gastro-esophageal reflux disease without esophagitis: Secondary | ICD-10-CM

## 2013-01-15 NOTE — Telephone Encounter (Signed)
i called and spoke w/ pt's wife, he has plenty of cream at this time and will call when he needs more for an appt

## 2013-01-17 ENCOUNTER — Ambulatory Visit (HOSPITAL_COMMUNITY): Payer: Self-pay

## 2013-01-19 ENCOUNTER — Ambulatory Visit (HOSPITAL_COMMUNITY): Payer: Self-pay

## 2013-01-23 ENCOUNTER — Ambulatory Visit (INDEPENDENT_AMBULATORY_CARE_PROVIDER_SITE_OTHER): Payer: BC Managed Care – PPO | Admitting: Internal Medicine

## 2013-01-23 ENCOUNTER — Encounter: Payer: Self-pay | Admitting: Internal Medicine

## 2013-01-23 VITALS — BP 128/78 | HR 63 | Temp 97.5°F | Resp 20 | Ht 75.0 in | Wt 278.5 lb

## 2013-01-23 DIAGNOSIS — E119 Type 2 diabetes mellitus without complications: Secondary | ICD-10-CM

## 2013-01-23 DIAGNOSIS — M25529 Pain in unspecified elbow: Secondary | ICD-10-CM

## 2013-01-23 DIAGNOSIS — M25521 Pain in right elbow: Secondary | ICD-10-CM

## 2013-01-23 DIAGNOSIS — Z23 Encounter for immunization: Secondary | ICD-10-CM

## 2013-01-23 DIAGNOSIS — I2510 Atherosclerotic heart disease of native coronary artery without angina pectoris: Secondary | ICD-10-CM

## 2013-01-23 DIAGNOSIS — L218 Other seborrheic dermatitis: Secondary | ICD-10-CM

## 2013-01-23 DIAGNOSIS — L210 Seborrhea capitis: Secondary | ICD-10-CM | POA: Insufficient documentation

## 2013-01-23 LAB — GLUCOSE, CAPILLARY: Glucose-Capillary: 142 mg/dL — ABNORMAL HIGH (ref 70–99)

## 2013-01-23 MED ORDER — SELENIUM SULFIDE 2.5 % EX LOTN: TOPICAL_LOTION | CUTANEOUS | Status: DC

## 2013-01-23 MED ORDER — DICLOFENAC SODIUM 1 % TD GEL: 2.0000 g | Freq: Four times a day (QID) | TRANSDERMAL | Status: DC

## 2013-01-23 NOTE — Assessment & Plan Note (Signed)
Started patient on selenium sulfide shampoo.

## 2013-01-23 NOTE — Assessment & Plan Note (Signed)
Will obtain basic metabolic function, liver function and lipid function fasting per Dr. Annitta Jersey requests.

## 2013-01-23 NOTE — Assessment & Plan Note (Signed)
Chronic elbow pain most likely tennis elbow. Discontinued tramadol ineffectiveness. Patient can not be on nonsteroidal since currently on aspirin and Effient. I will prescribe Diclofenac topically. I will further refer patient to sports medicine for further  management.

## 2013-01-23 NOTE — Progress Notes (Signed)
Subjective:   Patient ID: [name] male   DOB: 01-Apr-1956 57 y.o.   MRN: [id number]  HPI: [name] is a 57 y.o. male with past medical history significant as outlined below who presented to the clinic for follow up office visit.   Patient continues to have right  elbow pain:  Patient reports that she continues to have right elbow pain. He tried should Tylenol in the past as well as trauma normal without any significant improvement.   Right inner ankle wound: healing better . Recommended not to swim for now  Scalp: itchy and dry since month    Past Medical History  Diagnosis Date  . TOBACCO ABUSE 11/25/2006  . CORONARY ARTERY DISEASE 11/25/2006    Cardiac cath by Dr Sharyn Lull 11/2003 LV showed good LV systolic function, EF of 55-60%. Left main was patent. LAD has 20-30% mid stenosis. Diagonal 1 and diagonal 2 were patent. Left circumflex was patent. OM1 was less than 0.5 mm which was diffusely diseased. OM2 has 20% ostial stenosis which was patent which  was moderate size. OM3 was patent at prior PTCA and stented site. RCA has 20-30% proximal   . Hypertension   . Atrial arrhythmia   . Type II diabetes mellitus   . Cellulitis of knee, right 06/06/12  . Chronic otitis externa 06/06/12    C Multiple trials with antibiocs and antifungal. Cultures were positive for Pseudomonas. Follow up with ENT on 06/14/12    . NSTEMI (non-ST elevated myocardial infarction) 08/14/2012    S/p Successful PTCA to proximal OM-3      Current Outpatient Prescriptions  Medication Sig Dispense Refill  . acetaminophen (TYLENOL) 325 MG tablet Take 1 tablet (325 mg total) by mouth every 6 (six) hours as needed for pain.      Marland Kitchen aspirin 81 MG tablet Take 1 tablet (81 mg total) by mouth daily.  90 tablet  1  . glipiZIDE (GLUCOTROL) 10 MG tablet Take 1 tablet (10 mg total) by mouth daily.  30 tablet  2  . glucose blood (TRUETRACK TEST) test strip 1 each by Other route 3 (three) times daily. Use as instructed        . isosorbide mononitrate (IMDUR) 30 MG 24 hr tablet Take 1 tablet (30 mg total) by mouth daily.  90 tablet  0  . Lancets MISC by Does not apply route 3 (three) times daily.        Marland Kitchen lisinopril (PRINIVIL,ZESTRIL) 5 MG tablet Take 1 tablet (5 mg total) by mouth daily.  90 tablet  0  . metFORMIN (GLUCOPHAGE) 1000 MG tablet Take 1 tablet (1,000 mg total) by mouth 2 (two) times daily with a meal.  180 tablet  3  . nitroGLYCERIN (NITROSTAT) 0.4 MG SL tablet Place 1 tablet (0.4 mg total) under the tongue every 5 (five) minutes as needed. Do not take more then 3 tablets in 15 min.  30 tablet  1  . omeprazole (PRILOSEC) 20 MG capsule TAKE ONE CAPSULE BY MOUTH TWICE DAILY  30 capsule  0  . prasugrel (EFFIENT) 10 MG TABS Take 1 tablet (10 mg total) by mouth daily.  30 tablet  11  . pravastatin (PRAVACHOL) 40 MG tablet TAKE TWO TABLETS BY MOUTH EVERY DAY  180 tablet  0  . sotalol (BETAPACE) 80 MG tablet Take 0.5 tablets (40 mg total) by mouth 2 (two) times daily.  180 tablet  1  . traMADol (ULTRAM) 50 MG tablet Take 1 tablet (50  mg total) by mouth every 6 (six) hours as needed for pain.  20 tablet  0  . triamcinolone cream (KENALOG) 0.1 % APPLY TO SCALP ONCE A DAY  45 g  0   No current facility-administered medications for this visit.   No family history on file. History   Social History  . Marital Status: Married    Spouse Name: [name]    Number of Children: [name]  . Years of Education: [name]   Social History Main Topics  . Smoking status: Former Smoker -- 0.50 packs/day for 12 years    Types: Cigarettes    Quit date: 07/26/1979  . Smokeless tobacco: Never Used  . Alcohol Use: No  . Drug Use: No  . Sexually Active: None   Other Topics Concern  . None   Social History Narrative  . None   Review of Systems: Constitutional: Denies fever, chills, diaphoresis, appetite change and fatigue.  Respiratory: Denies SOB, DOE, cough, chest tightness,  and wheezing.   Cardiovascular: Denies chest pain,  palpitations and leg swelling.  Gastrointestinal: Denies nausea, vomiting, abdominal pain, diarrhea, constipation,  Neurological: Denies dizziness  Objective:  Physical Exam: Filed Vitals:   01/23/13 1613  BP: 128/78  Pulse: 63  Temp: 97.5 F (36.4 C)  TempSrc: Oral  Resp: 20  Height: 6\' 3"  (1.905 m)  Weight: 278 lb 8 oz (126.327 kg)  SpO2: 99%   Constitutional: Vital signs reviewed.  Patient is a well-developed and well-nourished male in no acute distress and cooperative with exam. Alert and oriented x3.  Head: well marked,  scaly, diffuse plaques without erythema present on head throughout.  Neck: Supple Cardiovascular: RRR, S1 normal, S2 normal, no MRG, pulses symmetric and intact bilaterally Pulmonary/Chest: CTAB, no wheezes, rales, or rhonchi Abdominal: Soft. Non-tender, non-distended, bowel sounds are normal Musculoskeletal: Left elbow was notable for mild swelling with tenderness. Normal range of motion, no erythema.  Neurological: A&O x3

## 2013-01-23 NOTE — Assessment & Plan Note (Signed)
Referred patient to ophthalmology.

## 2013-01-23 NOTE — Assessment & Plan Note (Signed)
Influenza vaccination given today. 

## 2013-01-30 ENCOUNTER — Telehealth: Payer: Self-pay | Admitting: *Deleted

## 2013-01-30 ENCOUNTER — Ambulatory Visit (INDEPENDENT_AMBULATORY_CARE_PROVIDER_SITE_OTHER): Payer: BC Managed Care – PPO | Admitting: Internal Medicine

## 2013-01-30 ENCOUNTER — Encounter: Payer: Self-pay | Admitting: Internal Medicine

## 2013-01-30 ENCOUNTER — Other Ambulatory Visit (INDEPENDENT_AMBULATORY_CARE_PROVIDER_SITE_OTHER): Payer: BC Managed Care – PPO | Admitting: Internal Medicine

## 2013-01-30 VITALS — BP 115/80 | HR 62 | Temp 97.0°F | Ht 75.0 in | Wt 277.5 lb

## 2013-01-30 DIAGNOSIS — I2510 Atherosclerotic heart disease of native coronary artery without angina pectoris: Secondary | ICD-10-CM

## 2013-01-30 DIAGNOSIS — M25521 Pain in right elbow: Secondary | ICD-10-CM

## 2013-01-30 DIAGNOSIS — M549 Dorsalgia, unspecified: Secondary | ICD-10-CM

## 2013-01-30 DIAGNOSIS — R21 Rash and other nonspecific skin eruption: Secondary | ICD-10-CM

## 2013-01-30 LAB — BASIC METABOLIC PANEL
BUN: 13 mg/dL (ref 6–23)
Chloride: 103 mEq/L (ref 96–112)
Creat: 0.75 mg/dL (ref 0.50–1.35)
Potassium: 4.3 mEq/L (ref 3.5–5.3)
Sodium: 139 mEq/L (ref 135–145)

## 2013-01-30 LAB — HEPATIC FUNCTION PANEL
AST: 10 U/L (ref 0–37)
Alkaline Phosphatase: 73 U/L (ref 39–117)
Bilirubin, Direct: <0.1 mg/dL (ref 0.0–0.3)
Total Bilirubin: 0.3 mg/dL (ref 0.3–1.2)
Total Protein: 7.3 g/dL (ref 6.0–8.3)

## 2013-01-30 LAB — LIPID PANEL
LDL Cholesterol: 61 mg/dL (ref 0–99)
Triglycerides: 76 mg/dL (ref <150)
VLDL: 15 mg/dL (ref 0–40)

## 2013-01-30 MED ORDER — DICLOFENAC SODIUM 1 % TD GEL: 2.0000 g | Freq: Four times a day (QID) | TRANSDERMAL | Status: DC

## 2013-01-30 NOTE — Progress Notes (Signed)
Subjective:     Patient ID: [name], male   DOB: Apr 23, 1956, 57 y.o.   MRN: [id number]  Flank Pain Pertinent negatives include no abdominal pain, chest pain, dysuria, fever, headaches, numbness or weakness.  Wound Check   The patient is a 57 YO man who comes in today for a lab visit and was complaining of some side pain. He was then seen and states that this side pain has been going on for about 1 month or longer and is worse when he is moving around and stressing his back. He is not having increased frequency, burning on urination, blood in urine, discharge. He has not had weight loss recently and is still able to perform all of his ADLs and work through this pain. He has tried alleve over the counter without much relief. He is also still having a rash on his ankle and scalp which have not improved since last visit. He is using intermittent creams (neosporin and triamcinolone) on it. He has no open portion or abscess in these areas. I had discussed dermatology with him in the past and he was unwilling to go however now is willing to go see a skin doctor. No fevers or chills at home and no area of erythema surrounding the rash. No falls at home or problems with his gait or balance.   Review of Systems  Constitutional: Negative for fever, chills, diaphoresis, activity change, appetite change, fatigue and unexpected weight change.  HENT: Negative.   Eyes: Negative.   Respiratory: Negative for apnea, cough, choking, chest tightness, shortness of breath, wheezing and stridor.   Cardiovascular: Negative for chest pain, palpitations and leg swelling.  Gastrointestinal: Negative for nausea, vomiting, abdominal pain and diarrhea.  Genitourinary: Positive for flank pain. Negative for dysuria, urgency, frequency, hematuria, discharge and difficulty urinating.  Musculoskeletal: Positive for myalgias and back pain. Negative for joint swelling, arthralgias and gait problem.  Skin: Positive for rash.  Negative for color change, pallor and wound.  Neurological: Negative for dizziness, tremors, seizures, syncope, facial asymmetry, speech difficulty, weakness, light-headedness, numbness and headaches.       Objective:   Physical Exam  Constitutional: He is oriented to person, place, and time. He appears well-developed and well-nourished. No distress.  HENT:  Head: Normocephalic and atraumatic.  Eyes: EOM are normal. Pupils are equal, round, and reactive to light.  Neck: Normal range of motion. Neck supple.  Cardiovascular: Normal rate and regular rhythm.   Pulmonary/Chest: Effort normal and breath sounds normal. No respiratory distress. He has no wheezes. He has no rales. He exhibits no tenderness.  Abdominal: Soft. Bowel sounds are normal. He exhibits no distension. There is no tenderness. There is no rebound.  Musculoskeletal: Normal range of motion. He exhibits tenderness. He exhibits no edema.  Tenderness upon palpation of the supraspinal muscles in the L2-L4 region. No CVA tenderness and no tenderness over the spinal bodies in the back from cervical to lumbar.  Neurological: He is alert and oriented to person, place, and time. No cranial nerve deficit.  Skin: Skin is warm and dry. Rash noted. He is not diaphoretic.  Several lesions on the scalp which appear to be scabbed over, not draining any purulent material and not fluctuant.   Skin overlying ankle on right foot which is erythematous and blanching to touch. No purulent drainage. Some scabs on the surface of the lesion which measures about 3x3 cm area.        Assessment/Plan:   1. Ankle rash -  Will refer to dermatology as treatments have failed and likely need skin biopsy to determine exact etiology. Does not appear to be psoriasis. Triamcinolone cream not helping.    2. Back pain - Patient already has appointment with sports medicine and advised him to bring this up at that visit. Given back exercises and advised to use these  daily. Also advised him to fill voltaren gel that was prescribed at last visit which he has not filled yet. Will order MRI lumbar spine to get idea of possible pathology in his back.

## 2013-01-30 NOTE — Progress Notes (Signed)
Results of Lab work faxed to Dr. Rinaldo Cloud by Charlyne Quale at 3:00pm on 01-30-13

## 2013-01-30 NOTE — Patient Instructions (Signed)
Please use the voltaren gel on your back and side for the pain. Go to see the skin doctor for the rash on your ankle and scalp. Keep your appointment with sports medicine for your elbow and back. Come back as previously scheduled to the clinic.    Back Exercises Back exercises help treat and prevent back injuries. The goal is to increase your strength in your belly (abdominal) and back muscles. These exercises can also help with flexibility. Start these exercises when told by your doctor. HOME CARE Back exercises include: Pelvic Tilt.  Lie on your back with your knees bent. Tilt your pelvis until the lower part of your back is against the floor. Hold this position 5 to 10 sec. Repeat this exercise 5 to 10 times. Knee to Chest.  Pull 1 knee up against your chest and hold for 20 to 30 seconds. Repeat this with the other knee. This may be done with the other leg straight or bent, whichever feels better. Then, pull both knees up against your chest. Sit-Ups or Curl-Ups.  Bend your knees 90 degrees. Start with tilting your pelvis, and do a partial, slow sit-up. Only lift your upper half 30 to 45 degrees off the floor. Take at least 2 to 3 seonds for each sit-up. Do not do sit-ups with your knees out straight. If partial sit-ups are difficult, simply do the above but with only tightening your belly (abdominal) muscles and holding it as told. Hip-Lift.  Lie on your back with your knees flexed 90 degrees. Push down with your feet and shoulders as you raise your hips 2 inches off the floor. Hold for 10 seconds, repeat 5 to 10 times. Back Arches.  Lie on your stomach. Prop yourself up on bent elbows. Slowly press on your hands, causing an arch in your low back. Repeat 3 to 5 times. Shoulder-Lifts.  Lie face down with arms beside your body. Keep hips and belly pressed to floor as you slowly lift your head and shoulders off the floor. Do not overdo your exercises. Be careful in the beginning. Exercises  may cause you some mild back discomfort. If the pain lasts for more than 15 minutes, stop the exercises until you see your doctor. Improvement with exercise for back problems is slow.  Document Released: 12/18/2010 Document Revised: 02/07/2012 Document Reviewed: 09/16/2011 [facility] Patient Information 2013 [place], [place].

## 2013-01-30 NOTE — Telephone Encounter (Signed)
Pt stopped by [facility] - states for lab work at 9:30AM - for past month left hip pain - sapping noise with movement and hurts bad. States right ankle is worse. Will try to make appt today. Stanton Kidney Ditzler RN 01/30/13 9:15AM

## 2013-02-09 ENCOUNTER — Emergency Department (HOSPITAL_COMMUNITY)
Admission: EM | Admit: 2013-02-09 | Discharge: 2013-02-09 | Disposition: A | Discharge status: Home / Self Care | Payer: BC Managed Care – PPO | Attending: Emergency Medicine | Admitting: Emergency Medicine

## 2013-02-09 ENCOUNTER — Encounter (HOSPITAL_COMMUNITY): Payer: Self-pay | Admitting: Emergency Medicine

## 2013-02-09 DIAGNOSIS — Z79899 Other long term (current) drug therapy: Secondary | ICD-10-CM | POA: Insufficient documentation

## 2013-02-09 DIAGNOSIS — M25559 Pain in unspecified hip: Secondary | ICD-10-CM | POA: Insufficient documentation

## 2013-02-09 DIAGNOSIS — Z7902 Long term (current) use of antithrombotics/antiplatelets: Secondary | ICD-10-CM | POA: Insufficient documentation

## 2013-02-09 DIAGNOSIS — I499 Cardiac arrhythmia, unspecified: Secondary | ICD-10-CM | POA: Insufficient documentation

## 2013-02-09 DIAGNOSIS — L298 Other pruritus: Secondary | ICD-10-CM

## 2013-02-09 DIAGNOSIS — M25473 Effusion, unspecified ankle: Secondary | ICD-10-CM | POA: Insufficient documentation

## 2013-02-09 DIAGNOSIS — E119 Type 2 diabetes mellitus without complications: Secondary | ICD-10-CM | POA: Insufficient documentation

## 2013-02-09 DIAGNOSIS — L299 Pruritus, unspecified: Secondary | ICD-10-CM | POA: Insufficient documentation

## 2013-02-09 DIAGNOSIS — Z8669 Personal history of other diseases of the nervous system and sense organs: Secondary | ICD-10-CM | POA: Insufficient documentation

## 2013-02-09 DIAGNOSIS — I10 Essential (primary) hypertension: Secondary | ICD-10-CM | POA: Insufficient documentation

## 2013-02-09 DIAGNOSIS — I2510 Atherosclerotic heart disease of native coronary artery without angina pectoris: Secondary | ICD-10-CM | POA: Insufficient documentation

## 2013-02-09 DIAGNOSIS — M25476 Effusion, unspecified foot: Secondary | ICD-10-CM | POA: Insufficient documentation

## 2013-02-09 DIAGNOSIS — I252 Old myocardial infarction: Secondary | ICD-10-CM | POA: Insufficient documentation

## 2013-02-09 DIAGNOSIS — Z7982 Long term (current) use of aspirin: Secondary | ICD-10-CM | POA: Insufficient documentation

## 2013-02-09 DIAGNOSIS — R21 Rash and other nonspecific skin eruption: Secondary | ICD-10-CM | POA: Insufficient documentation

## 2013-02-09 DIAGNOSIS — L210 Seborrhea capitis: Secondary | ICD-10-CM

## 2013-02-09 DIAGNOSIS — Z872 Personal history of diseases of the skin and subcutaneous tissue: Secondary | ICD-10-CM | POA: Insufficient documentation

## 2013-02-09 DIAGNOSIS — Z87891 Personal history of nicotine dependence: Secondary | ICD-10-CM | POA: Insufficient documentation

## 2013-02-09 MED ORDER — HYDROCODONE-ACETAMINOPHEN 5-325 MG PO TABS: 1.0000 | ORAL_TABLET | Freq: Four times a day (QID) | ORAL | Status: DC | PRN

## 2013-02-09 MED ORDER — HYDROCORTISONE 1 % EX CREA: TOPICAL_CREAM | CUTANEOUS | Status: DC

## 2013-02-09 MED ORDER — HYDROXYZINE HCL 25 MG PO TABS
25.0000 mg | ORAL_TABLET | Freq: Once | ORAL | Status: AC
  Administered 2013-02-09: 25 mg via ORAL
  Filled 2013-02-09: qty 1

## 2013-02-09 MED ORDER — HYDROXYZINE HCL 25 MG PO TABS: 25.0000 mg | ORAL_TABLET | Freq: Four times a day (QID) | ORAL | Status: DC

## 2013-02-09 NOTE — ED Provider Notes (Signed)
History    This chart was scribed for Felicie Morn NP, a non-physician practitioner working with Doug Sou, MD by Lewanda Rife, ED Scribe. This patient was seen in room TR05C/TR05C and the patient's care was started at 2300.     CSN: [id number]  Arrival date & time 02/09/13  1927   First MD Initiated Contact with Patient 02/09/13 2300      Chief Complaint  Patient presents with  . Back Pain  . Rash    (Consider location/radiation/quality/duration/timing/severity/associated sxs/prior treatment) HPI [name] is a 57 y.o. male who presents to the Emergency Department complaining of worsening constant pruritic rash on right ankle and right temporal scalp onset 1-2 months. Pt describes the rash and moderately painful and burning. Pt additionally reports mild right ankle swelling, and recurrent left hip pain. Pt denies fever, paraesthesias, weakness, and recent injury. Pt reports trying Selenium sulfide shampoo with no relief of symptoms on scalp. Pt reports using neosporin on the rash on right ankle with no relief of symptoms. Pt denies trying OTC topical steroids. Pt states he's waiting for appointment with dermatology in May.  Pt reports hx of diabetes.    Past Medical History  Diagnosis Date  . TOBACCO ABUSE 11/25/2006  . CORONARY ARTERY DISEASE 11/25/2006    Cardiac cath by Dr Sharyn Lull 11/2003 LV showed good LV systolic function, EF of 55-60%. Left main was patent. LAD has 20-30% mid stenosis. Diagonal 1 and diagonal 2 were patent. Left circumflex was patent. OM1 was less than 0.5 mm which was diffusely diseased. OM2 has 20% ostial stenosis which was patent which  was moderate size. OM3 was patent at prior PTCA and stented site. RCA has 20-30% proximal   . Hypertension   . Atrial arrhythmia   . Type II diabetes mellitus   . Cellulitis of knee, right 06/06/12  . Chronic otitis externa 06/06/12    C Multiple trials with antibiocs and antifungal. Cultures were positive for  Pseudomonas. Follow up with ENT on 06/14/12    . NSTEMI (non-ST elevated myocardial infarction) 08/14/2012    S/p Successful PTCA to proximal OM-3       Past Surgical History  Procedure Laterality Date  . Coronary angioplasty with stent placement  ~ 2005    "1"  . Cystectomy  1990's    LUA    No family history on file.  History  Substance Use Topics  . Smoking status: Former Smoker -- 0.50 packs/day for 12 years    Types: Cigarettes    Quit date: 07/26/1979  . Smokeless tobacco: Never Used  . Alcohol Use: No      Review of Systems  Constitutional: Negative.   HENT: Negative.   Respiratory: Negative.   Cardiovascular: Positive for leg swelling.       Right ankle swelling   Gastrointestinal: Negative.   Musculoskeletal: Negative.   Skin: Positive for rash.  Neurological: Negative.   Psychiatric/Behavioral: Negative.   All other systems reviewed and are negative.  A complete 10 system review of systems was obtained and all systems are negative except as noted in the HPI and PMH.    Allergies  Review of patient's allergies indicates no known allergies.  Home Medications   Current Outpatient Rx  Name  Route  Sig  Dispense  Refill  . aspirin 81 MG tablet   Oral   Take 1 tablet (81 mg total) by mouth daily.   90 tablet   1   . diclofenac sodium (VOLTAREN)  1 % GEL   Topical   Apply 2 g topically 4 (four) times daily.   100 g   0   . glipiZIDE (GLUCOTROL) 10 MG tablet   Oral   Take 1 tablet (10 mg total) by mouth daily.   30 tablet   2   . isosorbide mononitrate (IMDUR) 30 MG 24 hr tablet   Oral   Take 1 tablet (30 mg total) by mouth daily.   90 tablet   0   . lisinopril (PRINIVIL,ZESTRIL) 5 MG tablet   Oral   Take 1 tablet (5 mg total) by mouth daily.   90 tablet   0   . metFORMIN (GLUCOPHAGE) 1000 MG tablet   Oral   Take 1 tablet (1,000 mg total) by mouth 2 (two) times daily with a meal.   180 tablet   3   . nitroGLYCERIN (NITROSTAT) 0.4 MG  SL tablet   Sublingual   Place 1 tablet (0.4 mg total) under the tongue every 5 (five) minutes as needed. Do not take more then 3 tablets in 15 min.   30 tablet   1   . omeprazole (PRILOSEC) 20 MG capsule   Oral   Take 20 mg by mouth 2 (two) times daily.         . prasugrel (EFFIENT) 10 MG TABS   Oral   Take 1 tablet (10 mg total) by mouth daily.   30 tablet   11   . pravastatin (PRAVACHOL) 40 MG tablet   Oral   Take 80 mg by mouth daily.         Marland Kitchen selenium sulfide (SELSUN) 2.5 % shampoo      Massage 5-10 cc on wet scalpe and leave it on for  10 minutes and rinse it throughout. Do it every day for 7 days.  After that do it 2 times a week for 2 weeks and then do it as needed   118 mL   2   . sotalol (BETAPACE) 80 MG tablet   Oral   Take 0.5 tablets (40 mg total) by mouth 2 (two) times daily.   180 tablet   1   . glucose blood (TRUETRACK TEST) test strip   Other   1 each by Other route 3 (three) times daily. Use as instructed          . Lancets MISC   Does not apply   by Does not apply route 3 (three) times daily.             BP 141/78  Pulse 87  Temp(Src) 97.7 F (36.5 C) (Oral)  Resp 16  SpO2 96%  Physical Exam  Nursing note and vitals reviewed. Constitutional: He is oriented to person, place, and time. He appears well-developed and well-nourished. No distress.  HENT:  Head: Normocephalic and atraumatic.  Eyes: EOM are normal.  Neck: Neck supple. No tracheal deviation present.  Cardiovascular: Normal rate, intact distal pulses and normal pulses.   Pulses:      Dorsalis pedis pulses are 2+ on the right side, and 2+ on the left side.       Posterior tibial pulses are 2+ on the right side, and 2+ on the left side.  Pulmonary/Chest: Effort normal. No respiratory distress.  Musculoskeletal: Normal range of motion.  Neurological: He is alert and oriented to person, place, and time.  Skin: Skin is warm and dry. Rash noted.  Dry and excoriated right  ankle   Psychiatric:  He has a normal mood and affect. His behavior is normal.    ED Course  Procedures (including critical care time) Medications  hydrOXYzine (ATARAX/VISTARIL) tablet 25 mg (not administered)    Labs Reviewed - No data to display No results found.   1. Seborrhea capitis   2. Pruritic erythematous rash       MDM     I personally performed the services described in this documentation, which was scribed in my presence. The recorded information has been reviewed and is accurate.      Jimmye Norman, NP 02/09/13 2347  Jimmye Norman, NP 02/10/13 0005

## 2013-02-09 NOTE — ED Notes (Signed)
C/o L sided back/hip pain x 3-4 weeks.  States pain is worse when he sits down after working all day and then gets up.  Denies urinary complaints.  Also reports "rash" to R ankle and top of head x 1-2 months.  States he saw [facility]PC x 2 for rash and was told that it was getting better.  C/o burning pain, itching, and swelling to R ankle.

## 2013-02-10 NOTE — ED Provider Notes (Signed)
Medical screening examination/treatment/procedure(s) were performed by non-physician practitioner and as supervising physician I was immediately available for consultation/collaboration.  Doug Sou, MD 02/10/13 9811

## 2013-02-20 ENCOUNTER — Encounter: Payer: Self-pay | Admitting: Family Medicine

## 2013-02-20 ENCOUNTER — Ambulatory Visit (INDEPENDENT_AMBULATORY_CARE_PROVIDER_SITE_OTHER): Payer: BC Managed Care – PPO | Admitting: Family Medicine

## 2013-02-20 VITALS — BP 102/70 | HR 77 | Ht 75.0 in | Wt 277.0 lb

## 2013-02-20 DIAGNOSIS — M2170 Unequal limb length (acquired), unspecified site: Secondary | ICD-10-CM

## 2013-02-20 DIAGNOSIS — M533 Sacrococcygeal disorders, not elsewhere classified: Secondary | ICD-10-CM

## 2013-02-20 DIAGNOSIS — M999 Biomechanical lesion, unspecified: Secondary | ICD-10-CM

## 2013-02-20 MED ORDER — KETOROLAC TROMETHAMINE 60 MG/2ML IM SOLN
60.0000 mg | Freq: Once | INTRAMUSCULAR | Status: AC
  Administered 2013-02-20: 60 mg via INTRAMUSCULAR

## 2013-02-20 MED ORDER — KETOROLAC TROMETHAMINE 30 MG/ML IJ SOLN: 30.0000 mg | Freq: Four times a day (QID) | INTRAMUSCULAR | Status: DC

## 2013-02-20 MED ORDER — METHYLPREDNISOLONE ACETATE 40 MG/ML IJ SUSP
40.0000 mg | Freq: Once | INTRAMUSCULAR | Status: AC
  Administered 2013-02-20: 40 mg via INTRAMUSCULAR

## 2013-02-20 NOTE — Patient Instructions (Addendum)
Very nice continue. I do think you have any sacroiliac dysfunction. This is what giving you your low back pain. He will also have a leg length discrepancy. I am giving you a heel lift to wear in your right shoe. I am also giving you to injections today. Daily exercises I am giving you daily. I want you to ice that area 20 minutes at the end of a long day and do those exercises. Him back again in 3-4 weeks and we'll consider what else needs to be done.

## 2013-02-21 DIAGNOSIS — M533 Sacrococcygeal disorders, not elsewhere classified: Secondary | ICD-10-CM | POA: Insufficient documentation

## 2013-02-21 DIAGNOSIS — M999 Biomechanical lesion, unspecified: Secondary | ICD-10-CM | POA: Insufficient documentation

## 2013-02-21 DIAGNOSIS — M2170 Unequal limb length (acquired), unspecified site: Secondary | ICD-10-CM | POA: Insufficient documentation

## 2013-02-21 NOTE — Assessment & Plan Note (Signed)
Decision today to treat with OMT was based on Physical Exam  After verbal consent patient was treated with HVLA techniques in sacral areas  Patient tolerated the procedure well with improvement in symptoms  Patient given exercises, stretches and lifestyle modifications  See medications in patient instructions if given  Patient will follow up in 3-6 weeks

## 2013-02-21 NOTE — Progress Notes (Signed)
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 Patient has had this problem for quite some time but now is seeming to affect his daily activities. Patient states that it is on the left side and does sometimes radiates down the left leg. Patient states that he states on the posterior buttocks region. Patient denies any weakness or numbness in the lower cavity. Patient states sometimes it is very difficult to get comfortable and it hurts with movement. Patient has tried some ice and over-the-counter ibuprofen with minimal benefit. Patient does not remember any true injury to the area.  Past Medical History  Diagnosis Date  . TOBACCO ABUSE 11/25/2006  . CORONARY ARTERY DISEASE 11/25/2006    Cardiac cath by Dr Sharyn Lull 11/2003 LV showed good LV systolic function, EF of 55-60%. Left main was patent. LAD has 20-30% mid stenosis. Diagonal 1 and diagonal 2 were patent. Left circumflex was patent. OM1 was less than 0.5 mm which was diffusely diseased. OM2 has 20% ostial stenosis which was patent which  was moderate size. OM3 was patent at prior PTCA and stented site. RCA has 20-30% proximal   . Hypertension   . Atrial arrhythmia   . Type II diabetes mellitus   . Cellulitis of knee, right 06/06/12  . Chronic otitis externa 06/06/12    C Multiple trials with antibiocs and antifungal. Cultures were positive for Pseudomonas. Follow up with ENT on 06/14/12    . NSTEMI (non-ST elevated myocardial infarction) 08/14/2012    S/p Successful PTCA to proximal OM-3      Past Surgical History  Procedure Laterality Date  . Coronary angioplasty with stent placement  ~ 2005    "1"  . Cystectomy  1990's    LUA   No family history on file.  History  Substance Use Topics  . Smoking status: Former Smoker -- 0.50 packs/day for 12 years    Types: Cigarettes    Quit date:  07/26/1979  . Smokeless tobacco: Never Used  . Alcohol Use: No    Physical exam Blood pressure 102/70, pulse 77, height 6\' 3"  (1.905 m), weight 277 lb (125.646 kg). General: No apparent distress alert and oriented x3 mood and affect normal Respiratory: Patient's speak in full sentences and does not appear short of breath Skin: Warm dry intact with no signs of infection or rash Neuro: Cranial nerves II through XII are intact, neurovascularly intact in all extremities with 2+ DTRs and 2+ pulses. Back exam: On inspection no gross deformity. Patient is very tender to palpation over the left SI joint. Patient has a negative straight leg test but a positive Faber's test. He should strength of the lower extremities is 5 out of 5 with 2+ DTRs. He is neurovascularly intact distally. Patient has no spinous process tenderness. Osteopathic manipulation findings do show the patient has a left on left sacrum. Patient also has a leg length discrepancy with his right leg being approximately 1 cm shorter than left. Patient does ambulate with an antalgic gait.

## 2013-02-21 NOTE — Assessment & Plan Note (Signed)
Patient did appear to be in significant amount of pain. After verbal and written consent patient did have osteopathic manipulation done with a technique of HVLA with moderate improvement lumbosacral region. Patient was given a shot of Toradol as well as Kenalog 40 mg IM for an exacerbation. Patient was given a home exercise program of different stretches and exercises. Would like patient to try these modalities and return again in 3 weeks to see if he is doing any better. He continues to have trouble we'll consider further imaging and potentially continuing osteopathic manipulation.

## 2013-02-21 NOTE — Assessment & Plan Note (Signed)
Patient had leg length discrepancy and did have a 2-1/2 inch wide 5/16 inch lift added to his right shoe. This didn't level outpatient pelvic area. Hopefully this will decrease the amount of strain on his left SI joint.

## 2013-02-22 ENCOUNTER — Ambulatory Visit (HOSPITAL_COMMUNITY)
Admission: RE | Admit: 2013-02-22 | Discharge: 2013-02-22 | Disposition: A | Discharge status: Home / Self Care | Payer: BC Managed Care – PPO | Source: Ambulatory Visit | Attending: Internal Medicine | Admitting: Internal Medicine

## 2013-02-22 DIAGNOSIS — IMO0002 Reserved for concepts with insufficient information to code with codable children: Secondary | ICD-10-CM | POA: Insufficient documentation

## 2013-02-22 DIAGNOSIS — M129 Arthropathy, unspecified: Secondary | ICD-10-CM | POA: Insufficient documentation

## 2013-02-22 DIAGNOSIS — M79609 Pain in unspecified limb: Secondary | ICD-10-CM | POA: Insufficient documentation

## 2013-02-22 DIAGNOSIS — R079 Chest pain, unspecified: Secondary | ICD-10-CM | POA: Insufficient documentation

## 2013-02-22 DIAGNOSIS — M549 Dorsalgia, unspecified: Secondary | ICD-10-CM

## 2013-02-26 ENCOUNTER — Encounter: Payer: Self-pay | Admitting: Internal Medicine

## 2013-02-26 ENCOUNTER — Ambulatory Visit (INDEPENDENT_AMBULATORY_CARE_PROVIDER_SITE_OTHER): Payer: BC Managed Care – PPO | Admitting: Internal Medicine

## 2013-02-26 VITALS — BP 102/61 | HR 69 | Temp 97.7°F | Ht 75.0 in | Wt 274.5 lb

## 2013-02-26 DIAGNOSIS — L03115 Cellulitis of right lower limb: Secondary | ICD-10-CM

## 2013-02-26 DIAGNOSIS — L03119 Cellulitis of unspecified part of limb: Secondary | ICD-10-CM

## 2013-02-26 DIAGNOSIS — E119 Type 2 diabetes mellitus without complications: Secondary | ICD-10-CM

## 2013-02-26 DIAGNOSIS — M533 Sacrococcygeal disorders, not elsewhere classified: Secondary | ICD-10-CM

## 2013-02-26 DIAGNOSIS — L210 Seborrhea capitis: Secondary | ICD-10-CM

## 2013-02-26 DIAGNOSIS — I214 Non-ST elevation (NSTEMI) myocardial infarction: Secondary | ICD-10-CM

## 2013-02-26 DIAGNOSIS — I10 Essential (primary) hypertension: Secondary | ICD-10-CM

## 2013-02-26 LAB — GLUCOSE, CAPILLARY: Glucose-Capillary: 73 mg/dL (ref 70–99)

## 2013-02-26 MED ORDER — LISINOPRIL 5 MG PO TABS: 5.0000 mg | ORAL_TABLET | Freq: Every day | ORAL | Status: DC

## 2013-02-26 MED ORDER — SULFAMETHOXAZOLE-TMP DS 800-160 MG PO TABS: 1.0000 | ORAL_TABLET | Freq: Two times a day (BID) | ORAL | Status: DC

## 2013-02-26 MED ORDER — HYDROCODONE-ACETAMINOPHEN 5-325 MG PO TABS: 1.0000 | ORAL_TABLET | Freq: Three times a day (TID) | ORAL | Status: DC | PRN

## 2013-02-26 MED ORDER — HYDROXYZINE HCL 25 MG PO TABS: 25.0000 mg | ORAL_TABLET | Freq: Two times a day (BID) | ORAL | Status: DC | PRN

## 2013-02-26 NOTE — Assessment & Plan Note (Signed)
+  1 pitting edema, erythema, tender to palpation, warm to touch, burning sensation, and visible oozing of pus material between skin breakdown.  Localized to ankle region of RLE.  ~1 month duration, worsening.    -bactrim ds bid x5 days -monitor for worsening signs of infection including fever, chills, nausea, vomiting, swelling and pain -if no improvement, may be role for doppler imaging given unilateral presentation -elevate leg

## 2013-02-26 NOTE — Assessment & Plan Note (Signed)
Follows with Dr. Sharyn Lull.    -on asa, imdur, lisinopril, nitrostat prn, pravachol, effient, and sotalol

## 2013-02-26 NOTE — Patient Instructions (Signed)
General Instructions: Please go see Dr. Katrinka Blazing as soon as possible  Please take your antibiotics, bactrim twice a day for 5 days total for your leg infection, if you notice it worsening, more pain and swelling, call the clinic right away [phone] or go to ED  Keep your leg elevated above heart level while sitting  If you notice fever, chills, worsening pain, swelling and other signs of infection, call clinic right away [phone] or go to ED  Please follow up with your dermatologist tomorrow  You can try taking a benadryl tablet otc at night to see if that helps with itching  Treatment Goals:  Goals (1 Years of Data) as of 02/26/13         As of Today 02/20/13 02/09/13 02/09/13 01/30/13     Blood Pressure    . Blood Pressure < 130/80  102/61 102/70 123/83 141/78 115/80     Result Component    . HEMOGLOBIN A1C < 7.0          . LDL CALC < 100      61      Progress Toward Treatment Goals:  Treatment Goal 02/26/2013  Hemoglobin A1C at goal  Blood pressure at goal    Self Care Goals & Plans:  Self Care Goal 02/26/2013  Manage my medications take my medicines as prescribed; bring my medications to every visit  Monitor my health keep track of my blood glucose  Eat healthy foods -  Be physically active -  Other -    Home Blood Glucose Monitoring 02/26/2013  Check my blood sugar once a day  When to check my blood sugar before breakfast     Care Management & Community Referrals:  Referral 02/26/2013  Referrals made for care management support none needed    Cellulitis Cellulitis is an infection of the skin and the tissue under the skin. The infected area is usually red and tender. This happens most often in the arms and lower legs. HOME CARE   Take your antibiotic medicine as told. Finish the medicine even if you start to feel better.  Keep the infected arm or leg raised (elevated).  Put a warm cloth on the area up to 4 times per day.  Only take medicines as told by your  doctor.  Keep all doctor visits as told. GET HELP RIGHT AWAY IF:   You have a fever.  You feel very sleepy.  You throw up (vomit) or have watery poop (diarrhea).  You feel sick and have muscle aches and pains.  You see red streaks on the skin coming from the infected area.  Your red area gets bigger or turns a dark color.  Your bone or joint under the infected area is painful after the skin heals.  Your infection comes back in the same area or different area.  You have a puffy (swollen) bump in the infected area.  You have new symptoms. MAKE SURE YOU:   Understand these instructions.  Will watch your condition.  Will get help right away if you are not doing well or get worse. Document Released: 05/03/2008 Document Revised: 05/16/2012 Document Reviewed: 01/31/2012 [facility] Patient Information 2013 [place], [place].  Sulfamethoxazole; Trimethoprim, SMX-TMP tablets What is this medicine? SULFAMETHOXAZOLE; TRIMETHOPRIM or SMX-TMP (suhl fuh meth OK suh zohl; trye METH oh prim) is a combination of a sulfonamide antibiotic and a second antibiotic, trimethoprim. It is used to treat or prevent certain kinds of bacterial infections. It will not work for  colds, flu, or other viral infections. This medicine may be used for other purposes; ask your health care provider or pharmacist if you have questions. What should I tell my health care provider before I take this medicine? They need to know if you have any of these conditions: -anemia -asthma -being treated with anticonvulsants -if you frequently drink alcohol containing drinks -kidney disease -liver disease -low level of folic acid or [id number] dehydrogenase -poor nutrition or malabsorption -porphyria -severe allergies -thyroid disorder -an unusual or allergic reaction to sulfamethoxazole, trimethoprim, sulfa drugs, other medicines, foods, dyes, or preservatives -pregnant or trying to get  pregnant -breast-feeding How should I use this medicine? Take this medicine by mouth with a full glass of water. Follow the directions on the prescription label. Take your medicine at regular intervals. Do not take it more often than directed. Do not skip doses or stop your medicine early. Talk to your pediatrician regarding the use of this medicine in children. Special care may be needed. This medicine has been used in children as young as 46 months of age. Overdosage: If you think you have taken too much of this medicine contact a poison control center or emergency room at once. NOTE: This medicine is only for you. Do not share this medicine with others. What if I miss a dose? If you miss a dose, take it as soon as you can. If it is almost time for your next dose, take only that dose. Do not take double or extra doses. What may interact with this medicine? Do not take this medicine with any of the following medications: -aminobenzoate potassium -dofetilide -metronidazole This medicine may also interact with the following medications: -ACE inhibitors like benazepril, enalapril, lisinopril, and ramipril -cyclosporine -digoxin -diuretics -indomethacin -medicines for diabetes -methenamine -methotrexate -phenytoin -potassium supplements -pyrimethamine -sulfinpyrazone -tricyclic antidepressants -warfarin This list may not describe all possible interactions. Give your health care provider a list of all the medicines, herbs, non-prescription drugs, or dietary supplements you use. Also tell them if you smoke, drink alcohol, or use illegal drugs. Some items may interact with your medicine. What should I watch for while using this medicine? Tell your doctor or health care professional if your symptoms do not improve. Drink several glasses of water a day to reduce the risk of kidney problems. Do not treat diarrhea with over the counter products. Contact your doctor if you have diarrhea that  lasts more than 2 days or if it is severe and watery. This medicine can make you more sensitive to the sun. Keep out of the sun. If you cannot avoid being in the sun, wear protective clothing and use a sunscreen. Do not use sun lamps or tanning beds/booths. What side effects may I notice from receiving this medicine? Side effects that you should report to your doctor or health care professional as soon as possible: -allergic reactions like skin rash or hives, swelling of the face, lips, or tongue -breathing problems -fever or chills, sore throat -irregular heartbeat, chest pain -joint or muscle pain -pain or difficulty passing urine -red pinpoint spots on skin -redness, blistering, peeling or loosening of the skin, including inside the mouth -unusual bleeding or bruising -unusually weak or tired -yellowing of the eyes or skin Side effects that usually do not require medical attention (report to your doctor or health care professional if they continue or are bothersome): -diarrhea -dizziness -headache -loss of appetite -nausea, vomiting -nervousness This list may not describe all possible side effects. Call your doctor  for medical advice about side effects. You may report side effects to FDA at 1-800-FDA-1088. Where should I keep my medicine? Keep out of the reach of children. Store at room temperature between 20 to 25 degrees C (68 to 77 degrees F). Protect from light. Throw away any unused medicine after the expiration date. NOTE: This sheet is a summary. It may not cover all possible information. If you have questions about this medicine, talk to your doctor, pharmacist, or health care provider.  2013, [employer]. (07/17/2008 11:32:51 AM)

## 2013-02-26 NOTE — Assessment & Plan Note (Signed)
Plans to see dermatologist tomorrow

## 2013-02-26 NOTE — Assessment & Plan Note (Addendum)
Followed by Dr. Katrinka Blazing.  Recently received injections last week, slight improvement but now worsening, and worse on lower left side.    -follow up with Dr. Michaelle Copas office asap -refilled limited supply of Vicodin 5-325mg  #30 tablets, q8h prb

## 2013-02-26 NOTE — Assessment & Plan Note (Signed)
BP Readings from Last 3 Encounters:  02/26/13 102/61  02/20/13 102/70  02/09/13 123/83   Lab Results  Component Value Date   NA 139 01/30/2013   K 4.3 01/30/2013   CREATININE 0.75 01/30/2013   Assessment:  Blood pressure control: controlled  Progress toward BP goal:  at goal  Plan:  Medications:  continue current medications lisinopril, imdur, and sotalol  Educational resources provided: brochure  Self management tools provided: home blood pressure logbook

## 2013-02-26 NOTE — Assessment & Plan Note (Signed)
Lab Results  Component Value Date   HGBA1C 7.4 01/08/2013   HGBA1C 7.1* 08/14/2012   HGBA1C 7.8 06/12/2012    Assessment:  Diabetes control: good control (HgbA1C at goal)  Progress toward A1C goal:  at goal  Plan:  Medications:  continue current medications  Home glucose monitoring:   Frequency: once a day   Timing: before breakfast  Instruction/counseling given: reminded to get eye exam, reminded to bring blood glucose meter & log to each visit, reminded to bring medications to each visit, discussed foot care and discussed diet  Educational resources provided: brochure  Self management tools provided:

## 2013-02-26 NOTE — Progress Notes (Signed)
Subjective:   Patient ID: [name] male   DOB: 1956/11/10 57 y.o.   MRN: [id number]  HPI: [name] is a 57 y.o. very pleasant african [place] male with PMH of NSTEMI s/p PTCA on effient, CAD, controlled DM2 and HTN presenting to clinic today for routine visit of worsening right sided lower back pain.  He recently had a lumbar MRI on 3/27 after seeing Dr. Katrinka Blazing from sports medicine where he also received injections. MRI confirmed :1. Compression of the S1 nerve root sleeves in the lateral recesses at L5-S1, left worse than right due to hypertrophy of the facet joints and ligamenta flava and a small broad-based bulge of the disc. 2. Fairly severe left foraminal stenosis. 3. Severe left and moderately severe right facet arthritis with  marked edema in the bones and soft tissues around the joints, much more prominent on the left than the right.  Initially he noticed improvement in pain, however, now his pain is so sever and uncomfortable that he has a hard time lying down and walking.    The pain is localized to right lower back and radiating down right buttock, worse when standing, limited walking and lifting of right leg at this time.    He also reports itching and dryness of his skin and white/gray patches that he notices on his legs, arms, hands, head, and in ears.  He plans to see a dermatologist tomorrow.    Of note, his wife is also present in the room and she points out his right lower leg swelling near his foot that is red, tender to palpation, warm, worsening, and oozing yellow pus on the dressing they have applied to the area.  It has been there for approximately 3 weeks and worsened recently.    He denies any fever, chest pain, N/V/D, abdominal pain, shortness of breath, or any urinary complaints at this time.    Past Medical History  Diagnosis Date  . TOBACCO ABUSE 11/25/2006  . CORONARY ARTERY DISEASE 11/25/2006    Cardiac cath by Dr Sharyn Lull 11/2003 LV showed good LV  systolic function, EF of 55-60%. Left main was patent. LAD has 20-30% mid stenosis. Diagonal 1 and diagonal 2 were patent. Left circumflex was patent. OM1 was less than 0.5 mm which was diffusely diseased. OM2 has 20% ostial stenosis which was patent which  was moderate size. OM3 was patent at prior PTCA and stented site. RCA has 20-30% proximal   . Hypertension   . Atrial arrhythmia   . Type II diabetes mellitus   . Cellulitis of knee, right 06/06/12  . Chronic otitis externa 06/06/12    C Multiple trials with antibiocs and antifungal. Cultures were positive for Pseudomonas. Follow up with ENT on 06/14/12    . NSTEMI (non-ST elevated myocardial infarction) 08/14/2012    S/p Successful PTCA to proximal OM-3      Current Outpatient Prescriptions  Medication Sig Dispense Refill  . aspirin 81 MG tablet Take 1 tablet (81 mg total) by mouth daily.  90 tablet  1  . diclofenac sodium (VOLTAREN) 1 % GEL Apply 2 g topically 4 (four) times daily.  100 g  0  . glipiZIDE (GLUCOTROL) 10 MG tablet Take 1 tablet (10 mg total) by mouth daily.  30 tablet  2  . glucose blood (TRUETRACK TEST) test strip 1 each by Other route 3 (three) times daily. Use as instructed       . hydrocortisone cream 1 % Apply to affected  area 2 times daily  15 g  0  . isosorbide mononitrate (IMDUR) 30 MG 24 hr tablet Take 1 tablet (30 mg total) by mouth daily.  90 tablet  0  . Lancets MISC by Does not apply route 3 (three) times daily.        Marland Kitchen lisinopril (PRINIVIL,ZESTRIL) 5 MG tablet Take 1 tablet (5 mg total) by mouth daily.  90 tablet  3  . metFORMIN (GLUCOPHAGE) 1000 MG tablet Take 1 tablet (1,000 mg total) by mouth 2 (two) times daily with a meal.  180 tablet  3  . nitroGLYCERIN (NITROSTAT) 0.4 MG SL tablet Place 1 tablet (0.4 mg total) under the tongue every 5 (five) minutes as needed. Do not take more then 3 tablets in 15 min.  30 tablet  1  . omeprazole (PRILOSEC) 20 MG capsule Take 20 mg by mouth 2 (two) times daily.      .  prasugrel (EFFIENT) 10 MG TABS Take 1 tablet (10 mg total) by mouth daily.  30 tablet  11  . pravastatin (PRAVACHOL) 40 MG tablet Take 80 mg by mouth daily.      Marland Kitchen selenium sulfide (SELSUN) 2.5 % shampoo Massage 5-10 cc on wet scalpe and leave it on for  10 minutes and rinse it throughout. Do it every day for 7 days.  After that do it 2 times a week for 2 weeks and then do it as needed  118 mL  2  . sotalol (BETAPACE) 80 MG tablet Take 0.5 tablets (40 mg total) by mouth 2 (two) times daily.  180 tablet  1  . HYDROcodone-acetaminophen (NORCO/VICODIN) 5-325 MG per tablet Take 1 tablet by mouth every 8 (eight) hours as needed for pain.  30 tablet  0  . sulfamethoxazole-trimethoprim (BACTRIM DS) 800-160 MG per tablet Take 1 tablet by mouth 2 (two) times daily.  10 tablet  0   No current facility-administered medications for this visit.   History reviewed. No pertinent family history. History   Social History  . Marital Status: Married    Spouse Name: N/A    Number of Children: N/A  . Years of Education: N/A   Social History Main Topics  . Smoking status: Former Smoker -- 0.50 packs/day for 12 years    Types: Cigarettes    Quit date: 07/26/1979  . Smokeless tobacco: Never Used  . Alcohol Use: No  . Drug Use: No  . Sexually Active: Yes   Other Topics Concern  . None   Social History Narrative  . None   Review of Systems: Constitutional: Denies fever, chills, diaphoresis, appetite change and fatigue.  HEENT: Denies photophobia, eye pain, redness, hearing loss, ear pain, congestion, sore throat, rhinorrhea, sneezing, mouth sores, trouble swallowing, neck pain, neck stiffness and tinnitus.   Respiratory: Denies SOB, DOE, cough, chest tightness,  and wheezing.   Cardiovascular: RLE swelling.  Denies chest pain, palpitations.   Gastrointestinal: Denies nausea, vomiting, abdominal pain, diarrhea, constipation, blood in stool and abdominal distention.  Genitourinary: Denies dysuria,  urgency, frequency, hematuria, flank pain and difficulty urinating.  Musculoskeletal: right lower extremity near foot swelling, redness, pain.  Right sided lower back pain.  Limited ROM due to pain.  Skin: Dry, itching, gray/white scaly patches on legs, arms, head, and ears.  Denies pallor and wound.  Neurological: Denies dizziness, seizures, syncope, weakness, light-headedness, numbness and headaches.  Hematological: Denies adenopathy. Easy bruising, personal or family bleeding history  Psychiatric/Behavioral: Denies suicidal ideation, mood changes, confusion, nervousness,  sleep disturbance and agitation  Objective:  Physical Exam: Filed Vitals:   02/26/13 1442  BP: 102/61  Pulse: 69  Temp: 97.7 F (36.5 C)  TempSrc: Oral  Height: 6\' 3"  (1.905 m)  Weight: 274 lb 8 oz (124.512 kg)  SpO2: 98%   Constitutional: Vital signs reviewed.  Patient is a well-developed and well-nourished male in acute distress secondary to left lower back pain and cooperative with exam. Alert and oriented x3.  Head: Normocephalic and atraumatic Ear: TM normal bilaterally, white/gray dry scaly patches in b/l ears Mouth: no erythema or exudates, MMM Eyes: PERRL, EOMI, conjunctivae normal, No scleral icterus.  Neck: Supple, Trachea midline normal ROM Cardiovascular: RRR, S1 normal, S2 normal, no MRG, pulses symmetric and intact bilaterally Pulmonary/Chest: CTAB, no wheezes, rales, or rhonchi Abdominal: Soft. Non-tender, non-distended, bowel sounds are normal, no masses, organomegaly, or guarding present.  GU: no CVA tenderness Musculoskeletal: No joint deformities.  Right lower extremity near ankle erythema, +1 pitting edema, warm to touch, skin breakdown, visible yellow drainage on dressing, tender to palpation.  Limited range of motion of LLE due to complaints of back pain.  Minimal straight leg raise of LLE.  +2dp b/l Neurological: A&O x3, Strength is normal and symmetric bilaterally, cranial nerve II-XII are  grossly intact, no focal motor deficit, sensory intact to light touch bilaterally.  Skin: Warm, dry, swelling, tenderness, warmth, and skin breakdown of RLE near base of leg and ankle, diffuse itching, white/gray scaling patches on hands, legs, arms, head, and ears.   Psychiatric: Normal mood and affect. speech and behavior is normal. Judgment and thought content normal. Cognition and memory are normal.   Assessment & Plan:  Discussed with Dr. Eben Burow -R lower back pain: MRI 3/27 confirmed Compression of the S1 nerve root sleeves in the lateral recesses at L5-S1, left worse than right due to hypertrophy of the facet joints and ligamenta flava and a small broad-based bulge of disc.  Fairly severe left foraminal stenosis. Severe left and moderately severe right facet arthritis with marked edema in the bones and soft tissues around the joints, much more prominent on the left than the right. -bactrim ds bid x5 days -follow up Dr. Katrinka Blazing sports medicine asap -given 10 day supply limited of vicodin -f/u dermatologist

## 2013-03-05 ENCOUNTER — Other Ambulatory Visit: Payer: Self-pay | Admitting: Internal Medicine

## 2013-03-05 DIAGNOSIS — I252 Old myocardial infarction: Secondary | ICD-10-CM

## 2013-03-06 ENCOUNTER — Other Ambulatory Visit: Payer: Self-pay | Admitting: Internal Medicine

## 2013-03-09 ENCOUNTER — Other Ambulatory Visit: Payer: Self-pay | Admitting: Internal Medicine

## 2013-03-14 ENCOUNTER — Encounter: Payer: Self-pay | Admitting: Family Medicine

## 2013-03-14 ENCOUNTER — Ambulatory Visit (INDEPENDENT_AMBULATORY_CARE_PROVIDER_SITE_OTHER): Payer: BC Managed Care – PPO | Admitting: Family Medicine

## 2013-03-14 VITALS — BP 122/70 | Ht 75.0 in | Wt 277.0 lb

## 2013-03-14 DIAGNOSIS — G8929 Other chronic pain: Secondary | ICD-10-CM

## 2013-03-14 DIAGNOSIS — M545 Low back pain, unspecified: Secondary | ICD-10-CM

## 2013-03-14 DIAGNOSIS — M79605 Pain in left leg: Secondary | ICD-10-CM

## 2013-03-14 DIAGNOSIS — IMO0002 Reserved for concepts with insufficient information to code with codable children: Secondary | ICD-10-CM

## 2013-03-14 MED ORDER — KETOROLAC TROMETHAMINE 60 MG/2ML IM SOLN
60.0000 mg | Freq: Once | INTRAMUSCULAR | Status: AC
  Administered 2013-03-14: 60 mg via INTRAMUSCULAR

## 2013-03-14 MED ORDER — GABAPENTIN 300 MG PO CAPS: ORAL_CAPSULE | ORAL | Status: DC

## 2013-03-14 MED ORDER — PREDNISONE 50 MG PO TABS: ORAL_TABLET | ORAL | Status: DC

## 2013-03-14 NOTE — Progress Notes (Signed)
Chief complaint: Low back pain  History of present illness: This patient is here for reevaluation of his left-sided low back pain. Patient states that did seem to improve somewhat for a little bit of time after the manipulation but unfortunately the pain seemed to increase again. Patient has been doing his exercises on a daily basis and has been going to the gym which he has felt to be somewhat helpful but unfortunately he continues to have the posterior buttocks pain that does radiate down the back of his left leg from time to time. He continues to deny any weakness or numbness in the lower extremity. He was given Vicodin by his other physician which did seem to help the pain. Patient was sent him for an MRI.  MRI was reviewed by me today. IMPRESSION:  1. Compression of the S1 nerve root sleeves in the lateral  recesses at L5-S1, left worse than right due to hypertrophy of the  facet joints and ligamenta flava and a small broad-based bulge of  the disc.  2. Fairly severe left foraminal stenosis.  3. Severe left and moderately severe right facet arthritis with  marked edema in the bones and soft tissues around the joints, much  more prominent on the left than the right.  Patient has not taken any pain medications on a regular basis and past medical history significant for hypertension as well as diabetic.  Physical exam Blood pressure 122/70, height 6\' 3"  (1.905 m), weight 277 lb (125.646 kg). General: No apparent distress alert and oriented x3 mood and affect normal  Respiratory: Patient's speak in full sentences and does not appear short of breath  Skin: Warm dry intact with no signs of infection or rash  Neuro: Cranial nerves II through XII are intact, neurovascularly intact in all extremities with 2+ DTRs and 2+ pulses.  Back exam: On inspection no gross deformity. Patient is very tender to palpation over the left SI joint. Patient has a negative straight leg test but a positive Faber's  test. Strength of lower extremities is 5 out of 5 with 2+ DTRs. He is neurovascularly intact distally. Patient has no spinous process tenderness.

## 2013-03-14 NOTE — Patient Instructions (Addendum)
Prednisone for five days. Nuerontin- take 1 pill daily for 3 days then 2 times daily for 3 days then 3 times daily therafter.  PT will call you for an appointment.  Start exercises again on Saturday and get in the pool.  I will see you again in 2 weeks.

## 2013-03-14 NOTE — Assessment & Plan Note (Signed)
Patient does have chronic low back pain is likely secondary to muscle imbalances, SI joint dysfunction, as well as potentially S1 nerve root impingement with the findings seen on MRI. Patient is an injection of Toradol today. In addition this patient will do a burst of prednisone, or the potential side effects and likely that this will increase his blood glucose levels but this is only for 5 days. Patient is to monitor closely. In addition I did add Neurontin for nerve irritation. Told patient to gradually increase the dose and warned of the potential side effects. Patient encouraged to continue to do the home exercises but we'll start formal physical therapy as well. We'll see patient again in 2 weeks' time. As long as he continues to improve we'll continue doing the same regimen including osteopathic manipulation at that time. Patient is having worsening pain we'll consider doing an epidural steroid injection or facet injection of the left L5-S1 regions.

## 2013-03-20 ENCOUNTER — Ambulatory Visit (INDEPENDENT_AMBULATORY_CARE_PROVIDER_SITE_OTHER): Payer: BC Managed Care – PPO | Admitting: Family Medicine

## 2013-03-20 ENCOUNTER — Encounter: Payer: Self-pay | Admitting: Family Medicine

## 2013-03-20 VITALS — BP 123/71 | Ht 75.0 in | Wt 277.0 lb

## 2013-03-20 DIAGNOSIS — M545 Low back pain, unspecified: Secondary | ICD-10-CM

## 2013-03-20 DIAGNOSIS — IMO0002 Reserved for concepts with insufficient information to code with codable children: Secondary | ICD-10-CM

## 2013-03-20 DIAGNOSIS — G8929 Other chronic pain: Secondary | ICD-10-CM

## 2013-03-20 DIAGNOSIS — M533 Sacrococcygeal disorders, not elsewhere classified: Secondary | ICD-10-CM

## 2013-03-20 MED ORDER — HYDROCODONE-ACETAMINOPHEN 5-325 MG PO TABS: 1.0000 | ORAL_TABLET | Freq: Three times a day (TID) | ORAL | Status: DC | PRN

## 2013-03-20 NOTE — Patient Instructions (Addendum)
Danielle from [facility] will call you to schedule the ESI. Her number is [phone] is you have not heard  From her by Friday. Address is [address]

## 2013-03-20 NOTE — Progress Notes (Signed)
  Subjective:    Patient ID: [name], male    DOB: [birth year], 57 y.o.   MRN: [id number]  HPI   Review of Systems     Objective:   Physical Exam  NAD Even on H test bilaterally Extender to 80 deg on lt, and 75 deg on rt HS strength good in 3 positions Normal calf definition with heel raises bilat  Running gait - neutral  Hop test- equal both legs           Assessment & Plan:

## 2013-03-20 NOTE — Progress Notes (Signed)
Chief complaint: Low back pain  HPI: The patient returns today for reevaluation of his left-sided low back pain. Patient did have an MRI done that did show compression of the S1 nerve root in the lateral recess at L5-S1. Patient continues to unfortunately have the pain.  She did have a burst of prednisone did not make any significant improvement whatsoever. Patient is taking is Neurontin 3 times a day with minimal benefit. Patient is still awaiting to start formal physical therapy.  Past medical history, social, surgical and family history all reviewed.    Exam Blood pressure 123/71, height 6\' 3"  (1.905 m), weight 277 lb (125.646 kg). General: No apparent distress alert and oriented x3 mood and affect normal.  Patient though is laying down and in considerable uncomfortable state.  Respiratory: Patient's speak in full sentences and does not appear short of breath  Skin: Warm dry intact with no signs of infection or rash  Neuro: Cranial nerves II through XII are intact, neurovascularly intact in all extremities with 2+ DTRs and 2+ pulses.  Back exam: On inspection no gross deformity. Patient is very tender to palpation over the left SI joint. Patient has a mildly + straight leg test and a positive Faber's test. Strength of lower extremities is 5 out of 5 with 2+ DTRs. He is neurovascularly intact distally. Patient has no spinous process tenderness.

## 2013-03-20 NOTE — Assessment & Plan Note (Addendum)
The patient continues to have this chronic radicular low-back pain. The patient did not respond to conservative therapy. Patient will have an selective S1 nerve root block on the left side. If patient does get relief with this and we know that this was diagnostic as well as potentially therapeutic. I would like patient to potentially have this in a series of 3 injections if necessary. Patient does well with this then we can consider starting him more aggressive formal physical therapy. If patient does have some improvement but it does not last any significant duration and we'll consider orthopedic surgery referral. Patient does not have any improvement with this and we may need to consider a SI joint injection. Patient given a prescription for narco today but knows that we will not be refilling this. Patient will return again in 3 weeks for further evaluation.

## 2013-03-21 ENCOUNTER — Other Ambulatory Visit: Payer: Self-pay | Admitting: Family Medicine

## 2013-03-21 DIAGNOSIS — M545 Low back pain: Secondary | ICD-10-CM

## 2013-03-21 DIAGNOSIS — M533 Sacrococcygeal disorders, not elsewhere classified: Secondary | ICD-10-CM

## 2013-03-27 ENCOUNTER — Ambulatory Visit (INDEPENDENT_AMBULATORY_CARE_PROVIDER_SITE_OTHER): Payer: BC Managed Care – PPO | Admitting: Family Medicine

## 2013-03-27 ENCOUNTER — Encounter: Payer: Self-pay | Admitting: Family Medicine

## 2013-03-27 VITALS — BP 116/73 | Ht 75.0 in | Wt 277.0 lb

## 2013-03-27 DIAGNOSIS — G8929 Other chronic pain: Secondary | ICD-10-CM

## 2013-03-27 DIAGNOSIS — IMO0002 Reserved for concepts with insufficient information to code with codable children: Secondary | ICD-10-CM

## 2013-03-27 MED ORDER — HYDROCODONE-ACETAMINOPHEN 5-325 MG PO TABS: 1.0000 | ORAL_TABLET | Freq: Three times a day (TID) | ORAL | Status: DC | PRN

## 2013-03-27 NOTE — Patient Instructions (Signed)
Keep going to the gym.  Keep doing the exercises.  I hope physical therapy will get to you soon I will send you to pain management as well to see if they can help.  Lets have you come back in 6-8 weeks just so I can make sure you are doing well.

## 2013-03-27 NOTE — Progress Notes (Signed)
Chief complaint: Low back pain  HPI: The patient returns today for reevaluation of his left-sided low back pain.  Patient was going to have epidural steroid injections but unfortunately is unable to have this secondary to his drug-eluting stent and meeting antiplatelet therapy for one year. Patient states that he continues to try to go to the gym at least twice a week which has been minimally helpful. Patient is taking Neurontin on a regular basis. Still waiting to start formal physical therapy. Patient did have an MRI done that did show compression of the S1 nerve root in the lateral recess at L5-S1. Patient continues to unfortunately have the pain.  She did have a burst of prednisone did not make any significant improvement whatsoever. Patient is taking is Neurontin 3 times a day with minimal benefit.   Past medical history, social, surgical and family history all reviewed.    Exam Blood pressure 116/73, height 6\' 3"  (1.905 m), weight 277 lb (125.646 kg). General: No apparent distress alert and oriented x3 mood and affect normal.  Patient though is laying down and in considerable uncomfortable state.  Respiratory: Patient's speak in full sentences and does not appear short of breath  Skin: Warm dry intact with no signs of infection or rash  Neuro: Cranial nerves II through XII are intact, neurovascularly intact in all extremities with 2+ DTRs and 2+ pulses.  Back exam: On inspection no gross deformity. Patient is very tender to palpation over the left SI joint. Patient has a mildly + straight leg test and a positive Faber's test. Strength of lower extremities is 5 out of 5 with 2+ DTRs. He is neurovascularly intact distally. Patient has no spinous process tenderness.

## 2013-03-27 NOTE — Assessment & Plan Note (Signed)
Patient will continue pain medications that have been given to him. Patient was given another prescription for Vicodin today but told that will not refill this medication again. Patient was referred to a pain management specialist if necessary. Encouraged patient to continue home exercises as well as going to the gym and organ basis. We'll reappoint him in formal physical therapy referral. Patient will come back again in 6 weeks for further evaluation.

## 2013-04-06 ENCOUNTER — Encounter: Payer: Self-pay | Admitting: Physical Medicine & Rehabilitation

## 2013-04-09 ENCOUNTER — Ambulatory Visit (INDEPENDENT_AMBULATORY_CARE_PROVIDER_SITE_OTHER): Payer: BC Managed Care – PPO | Admitting: Internal Medicine

## 2013-04-09 VITALS — BP 100/68 | HR 75 | Temp 97.8°F | Ht 75.0 in | Wt 272.7 lb

## 2013-04-09 DIAGNOSIS — E119 Type 2 diabetes mellitus without complications: Secondary | ICD-10-CM

## 2013-04-09 DIAGNOSIS — M533 Sacrococcygeal disorders, not elsewhere classified: Secondary | ICD-10-CM

## 2013-04-09 DIAGNOSIS — I10 Essential (primary) hypertension: Secondary | ICD-10-CM

## 2013-04-09 LAB — GLUCOSE, CAPILLARY: Glucose-Capillary: 152 mg/dL — ABNORMAL HIGH (ref 70–99)

## 2013-04-09 MED ORDER — GLIPIZIDE 10 MG PO TABS: 10.0000 mg | ORAL_TABLET | Freq: Every day | ORAL | Status: DC

## 2013-04-09 MED ORDER — PRAVASTATIN SODIUM 40 MG PO TABS: 80.0000 mg | ORAL_TABLET | Freq: Every day | ORAL | Status: DC

## 2013-04-09 NOTE — Patient Instructions (Addendum)
Please check your medication list at home and call us if you need any refills> [phone]

## 2013-04-09 NOTE — Progress Notes (Signed)
Subjective:   Patient ID: [name] male   DOB: [birth year] 57 y.o.   MRN: [id number]  HPI: [name] is a 57 y.o. male with past history significant as outlined below who presented to the clinic for regular followup. Patient noted that he continues to have significant back pain. Patient has been about by sports medicine for further patient to pain clinic. He has an appointment in 3 weeks.   Past Medical History  Diagnosis Date  . TOBACCO ABUSE 11/25/2006  . CORONARY ARTERY DISEASE 11/25/2006    Cardiac cath by Dr Sharyn Lull 11/2003 LV showed good LV systolic function, EF of 55-60%. Left main was patent. LAD has 20-30% mid stenosis. Diagonal 1 and diagonal 2 were patent. Left circumflex was patent. OM1 was less than 0.5 mm which was diffusely diseased. OM2 has 20% ostial stenosis which was patent which  was moderate size. OM3 was patent at prior PTCA and stented site. RCA has 20-30% proximal   . Hypertension   . Atrial arrhythmia   . Type II diabetes mellitus   . Cellulitis of knee, right 06/06/12  . Chronic otitis externa 06/06/12    C Multiple trials with antibiocs and antifungal. Cultures were positive for Pseudomonas. Follow up with ENT on 06/14/12    . NSTEMI (non-ST elevated myocardial infarction) 08/14/2012    S/p Successful PTCA to proximal OM-3      Current Outpatient Prescriptions  Medication Sig Dispense Refill  . aspirin 81 MG tablet Take 1 tablet (81 mg total) by mouth daily.  90 tablet  1  . diclofenac sodium (VOLTAREN) 1 % GEL Apply 2 g topically 4 (four) times daily.  100 g  0  . gabapentin (NEURONTIN) 300 MG capsule Take 1 pill at night for 3 days, then 1 pill two times a day for 3 days, and then three times a day therafter.  90 capsule  2  . glipiZIDE (GLUCOTROL) 10 MG tablet Take 1 tablet (10 mg total) by mouth daily.  30 tablet  2  . glucose blood (TRUETRACK TEST) test strip 1 each by Other route 3 (three) times daily. Use as instructed       . HYDROcodone-acetaminophen  (NORCO) 5-325 MG per tablet Take 1 tablet by mouth every 8 (eight) hours as needed for pain.  40 tablet  0  . hydrocortisone cream 1 % Apply to affected area 2 times daily  15 g  0  . isosorbide mononitrate (IMDUR) 30 MG 24 hr tablet Take 1 tablet (30 mg total) by mouth daily.  90 tablet  0  . isosorbide mononitrate (IMDUR) 30 MG 24 hr tablet TAKE ONE TABLET BY MOUTH EVERY DAY  90 tablet  0  . Lancets MISC by Does not apply route 3 (three) times daily.        Marland Kitchen lisinopril (PRINIVIL,ZESTRIL) 5 MG tablet Take 1 tablet (5 mg total) by mouth daily.  90 tablet  3  . metFORMIN (GLUCOPHAGE) 1000 MG tablet Take 1 tablet (1,000 mg total) by mouth 2 (two) times daily with a meal.  180 tablet  3  . nitroGLYCERIN (NITROSTAT) 0.4 MG SL tablet Place 1 tablet (0.4 mg total) under the tongue every 5 (five) minutes as needed. Do not take more then 3 tablets in 15 min.  30 tablet  1  . omeprazole (PRILOSEC) 20 MG capsule Take 20 mg by mouth 2 (two) times daily.      Marland Kitchen omeprazole (PRILOSEC) 20 MG capsule TAKE ONE CAPSULE  BY MOUTH TWICE DAILY  30 capsule  0  . prasugrel (EFFIENT) 10 MG TABS Take 1 tablet (10 mg total) by mouth daily.  30 tablet  11  . pravastatin (PRAVACHOL) 40 MG tablet Take 80 mg by mouth daily.      . predniSONE (DELTASONE) 50 MG tablet One tablet daily for 5 days.  5 tablet  0  . selenium sulfide (SELSUN) 2.5 % shampoo Massage 5-10 cc on wet scalpe and leave it on for  10 minutes and rinse it throughout. Do it every day for 7 days.  After that do it 2 times a week for 2 weeks and then do it as needed  118 mL  2  . sotalol (BETAPACE) 80 MG tablet Take 0.5 tablets (40 mg total) by mouth 2 (two) times daily. Initially prescribed by Dr Sharyn Lull  180 tablet  0  . sulfamethoxazole-trimethoprim (BACTRIM DS) 800-160 MG per tablet Take 1 tablet by mouth 2 (two) times daily.  10 tablet  0   No current facility-administered medications for this visit.   No family history on file. History   Social History   . Marital Status: Married    Spouse Name: N/A    Number of Children: N/A  . Years of Education: N/A   Social History Main Topics  . Smoking status: Former Smoker -- 0.50 packs/day for 12 years    Types: Cigarettes    Quit date: 07/26/1979  . Smokeless tobacco: Never Used  . Alcohol Use: No  . Drug Use: No  . Sexually Active: Yes   Other Topics Concern  . Not on file   Social History Narrative  . No narrative on file   Review of Systems: Constitutional: Denies fever, chills, diaphoresis, appetite change and fatigue.    Respiratory: Denies SOB, DOE, cough, chest tightness,  and wheezing.   Cardiovascular: Denies chest pain, palpitations and leg swelling.  Gastrointestinal: Denies nausea, vomiting, abdominal pain, diarrhea, constipation, blood in stool and abdominal distention.  Genitourinary: Denies dysuria, urgency, frequency, hematuria, flank pain and difficulty urinating.   Skin: Denies pallor, rash and wound.  Neurological: Denies dizziness,weakness, light-headedness, numbness and headaches.    Objective:  Physical Exam: Filed Vitals:   04/09/13 1547  BP: 100/68  Pulse: 75  Temp: 97.8 F (36.6 C)  TempSrc: Oral  Height: 6\' 3"  (1.905 m)  Weight: 272 lb 11.2 oz (123.696 kg)  SpO2: 96%   Constitutional: Vital signs reviewed.  Patient is a well-developed and well-nourished male  in no acute distress and cooperative with exam. Alert and oriented x3.  Eyes: PERRL, EOMI, conjunctivae normal, No scleral icterus.  Neck: Supple, Cardiovascular: RRR, S1 normal, S2 normal, no MRG, pulses symmetric and intact bilaterally Pulmonary/Chest: CTAB, no wheezes, rales, or rhonchi Abdominal: Soft. Non-tender, non-distended, bowel sounds are normal,  Musculoskeletal: Back: Significant tender to palpation on the lower lumbar spine.  No joint deformities, erythema, Hematology: no cervical adenopathy.  Neurological: A&O x3, Strength is normal and symmetric bilaterally, cranial nerve  II-XII are grossly intact, no focal motor deficit, sensory intact to light touch bilaterally.  Skin: Warm, dry and intact. No rash, cyanosis, or clubbing.

## 2013-04-10 NOTE — Assessment & Plan Note (Signed)
Diabetes mellitus type 2 uncontrolled due to medical noncompliance. Patient had been not taking glipizide for few weeks at least. I refilled the medication and discussed the importance about taking the medication on a daily basis. I asked the patient to go home and review his medication list and call me for any other refills needed. I will continue glipizide 10 mg daily and metformin thousand grams twice a day. I discussed at length about diet and exercise changes.

## 2013-04-10 NOTE — Assessment & Plan Note (Signed)
Blood pressure is well-controlled during this office visit. We'll continue Sotalol 80 mg, lisinopril 5 mg daily, Imdur 30 mg daily. BP Readings from Last 3 Encounters:  04/09/13 100/68  03/27/13 116/73  03/20/13 123/71

## 2013-04-11 NOTE — Progress Notes (Signed)
Case discussed with Dr. Illath immediately after the resident saw the patient. We reviewed the resident's history and exam and pertinent patient test results. I agree with the assessment, diagnosis and plan of care documented in the resident's note. 

## 2013-04-17 ENCOUNTER — Ambulatory Visit: Payer: BC Managed Care – PPO | Admitting: Family Medicine

## 2013-04-18 ENCOUNTER — Encounter: Payer: Self-pay | Admitting: Physical Medicine & Rehabilitation

## 2013-04-18 ENCOUNTER — Encounter
Payer: BC Managed Care – PPO | Attending: Physical Medicine & Rehabilitation | Admitting: Physical Medicine & Rehabilitation

## 2013-04-18 VITALS — BP 112/68 | HR 62 | Resp 14 | Ht 72.0 in | Wt 272.0 lb

## 2013-04-18 DIAGNOSIS — M545 Low back pain: Secondary | ICD-10-CM

## 2013-04-18 DIAGNOSIS — Z5181 Encounter for therapeutic drug level monitoring: Secondary | ICD-10-CM

## 2013-04-18 DIAGNOSIS — M47816 Spondylosis without myelopathy or radiculopathy, lumbar region: Secondary | ICD-10-CM | POA: Insufficient documentation

## 2013-04-18 DIAGNOSIS — E119 Type 2 diabetes mellitus without complications: Secondary | ICD-10-CM | POA: Insufficient documentation

## 2013-04-18 DIAGNOSIS — M25559 Pain in unspecified hip: Secondary | ICD-10-CM | POA: Insufficient documentation

## 2013-04-18 DIAGNOSIS — Z7902 Long term (current) use of antithrombotics/antiplatelets: Secondary | ICD-10-CM | POA: Insufficient documentation

## 2013-04-18 DIAGNOSIS — G8929 Other chronic pain: Secondary | ICD-10-CM

## 2013-04-18 DIAGNOSIS — M51379 Other intervertebral disc degeneration, lumbosacral region without mention of lumbar back pain or lower extremity pain: Secondary | ICD-10-CM | POA: Insufficient documentation

## 2013-04-18 DIAGNOSIS — M5137 Other intervertebral disc degeneration, lumbosacral region: Secondary | ICD-10-CM | POA: Insufficient documentation

## 2013-04-18 DIAGNOSIS — M47817 Spondylosis without myelopathy or radiculopathy, lumbosacral region: Secondary | ICD-10-CM

## 2013-04-18 DIAGNOSIS — Z79899 Other long term (current) drug therapy: Secondary | ICD-10-CM

## 2013-04-18 DIAGNOSIS — I2510 Atherosclerotic heart disease of native coronary artery without angina pectoris: Secondary | ICD-10-CM | POA: Insufficient documentation

## 2013-04-18 MED ORDER — TRAMADOL HCL 50 MG PO TABS: 50.0000 mg | ORAL_TABLET | Freq: Four times a day (QID) | ORAL | Status: DC | PRN

## 2013-04-18 MED ORDER — PREDNISONE 20 MG PO TABS: 20.0000 mg | ORAL_TABLET | Freq: Every day | ORAL | Status: DC

## 2013-04-18 NOTE — Progress Notes (Signed)
Subjective:    Patient ID: [name], male    DOB: 1956/11/03, 57 y.o.   MRN: [id number] CC: low back and hip pain  HPI  This is a pleasant 57 yo african [place] male referred for an initial evaluation who reports slow onset of low back and left hip pain about 3 months ago. He doesn't recall any anteceding incident, accident, etc.  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   He saw [facility] family practice who treated him with predisone, hydrocodone, neurontin amongst other meds. He may have had a shot of toradol or two. He couldn't have an ESI due to a drug eluding stent in one of his coronary arteries for which needs to continue on Plavix until September  Currently, he's taking voltaren gel and gabapentin for pain---neither of which help a great deal.  He had an MRI of his lumbar spine in March which revealed: L5-S1: Severe left facet arthritis with marked edema in and around  the facet joint and extending into the left pedicles and vertebral  bodies at L5 and S1. Moderate right facet arthritis with edema  extending into the right pedicles. Disc space narrowing with a  small broad-based disc bulge. Bilateral lateral recess stenosis,  left greater than right. The left S1 nerve root sleeve is  compressed in the lateral recess to a greater degree than the right  S1 nerve root. There is also left foraminal stenosis due to the  hypertrophied facet joint  He finds it very difficult to stand after he's been sitting for a time. He is uncomfortable if he lies supine for a long period of time as well. He does find that if he gets up and walks around for awhile his back will loosen up a bit.      Pain Inventory Average Pain 5 Pain Right Now 5 My pain is aching  In the last 24 hours, has pain interfered with the following? General activity 7 Relation with others 7 Enjoyment of life  7 What TIME of day is your pain at its worst? morning and day Sleep (in general) Poor  Pain is worse with: sitting Pain improves with: medication Relief from Meds: out of medication  Mobility walk without assistance ability to climb steps?  yes do you drive?  yes  Function not employed: date last employed [place]  Neuro/Psych No problems in this area  Prior Studies Any changes since last visit?  no  Physicians involved in your care Any changes since last visit?  no   History reviewed. No pertinent family history. History   Social History  . Marital Status: Married    Spouse Name: N/A    Number of Children: N/A  . Years of Education: N/A   Social History Main Topics  . Smoking status: Former Smoker -- 0.50 packs/day for 12 years    Types: Cigarettes    Quit date: 07/26/1979  . Smokeless tobacco: Never Used  . Alcohol Use: No  . Drug Use: No  . Sexually Active: Yes   Other Topics Concern  . None   Social History Narrative  . None   Past Surgical History  Procedure Laterality Date  . Coronary angioplasty with stent placement  ~ 2005    "1"  . Cystectomy  1990's    LUA   Past Medical History  Diagnosis Date  .  TOBACCO ABUSE 11/25/2006  . CORONARY ARTERY DISEASE 11/25/2006    Cardiac cath by Dr Sharyn Lull 11/2003 LV showed good LV systolic function, EF of 55-60%. Left main was patent. LAD has 20-30% mid stenosis. Diagonal 1 and diagonal 2 were patent. Left circumflex was patent. OM1 was less than 0.5 mm which was diffusely diseased. OM2 has 20% ostial stenosis which was patent which  was moderate size. OM3 was patent at prior PTCA and stented site. RCA has 20-30% proximal   . Hypertension   . Atrial arrhythmia   . Type II diabetes mellitus   . Cellulitis of knee, right 06/06/12  . Chronic otitis externa 06/06/12    C Multiple trials with antibiocs and antifungal. Cultures were positive for Pseudomonas. Follow up with ENT on 06/14/12    . NSTEMI (non-ST elevated  myocardial infarction) 08/14/2012    S/p Successful PTCA to proximal OM-3      BP 112/68  Pulse 62  Resp 14  Ht 6' (1.829 m)  Wt 272 lb (123.378 kg)  BMI 36.88 kg/m2  SpO2 96%     Review of Systems  Musculoskeletal: Positive for back pain.  All other systems reviewed and are negative.       Objective:   Physical Exam  General: Alert and oriented x 3, No apparent distress. Large frame/obese HEENT: Head is normocephalic, atraumatic, PERRLA, EOMI, sclera anicteric, oral mucosa pink and moist, dentition intact, ext ear canals clear,  Neck: Supple without JVD or lymphadenopathy Heart: Reg rate and rhythm. No murmurs rubs or gallops Chest: CTA bilaterally without wheezes, rales, or rhonchi; no distress Abdomen: Soft, non-tender, non-distended, bowel sounds positive. Extremities: No clubbing, cyanosis, or edema. Pulses are 2+ Skin: Clean and intact without signs of breakdown Neuro: Pt is cognitively appropriate with normal insight, memory, and awareness. Cranial nerves 2-12 are intact. Sensory exam is normal. Reflexes are 1+ in all 4's. Fine motor coordination is intact. No tremors. Motor function is grossly 5/5 except for pain inhibition in the left leg. Musculoskeletal: Pt stands with a bit of a flexed posture. He bends to almost 90 degrees with only modest pain but tended to rotate to the right slightly. Extension caused more discomfort. Facet maneuver was positive left more than right. Lateral bending to the left caused discomfort as well. SLR was equivocal to negative. FABER test was equivocal. Pelvis was fairly symmetrical. Gait is noticeable for some antalgia on the left.  Psych: Pt's affect is appropriate. Pt is cooperative. He is very pleasant.           Assessment & Plan:  1. Low back pain with severe, left greater than right facet disease at L5-S1. Also with degenerative disk disease and some encroachment upon the S1 nerve root sleeve. His symptoms are most consistent  with severe left L5-S1 facet arthritis. He does NOT present with symptoms consistent with a left S1 radiculopathy, although that problem may not be far behind. 2. DM2--?level of control (he does not check sugars) 3. CAD with medicated stent, on plavix   Plan: 1. Despite the diabetes, and given our limited options, I rx'ed a prednisone taper which will take place over 2 weeks time. I asked the patient to begin checking his CBG's QID at least for now so we can gage how high the prednisone drives his sugars and make adjustments with his meds temporarily. We can look at MBB this September when he can come off the plavix. 2. Agree with physical therapy, although the focus should  be on his facets, addressing lengthening of the spine, stretching, modalities, ROM, and HEP. 3. Tramadol was rx'ed to help with pain levels acutely. 4. He is not an NSAID candidate given above 5. I will see him back in about a month. 45 minutes of face to face patient care time were spent during this visit. All questions were encouraged and answered.

## 2013-04-18 NOTE — Patient Instructions (Signed)
PLEASE CHECK YOUR SUGARS 4 X DAILY WITH MEALS AND AT BEDTIME.  PLEASE ASK THERAPY TO CONTACT ME ONCE THEY START TREATING YOU. THEY ALSO MAY REVIEW MY NOTE

## 2013-04-20 ENCOUNTER — Telehealth: Payer: Self-pay

## 2013-04-20 NOTE — Telephone Encounter (Signed)
Patient called saying tramadol is not helping.  Left message for patient that we have sent a message to the provider and will call as soon as we get a response.  Please advise.

## 2013-04-24 NOTE — Telephone Encounter (Signed)
Can rx oxycodone pending a UDS. Did we collect one?  I don't see it?    He was also given prednisone at last visit to help with pain.

## 2013-04-25 ENCOUNTER — Ambulatory Visit: Payer: BC Managed Care – PPO | Attending: Family Medicine | Admitting: Physical Therapy

## 2013-04-25 DIAGNOSIS — M545 Low back pain, unspecified: Secondary | ICD-10-CM | POA: Insufficient documentation

## 2013-04-25 DIAGNOSIS — IMO0001 Reserved for inherently not codable concepts without codable children: Secondary | ICD-10-CM | POA: Insufficient documentation

## 2013-04-25 NOTE — Telephone Encounter (Signed)
Patient is taking prednisone.  Urine drug screen is not back yet.  Patient will wait for results.  Will call once in.

## 2013-04-27 NOTE — Telephone Encounter (Signed)
UDS back and is consistent.  What dose of oxycodone and how much.

## 2013-04-30 MED ORDER — OXYCODONE HCL 5 MG PO TABS: 5.0000 mg | ORAL_TABLET | Freq: Three times a day (TID) | ORAL | Status: DC | PRN

## 2013-04-30 NOTE — Telephone Encounter (Signed)
Notified rx available for pick up 

## 2013-04-30 NOTE — Telephone Encounter (Signed)
Done

## 2013-05-02 ENCOUNTER — Ambulatory Visit: Payer: BC Managed Care – PPO | Attending: Family Medicine | Admitting: Rehabilitation

## 2013-05-02 DIAGNOSIS — IMO0001 Reserved for inherently not codable concepts without codable children: Secondary | ICD-10-CM | POA: Insufficient documentation

## 2013-05-02 DIAGNOSIS — M545 Low back pain, unspecified: Secondary | ICD-10-CM | POA: Insufficient documentation

## 2013-05-03 ENCOUNTER — Encounter: Payer: Self-pay | Admitting: Internal Medicine

## 2013-05-03 ENCOUNTER — Ambulatory Visit: Payer: BC Managed Care – PPO

## 2013-05-08 ENCOUNTER — Ambulatory Visit: Payer: BC Managed Care – PPO | Admitting: Family Medicine

## 2013-05-10 ENCOUNTER — Ambulatory Visit (INDEPENDENT_AMBULATORY_CARE_PROVIDER_SITE_OTHER): Payer: BC Managed Care – PPO | Admitting: Internal Medicine

## 2013-05-10 ENCOUNTER — Telehealth: Payer: Self-pay

## 2013-05-10 VITALS — BP 107/66 | HR 70 | Temp 97.2°F | Ht 75.0 in | Wt 267.6 lb

## 2013-05-10 DIAGNOSIS — F17200 Nicotine dependence, unspecified, uncomplicated: Secondary | ICD-10-CM

## 2013-05-10 DIAGNOSIS — Z79891 Long term (current) use of opiate analgesic: Secondary | ICD-10-CM

## 2013-05-10 DIAGNOSIS — R195 Other fecal abnormalities: Secondary | ICD-10-CM

## 2013-05-10 DIAGNOSIS — R1310 Dysphagia, unspecified: Secondary | ICD-10-CM

## 2013-05-10 DIAGNOSIS — Z79899 Other long term (current) drug therapy: Secondary | ICD-10-CM

## 2013-05-10 DIAGNOSIS — K2970 Gastritis, unspecified, without bleeding: Secondary | ICD-10-CM

## 2013-05-10 DIAGNOSIS — K219 Gastro-esophageal reflux disease without esophagitis: Secondary | ICD-10-CM

## 2013-05-10 LAB — CBC
HCT: 33.9 % — ABNORMAL LOW (ref 39.0–52.0)
MCHC: 33 g/dL (ref 30.0–36.0)
MCV: 80.7 fL (ref 78.0–100.0)
RDW: 15.8 % — ABNORMAL HIGH (ref 11.5–15.5)

## 2013-05-10 MED ORDER — SUCRALFATE 1 G PO TABS: 1.0000 g | ORAL_TABLET | Freq: Four times a day (QID) | ORAL | Status: DC

## 2013-05-10 MED ORDER — ESOMEPRAZOLE MAGNESIUM 20 MG PO CPDR: 20.0000 mg | DELAYED_RELEASE_CAPSULE | Freq: Every day | ORAL | Status: DC

## 2013-05-10 NOTE — Telephone Encounter (Signed)
Patient called complaining of increased pain and nothing is helping.  Please call.

## 2013-05-10 NOTE — Progress Notes (Addendum)
Subjective:     Patient ID: [name], male   DOB: November 07, 1956, 57 y.o.   MRN: [id number]  HPI 57 year old african [place] gentleman, current smoker, with long standing history of GERD, on chronic opiates, NSAIDs, for various joint pains and back pain, aspirin and plavix for coronary stenting in 2005, steroid course recently done, comes in with new onset dysphagia since last week. He is having trouble swallowing solids, however, he feels good when he swallows ice cream and milk shakes. He has a sensation of having a mass on the right side of his pharynx. He has been belching and burping, and early satiety symptoms since last month. He stopped his omeprazole a month ago due to financial reasons. He describes only 1-2 episodes of nausea with sweating, but no regular symptoms. He admits that he is having dark stools since the past 2 months, however does not think that they are "black tarry" or contain blood. He denies chest pain, shortness of breath, palpitations, fatigue, dizziness, syncope or vomiting. He denies fever, chills, and cough. He admits to mild constipation.   He  has a past medical history of TOBACCO ABUSE (11/25/2006); CORONARY ARTERY DISEASE (11/25/2006); Hypertension; Atrial arrhythmia; Type II diabetes mellitus; Cellulitis of knee, right (06/06/12); Chronic otitis externa (06/06/12); and NSTEMI (non-ST elevated myocardial infarction) (08/14/2012).   Review of Systems As per HPI    Objective:   Physical Exam General: Well rested, african [place] male, in no acute distress Pharynx: Tonsils normal with no swelling or exudates, posterior pharyngeal wall without erythema or cobblestoning, uvula midline.  No neck masses appreciated. Normal ROM of neck.  Lungs: Bilateral vesicular breath sounds Heart: S1S2 RRR, no murmur.  Abdomen: Soft, non-tender. Neuro: Alert and oriented times 3. No pedal edema.    Assessment & Plan:     GERD exacerbation, with possible gastritis or PUD: Secondary  to the multiple risk factors mentioned in HPI.   Carafate 1g 4 times a day for 4-6 weeks  Nexium 20 mg daily for 4-6 weeks  Urgent referral to GI for possible endoscopy  CBC and CMP today  Instruction to take minimum pain medicines, opiates as well as NSAIDs.  Will stay on Aspirin for now.  Information about foods related to GERD given  Patient will come in 6-8 weeks later, after probably having met a GI specialist.

## 2013-05-10 NOTE — Assessment & Plan Note (Addendum)
1 week of mostly painless difficulty swallowing, feeling as though there is slow transit "all the way down." Increased belching and hiccups. Dysphagia is greater with solids than semi-solids. He describes early satiety. Probable 1-2 weeks of darker stools. Patient discontinued omeprazole 1 month ago secondary to finances. Former smoker. History concerning for dyspepsia or PUD and warrants further work up GI. - Esomeprazole (Nexium) once daily - Sucralfate (Carafate) - Referral to GI for further evaluation. - CBC and BMP today to evaluate for anemia and renal function.

## 2013-05-10 NOTE — Patient Instructions (Addendum)
You were seen in clinic today for difficulty swallowing. We would like for you to start two new medications for your acid reflux called esomeprazole (Nexium) and sucralfate (Carafate).  We will also refer you to our colleagues in Gastroenterology (GI) medicine to further evaluate your condition.  If you develop worsening difficulty swallowing, difficulty breathing, or worsening abdominal pain, please seek medical attention immediately. If your symptoms are not improving in the next 2-4 weeks, please let us see you back in clinic.  Esomeprazole (Nexium) What side effects may I notice from receiving this medicine? Side effects that you should report to your doctor or health care professional as soon as possible: -allergic reactions like skin rash, itching or hives, swelling of the face, lips, or tongue -bone, muscle or joint pain -breathing problems -chest pain or chest tightness -dark yellow or brown urine -fast, irregular heartbeat -feeling faint or lightheaded -fever or sore throat -muscle spasms -tremors -unusual bleeding or bruising -unusually weak or tired -upset stomach -yellowing of the eyes or skin Side effects that usually do not require medical attention (Report these to your doctor or health care professional if they continue or are bothersome.): -constipation -diarrhea -dry mouth -headache -nausea -stomach pain or gas -vomiting This list may not describe all possible side effects. Call your doctor for medical advice about side effects. You may report side effects to FDA at 1-800-FDA-1088. Where should I keep my medicine? Keep out of the reach of children. Store at room temperature between 15 and 30 degrees C (59 and 86 degrees F). Protect from light and moisture. Throw away any unused medicine after the expiration date. NOTE: This sheet is a summary. It may not cover all possible information. If you have questions about this medicine, talk to your doctor, pharmacist, or  health care provider.  2013, [employer]vier/Gold Standard. (02/03/2010 10:44:28 AM)  Gastroesophageal Reflux Disease, Adult Gastroesophageal reflux disease (GERD) happens when acid from your stomach flows up into the esophagus. When acid comes in contact with the esophagus, the acid causes soreness (inflammation) in the esophagus. Over time, GERD may create small holes (ulcers) in the lining of the esophagus. CAUSES   Increased body weight. This puts pressure on the stomach, making acid rise from the stomach into the esophagus.  Smoking. This increases acid production in the stomach.  Drinking alcohol. This causes decreased pressure in the lower esophageal sphincter (valve or ring of muscle between the esophagus and stomach), allowing acid from the stomach into the esophagus.  Late evening meals and a full stomach. This increases pressure and acid production in the stomach.  A malformed lower esophageal sphincter. Sometimes, no cause is found. SYMPTOMS   Burning pain in the lower part of the mid-chest behind the breastbone and in the mid-stomach area. This may occur twice a week or more often.  Trouble swallowing.  Sore throat.  Dry cough.  Asthma-like symptoms including chest tightness, shortness of breath, or wheezing. DIAGNOSIS  Your caregiver may be able to diagnose GERD based on your symptoms. In some cases, X-rays and other tests may be done to check for complications or to check the condition of your stomach and esophagus. TREATMENT  Your caregiver may recommend over-the-counter or prescription medicines to help decrease acid production. Ask your caregiver before starting or adding any new medicines.  HOME CARE INSTRUCTIONS   Change the factors that you can control. Ask your caregiver for guidance concerning weight loss, quitting smoking, and alcohol consumption.  Avoid foods and drinks that make  your symptoms worse, such as:  Caffeine or alcoholic  drinks.  Chocolate.  Peppermint or mint flavorings.  Garlic and onions.  Spicy foods.  Citrus fruits, such as oranges, lemons, or limes.  Tomato-based foods such as sauce, chili, salsa, and pizza.  Fried and fatty foods.  Avoid lying down for the 3 hours prior to your bedtime or prior to taking a nap.  Eat small, frequent meals instead of large meals.  Wear loose-fitting clothing. Do not wear anything tight around your waist that causes pressure on your stomach.  Raise the head of your bed 6 to 8 inches with wood blocks to help you sleep. Extra pillows will not help.  Only take over-the-counter or prescription medicines for pain, discomfort, or fever as directed by your caregiver.  Do not take aspirin, ibuprofen, or other nonsteroidal anti-inflammatory drugs (NSAIDs). SEEK IMMEDIATE MEDICAL CARE IF:   You have pain in your arms, neck, jaw, teeth, or back.  Your pain increases or changes in intensity or duration.  You develop nausea, vomiting, or sweating (diaphoresis).  You develop shortness of breath, or you faint.  Your vomit is green, yellow, black, or looks like coffee grounds or blood.  Your stool is red, bloody, or black. These symptoms could be signs of other problems, such as heart disease, gastric bleeding, or esophageal bleeding. MAKE SURE YOU:   Understand these instructions.  Will watch your condition.  Will get help right away if you are not doing well or get worse. Document Released: 08/25/2005 Document Revised: 02/07/2012 Document Reviewed: 06/04/2011 [facility] Patient Information 2014 [place], [place].

## 2013-05-10 NOTE — Progress Notes (Signed)
This is a Psychologist, occupational Note.  The care of the patient was discussed with Dr. Dalphine Handing and the assessment and plan was formulated with their assistance.  Please see their note for official documentation of the patient encounter.   Subjective:   Patient ID: [name] male   DOB: May 11, 1956 57 y.o.   MRN: [id number]  HPI: [name] is a 57 y.o. male with a history of GERD and tobacco abuse presenting for difficulty swallowing. Mr. Bourcier difficulty swallowing has persistent with constant intensity for the past week. He feels that he has a swelling on the right side of his throat and that food and fluids are traveling down more slowly than normal starting in his throat and continuing to be slow all the way down. He thinks that his current symptoms feel different than his previous heart burn. There is minimal pain when swallowing which when it does occur is at most 1/10 pain. During this same period the patient has experienced an increase in belching and hiccups. After prompting, he believes that his stools may be darker than normal for the past 1-2 weeks. The patient stopped taking omeprazole for his GERD about a month ago due to finances and feeling well. He has a history of NSAID use for back pain, ASA for history of drug-eluting stent. He admits to having less of an appetite, especially for solid foods. He is now preferring semi-solids such as yogurt and smoothies but was able to eat a hamburger and baked beans yesterday. He is a former 1/2 ppd smoker who does not drink alcohol. He has been consuming a lot of tomatoes recently. He denies choking on food/drink, nausea/vomiting, difficulty breathing or shortness of breath, or fevers or chills.    Past Medical History  Diagnosis Date  . TOBACCO ABUSE 11/25/2006  . CORONARY ARTERY DISEASE 11/25/2006    Cardiac cath by Dr Sharyn Lull 11/2003 LV showed good LV systolic function, EF of 55-60%. Left main was patent. LAD has 20-30% mid stenosis.  Diagonal 1 and diagonal 2 were patent. Left circumflex was patent. OM1 was less than 0.5 mm which was diffusely diseased. OM2 has 20% ostial stenosis which was patent which  was moderate size. OM3 was patent at prior PTCA and stented site. RCA has 20-30% proximal   . Hypertension   . Atrial arrhythmia   . Type II diabetes mellitus   . Cellulitis of knee, right 06/06/12  . Chronic otitis externa 06/06/12    C Multiple trials with antibiocs and antifungal. Cultures were positive for Pseudomonas. Follow up with ENT on 06/14/12    . NSTEMI (non-ST elevated myocardial infarction) 08/14/2012    S/p Successful PTCA to proximal OM-3      Current Outpatient Prescriptions  Medication Sig Dispense Refill  . aspirin 81 MG tablet Take 1 tablet (81 mg total) by mouth daily.  90 tablet  1  . clopidogrel (PLAVIX) 75 MG tablet Take 75 mg by mouth daily.      . diclofenac sodium (VOLTAREN) 1 % GEL Apply 2 g topically 4 (four) times daily.  100 g  0  . esomeprazole (NEXIUM) 20 MG capsule Take 1 capsule (20 mg total) by mouth daily.  30 capsule  1  . gabapentin (NEURONTIN) 300 MG capsule Take 1 pill at night for 3 days, then 1 pill two times a day for 3 days, and then three times a day therafter.  90 capsule  2  . glipiZIDE (GLUCOTROL) 10 MG  tablet Take 1 tablet (10 mg total) by mouth daily.  30 tablet  2  . glucose blood (TRUETRACK TEST) test strip 1 each by Other route 3 (three) times daily. Use as instructed       . hydrocortisone cream 1 % Apply to affected area 2 times daily  15 g  0  . isosorbide mononitrate (IMDUR) 30 MG 24 hr tablet TAKE ONE TABLET BY MOUTH EVERY DAY  90 tablet  0  . Lancets MISC by Does not apply route 3 (three) times daily.        Marland Kitchen lisinopril (PRINIVIL,ZESTRIL) 5 MG tablet Take 1 tablet (5 mg total) by mouth daily.  90 tablet  3  . metFORMIN (GLUCOPHAGE) 1000 MG tablet Take 1 tablet (1,000 mg total) by mouth 2 (two) times daily with a meal.  180 tablet  3  . nitroGLYCERIN (NITROSTAT) 0.4 MG  SL tablet Place 1 tablet (0.4 mg total) under the tongue every 5 (five) minutes as needed. Do not take more then 3 tablets in 15 min.  30 tablet  1  . oxyCODONE (OXY IR/ROXICODONE) 5 MG immediate release tablet Take 1 tablet (5 mg total) by mouth every 8 (eight) hours as needed for pain.  75 tablet  0  . pravastatin (PRAVACHOL) 40 MG tablet Take 2 tablets (80 mg total) by mouth daily.  30 tablet  2  . predniSONE (DELTASONE) 20 MG tablet Take 1 tablet (20 mg total) by mouth daily. Take 1 tab 3x daily for 4 days, then 1 tab 2x daily for 4 days, then 1 tab once daily for 4 days, then 1/2 tab daily for 4 days then off  26 tablet  0  . selenium sulfide (SELSUN) 2.5 % shampoo Massage 5-10 cc on wet scalpe and leave it on for  10 minutes and rinse it throughout. Do it every day for 7 days.  After that do it 2 times a week for 2 weeks and then do it as needed  118 mL  2  . sotalol (BETAPACE) 80 MG tablet Take 0.5 tablets (40 mg total) by mouth 2 (two) times daily. Initially prescribed by Dr Sharyn Lull  180 tablet  0  . sucralfate (CARAFATE) 1 G tablet Take 1 tablet (1 g total) by mouth 4 (four) times daily.  120 tablet  1  . traMADol (ULTRAM) 50 MG tablet Take 1 tablet (50 mg total) by mouth every 6 (six) hours as needed for pain.  60 tablet  1   No current facility-administered medications for this visit.   No family history on file. History   Social History  . Marital Status: Married    Spouse Name: N/A    Number of Children: N/A  . Years of Education: N/A   Social History Main Topics  . Smoking status: Former Smoker -- 0.50 packs/day for 12 years    Types: Cigarettes    Quit date: 07/26/1979  . Smokeless tobacco: Never Used  . Alcohol Use: No  . Drug Use: No  . Sexually Active: Yes   Other Topics Concern  . None   Social History Narrative  . None   Review of Systems: A comprehensive 12 point review of systems was performed and is negative except as stated above. Objective:  Physical  Exam: Filed Vitals:   05/10/13 1323  BP: 107/66  Pulse: 70  Temp: 97.2 F (36.2 C)  TempSrc: Oral  Height: 6\' 3"  (1.905 m)  Weight: 267 lb 9.6 oz (121.383  kg)  SpO2: 100%   General appearance: alert, cooperative and no distress Head: Normocephalic, without obvious abnormality, atraumatic Throat: no teeth and bottom dentures present. moist mucus membranes of oropharynx. Tonsil are not enlarged and without exudates. Neck: no adenopathy, supple, symmetrical, trachea midline and could not appreciate the swollen area in his right neck. Lungs: clear to auscultation bilaterally and normal work of breathing. Heart: regular rate and rhythm, S1, S2 normal, no murmur, click, rub or gallop Abdomen: soft, non-tender; bowel sounds normal; no masses,  no organomegaly Extremities: extremities normal, atraumatic, no cyanosis or edema Pulses: 2+ and symmetric Skin: Skin color, texture, turgor normal. No rashes or lesions Assessment & Plan:  WINTHROP SHANNAHAN is a 57 y.o. male with a history of GERD and tobacco abuse presenting with 1 week of dysphagia.  Dysphagia, unspecified(787.20) 1 week of mostly painless difficulty swallowing, feeling as though there is slow transit "all the way down." Increased belching and hiccups. Dysphagia is greater with solids than semi-solids. He describes early satiety. Probable 1-2 weeks of darker stools. Patient discontinued omeprazole 1 month ago secondary to finances. Former smoker. History concerning for dyspepsia or PUD and warrants further work up GI. - Esomeprazole (Nexium) once daily - Sucralfate (Carafate) - Referral to GI for further evaluation. - CBC and BMP today to evaluate for anemia and renal function.    FOLLOW UP: Return if problem recurs,  worsens, or new problem develops.

## 2013-05-11 LAB — COMPLETE METABOLIC PANEL WITH GFR
AST: 9 U/L (ref 0–37)
Alkaline Phosphatase: 64 U/L (ref 39–117)
BUN: 11 mg/dL (ref 6–23)
Creat: 0.65 mg/dL (ref 0.50–1.35)
Total Bilirubin: 0.2 mg/dL — ABNORMAL LOW (ref 0.3–1.2)

## 2013-05-11 NOTE — Telephone Encounter (Signed)
Spoke with patient.  He says nothing is helping his pain.  Offered him an earlier appointment then Wednesday but he says he can make it until then.  He says he is taking all of his medication as directed.  He is also monitoring his blood sugar.  Advised him to try heat/ice therapy until then.  Patient agreed.

## 2013-05-14 ENCOUNTER — Encounter: Payer: BC Managed Care – PPO | Admitting: Internal Medicine

## 2013-05-15 ENCOUNTER — Ambulatory Visit: Payer: BC Managed Care – PPO | Admitting: Rehabilitation

## 2013-05-16 ENCOUNTER — Encounter: Payer: Self-pay | Admitting: Physical Medicine & Rehabilitation

## 2013-05-16 ENCOUNTER — Encounter
Payer: BC Managed Care – PPO | Attending: Physical Medicine & Rehabilitation | Admitting: Physical Medicine & Rehabilitation

## 2013-05-16 ENCOUNTER — Ambulatory Visit: Payer: BC Managed Care – PPO | Admitting: Rehabilitation

## 2013-05-16 VITALS — BP 134/79 | HR 67 | Resp 17 | Ht 75.0 in | Wt 260.0 lb

## 2013-05-16 DIAGNOSIS — M7062 Trochanteric bursitis, left hip: Secondary | ICD-10-CM | POA: Insufficient documentation

## 2013-05-16 DIAGNOSIS — M76899 Other specified enthesopathies of unspecified lower limb, excluding foot: Secondary | ICD-10-CM | POA: Insufficient documentation

## 2013-05-16 DIAGNOSIS — M47817 Spondylosis without myelopathy or radiculopathy, lumbosacral region: Secondary | ICD-10-CM | POA: Insufficient documentation

## 2013-05-16 DIAGNOSIS — M47816 Spondylosis without myelopathy or radiculopathy, lumbar region: Secondary | ICD-10-CM

## 2013-05-16 MED ORDER — OXYCODONE-ACETAMINOPHEN 10-325 MG PO TABS: 1.0000 | ORAL_TABLET | Freq: Three times a day (TID) | ORAL | Status: DC | PRN

## 2013-05-16 NOTE — Patient Instructions (Addendum)
HAVE PT PROVIDE YOU SOME FURTHER EXERCISES FOR YOUR TROCHANTERIC BURSITIS   Trochanteric Bursitis You have hip pain due to trochanteric bursitis. Bursitis means that the sack near the outside of the hip is filled with fluid and inflamed. This sack is made up of protective soft tissue. The pain from trochanteric bursitis can be severe and keep you from sleep. It can radiate to the buttocks or down the outside of the thigh to the knee. The pain is almost always worse when rising from the seated or lying position and with walking. Pain can improve after you take a few steps. It happens more often in people with hip joint and lumbar spine problems, such as arthritis or previous surgery. Very rarely the trochanteric bursa can become infected, and antibiotics and/or surgery may be needed. Treatment often includes an injection of local anesthetic mixed with cortisone medicine. This medicine is injected into the area where it is most tender over the hip. Repeat injections may be necessary if the response to treatment is slow. You can apply ice packs over the tender area for 30 minutes every 2 hours for the next few days. Anti-inflammatory and/or narcotic pain medicine may also be helpful. Limit your activity for the next few days if the pain continues. See your caregiver in 5-10 days if you are not greatly improved.  SEEK IMMEDIATE MEDICAL CARE IF:  You develop severe pain, fever, or increased redness.  You have pain that radiates below the knee. EXERCISES STRETCHING EXERCISES - Trochantic Bursitis  These exercises may help you when beginning to rehabilitate your injury. Your symptoms may resolve with or without further involvement from your physician, physical therapist or athletic trainer. While completing these exercises, remember:   Restoring tissue flexibility helps normal motion to return to the joints. This allows healthier, less painful movement and activity.  An effective stretch should be held for  at least 30 seconds.  A stretch should never be painful. You should only feel a gentle lengthening or release in the stretched tissue. STRETCH  Iliotibial Band  On the floor or bed, lie on your side so your injured leg is on top. Bend your knee and grab your ankle.  Slowly bring your knee back so that your thigh is in line with your trunk. Keep your heel at your buttocks and gently arch your back so your head, shoulders and hips line up.  Slowly lower your leg so that your knee approaches the floor/bed until you feel a gentle stretch on the outside of your thigh. If you do not feel a stretch and your knee will not fall farther, place the heel of your opposite foot on top of your knee and pull your thigh down farther.  Hold this stretch for __________ seconds.  Repeat __________ times. Complete this exercise __________ times per day. STRETCH Hamstrings, Supine   Lie on your back. Loop a belt or towel over the ball of your foot as shown.  Straighten your knee and slowly pull on the belt to raise your injured leg. Do not allow the knee to bend. Keep your opposite leg flat on the floor.  Raise the leg until you feel a gentle stretch behind your knee or thigh. Hold this position for __________ seconds.  Repeat __________ times. Complete this stretch __________ times per day. STRETCH - Quadriceps, Prone   Lie on your stomach on a firm surface, such as a bed or padded floor.  Bend your knee and grasp your ankle. If you  are unable to reach, your ankle or pant leg, use a belt around your foot to lengthen your reach.  Gently pull your heel toward your buttocks. Your knee should not slide out to the side. You should feel a stretch in the front of your thigh and/or knee.  Hold this position for __________ seconds.  Repeat __________ times. Complete this stretch __________ times per day. STRETCHING - Hip Flexors, Lunge Half kneel with your knee on the floor and your opposite knee bent and  directly over your ankle.  Keep good posture with your head over your shoulders. Tighten your buttocks to point your tailbone downward; this will prevent your back from arching too much.  You should feel a gentle stretch in the front of your thigh and/or hip. If you do not feel any resistance, slightly slide your opposite foot forward and then slowly lunge forward so your knee once again lines up over your ankle. Be sure your tailbone remains pointed downward.  Hold this stretch for __________ seconds.  Repeat __________ times. Complete this stretch __________ times per day. STRETCH - Adductors, Lunge  While standing, spread your legs  Lean away from your injured leg by bending your opposite knee. You may rest your hands on your thigh for balance.  You should feel a stretch in your inner thigh. Hold for __________ seconds.  Repeat __________ times. Complete this exercise __________ times per day. Document Released: 12/23/2004 Document Revised: 02/07/2012 Document Reviewed: 02/27/2009 [facility] Patient Information 2014 [place], [place].

## 2013-05-16 NOTE — Progress Notes (Signed)
Subjective:    Patient ID: [name], male    DOB: 06-Sep-1956, 57 y.o.   MRN: [id number]  HPI  [name] is back regarding his chronic pain. He feels that PT has been helpful in that it's made him stronger, more flexible. He has also has lost weight which has helped also. He has lost weight by exercising more and eating better  He uses the oxycodone for breakthrough pain but it doesn't seem to touch it.  He's only using the oxy every other day or so. He's taken two oxy on occasion and that hasn't worked either. The pain at this point seems to be more at his left hip than at his left low back.  Pain Inventory Average Pain 5 Pain Right Now 2 My pain is aching  In the last 24 hours, has pain interfered with the following? General activity 0 Relation with others 0 Enjoyment of life 5 What TIME of day is your pain at its worst? evening Sleep (in general) Poor  Pain is worse with: sitting Pain improves with: therapy/exercise Relief from Meds: 0  Mobility ability to climb steps?  yes do you drive?  yes  Function employed # of hrs/week 30  Neuro/Psych No problems in this area  Prior Studies Any changes since last visit?  no  Physicians involved in your care Any changes since last visit?  no   History reviewed. No pertinent family history. History   Social History  . Marital Status: Married    Spouse Name: N/A    Number of Children: N/A  . Years of Education: N/A   Social History Main Topics  . Smoking status: Former Smoker -- 0.50 packs/day for 12 years    Types: Cigarettes    Quit date: 07/26/1979  . Smokeless tobacco: Never Used  . Alcohol Use: No  . Drug Use: No  . Sexually Active: Yes   Other Topics Concern  . None   Social History Narrative  . None   Past Surgical History  Procedure Laterality Date  . Coronary angioplasty with stent placement  ~ 2005    "1"  . Cystectomy  1990's    LUA   Past Medical History  Diagnosis Date  . TOBACCO ABUSE  11/25/2006  . CORONARY ARTERY DISEASE 11/25/2006    Cardiac cath by Dr Sharyn Lull 11/2003 LV showed good LV systolic function, EF of 55-60%. Left main was patent. LAD has 20-30% mid stenosis. Diagonal 1 and diagonal 2 were patent. Left circumflex was patent. OM1 was less than 0.5 mm which was diffusely diseased. OM2 has 20% ostial stenosis which was patent which  was moderate size. OM3 was patent at prior PTCA and stented site. RCA has 20-30% proximal   . Hypertension   . Atrial arrhythmia   . Type II diabetes mellitus   . Cellulitis of knee, right 06/06/12  . Chronic otitis externa 06/06/12    C Multiple trials with antibiocs and antifungal. Cultures were positive for Pseudomonas. Follow up with ENT on 06/14/12    . NSTEMI (non-ST elevated myocardial infarction) 08/14/2012    S/p Successful PTCA to proximal OM-3      BP 134/79  Pulse 67  Resp 17  Ht 6\' 3"  (1.905 m)  Wt 260 lb (117.935 kg)  BMI 32.5 kg/m2  SpO2 97%      Review of Systems  All other systems reviewed and are negative.       Objective:   Physical Exam General: Alert and  oriented x 3, No apparent distress. Large frame/obese  HEENT: Head is normocephalic, atraumatic, PERRLA, EOMI, sclera anicteric, oral mucosa pink and moist, dentition intact, ext ear canals clear,  Neck: Supple without JVD or lymphadenopathy  Heart: Reg rate and rhythm. No murmurs rubs or gallops  Chest: CTA bilaterally without wheezes, rales, or rhonchi; no distress  Abdomen: Soft, non-tender, non-distended, bowel sounds positive.  Extremities: No clubbing, cyanosis, or edema. Pulses are 2+  Skin: Clean and intact without signs of breakdown  Neuro: Pt is cognitively appropriate with normal insight, memory, and awareness. Cranial nerves 2-12 are intact. Sensory exam is normal. Reflexes are 1+ in all 4's. Fine motor coordination is intact. No tremors. Motor function is grossly 5/5 except for pain inhibition in the left leg.  Musculoskeletal: Pt stands with a  bit of a flexed posture. He bends to almost 90 degrees with only modest pain but tended to rotate to the right slightly. Extension caused more discomfort. Facet maneuver was positive left more than right. Lateral bending to the left caused discomfort as well. SLR was equivocal to negative. FABER test was equivocal. Pelvis was fairly symmetrical. Gait is noticeably improved. Posture is better. He appears more comfortable as a whole. Left greater troch was painful. Psych: Pt's affect is appropriate. Pt is cooperative. He is very pleasant.   Assessment & Plan:   1. Low back pain with severe, left greater than right facet disease at L5-S1. Also with degenerative disk disease and some encroachment upon the S1 nerve root sleeve. His symptoms are most consistent with severe left L5-S1 facet arthritis. He does NOT present with symptoms consistent with a left S1 radiculopathy, although that problem may not be far behind.  2. DM2--?level of control (he does not check sugars)  3. CAD with medicated stent, on plavix  4. Greater trochanteric bursitis (left)  Plan:  1. After informed consent and preparation of the skin with betadine and isopropyl alcohol, I injected 40mg  of methylprednisolone and 4cc of 1% lidocaine around the left greater troch via lateral approach. The patient tolerated well, and no complications were encountered. Afterward the area was cleaned and dressed. Post- injection instructions were provided. I also provided him instruction.   2. Continue with physical therapy, although the focus should be on his facets, addressing lengthening of the spine, stretching, modalities, ROM, and HEP. They can also help him with the troch bursitis. 3. Will rx percocet for breakthrough pain 10/325 q8 prn 4. He is not an NSAID candidate given above  5. I will see him back in about 2 months.  45 minutes of face to face patient care time were spent during this visit. All questions were encouraged and answered.

## 2013-05-17 ENCOUNTER — Encounter: Payer: BC Managed Care – PPO | Admitting: Rehabilitation

## 2013-05-29 ENCOUNTER — Ambulatory Visit: Payer: BC Managed Care – PPO | Attending: Family Medicine | Admitting: Physical Therapy

## 2013-05-29 DIAGNOSIS — IMO0001 Reserved for inherently not codable concepts without codable children: Secondary | ICD-10-CM | POA: Insufficient documentation

## 2013-05-29 DIAGNOSIS — M545 Low back pain, unspecified: Secondary | ICD-10-CM | POA: Insufficient documentation

## 2013-05-30 ENCOUNTER — Other Ambulatory Visit: Payer: Self-pay | Admitting: Physical Medicine & Rehabilitation

## 2013-05-31 ENCOUNTER — Ambulatory Visit: Payer: BC Managed Care – PPO | Admitting: Physical Therapy

## 2013-06-05 ENCOUNTER — Ambulatory Visit: Payer: BC Managed Care – PPO | Admitting: Physical Therapy

## 2013-06-07 ENCOUNTER — Other Ambulatory Visit: Payer: Self-pay

## 2013-06-07 ENCOUNTER — Ambulatory Visit: Payer: BC Managed Care – PPO | Admitting: Physical Therapy

## 2013-06-11 ENCOUNTER — Ambulatory Visit: Payer: BC Managed Care – PPO | Admitting: Rehabilitation

## 2013-06-13 ENCOUNTER — Ambulatory Visit: Payer: BC Managed Care – PPO | Admitting: Rehabilitation

## 2013-06-15 ENCOUNTER — Other Ambulatory Visit: Payer: Self-pay | Admitting: Internal Medicine

## 2013-06-18 ENCOUNTER — Ambulatory Visit: Payer: BC Managed Care – PPO | Admitting: Rehabilitation

## 2013-06-19 ENCOUNTER — Ambulatory Visit: Payer: BC Managed Care – PPO | Admitting: Physical Therapy

## 2013-06-20 ENCOUNTER — Ambulatory Visit: Payer: BC Managed Care – PPO | Admitting: Rehabilitation

## 2013-06-25 ENCOUNTER — Other Ambulatory Visit: Payer: Self-pay | Admitting: *Deleted

## 2013-06-25 DIAGNOSIS — E119 Type 2 diabetes mellitus without complications: Secondary | ICD-10-CM

## 2013-06-26 MED ORDER — GLIPIZIDE 10 MG PO TABS: 10.0000 mg | ORAL_TABLET | Freq: Every day | ORAL | Status: DC

## 2013-06-27 ENCOUNTER — Ambulatory Visit: Payer: BC Managed Care – PPO | Admitting: Rehabilitation

## 2013-06-28 ENCOUNTER — Ambulatory Visit (INDEPENDENT_AMBULATORY_CARE_PROVIDER_SITE_OTHER): Payer: BC Managed Care – PPO | Admitting: Internal Medicine

## 2013-06-28 ENCOUNTER — Encounter: Payer: Self-pay | Admitting: Internal Medicine

## 2013-06-28 VITALS — BP 108/68 | HR 66 | Temp 97.5°F | Ht 75.0 in | Wt 256.6 lb

## 2013-06-28 DIAGNOSIS — I10 Essential (primary) hypertension: Secondary | ICD-10-CM

## 2013-06-28 DIAGNOSIS — E119 Type 2 diabetes mellitus without complications: Secondary | ICD-10-CM

## 2013-06-28 DIAGNOSIS — M47817 Spondylosis without myelopathy or radiculopathy, lumbosacral region: Secondary | ICD-10-CM

## 2013-06-28 DIAGNOSIS — M47816 Spondylosis without myelopathy or radiculopathy, lumbar region: Secondary | ICD-10-CM

## 2013-06-28 DIAGNOSIS — I252 Old myocardial infarction: Secondary | ICD-10-CM

## 2013-06-28 DIAGNOSIS — I2510 Atherosclerotic heart disease of native coronary artery without angina pectoris: Secondary | ICD-10-CM

## 2013-06-28 DIAGNOSIS — R1310 Dysphagia, unspecified: Secondary | ICD-10-CM

## 2013-06-28 DIAGNOSIS — K219 Gastro-esophageal reflux disease without esophagitis: Secondary | ICD-10-CM

## 2013-06-28 LAB — GLUCOSE, CAPILLARY: Glucose-Capillary: 151 mg/dL — ABNORMAL HIGH (ref 70–99)

## 2013-06-28 MED ORDER — PRAVASTATIN SODIUM 80 MG PO TABS: 80.0000 mg | ORAL_TABLET | Freq: Every day | ORAL | Status: DC

## 2013-06-28 MED ORDER — SOTALOL HCL 80 MG PO TABS: 40.0000 mg | ORAL_TABLET | Freq: Two times a day (BID) | ORAL | Status: DC

## 2013-06-28 MED ORDER — TRAMADOL HCL 50 MG PO TABS: 50.0000 mg | ORAL_TABLET | Freq: Three times a day (TID) | ORAL | Status: DC | PRN

## 2013-06-28 MED ORDER — ISOSORBIDE MONONITRATE ER 30 MG PO TB24: 30.0000 mg | ORAL_TABLET | Freq: Every day | ORAL | Status: DC

## 2013-06-28 NOTE — Assessment & Plan Note (Signed)
He is seen by Dr. Riley Kill at the pain clinic and was just placed on Percocet 10-325. He is also undergoing PT which does seem to help improve his pain. Despite this he feels that the pain medication is not working at all. Since he is having his narcotics prescribed by the pain clinic, I told his that they would have to be the ones to change the narcotic around, which he understood. I recommended that he call the pain clinic today to be seen ASAP as his appt is not until around the middle of August. He requested a refill of the Tramadol which I agreed to do until he can be seen in the pain clinic. I'm not sure how much the Tramadol will improve his pain, but he was told to alternate the Tramadol and the Percocet every 4 hours, with each being q8h PRN. Overall he seems very positive that his pain will improve. The pain clinic has plans for steroid injections after September, once he is cleared by Cardiology. He is currently on Plavix and ASA 2/2 his cardiac stenting about 1 year ago.

## 2013-06-28 NOTE — Patient Instructions (Addendum)
Back Pain, Adult Low back pain is very common. About 1 in 5 people have back pain.The cause of low back pain is rarely dangerous. The pain often gets better over time.About half of people with a sudden onset of back pain feel better in just 2 weeks. About 8 in 10 people feel better by 6 weeks.  CAUSES Some common causes of back pain include:  Strain of the muscles or ligaments supporting the spine.  Wear and tear (degeneration) of the spinal discs.  Arthritis.  Direct injury to the back. DIAGNOSIS Most of the time, the direct cause of low back pain is not known.However, back pain can be treated effectively even when the exact cause of the pain is unknown.Answering your caregiver's questions about your overall health and symptoms is one of the most accurate ways to make sure the cause of your pain is not dangerous. If your caregiver needs more information, he or she may order lab work or imaging tests (X-rays or MRIs).However, even if imaging tests show changes in your back, this usually does not require surgery. HOME CARE INSTRUCTIONS For many people, back pain returns.Since low back pain is rarely dangerous, it is often a condition that people can learn to manageon their own.   Remain active. It is stressful on the back to sit or stand in one place. Do not sit, drive, or stand in one place for more than 30 minutes at a time. Take short walks on level surfaces as soon as pain allows.Try to increase the length of time you walk each day.  Do not stay in bed.Resting more than 1 or 2 days can delay your recovery.  Do not avoid exercise or work.Your body is made to move.It is not dangerous to be active, even though your back may hurt.Your back will likely heal faster if you return to being active before your pain is gone.  Pay attention to your body when you bend and lift. Many people have less discomfortwhen lifting if they bend their knees, keep the load close to their bodies,and  avoid twisting. Often, the most comfortable positions are those that put less stress on your recovering back.  Find a comfortable position to sleep. Use a firm mattress and lie on your side with your knees slightly bent. If you lie on your back, put a pillow under your knees.  Only take over-the-counter or prescription medicines as directed by your caregiver. Over-the-counter medicines to reduce pain and inflammation are often the most helpful.Your caregiver may prescribe muscle relaxant drugs.These medicines help dull your pain so you can more quickly return to your normal activities and healthy exercise.  Put ice on the injured area.  Put ice in a plastic bag.  Place a towel between your skin and the bag.  Leave the ice on for 15-20 minutes, 3-4 times a day for the first 2 to 3 days. After that, ice and heat may be alternated to reduce pain and spasms.  Ask your caregiver about trying back exercises and gentle massage. This may be of some benefit.  Avoid feeling anxious or stressed.Stress increases muscle tension and can worsen back pain.It is important to recognize when you are anxious or stressed and learn ways to manage it.Exercise is a great option. SEEK MEDICAL CARE IF:  You have pain that is not relieved with rest or medicine.  You have pain that does not improve in 1 week.  You have new symptoms.  You are generally not feeling well. SEEK   IMMEDIATE MEDICAL CARE IF:   You have pain that radiates from your back into your legs.  You develop new bowel or bladder control problems.  You have unusual weakness or numbness in your arms or legs.  You develop nausea or vomiting.  You develop abdominal pain.  You feel faint. Document Released: 11/15/2005 Document Revised: 05/16/2012 Document Reviewed: 04/05/2011 [facility] Patient Information 2014 [place], [place]. Chronic Back Pain  When back pain lasts longer than 3 months, it is called chronic back pain.People with chronic  back pain often go through certain periods that are more intense (flare-ups).  CAUSES Chronic back pain can be caused by wear and tear (degeneration) on different structures in your back. These structures include:  The bones of your spine (vertebrae) and the joints surrounding your spinal cord and nerve roots (facets).  The strong, fibrous tissues that connect your vertebrae (ligaments). Degeneration of these structures may result in pressure on your nerves. This can lead to constant pain. HOME CARE INSTRUCTIONS  Avoid bending, heavy lifting, prolonged sitting, and activities which make the problem worse.  Take brief periods of rest throughout the day to reduce your pain. Lying down or standing usually is better than sitting while you are resting.  Take over-the-counter or prescription medicines only as directed by your caregiver. SEEK IMMEDIATE MEDICAL CARE IF:   You have weakness or numbness in one of your legs or feet.  You have trouble controlling your bladder or bowels.  You have nausea, vomiting, abdominal pain, shortness of breath, or fainting. Document Released: 12/23/2004 Document Revised: 02/07/2012 Document Reviewed: 10/30/2011 [facility] Patient Information 2014 [place], [place].

## 2013-06-28 NOTE — Assessment & Plan Note (Addendum)
Lab Results  Component Value Date   HGBA1C 7.0 06/28/2013   HGBA1C 7.8 04/09/2013   HGBA1C 7.4 01/08/2013     Assessment: Diabetes control: good control (HgbA1C at goal) Progress toward A1C goal:  improved Comments: He is doing very well and is watching what he is eating and has lost some weight.  Plan: Medications:  continue current medications Home glucose monitoring: Frequency: no home glucose monitoring Timing: N/A Instruction/counseling given: reminded to bring blood glucose meter & log to each visit, reminded to bring medications to each visit and discussed diet Educational resources provided: brochure;handout Self management tools provided:    F/u in 3 months

## 2013-06-28 NOTE — Progress Notes (Signed)
Case discussed with Dr. Glenn soon after the resident saw the patient.  We reviewed the resident's history and exam and pertinent patient test results.  I agree with the assessment, diagnosis, and plan of care documented in the resident's note. 

## 2013-06-28 NOTE — Assessment & Plan Note (Signed)
BP Readings from Last 3 Encounters:  06/28/13 108/68  05/16/13 134/79  05/10/13 107/66    Lab Results  Component Value Date   NA 139 05/10/2013   K 4.3 05/10/2013   CREATININE 0.65 05/10/2013    Assessment: Blood pressure control: controlled Progress toward BP goal:  at goal  Plan: Medications:  continue current medications Educational resources provided: brochure;handout;video Self management tools provided:

## 2013-06-28 NOTE — Progress Notes (Signed)
Patient ID: [name], male   DOB: 01-12-1956, 57 y.o.   MRN: [id number]  Subjective:   Patient ID: [name] male   DOB: [birth year] 57 y.o.   MRN: [id number]  HPI: [name] is a 57 y.o. M with PMH GERD, chronic pain, CAD s/p stenting last Sept, presents for a rouitine follow up and to establish care with me as his new PCP.   He was seen in June for dysphagia and was started on Nexium daily and Carafate and was referred to GI for evaluation. He was seen by Dr. Dulce Sellar with [facility], with plans for an endoscopy and colonoscopy in Oct/Nov- 1 year from his cardiac stenting.  He has a h/o chronic back (severe left L5-S1 facet arthritis) and joint pain at his hip, thought to be left greater trochanteric bursitis, for which he sees a pain specialist; his last visit was 6/18. He is undergoing PT which has helped his pain, in addition to weight loss. At his last pain clinic visit, he did receive a steroid injection at his left greater trochanter which helped somewhat. He is currently taking Percocet 10-325 q8h PRN pain, which he states is not doing anything for his pain. 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 He is requesting a change to his pain regimen.   He was prescribed Tramadol by the pain specialist in the past but is not taking it currently.    Past Medical History  Diagnosis Date  . TOBACCO ABUSE 11/25/2006  . CORONARY ARTERY DISEASE 11/25/2006    Cardiac cath by Dr Sharyn Lull 11/2003 LV showed good LV systolic function, EF of 55-60%. Left main was patent. LAD has 20-30% mid stenosis. Diagonal 1 and diagonal 2 were patent. Left circumflex was patent. OM1 was less than 0.5 mm which was diffusely diseased. OM2 has 20% ostial stenosis which was patent which  was moderate size. OM3 was patent at prior PTCA and stented site. RCA has 20-30% proximal   . Hypertension   . Atrial arrhythmia   . Type II diabetes mellitus   .  Cellulitis of knee, right 06/06/12  . Chronic otitis externa 06/06/12    C Multiple trials with antibiocs and antifungal. Cultures were positive for Pseudomonas. Follow up with ENT on 06/14/12    . NSTEMI (non-ST elevated myocardial infarction) 08/14/2012    S/p Successful PTCA to proximal OM-3      Current Outpatient Prescriptions  Medication Sig Dispense Refill  . aspirin 81 MG tablet Take 1 tablet (81 mg total) by mouth daily.  90 tablet  1  . clopidogrel (PLAVIX) 75 MG tablet Take 75 mg by mouth daily.      . diclofenac sodium (VOLTAREN) 1 % GEL Apply 2 g topically 4 (four) times daily.  100 g  0  . glipiZIDE (GLUCOTROL) 10 MG tablet Take 1 tablet (10 mg total) by mouth daily.  90 tablet  3  . isosorbide mononitrate (IMDUR) 30 MG 24 hr tablet Take 1 tablet (30 mg total) by mouth daily.  90 tablet  3  . Lancets MISC by Does not apply route 3 (three) times daily.        Marland Kitchen lisinopril (PRINIVIL,ZESTRIL) 5 MG tablet Take 1 tablet (5 mg total) by mouth daily.  90 tablet  3  . metFORMIN (GLUCOPHAGE) 1000 MG tablet Take 1 tablet (1,000 mg total) by mouth 2 (two) times daily with a meal.  180 tablet  3  . oxyCODONE-acetaminophen (PERCOCET) 10-325 MG per tablet Take 1 tablet by mouth every 8 (eight) hours as needed for pain.  60 tablet  0  . pravastatin (PRAVACHOL) 80 MG tablet Take 1 tablet (80 mg total) by mouth daily.  30 tablet  11  . selenium sulfide (SELSUN) 2.5 % shampoo Massage 5-10 cc on wet scalpe and leave it on for  10 minutes and rinse it throughout. Do it every day for 7 days.  After that do it 2 times a week for 2 weeks and then do it as needed  118 mL  2  . sotalol (BETAPACE) 80 MG tablet Take 0.5 tablets (40 mg total) by mouth 2 (two) times daily. Initially prescribed by Dr Sharyn Lull  180 tablet  3  . esomeprazole (NEXIUM) 20 MG capsule Take 1 capsule (20 mg total) by mouth daily.  30 capsule  1  . gabapentin (NEURONTIN) 300 MG capsule Take 1 pill at night for 3 days, then 1 pill two times a  day for 3 days, and then three times a day therafter.  90 capsule  2  . glucose blood (TRUETRACK TEST) test strip 1 each by Other route 3 (three) times daily. Use as instructed       . hydrocortisone cream 1 % Apply to affected area 2 times daily  15 g  0  . nitroGLYCERIN (NITROSTAT) 0.4 MG SL tablet Place 1 tablet (0.4 mg total) under the tongue every 5 (five) minutes as needed. Do not take more then 3 tablets in 15 min.  30 tablet  1  . sucralfate (CARAFATE) 1 G tablet Take 1 tablet (1 g total) by mouth 4 (four) times daily.  120 tablet  1  . traMADol (ULTRAM) 50 MG tablet Take 1 tablet (50 mg total) by mouth every 8 (eight) hours as needed for pain.  60 tablet  0   No current facility-administered medications for this visit.   No family history on file. History   Social History  . Marital Status: Married    Spouse Name: N/A    Number of Children: N/A  . Years of Education: N/A   Social History Main Topics  . Smoking status: Former Smoker -- 0.50 packs/day for 12 years    Types: Cigarettes    Quit date: 07/26/1979  . Smokeless tobacco: Never Used  . Alcohol Use: No  . Drug Use: No  . Sexually Active: Yes   Other Topics Concern  . None   Social History Narrative  . None   Review of Systems: Constitutional: Denies fever, chills, diaphoresis, appetite change and fatigue.  HEENT: Denies eye pain, hearing loss, ear pain, congestion, sore throat, rhinorrhea, sneezing, mouth sores, trouble swallowing, neck pain, neck stiffness and tinnitus.   Respiratory: Denies SOB, DOE, cough, chest tightness,  and wheezing.   Cardiovascular: Denies chest pain, palpitations. +chronic leg swelling.  Gastrointestinal: Denies nausea, vomiting, abdominal pain, diarrhea, constipation, blood in stool and abdominal distention.  Genitourinary: Denies dysuria, urgency, frequency, hematuria, flank pain and difficulty urinating.  Endocrine: Denies: hot or cold intolerance, sweats, changes in hair or nails,  polyuria, polydipsia. Musculoskeletal: +lower back pain towards the lateral sides of the spine at the hip that radiates into his left hip but not into his thigh or leg.  Skin: Denies pallor, rash and wound.  Neurological: Denies dizziness, seizures, syncope, weakness, light-headedness, numbness and headaches. Only endorses pain in lower back/hips Psychiatric/Behavioral: Denies suicidal ideation, mood changes  Objective:  Physical Exam: Filed Vitals:   06/28/13 1419  BP: 108/68  Pulse: 66  Temp: 97.5 F (36.4 C)  TempSrc: Oral  Height: 6\' 3"  (1.905 m)  Weight: 256 lb 9.6 oz (116.393 kg)  SpO2: 97%   Constitutional: Vital signs reviewed.  Patient is a well-developed and well-nourished male in no acute distress and cooperative with exam. Alert.  Head: Normocephalic and atraumatic Eyes: PERRL, EOMI. No scleral icterus.  Cardiovascular: RRR, no MRG, pulses symmetric and intact bilaterally Pulmonary/Chest: normal respiratory effort, CTAB, no wheezes, rales, or rhonchi Abdominal: Soft. Non-tender, non-distended, bowel sounds are normal, no masses, organomegaly, or guarding present.  GU: No CVA tenderness Musculoskeletal: TTP just lateral to his lumbosacral spine, L>R. Neurological: A&O x3, Strength is normal and symmetric bilaterally, cranial nerve II-XII are grossly intact, no focal motor deficit, sensory intact to light touch bilaterally.  Skin: Warm, dry and intact. No rash, cyanosis, or clubbing.  Psychiatric: Normal mood and affect. speech and behavior is normal. Judgment and thought content appear normal. Cognition and memory appear normal.   Assessment & Plan:   Please refer to Problem List based Assessment and Plan

## 2013-06-28 NOTE — Assessment & Plan Note (Signed)
He was seen by Dr. Dulce Sellar with [facility] with plans for an endoscopy and colonoscopy in Oct/Nov. He is to continue the Nexium daily.

## 2013-07-02 ENCOUNTER — Ambulatory Visit: Payer: BC Managed Care – PPO | Attending: Family Medicine | Admitting: Physical Therapy

## 2013-07-02 DIAGNOSIS — M545 Low back pain, unspecified: Secondary | ICD-10-CM | POA: Insufficient documentation

## 2013-07-02 DIAGNOSIS — IMO0001 Reserved for inherently not codable concepts without codable children: Secondary | ICD-10-CM | POA: Insufficient documentation

## 2013-07-10 ENCOUNTER — Ambulatory Visit: Payer: BC Managed Care – PPO | Admitting: Rehabilitation

## 2013-07-13 ENCOUNTER — Encounter
Payer: BC Managed Care – PPO | Attending: Physical Medicine & Rehabilitation | Admitting: Physical Medicine & Rehabilitation

## 2013-07-13 ENCOUNTER — Encounter: Payer: Self-pay | Admitting: Physical Medicine & Rehabilitation

## 2013-07-13 VITALS — BP 108/64 | HR 69 | Resp 14 | Ht 75.0 in | Wt 257.8 lb

## 2013-07-13 DIAGNOSIS — M7062 Trochanteric bursitis, left hip: Secondary | ICD-10-CM

## 2013-07-13 DIAGNOSIS — M47816 Spondylosis without myelopathy or radiculopathy, lumbar region: Secondary | ICD-10-CM

## 2013-07-13 DIAGNOSIS — M47817 Spondylosis without myelopathy or radiculopathy, lumbosacral region: Secondary | ICD-10-CM

## 2013-07-13 DIAGNOSIS — M76899 Other specified enthesopathies of unspecified lower limb, excluding foot: Secondary | ICD-10-CM | POA: Insufficient documentation

## 2013-07-13 DIAGNOSIS — M2170 Unequal limb length (acquired), unspecified site: Secondary | ICD-10-CM

## 2013-07-13 MED ORDER — OXYCODONE-ACETAMINOPHEN 10-325 MG PO TABS: 1.0000 | ORAL_TABLET | Freq: Three times a day (TID) | ORAL | Status: DC | PRN

## 2013-07-13 NOTE — Patient Instructions (Addendum)
YOU HAVE TO CONTINUE WITH YOUR REGULAR STRETCHING AND EXERCISING. TAKE REST BREAKS WITH WORK AS YOU CAN.  LIMIT THE USE OF YOUR BACK BRACE.

## 2013-07-13 NOTE — Progress Notes (Signed)
Subjective:    Patient ID: [name], male    DOB: Aug 24, 1956, 57 y.o.   MRN: [id number]  HPI  [name] is back regarding his low back pain. The injection we performed at last visit was helpful. He is having left "hip" pain once again. He tends to sleep on his left side quite abit. 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 He has completed PT and feels that what he's learned has been quite helpful. He is stretching regularly at home. He says that he takes rest breaks while he's working.   His low back, particularly the left side bothers him the most, especially once h'es been standing for a prolonged period of time. He has an order for a lumbar brace, and he's supposed to meet with the vendor tomorrow for delivery and fitting.  Pain Inventory Average Pain 8 Pain Right Now 2 My pain is intermittent  In the last 24 hours, has pain interfered with the following? General activity 0 Relation with others 0 Enjoyment of life 1 What TIME of day is your pain at its worst? evening Sleep (in general) Fair  Pain is worse with: bending Pain improves with: rest and medication Relief from Meds: 5  Mobility walk without assistance how many minutes can you walk? 120 ability to climb steps?  yes do you drive?  yes  Function employed # of hrs/week 30  Neuro/Psych No problems in this area  Prior Studies Any changes since last visit?  no  Physicians involved in your care Any changes since last visit?  no   History reviewed. No pertinent family history. History   Social History  . Marital Status: Married    Spouse Name: N/A    Number of Children: N/A  . Years of Education: N/A   Social History Main Topics  . Smoking status: Former Smoker -- 0.50 packs/day for 12 years    Types: Cigarettes    Quit date: 07/26/1979  . Smokeless tobacco: Never Used  . Alcohol Use: No  . Drug Use: No  . Sexual Activity: Yes   Other Topics Concern  . None   Social History Narrative   . None   Past Surgical History  Procedure Laterality Date  . Coronary angioplasty with stent placement  ~ 2005    "1"  . Cystectomy  1990's    LUA   Past Medical History  Diagnosis Date  . TOBACCO ABUSE 11/25/2006  . CORONARY ARTERY DISEASE 11/25/2006    Cardiac cath by Dr Sharyn Lull 11/2003 LV showed good LV systolic function, EF of 55-60%. Left main was patent. LAD has 20-30% mid stenosis. Diagonal 1 and diagonal 2 were patent. Left circumflex was patent. OM1 was less than 0.5 mm which was diffusely diseased. OM2 has 20% ostial stenosis which was patent which  was moderate size. OM3 was patent at prior PTCA and stented site. RCA has 20-30% proximal   . Hypertension   . Atrial arrhythmia   . Type II diabetes mellitus   . Cellulitis of knee, right 06/06/12  . Chronic otitis externa 06/06/12    C Multiple trials with antibiocs and antifungal. Cultures were positive for Pseudomonas. Follow up with ENT on 06/14/12    . NSTEMI (non-ST elevated myocardial infarction) 08/14/2012    S/p Successful PTCA to proximal OM-3      BP 108/64  Pulse 69  Resp 14  Ht 6\' 3"  (1.905 m)  Wt 257 lb 12.8 oz (116.937 kg)  BMI 32.22 kg/m2  SpO2 95%    Review of Systems  Musculoskeletal: Positive for back pain.  All other systems reviewed and are negative.       Objective:   Physical Exam  General: Alert and oriented x 3, No apparent distress. Large frame/obese  HEENT: Head is normocephalic, atraumatic, PERRLA, EOMI, sclera anicteric, oral mucosa pink and moist, dentition intact, ext ear canals clear,  Neck: Supple without JVD or lymphadenopathy  Heart: Reg rate and rhythm. No murmurs rubs or gallops  Chest: CTA bilaterally without wheezes, rales, or rhonchi; no distress  Abdomen: Soft, non-tender, non-distended, bowel sounds positive.  Extremities: No clubbing, cyanosis, or edema. Pulses are 2+  Skin: Clean and intact without signs of breakdown  Neuro: Pt is cognitively appropriate with normal  insight, memory, and awareness. Cranial nerves 2-12 are intact. Sensory exam is normal. Reflexes are 1+ in all 4's. Fine motor coordination is intact. No tremors. Motor function is grossly 5/5 except for pain inhibition in the left leg.  Musculoskeletal: Pt stands with a bit of a flexed posture but is improved. He bends to almost 90 degrees with only modest pain but tended to rotate to the right slightly. Extension caused more discomfort again today. Facet maneuver was positive left more than right. Lateral bending to the left caused discomfort as well. SLR was equivocal to negative. FABER test was equivocal. Pelvis was fairly symmetrical. Gait is noticeably improved.  . He appears more comfortable as a whole. Left greater troch was non tender----pain today is located in vague area around the left iliac crest. Psych: Pt's affect is appropriate. Pt is cooperative. He is very pleasant.  Assessment & Plan:   1. Low back pain with severe, left greater than right facet disease at L5-S1. Also with degenerative disk disease and some encroachment upon the S1 nerve root sleeve. His symptoms are most consistent with severe left L5-S1 facet arthritis. He does NOT present with symptoms consistent with a left S1 radiculopathy, although that problem may not be far behind.  2. DM2--?level of control (he does not check sugars)  3. CAD with medicated stent, on plavix  4. Greater trochanteric bursitis (left)   Plan:  1. His pain is not in his left greater troch today, but appears to be primarily referred from his back, most specifically his left L5-S1 facet which is likely referring pain to this area. We discussed the possibility of a MBB today but explained the posture, strength, ROM etc. He is not anxious to pursue MBB's but is open to them if needed. 2. Continue with stretching, modalities, ROM, and HEP----he appears to have bought into this.   3. Will again rx percocet for breakthrough pain 10/325 q8 prn  4. He is  not an NSAID candidate given medical hx. 5. I will see him back in about 3 months. 30 minutes of face to face patient care time were spent during this visit. All questions were encouraged and answered.

## 2013-07-16 ENCOUNTER — Ambulatory Visit: Payer: BC Managed Care – PPO | Admitting: Physical Medicine & Rehabilitation

## 2013-08-01 ENCOUNTER — Ambulatory Visit: Payer: BC Managed Care – PPO | Admitting: Physical Medicine & Rehabilitation

## 2013-08-07 ENCOUNTER — Encounter
Payer: BC Managed Care – PPO | Attending: Physical Medicine & Rehabilitation | Admitting: Physical Medicine & Rehabilitation

## 2013-08-07 ENCOUNTER — Encounter: Payer: Self-pay | Admitting: Physical Medicine & Rehabilitation

## 2013-08-07 VITALS — BP 104/56 | HR 71 | Resp 14 | Ht 75.0 in | Wt 258.6 lb

## 2013-08-07 DIAGNOSIS — M7062 Trochanteric bursitis, left hip: Secondary | ICD-10-CM

## 2013-08-07 DIAGNOSIS — M2170 Unequal limb length (acquired), unspecified site: Secondary | ICD-10-CM

## 2013-08-07 DIAGNOSIS — M47816 Spondylosis without myelopathy or radiculopathy, lumbar region: Secondary | ICD-10-CM

## 2013-08-07 DIAGNOSIS — M47817 Spondylosis without myelopathy or radiculopathy, lumbosacral region: Secondary | ICD-10-CM | POA: Insufficient documentation

## 2013-08-07 DIAGNOSIS — M76899 Other specified enthesopathies of unspecified lower limb, excluding foot: Secondary | ICD-10-CM | POA: Insufficient documentation

## 2013-08-07 MED ORDER — OXYCODONE-ACETAMINOPHEN 10-325 MG PO TABS: 1.0000 | ORAL_TABLET | Freq: Three times a day (TID) | ORAL | Status: DC | PRN

## 2013-08-07 NOTE — Patient Instructions (Signed)

## 2013-08-07 NOTE — Progress Notes (Signed)
Subjective:    Patient ID: [name], male    DOB: 06-Feb-1956, 57 y.o.   MRN: [id number]  HPI  [name] is back regarding his chronic back pain. About two weeks ago he developed increased pain radiating down the left leg. The pain is along the outer part of the leg but doesn't radiate past the knee. The pain grabs him when he walks or stands longer periods of time. He continues with his back exercises as we've described. He also continues to work long hours painting.  He typically sleeps on his right side, infrequently on the left or back.    Pain Inventory Average Pain 7 Pain Right Now 7 My pain is constant and throbbing  In the last 24 hours, has pain interfered with the following? General activity 7 Relation with others 7 Enjoyment of life 7 What TIME of day is your pain at its worst? night Sleep (in general) Poor  Pain is worse with: bending and sitting Pain improves with: na Relief from Meds: 3  Mobility walk without assistance how many minutes can you walk? 60 ability to climb steps?  yes do you drive?  yes Do you have any goals in this area?  no  Function employed # of hrs/week 40 Do you have any goals in this area?  no  Neuro/Psych trouble walking  Prior Studies Any changes since last visit?  no  Physicians involved in your care Any changes since last visit?  no   History reviewed. No pertinent family history. History   Social History  . Marital Status: Married    Spouse Name: [name]    Number of Children: [name]  . Years of Education: [name]   Social History Main Topics  . Smoking status: Former Smoker -- 0.50 packs/day for 12 years    Types: Cigarettes    Quit date: 07/26/1979  . Smokeless tobacco: Never Used  . Alcohol Use: No  . Drug Use: No  . Sexual Activity: Yes   Other Topics Concern  . None   Social History Narrative  . None   Past Surgical History  Procedure Laterality Date  . Coronary angioplasty with stent placement  ~ 2005   "1"  . Cystectomy  1990's    LUA   Past Medical History  Diagnosis Date  . TOBACCO ABUSE 11/25/2006  . CORONARY ARTERY DISEASE 11/25/2006    Cardiac cath by Dr Sharyn Lull 11/2003 LV showed good LV systolic function, EF of 55-60%. Left main was patent. LAD has 20-30% mid stenosis. Diagonal 1 and diagonal 2 were patent. Left circumflex was patent. OM1 was less than 0.5 mm which was diffusely diseased. OM2 has 20% ostial stenosis which was patent which  was moderate size. OM3 was patent at prior PTCA and stented site. RCA has 20-30% proximal   . Hypertension   . Atrial arrhythmia   . Type II diabetes mellitus   . Cellulitis of knee, right 06/06/12  . Chronic otitis externa 06/06/12    C Multiple trials with antibiocs and antifungal. Cultures were positive for Pseudomonas. Follow up with ENT on 06/14/12    . NSTEMI (non-ST elevated myocardial infarction) 08/14/2012    S/p Successful PTCA to proximal OM-3      BP 104/56  Pulse 71  Resp 14  Ht 6\' 3"  (1.905 m)  Wt 258 lb 9.6 oz (117.3 kg)  BMI 32.32 kg/m2  SpO2 97%     Review of Systems  Constitutional: Positive for unexpected weight change.  Musculoskeletal: Positive for gait problem.  All other systems reviewed and are negative.       Objective:   Physical Exam General: Alert and oriented x 3, No apparent distress. Large frame/obese  HEENT: Head is normocephalic, atraumatic, PERRLA, EOMI, sclera anicteric, oral mucosa pink and moist, dentition intact, ext ear canals clear,  Neck: Supple without JVD or lymphadenopathy  Heart: Reg rate and rhythm. No murmurs rubs or gallops  Chest: CTA bilaterally without wheezes, rales, or rhonchi; no distress  Abdomen: Soft, non-tender, non-distended, bowel sounds positive.  Extremities: No clubbing, cyanosis, or edema. Pulses are 2+  Skin: Clean and intact without signs of breakdown  Neuro: Pt is cognitively appropriate with normal insight, memory, and awareness. Cranial nerves 2-12 are intact.  Sensory exam is normal. Reflexes are 1+ in all 4's. Fine motor coordination is intact. No tremors. Motor function is grossly 5/5 except for pain inhibition in the left leg.  Musculoskeletal: Pt stands with a bit of a flexed posture but is improved. He bends to almost 90 degrees with only modest pain but tended to rotate to the right slightly. Extension caused more discomfort again today. Facet maneuver was positive left more than right. Lateral bending to the left caused discomfort as well. SLR was equivocal to negative. FABER test was equivocal. Pelvis was fairly symmetrical. Gait is antalgic to the left.  Left greater troch was very tender which much less pain around the iliac crest. Crossed leg maneuver was provocative of pain.  Psych: Pt's affect is appropriate. Pt is cooperative. He is very pleasant.   Assessment & Plan:   1. Low back pain with severe, left greater than right facet disease at L5-S1. Also with degenerative disk disease and some encroachment upon the S1 nerve root sleeve. His symptoms are most consistent with severe left L5-S1 facet arthritis. He does NOT present with symptoms consistent with a left S1 radiculopathy today (see below)  2. DM2--?level of control (he does not check sugars)  3. CAD with medicated stent, on plavix  4. Greater trochanteric bursitis (left)- which appears to have flared   Plan:  1. His pain is prevalent again in his left greater troch today, although there still could be a facet referral component. We have reviewed MBB's in the past.  2. Continue with stretching, modalities, ROM, and HEP----he appears to have bought into this.  3. Will again rx percocet for breakthrough pain 10/325 q8 prn  4. After informed consent and preparation of the skin with betadine and isopropyl alcohol, I injected 40mg  of methylprednisolone and 4cc of 1% lidocaine around the left greater troch via lateral approach. The patient tolerated well, and no complications were encountered.  Afterward the area was cleaned and dressed. Post- injection instructions were provided. Educational info was presented as well. He needs to think seriously about his job as well.  5. I will see him back in about 3 months. 30 minutes of face to face patient care time were spent during this visit. All questions were encouraged and answered.

## 2013-08-14 ENCOUNTER — Telehealth: Payer: Self-pay

## 2013-08-14 NOTE — Telephone Encounter (Signed)
Octavio Graves called in regards to patient.  Did not leave any details.  Left message for her to call back with reason for call.

## 2013-08-14 NOTE — Telephone Encounter (Signed)
Bonita(Patient's sister) called back she is wanting to know what the prognosis and treatment plan is for the patient's back pain. She states he is in severe pain.

## 2013-08-14 NOTE — Telephone Encounter (Signed)
i spoke with the patient at length at his last visit. I am not sure that this sister was the one with him in the room. (i think it was his wife). The patient is competent to make his own decisions.  The patient has not wanted to pursue MBB's which I would recommend next to help treat this pain if it remained persistent. i did CLEARLY offer him earlier follow up if needed.

## 2013-08-23 ENCOUNTER — Encounter: Payer: Self-pay | Admitting: Internal Medicine

## 2013-08-23 ENCOUNTER — Ambulatory Visit (INDEPENDENT_AMBULATORY_CARE_PROVIDER_SITE_OTHER): Payer: BC Managed Care – PPO | Admitting: Internal Medicine

## 2013-08-23 VITALS — BP 125/81 | HR 59 | Temp 96.5°F | Ht 75.0 in | Wt 253.5 lb

## 2013-08-23 DIAGNOSIS — Z299 Encounter for prophylactic measures, unspecified: Secondary | ICD-10-CM

## 2013-08-23 DIAGNOSIS — E119 Type 2 diabetes mellitus without complications: Secondary | ICD-10-CM

## 2013-08-23 DIAGNOSIS — M76899 Other specified enthesopathies of unspecified lower limb, excluding foot: Secondary | ICD-10-CM

## 2013-08-23 DIAGNOSIS — M47817 Spondylosis without myelopathy or radiculopathy, lumbosacral region: Secondary | ICD-10-CM

## 2013-08-23 DIAGNOSIS — G8929 Other chronic pain: Secondary | ICD-10-CM

## 2013-08-23 DIAGNOSIS — I252 Old myocardial infarction: Secondary | ICD-10-CM

## 2013-08-23 DIAGNOSIS — L210 Seborrhea capitis: Secondary | ICD-10-CM

## 2013-08-23 DIAGNOSIS — IMO0002 Reserved for concepts with insufficient information to code with codable children: Secondary | ICD-10-CM

## 2013-08-23 DIAGNOSIS — M7062 Trochanteric bursitis, left hip: Secondary | ICD-10-CM

## 2013-08-23 DIAGNOSIS — I2510 Atherosclerotic heart disease of native coronary artery without angina pectoris: Secondary | ICD-10-CM

## 2013-08-23 DIAGNOSIS — I10 Essential (primary) hypertension: Secondary | ICD-10-CM

## 2013-08-23 DIAGNOSIS — Z23 Encounter for immunization: Secondary | ICD-10-CM

## 2013-08-23 MED ORDER — NITROGLYCERIN 0.4 MG SL SUBL: 0.4000 mg | SUBLINGUAL_TABLET | SUBLINGUAL | Status: DC | PRN

## 2013-08-23 MED ORDER — LISINOPRIL 5 MG PO TABS: 5.0000 mg | ORAL_TABLET | Freq: Every day | ORAL | Status: DC

## 2013-08-23 MED ORDER — GLIPIZIDE 10 MG PO TABS: 10.0000 mg | ORAL_TABLET | Freq: Every day | ORAL | Status: DC

## 2013-08-23 MED ORDER — ASPIRIN EC 81 MG PO TBEC: 81.0000 mg | DELAYED_RELEASE_TABLET | Freq: Every day | ORAL | Status: DC

## 2013-08-23 MED ORDER — SELENIUM SULFIDE 2.5 % EX LOTN: TOPICAL_LOTION | CUTANEOUS | Status: DC

## 2013-08-23 MED ORDER — ISOSORBIDE MONONITRATE ER 30 MG PO TB24: 30.0000 mg | ORAL_TABLET | Freq: Every day | ORAL | Status: DC

## 2013-08-23 MED ORDER — SOTALOL HCL 80 MG PO TABS: 40.0000 mg | ORAL_TABLET | Freq: Two times a day (BID) | ORAL | Status: DC

## 2013-08-23 MED ORDER — PRAVASTATIN SODIUM 80 MG PO TABS: 80.0000 mg | ORAL_TABLET | Freq: Every day | ORAL | Status: DC

## 2013-08-23 MED ORDER — METFORMIN HCL 1000 MG PO TABS: 1000.0000 mg | ORAL_TABLET | Freq: Two times a day (BID) | ORAL | Status: DC

## 2013-08-23 NOTE — Assessment & Plan Note (Signed)
Seeing pain specialist, Dr. Riley Moody for his next scheduled appointment in November. He was recently given a steroid injection in his left hip, which patient states has not improved his pain. He requests a new prescription for Ultram. However since he's being followed by a pain specialist told him that he will need to receive his pain medication from Dr. Riley Moody. Encouraged him to call Dr. Rosalyn Moody office today after his appointment for her in the morning which the patient stated he would do.

## 2013-08-23 NOTE — Assessment & Plan Note (Signed)
BP Readings from Last 3 Encounters:  08/23/13 125/81  08/07/13 104/56  07/13/13 108/64    Lab Results  Component Value Date   NA 139 05/10/2013   K 4.3 05/10/2013   CREATININE 0.65 05/10/2013    Assessment: Blood pressure control: controlled Progress toward BP goal:  at goal   Plan: Medications:  continue current medications Educational resources provided: brochure;handout;video Self management tools provided:

## 2013-08-23 NOTE — Progress Notes (Signed)
Patient ID: [name], male   DOB: 01/19/1956, 57 y.o.   MRN: [id number]  Subjective:   Patient ID: [name] male   DOB: Nov 13, 1956 57 y.o.   MRN: [id number]  HPI: [name] is a 57 y.o. M  He was seen by Dr. Riley Kill with Pain Management. He was last seen on 08/07/13 c/o increasing pain down his left leg stopping before the knee. It comes on when ambulating or standing for long periods of time. At that time, the left greater trochanter was injected with methylprednisolone. He is to f/u in 3 mo.  Today he presents c/o back pain, asking about further pain medication. 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   He is also requesting a 90 days supply for his prescriptions.   Past Medical History  Diagnosis Date  . TOBACCO ABUSE 11/25/2006  . CORONARY ARTERY DISEASE 11/25/2006    Cardiac cath by Dr Sharyn Lull 11/2003 LV showed good LV systolic function, EF of 55-60%. Left main was patent. LAD has 20-30% mid stenosis. Diagonal 1 and diagonal 2 were patent. Left circumflex was patent. OM1 was less than 0.5 mm which was diffusely diseased. OM2 has 20% ostial stenosis which was patent which  was moderate size. OM3 was patent at prior PTCA and stented site. RCA has 20-30% proximal   . Hypertension   . Atrial arrhythmia   . Type II diabetes mellitus   . Cellulitis of knee, right 06/06/12  . Chronic otitis externa 06/06/12    C Multiple trials with antibiocs and antifungal. Cultures were positive for Pseudomonas. Follow up with ENT on 06/14/12    . NSTEMI (non-ST elevated myocardial infarction) 08/14/2012    S/p Successful PTCA to proximal OM-3      Current Outpatient Prescriptions  Medication Sig Dispense Refill  . aspirin 81 MG tablet Take 1 tablet (81 mg total) by mouth daily.  90 tablet  1  . clopidogrel (PLAVIX) 75 MG tablet Take 75 mg by mouth daily.      . diclofenac sodium (VOLTAREN) 1 % GEL Apply 2 g  topically 4 (four) times daily.  100 g  0  . esomeprazole (NEXIUM) 20 MG capsule Take 1 capsule (20 mg total) by mouth daily.  30 capsule  1  . gabapentin (NEURONTIN) 300 MG capsule Take 1 pill at night for 3 days, then 1 pill two times a day for 3 days, and then three times a day therafter.  90 capsule  2  . glipiZIDE (GLUCOTROL) 10 MG tablet Take 1 tablet (10 mg total) by mouth daily.  90 tablet  3  . glucose blood (TRUETRACK TEST) test strip 1 each by Other route 3 (three) times daily. Use as instructed       . isosorbide mononitrate (IMDUR) 30 MG 24 hr tablet Take 1 tablet (30 mg total) by mouth daily.  90 tablet  3  . Lancets MISC by Does not apply route 3 (three) times daily.        Marland Kitchen lisinopril (PRINIVIL,ZESTRIL) 5 MG tablet Take 1 tablet (5 mg total) by mouth daily.  90 tablet  3  . metFORMIN (GLUCOPHAGE) 1000 MG tablet Take 1 tablet (1,000 mg total) by mouth 2 (two) times daily with a meal.  180 tablet  3  . nitroGLYCERIN (NITROSTAT) 0.4 MG SL tablet Place 1 tablet (0.4 mg total) under the tongue  every 5 (five) minutes as needed. Do not take more then 3 tablets in 15 min.  30 tablet  1  . oxyCODONE-acetaminophen (PERCOCET) 10-325 MG per tablet Take 1 tablet by mouth every 8 (eight) hours as needed for pain.  60 tablet  0  . pravastatin (PRAVACHOL) 80 MG tablet Take 1 tablet (80 mg total) by mouth daily.  30 tablet  11  . selenium sulfide (SELSUN) 2.5 % shampoo Massage 5-10 cc on wet scalpe and leave it on for  10 minutes and rinse it throughout. Do it every day for 7 days.  After that do it 2 times a week for 2 weeks and then do it as needed  118 mL  2  . sotalol (BETAPACE) 80 MG tablet Take 0.5 tablets (40 mg total) by mouth 2 (two) times daily. Initially prescribed by Dr Sharyn Lull  180 tablet  3  . sucralfate (CARAFATE) 1 G tablet Take 1 tablet (1 g total) by mouth 4 (four) times daily.  120 tablet  1  . traMADol (ULTRAM) 50 MG tablet Take 1 tablet (50 mg total) by mouth every 8 (eight)  hours as needed for pain.  60 tablet  0   No current facility-administered medications for this visit.   No family history on file. History   Social History  . Marital Status: Married    Spouse Name: N/A    Number of Children: N/A  . Years of Education: N/A   Social History Main Topics  . Smoking status: Former Smoker -- 0.50 packs/day for 12 years    Types: Cigarettes    Quit date: 07/26/1979  . Smokeless tobacco: Never Used  . Alcohol Use: No  . Drug Use: No  . Sexual Activity: Yes   Other Topics Concern  . None   Social History Narrative  . None   Review of Systems: Constitutional: Denies fever, chills, diaphoresis, appetite change and fatigue.  HEENT: Denies photophobia, eye pain, redness, hearing loss, ear pain, congestion, sore throat, rhinorrhea, sneezing, mouth sores, trouble swallowing, neck pain, neck stiffness and tinnitus.   Respiratory: Denies SOB, DOE, cough, chest tightness,  and wheezing.   Cardiovascular: Denies chest pain, palpitations and leg swelling.  Gastrointestinal: Denies nausea, vomiting, abdominal pain, diarrhea, constipation, blood in stool and abdominal distention.  Genitourinary: Denies dysuria, urgency, frequency, hematuria, flank pain and difficulty urinating.  Endocrine: Denies: hot or cold intolerance, sweats, changes in hair or nails, polyuria, polydipsia. Musculoskeletal: + lumbosacral pain radiating through his hips (L>R) and into his left leg down the anterior of his left thigh that has worsened over the past week or 2. Skin: Denies pallor, rash and wound.  Neurological: Denies dizziness, seizures, syncope, weakness, light-headedness, numbness and headaches.  Psychiatric/Behavioral: Denies suicidal ideation, mood changes, confusion, nervousness, sleep disturbance and agitation  Objective:  Physical Exam: Filed Vitals:   08/23/13 1434  BP: 125/81  Pulse: 59  Temp: 96.5 F (35.8 C)  TempSrc: Oral  Height: 6\' 3"  (1.905 m)  Weight:  253 lb 8 oz (114.987 kg)  SpO2: 96%   Constitutional: Vital signs reviewed.  Patient is a well-developed and well-nourished male in no acute distress and cooperative with exam. Alert and oriented x3.  Head: Normocephalic and atraumatic Ear: TM normal bilaterally Nose: No erythema or drainage noted.  Turbinates normal Mouth: no erythema or exudates, MMM Eyes: PERRL, EOMI, conjunctivae normal, No scleral icterus.  Neck: Supple, Trachea midline normal ROM, No JVD Cardiovascular: RRR, S1 normal, S2 normal, no MRG,  pulses symmetric and intact bilaterally Pulmonary/Chest: normal respiratory effort, CTAB, no wheezes, rales, or rhonchi Abdominal: Soft. Non-tender, non-distended Musculoskeletal: +stiffness in lumbar spine and with left hip flexion, and appears to be in pain. Full ROM with left hip flexion. No joint deformities, erythema, or stiffness, ROM full and no nontender Neurological: A&O x3, Strength is normal and symmetric bilaterally, cranial nerve II-XII are grossly intact, no focal deficits, but appears to have significant pain with LLE and lumbar spinal motion. Skin: Warm, dry and intact. No rash, cyanosis, or clubbing.  Psychiatric: Appears to be in pain. Judgment and thought content appear normal. Cognition and memory appear normal.   Assessment & Plan:   Please refer to Problem List based Assessment and Plan

## 2013-08-23 NOTE — Assessment & Plan Note (Signed)
Patient is to followup in clinic in one month for his hemoglobin A1c recheck.

## 2013-08-23 NOTE — Assessment & Plan Note (Signed)
Patient to call Dr. Riley Kill for better pain control and to be seen sooner than November. He states there is some sort of two-hour procedure that Dr. Riley Kill and discuss with him. The patient is unable to give me the details, and I don't see any reference to it in the patient's last pain clinic progress note.

## 2013-08-23 NOTE — Patient Instructions (Addendum)
**  Please call Dr. Rosalyn Charters office regarding your request for a different pain regimen. Since you have been seeing him for your pain management, he needs to be the one to prescribe the pain medication.   **I am prescribing a 90 day supply for the majority of your other medications today.   Chronic Back Pain  When back pain lasts longer than 3 months, it is called chronic back pain.People with chronic back pain often go through certain periods that are more intense (flare-ups).  CAUSES Chronic back pain can be caused by wear and tear (degeneration) on different structures in your back. These structures include:  The bones of your spine (vertebrae) and the joints surrounding your spinal cord and nerve roots (facets).  The strong, fibrous tissues that connect your vertebrae (ligaments). Degeneration of these structures may result in pressure on your nerves. This can lead to constant pain. HOME CARE INSTRUCTIONS  Avoid bending, heavy lifting, prolonged sitting, and activities which make the problem worse.  Take brief periods of rest throughout the day to reduce your pain. Lying down or standing usually is better than sitting while you are resting.  Take over-the-counter or prescription medicines only as directed by your caregiver. SEEK IMMEDIATE MEDICAL CARE IF:   You have weakness or numbness in one of your legs or feet.  You have trouble controlling your bladder or bowels.  You have nausea, vomiting, abdominal pain, shortness of breath, or fainting. Document Released: 12/23/2004 Document Revised: 02/07/2012 Document Reviewed: 10/30/2011 [facility] Patient Information 2014 [place], [place].

## 2013-08-24 NOTE — Progress Notes (Signed)
Case discussed with Dr. Glenn at the time of the visit.  We reviewed the resident's history and exam and pertinent patient test results.  I agree with the assessment, diagnosis, and plan of care documented in the resident's note.   

## 2013-09-03 ENCOUNTER — Telehealth: Payer: Self-pay

## 2013-09-03 DIAGNOSIS — M47816 Spondylosis without myelopathy or radiculopathy, lumbar region: Secondary | ICD-10-CM

## 2013-09-03 DIAGNOSIS — M47817 Spondylosis without myelopathy or radiculopathy, lumbosacral region: Secondary | ICD-10-CM

## 2013-09-03 DIAGNOSIS — M7062 Trochanteric bursitis, left hip: Secondary | ICD-10-CM

## 2013-09-03 NOTE — Telephone Encounter (Signed)
Patient called to request percocet refill and to make an appointment for a procedure but he was unsure of procedure.  Looks like notes say medial branch block.  Please advise.

## 2013-09-03 NOTE — Telephone Encounter (Signed)
Ok to refill percocet. Bilateral MBB's at L5-S1 per Dr. Kirtland Bouchard

## 2013-09-03 NOTE — Telephone Encounter (Signed)
Patient called requesting medication refills didn't say which medications. Patient also mentioned he was interested in getting more information about procedures that the clinic offers.

## 2013-09-04 ENCOUNTER — Encounter: Payer: BC Managed Care – PPO | Attending: Physical Medicine & Rehabilitation

## 2013-09-04 ENCOUNTER — Encounter: Payer: Self-pay | Admitting: Physical Medicine & Rehabilitation

## 2013-09-04 ENCOUNTER — Ambulatory Visit (HOSPITAL_BASED_OUTPATIENT_CLINIC_OR_DEPARTMENT_OTHER): Payer: BC Managed Care – PPO | Admitting: Physical Medicine & Rehabilitation

## 2013-09-04 VITALS — BP 115/60 | HR 73 | Resp 14 | Ht 75.0 in | Wt 254.0 lb

## 2013-09-04 DIAGNOSIS — M76899 Other specified enthesopathies of unspecified lower limb, excluding foot: Secondary | ICD-10-CM | POA: Insufficient documentation

## 2013-09-04 DIAGNOSIS — M47817 Spondylosis without myelopathy or radiculopathy, lumbosacral region: Secondary | ICD-10-CM

## 2013-09-04 MED ORDER — OXYCODONE-ACETAMINOPHEN 10-325 MG PO TABS: 1.0000 | ORAL_TABLET | Freq: Three times a day (TID) | ORAL | Status: DC | PRN

## 2013-09-04 NOTE — Telephone Encounter (Signed)
In a great deal of pain.  Out of medication and would like the doctor to do the procedure he was told about.  Dr Wynn Banker has an appt open today.  Approval obtained from ins for the injection and [name] notified.  He has eaten but told not to eat anything else and to bring a driver.  He is not on any blood thinners.  He will be here by 10 am. RX printed for percocet.

## 2013-09-04 NOTE — Patient Instructions (Addendum)

## 2013-09-04 NOTE — Progress Notes (Signed)
Bilateral Lumbar  L4  medial branch blocks and L 5 dorsal ramus injection under fluoroscopic guidance  Indication: Lumbar pain which is not relieved by medication management or other conservative care and interfering with self-care and mobility.  Informed consent was obtained after describing risks and benefits of the procedure with the patient, this includes bleeding, infection, paralysis and medication side effects.  The patient wishes to proceed and has given written consent.  The patient was placed in prone position.  The lumbar area was marked and prepped with Betadine.  One mL of 1% lidocaine was injected into each of 6 areas into the skin and subcutaneous tissue.  Then a 22-gauge 3.5 inch spinal needle was inserted targeting the junction of the left S1 superior articular process and sacral ala junction. Needle was advanced under fluoroscopic guidance.  Bone contact was made.  Omnipaque 180 was injected x 0.5 mL demonstrating no intravascular uptake.  Then a solution containing one mL of 4 mg per mL dexamethasone and 3 mL of 2% MPF lidocaine was injected x 0.5 mL.  Then the left L5 superior articular process in transverse process junction was targeted.  Bone contact was made.  Omnipaque 180 was injected x 0.5 mL demonstrating no intravascular uptake. Then a solution containing one mL of 4 mg per mL dexamethasone and 3 mL of 2% MPF lidocaine was injected x 0.5 mL.   This same procedure was performed on the right side using the same needle, technique and injectate.  Patient tolerated procedure well.  Post procedure instructions were given.  Reinjection pain level 10 Post injection pain level 3-4

## 2013-09-04 NOTE — Progress Notes (Signed)
  PROCEDURE RECORD [facility]   Name: [name] DOB:07-22-1956 MRN: [id number]  Date:09/04/2013  Physician: Claudette Laws, MD    Nurse/CMA: Cielle Aguila CMA  Allergies: No Known Allergies  Consent Signed: yes  Is patient diabetic? yes  CBG today? no  Pregnant: no LMP: No LMP for male patient. (age 56-55)  Anticoagulants: no Anti-inflammatory: no Antibiotics: no  Procedure: Bilateral L5-S1 Medial Branch Block Position: Prone Start Time: 10:48  End Time: 11:03 Fluoro Time:26 seconds   RN/CMA Jaquavion Mccannon CMA Halston Kintz CMA    Time 10:23 11:05    BP 115/60 125/67    Pulse 73 77    Respirations 14 14    O2 Sat 96 99    S/S 6 6    Pain Level 10/10 3/10     D/C home with [name]( neighbor) , patient A & O X 3, D/C instructions reviewed, and sits independently.

## 2013-09-20 ENCOUNTER — Encounter: Payer: BC Managed Care – PPO | Admitting: Internal Medicine

## 2013-09-25 ENCOUNTER — Other Ambulatory Visit: Payer: Self-pay | Admitting: *Deleted

## 2013-10-02 ENCOUNTER — Other Ambulatory Visit: Payer: Self-pay | Admitting: *Deleted

## 2013-10-02 NOTE — Telephone Encounter (Signed)
Pt is switching suppliers of diab supplies, they need new scripts

## 2013-10-04 ENCOUNTER — Ambulatory Visit (INDEPENDENT_AMBULATORY_CARE_PROVIDER_SITE_OTHER): Payer: BC Managed Care – PPO | Admitting: Internal Medicine

## 2013-10-04 ENCOUNTER — Encounter: Payer: Self-pay | Admitting: Internal Medicine

## 2013-10-04 VITALS — BP 96/58 | HR 67 | Temp 97.9°F | Ht 75.0 in | Wt 255.0 lb

## 2013-10-04 DIAGNOSIS — M47817 Spondylosis without myelopathy or radiculopathy, lumbosacral region: Secondary | ICD-10-CM

## 2013-10-04 DIAGNOSIS — G8929 Other chronic pain: Secondary | ICD-10-CM

## 2013-10-04 DIAGNOSIS — I10 Essential (primary) hypertension: Secondary | ICD-10-CM

## 2013-10-04 DIAGNOSIS — E119 Type 2 diabetes mellitus without complications: Secondary | ICD-10-CM

## 2013-10-04 DIAGNOSIS — IMO0002 Reserved for concepts with insufficient information to code with codable children: Secondary | ICD-10-CM

## 2013-10-04 LAB — POCT GLYCOSYLATED HEMOGLOBIN (HGB A1C): Hemoglobin A1C: 6.6

## 2013-10-04 MED ORDER — ONETOUCH ULTRA MINI W/DEVICE KIT: PACK | Status: DC

## 2013-10-04 MED ORDER — GLUCOSE BLOOD VI STRP: ORAL_STRIP | Status: DC

## 2013-10-04 MED ORDER — ONETOUCH DELICA LANCETS FINE MISC: 1.0000 | Freq: Three times a day (TID) | Status: DC

## 2013-10-04 NOTE — Progress Notes (Signed)
Patient ID: [name], male   DOB: Mar 15, 1956, 57 y.o.   MRN: [id number]  Subjective:   Patient ID: [name] male   DOB: [birth year] 57 y.o.   MRN: [id number]  HPI: [name] is a 57 y.o. M with PMH GERD, chronic back pain, CAD s/p stenting last Sept, HTN, and DM2 presents for a rouitine follow up for his pain and diabetes and HTN.  Pain is much improved and is a 4/10 today. He saw Dr. Wynn Banker on 10/7 who preformed a L4 medial branch blocks and L 5 dorsal ramus injection.  Pt states that he is exercising daily doing strength training an back strengthening exercises and stretching, which he thinks is improving his pain too.   C/o tooth pain for "some time now". States he would like to see a dentist.   Pt endorses taking all of his medications as prescribed.   Past Medical History  Diagnosis Date  . TOBACCO ABUSE 11/25/2006  . CORONARY ARTERY DISEASE 11/25/2006    Cardiac cath by Dr Sharyn Lull 11/2003 LV showed good LV systolic function, EF of 55-60%. Left main was patent. LAD has 20-30% mid stenosis. Diagonal 1 and diagonal 2 were patent. Left circumflex was patent. OM1 was less than 0.5 mm which was diffusely diseased. OM2 has 20% ostial stenosis which was patent which  was moderate size. OM3 was patent at prior PTCA and stented site. RCA has 20-30% proximal   . Hypertension   . Atrial arrhythmia   . Type II diabetes mellitus   . Cellulitis of knee, right 06/06/12  . Chronic otitis externa 06/06/12    C Multiple trials with antibiocs and antifungal. Cultures were positive for Pseudomonas. Follow up with ENT on 06/14/12    . NSTEMI (non-ST elevated myocardial infarction) 08/14/2012    S/p Successful PTCA to proximal OM-3      Current Outpatient Prescriptions  Medication Sig Dispense Refill  . aspirin EC 81 MG tablet Take 1 tablet (81 mg total) by mouth daily.  90 tablet  3  . diclofenac sodium (VOLTAREN) 1 % GEL Apply 2 g topically 4 (four) times daily.  100 g  0  . esomeprazole  (NEXIUM) 20 MG capsule Take 1 capsule (20 mg total) by mouth daily.  30 capsule  1  . gabapentin (NEURONTIN) 300 MG capsule Take 1 pill at night for 3 days, then 1 pill two times a day for 3 days, and then three times a day therafter.  90 capsule  2  . glipiZIDE (GLUCOTROL) 10 MG tablet Take 1 tablet (10 mg total) by mouth daily.  90 tablet  3  . glucose blood (TRUETRACK TEST) test strip 1 each by Other route 3 (three) times daily. Use as instructed       . isosorbide mononitrate (IMDUR) 30 MG 24 hr tablet Take 1 tablet (30 mg total) by mouth daily.  90 tablet  3  . Lancets MISC by Does not apply route 3 (three) times daily.        Marland Kitchen lisinopril (PRINIVIL,ZESTRIL) 5 MG tablet Take 1 tablet (5 mg total) by mouth daily.  90 tablet  3  . metFORMIN (GLUCOPHAGE) 1000 MG tablet Take 1 tablet (1,000 mg total) by mouth 2 (two) times daily with a meal.  180 tablet  3  . nitroGLYCERIN (NITROSTAT) 0.4 MG SL tablet Place 1 tablet (0.4 mg total) under the tongue every 5 (five) minutes as needed. Do not take more then 3 tablets in  15 min.  30 tablet  1  . oxyCODONE-acetaminophen (PERCOCET) 10-325 MG per tablet Take 1 tablet by mouth every 8 (eight) hours as needed for pain.  60 tablet  0  . pravastatin (PRAVACHOL) 80 MG tablet Take 1 tablet (80 mg total) by mouth daily.  90 tablet  3  . selenium sulfide (SELSUN) 2.5 % shampoo Massage 5-10 cc on wet scalpe and leave it on for  10 minutes and rinse it throughout. Do it every day for 7 days.  After that do it 2 times a week for 2 weeks and then do it as needed  118 mL  2  . sotalol (BETAPACE) 80 MG tablet Take 0.5 tablets (40 mg total) by mouth 2 (two) times daily. Initially prescribed by Dr Sharyn Lull  180 tablet  3  . sucralfate (CARAFATE) 1 G tablet Take 1 tablet (1 g total) by mouth 4 (four) times daily.  120 tablet  1  . traMADol (ULTRAM) 50 MG tablet Take 1 tablet (50 mg total) by mouth every 8 (eight) hours as needed for pain.  60 tablet  0   No current  facility-administered medications for this visit.   No family history on file. History   Social History  . Marital Status: Married    Spouse Name: N/A    Number of Children: N/A  . Years of Education: N/A   Social History Main Topics  . Smoking status: Former Smoker -- 0.50 packs/day for 12 years    Types: Cigarettes    Quit date: 07/26/1979  . Smokeless tobacco: Never Used  . Alcohol Use: No  . Drug Use: No  . Sexual Activity: Yes   Other Topics Concern  . None   Social History Narrative  . None   Review of Systems: A 12 point ROS was performed; pertinent positives and negatives were noted in the HPI   Objective:  Physical Exam: Filed Vitals:   10/04/13 1551  BP: 96/58  Pulse: 67  Temp: 97.9 F (36.6 C)  TempSrc: Oral  Height: 6\' 3"  (1.905 m)  Weight: 255 lb (115.667 kg)  SpO2: 98%   Constitutional: Vital signs reviewed.  Patient is a well-developed and well-nourished male in no acute distress and cooperative with exam.  Head: Normocephalic and atraumatic Mouth: MMM, left canine with infection at the root with darkened gum line.   Eyes: PERRL, EOMI, conjunctivae normal, No scleral icterus. .  Cardiovascular: RRR, no MRG, pulses symmetric and intact bilaterally Pulmonary/Chest: normal respiratory effort, CTAB, no wheezes, rales, or rhonchi Abdominal: Soft. Non-tender, non-distended Musculoskeletal: No joint deformities, erythema, or stiffness. TTP down lumbar spine Neurological: A&O x3, cranial nerve II-XII are grossly intact, no focal motor deficit, sensory intact to light touch bilaterally.  Skin: Warm, dry and intact. No rash, cyanosis, or clubbing. Toenails thickened Psychiatric: Normal mood and affect. speech and behavior is normal. Judgment and thought content normal. Cognition and memory are normal.   Assessment & Plan:   Please refer to Problem List based Assessment and Plan

## 2013-10-08 NOTE — Assessment & Plan Note (Addendum)
Lab Results  Component Value Date   HGBA1C 6.6 10/04/2013   HGBA1C 7.0 06/28/2013   HGBA1C 7.8 04/09/2013     Assessment: Diabetes control: good control (HgbA1C at goal) Progress toward A1C goal:  improved Comments: Patient states that he has been exercising every day, and is really working on improving his diet. He continues to take his medications as prescribed, glipizide and metformin. Of note his exam is normal, except that his toenails are thickened, and need to be trimmed.  Plan: Medications:  continue current medications Home glucose monitoring: Frequency: once a day Timing: before meals Instruction/counseling given: reminded to bring blood glucose meter & log to each visit, reminded to bring medications to each visit and discussed diet Educational resources provided: brochure;handout Self management tools provided:   Other plans: On exam, his toenails are thickened, so I am referring him to podiatry. A new meter, lancets, test strips were prescribed for him today, any supposed to check his sugars at least once a day before meals once he gets the meter. Followup in 3 months.

## 2013-10-08 NOTE — Assessment & Plan Note (Signed)
Pain is much improved today, at a 4/10. He had an L4 medial branch block and L5 dorsal ramus injection on 09/04/13 at Dr. Kirsteins' office. Since that time his pain is greatly improved. He states he is exercising every day, and feels that this has also helped improve his pain. I encouraged him to continue with exercise as as tolerated, but not to overdo it, because over exertion exacerbates his pain.   

## 2013-10-08 NOTE — Assessment & Plan Note (Signed)
BP Readings from Last 3 Encounters:  10/04/13 96/58  09/04/13 115/60  08/23/13 125/81    Lab Results  Component Value Date   NA 139 05/10/2013   K 4.3 05/10/2013   CREATININE 0.65 05/10/2013    Assessment: Blood pressure control: controlled (Is a bit hypotensive, which is not unusual for him) Progress toward BP goal:  unchanged Comments: He is completely asymptomatic with this lower blood pressure. Will continue to monitor and when he comes back in 3 months, will reassess.   Plan: Medications:  continue current medications Educational resources provided: brochure;handout;video

## 2013-10-08 NOTE — Progress Notes (Signed)
Case discussed with Dr. Sherrine Maples soon after the resident saw the patient.  We reviewed the resident's history and exam and pertinent patient test results.  I agree with the assessment, diagnosis and plan of care documented in the resident's note with one exception.  Given his good diabetic control there is no need to check daily sugars as this has not been shown to improve control but increases costs when type II diabetes is so well controlled.  If [name]' sugars remain well controlled will consider stopping daily sugar checks.

## 2013-10-08 NOTE — Assessment & Plan Note (Signed)
Pain is much improved today, at a 4/10. He had an L4 medial branch block and L5 dorsal ramus injection on 09/04/13 at Dr. Wynn Banker' office. Since that time his pain is greatly improved. He states he is exercising every day, and feels that this has also helped improve his pain. I encouraged him to continue with exercise as as tolerated, but not to overdo it, because over exertion exacerbates his pain.

## 2013-10-10 ENCOUNTER — Encounter: Payer: Self-pay | Admitting: Physical Medicine & Rehabilitation

## 2013-10-10 ENCOUNTER — Encounter
Payer: BC Managed Care – PPO | Attending: Physical Medicine & Rehabilitation | Admitting: Physical Medicine & Rehabilitation

## 2013-10-10 VITALS — BP 124/73 | HR 70 | Resp 16 | Ht 75.0 in | Wt 255.0 lb

## 2013-10-10 DIAGNOSIS — M76899 Other specified enthesopathies of unspecified lower limb, excluding foot: Secondary | ICD-10-CM | POA: Insufficient documentation

## 2013-10-10 DIAGNOSIS — Z79899 Other long term (current) drug therapy: Secondary | ICD-10-CM

## 2013-10-10 DIAGNOSIS — M7062 Trochanteric bursitis, left hip: Secondary | ICD-10-CM

## 2013-10-10 DIAGNOSIS — M47816 Spondylosis without myelopathy or radiculopathy, lumbar region: Secondary | ICD-10-CM

## 2013-10-10 DIAGNOSIS — M47817 Spondylosis without myelopathy or radiculopathy, lumbosacral region: Secondary | ICD-10-CM

## 2013-10-10 DIAGNOSIS — Z5181 Encounter for therapeutic drug level monitoring: Secondary | ICD-10-CM

## 2013-10-10 MED ORDER — OXYCODONE-ACETAMINOPHEN 10-325 MG PO TABS: 1.0000 | ORAL_TABLET | Freq: Three times a day (TID) | ORAL | Status: DC | PRN

## 2013-10-10 NOTE — Patient Instructions (Signed)
Continue to work on posture and your exercises at home.

## 2013-10-10 NOTE — Progress Notes (Signed)
Subjective:    Patient ID: [name], male    DOB: 02-26-1956, 57 y.o.   MRN: [id number]  HPI  [name] is back regarding his back pain. He had almost 50% relief with the block especially for the first 10 days. The pain has begun to recur however. He had bilateral L5-S1 MBB's by Dr. Wynn Moody on 10/16.   He still is relatively limited in his activity tolerance due to pain. He is keeping up with his stretching program.  He takes his percocet for breakthrough pain.   Pain Inventory Average Pain 3 Pain Right Now 7 My pain is n/a  In the last 24 hours, has pain interfered with the following? General activity 1 Relation with others 1 Enjoyment of life 2 What TIME of day is your pain at its worst? evening and night Sleep (in general) Poor  Pain is worse with: standing and some activites Pain improves with: medication and injections Relief from Meds: 6  Mobility how many minutes can you walk? 60 ability to climb steps?  yes do you drive?  yes Do you have any goals in this area?  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  yes  Neuro/Psych No problems in this area  Prior Studies Any changes since last visit?  no  Physicians involved in your care Any changes since last visit?  no   History reviewed. No pertinent family history. History   Social History  . Marital Status: Married    Spouse Name: N/A    Number of Children: N/A  . Years of Education: N/A   Social History Main Topics  . Smoking status: Former Smoker -- 0.50 packs/day for 12 years    Types: Cigarettes    Quit date: 07/26/1979  . Smokeless tobacco: Never Used  . Alcohol Use: No  . Drug Use: No  . Sexual Activity: Yes   Other Topics Concern  . None   Social History Narrative  . None   Past Surgical History  Procedure Laterality Date  . Coronary angioplasty with stent placement  ~ 2005    "1"  . Cystectomy  1990's    LUA   Past Medical History  Diagnosis  Date  . TOBACCO ABUSE 11/25/2006  . CORONARY ARTERY DISEASE 11/25/2006    Cardiac cath by Dr [name] 11/2003 LV showed good LV systolic function, EF of 55-60%. Left main was patent. LAD has 20-30% mid stenosis. Diagonal 1 and diagonal 2 were patent. Left circumflex was patent. OM1 was less than 0.5 mm which was diffusely diseased. OM2 has 20% ostial stenosis which was patent which  was moderate size. OM3 was patent at prior PTCA and stented site. RCA has 20-30% proximal   . Hypertension   . Atrial arrhythmia   . Type II diabetes mellitus   . Cellulitis of knee, right 06/06/12  . Chronic otitis externa 06/06/12    C Multiple trials with antibiocs and antifungal. Cultures were positive for Pseudomonas. Follow up with ENT on 06/14/12    . NSTEMI (non-ST elevated myocardial infarction) 08/14/2012    S/p Successful PTCA to proximal OM-3      BP 124/73  Pulse 70  Resp 16  Ht 6\' 3"  (1.905 m)  Wt 255 lb (115.667 kg)  BMI 31.87 kg/m2  SpO2 99%     Review of Systems  Musculoskeletal: Positive for back pain.  All other systems reviewed and are negative.  Objective:   Physical Exam  General: Alert and oriented x 3, more comfortable. Large frame/obese  HEENT: Head is normocephalic, atraumatic, PERRLA, EOMI, sclera anicteric, oral mucosa pink and moist, dentition intact, ext ear canals clear,  Neck: Supple without JVD or lymphadenopathy  Heart: Reg rate and rhythm. No murmurs rubs or gallops  Chest: CTA bilaterally without wheezes, rales, or rhonchi; no distress  Abdomen: Soft, non-tender, non-distended, bowel sounds positive.  Extremities: No clubbing, cyanosis, or edema. Pulses are 2+  Skin: Clean and intact without signs of breakdown  Neuro: Pt is cognitively appropriate with normal insight, memory, and awareness. Cranial nerves 2-12 are intact. Sensory exam is normal. Reflexes are 1+ in all 4's. Fine motor coordination is intact. No tremors. Motor function is grossly 5/5 except for pain  inhibition in the left leg.  Musculoskeletal: Posture was better today. Still favors the right a bit. Extension caused more discomfort again today. Facet maneuver was positive left more than right. Lateral bending to the left caused discomfort as well. SLR was equivocal to negative. FABER test was negative.  Pelvis was fairly symmetrical. Gait is antalgic to the left. Left greater troch was very tender which much less pain around the iliac crest. Crossed leg maneuver was provocative of pain.  Psych: Pt's affect is appropriate. Pt is cooperative. He is very pleasant.  Assessment & Plan:   1. Low back pain with severe, left greater than right facet disease at L5-S1. Also with degenerative disk disease and some encroachment upon the S1 nerve root sleeve. His symptoms are most consistent with severe left L5-S1 facet arthritis. He had positive results with at least 50% drop in his pain levels over the first 7 days after the MBB's 2. DM2--?level of control (he does not check sugars)  3. CAD with medicated stent, on plavix  4. Greater trochanteric bursitis (left)- which appears to have flared   Plan:  1. Discussed RF's today. Will refer to Dr. Wynn Moody for Rf's at the L5-S1 levels. 2. Continue with stretching, modalities, ROM, and HEP. Also discussed proper posture and positioning.   3. Will again rx percocet for breakthrough pain 10/325 q8 prn  4. I will see him back in about 3 months. 30 minutes of face to face patient care time were spent during this visit. All questions were encouraged and answered.

## 2013-10-23 ENCOUNTER — Telehealth: Payer: Self-pay | Admitting: Physical Medicine & Rehabilitation

## 2013-10-23 NOTE — Telephone Encounter (Signed)
Please query patient as to why there's no oxycodone in his urine

## 2013-10-24 NOTE — Telephone Encounter (Signed)
Left voicemail for patient to return call to clinic regarding his UDS.

## 2013-10-29 ENCOUNTER — Ambulatory Visit (INDEPENDENT_AMBULATORY_CARE_PROVIDER_SITE_OTHER): Payer: BC Managed Care – PPO | Admitting: Podiatry

## 2013-10-29 ENCOUNTER — Encounter: Payer: Self-pay | Admitting: Podiatry

## 2013-10-29 ENCOUNTER — Telehealth: Payer: Self-pay | Admitting: Physical Medicine & Rehabilitation

## 2013-10-29 VITALS — BP 112/62 | HR 60 | Resp 12 | Ht 75.0 in | Wt 264.0 lb

## 2013-10-29 DIAGNOSIS — M79609 Pain in unspecified limb: Secondary | ICD-10-CM

## 2013-10-29 DIAGNOSIS — B351 Tinea unguium: Secondary | ICD-10-CM

## 2013-10-29 NOTE — Progress Notes (Signed)
N-thick, long  L-b/l toenails D-long-term O-slowly C-longer A-shoes T-trim

## 2013-10-30 NOTE — Progress Notes (Signed)
Subjective:     Patient ID: [name], male   DOB: [birth year], 57 y.o.   MRN: [id number]  HPI patient states he's had diabetes for a while and has nails that are thick and elongated he cannot cut them them himself and his family doctor does not want him attempting due to the severe thickness and pain   Review of Systems  All other systems reviewed and are negative.       Objective:   Physical Exam  Nursing note and vitals reviewed. Constitutional: He is oriented to person, place, and time. He appears well-developed and well-nourished.  Musculoskeletal: Normal range of motion.  Neurological: He is oriented to person, place, and time.  Skin: Skin is dry.   nail disease with thickness 1-5 of both feet that are painful when pressed with neurovascular status found to be mildly diminished and muscle strength adequate     Assessment:     Severe mycotic nail infection with pain 1-5 both feet and at risk diabetic    Plan:     Debridement of painful nails after diabetic education given to patient and H&P performed

## 2013-10-31 ENCOUNTER — Encounter: Payer: BC Managed Care – PPO | Admitting: Physical Medicine & Rehabilitation

## 2013-11-01 ENCOUNTER — Encounter: Payer: Self-pay | Admitting: Internal Medicine

## 2013-11-01 ENCOUNTER — Ambulatory Visit (INDEPENDENT_AMBULATORY_CARE_PROVIDER_SITE_OTHER): Payer: BC Managed Care – PPO | Admitting: Internal Medicine

## 2013-11-01 VITALS — BP 110/72 | HR 73 | Temp 98.8°F | Wt 250.3 lb

## 2013-11-01 DIAGNOSIS — B349 Viral infection, unspecified: Secondary | ICD-10-CM | POA: Insufficient documentation

## 2013-11-01 DIAGNOSIS — B9789 Other viral agents as the cause of diseases classified elsewhere: Secondary | ICD-10-CM

## 2013-11-01 MED ORDER — BENZONATATE 200 MG PO CAPS: 200.0000 mg | ORAL_CAPSULE | Freq: Three times a day (TID) | ORAL | Status: DC | PRN

## 2013-11-01 MED ORDER — ACETAMINOPHEN 500 MG PO TABS: 500.0000 mg | ORAL_TABLET | Freq: Three times a day (TID) | ORAL | Status: DC | PRN

## 2013-11-01 MED ORDER — IBUPROFEN 200 MG PO TABS: 600.0000 mg | ORAL_TABLET | Freq: Three times a day (TID) | ORAL | Status: AC | PRN

## 2013-11-01 NOTE — Progress Notes (Signed)
Patient ID: BRONSON BRESSMAN, male   DOB: 1955-12-15, 57 y.o.   MRN: [id number]  Subjective:   Patient ID: QUINNTON BURY male   DOB: [birth year] 57 y.o.   MRN: [id number]  HPI: Mr.Hari S Stelle is a 57 y.o. M with PMH GERD, chronic back pain, CAD s/p stenting 07/2012, HTN, and DM2 presents for an acute visit.  He presents today c/o fevers up to 100.37F this morning, body aches, decreased appetite, nausea, diarrhea, sore throat, and congestion with a nonproductive cough since Tuesday night. He states that his family went out of town for Thanksgiving and there were some children there who had colds. He states that he took Tylenol last night and this morning at 10am. He endorses decreased appetite and has not been taking his regular medications.   He did receive the flu vaccine on 08/23/13.    Past Medical History  Diagnosis Date  . TOBACCO ABUSE 11/25/2006  . CORONARY ARTERY DISEASE 11/25/2006    Cardiac cath by Dr Sharyn Lull 11/2003 LV showed good LV systolic function, EF of 55-60%. Left main was patent. LAD has 20-30% mid stenosis. Diagonal 1 and diagonal 2 were patent. Left circumflex was patent. OM1 was less than 0.5 mm which was diffusely diseased. OM2 has 20% ostial stenosis which was patent which  was moderate size. OM3 was patent at prior PTCA and stented site. RCA has 20-30% proximal   . Hypertension   . Atrial arrhythmia   . Type II diabetes mellitus   . Cellulitis of knee, right 06/06/12  . Chronic otitis externa 06/06/12    C Multiple trials with antibiocs and antifungal. Cultures were positive for Pseudomonas. Follow up with ENT on 06/14/12    . NSTEMI (non-ST elevated myocardial infarction) 08/14/2012    S/p Successful PTCA to proximal OM-3      Current Outpatient Prescriptions  Medication Sig Dispense Refill  . aspirin EC 81 MG tablet Take 1 tablet (81 mg total) by mouth daily.  90 tablet  3  . Blood Glucose Monitoring Suppl (ONE TOUCH ULTRA MINI) W/DEVICE KIT Check blood sugar 3x a day dx code  250.00  300 each  3  . glipiZIDE (GLUCOTROL) 10 MG tablet Take 1 tablet (10 mg total) by mouth daily.  90 tablet  3  . glucose blood (ONE TOUCH ULTRA TEST) test strip Check blood sugar 3x a day dx code 250.00  300 each  3  . isosorbide mononitrate (IMDUR) 30 MG 24 hr tablet Take 1 tablet (30 mg total) by mouth daily.  90 tablet  3  . lisinopril (PRINIVIL,ZESTRIL) 5 MG tablet Take 1 tablet (5 mg total) by mouth daily.  90 tablet  3  . metFORMIN (GLUCOPHAGE) 1000 MG tablet Take 1 tablet (1,000 mg total) by mouth 2 (two) times daily with a meal.  180 tablet  3  . ONETOUCH DELICA LANCETS FINE MISC 1 each by Does not apply route 3 (three) times daily. Check blood sugar 3x a day dx code 250.00  300 each  3  . oxyCODONE-acetaminophen (PERCOCET) 10-325 MG per tablet Take 1 tablet by mouth every 8 (eight) hours as needed for pain.  60 tablet  0  . pravastatin (PRAVACHOL) 80 MG tablet Take 1 tablet (80 mg total) by mouth daily.  90 tablet  3  . sotalol (BETAPACE) 80 MG tablet Take 0.5 tablets (40 mg total) by mouth 2 (two) times daily. Initially prescribed by Dr Sharyn Lull  180 tablet  3   No  current facility-administered medications for this visit.   No family history on file. History   Social History  . Marital Status: Married    Spouse Name: N/A    Number of Children: N/A  . Years of Education: N/A   Social History Main Topics  . Smoking status: Former Smoker -- 0.50 packs/day for 12 years    Types: Cigarettes    Quit date: 07/26/1979  . Smokeless tobacco: Never Used  . Alcohol Use: No  . Drug Use: No  . Sexual Activity: Yes   Other Topics Concern  . None   Social History Narrative  . None   Review of Systems: Constitutional: +fever, chills, appetite change and fatigue.  HEENT: +sore throat and nasal congestion Respiratory: +dry cough and pain with coughing Cardiovascular: Denies palpitations or leg swelling.  Gastrointestinal: +nausea and diarrhea. Denies vomiting or  constipation. Genitourinary: Denies dysuria or urgency Musculoskeletal: +myalgias Skin: Denies rash   Neurological:+weakness. Denies numbness or syncope. Psychiatric/Behavioral: Denies suicidal ideation, mood changes  Objective:  Physical Exam: Filed Vitals:   11/01/13 1353  BP: 110/72  Pulse: 73  Temp: 98.8 F (37.1 C)  TempSrc: Oral  Weight: 250 lb 4.8 oz (113.535 kg)  SpO2: 97%   Constitutional: Vital signs reviewed.  Patient is a well-developed and well-nourished male who appears to be in moderate distress.   Head: Normocephalic and atraumatic Ear: TM normal bilaterally Nose: +erythema with edematous turbinates Mouth: Erythema to posterior oropharynx. No exudates appreciated. MMM Eyes: PERRL, EOMI, conjunctivae normal, No scleral icterus.  Neck: Supple, Trachea midline  Cardiovascular: RRR, no MRG Pulmonary/Chest: Normal respiratory effort, dry cough. CTAB, no wheezes, rales, or rhonchi Abdominal: Soft. Non-tender, non-distended Musculoskeletal: Moves all 4 extremities. Neurological: A&O x3, non focal Skin: Warm, dry and intact.  Psychiatric: Pt appears to feel very ill, not his normal self.    Assessment & Plan:   Please refer to Problem List based Assessment and Plan

## 2013-11-01 NOTE — Patient Instructions (Signed)
Please call the clinic tomorrow to let me know how you are feeling. The number is [phone]. You can leave a message for the nurses on the triage line.   Stay hydrated. You can take 1 Tylenol 3 times a day (every 8 hours) as needed for fever. You can take Ibuprofen 600mg  (3 tablets) every 6-8 hours as needed for body aches if the Tylenol does not help.   Feel better!!!

## 2013-11-01 NOTE — Assessment & Plan Note (Addendum)
Pt with almost 2 days of decreased appetite, body aches, nausea, diarrhea, sore throat, and congestion with a nonproductive cough. He has had a low grade fewer of 100.6 off Tylenol. With his low grade fever in addition to his GI symptoms, and because he did receive the flu vaccine this season, my suspicion for influenza viral infection is lower. For today and tonight, I want to treat him symptomatically with Tylenol q8h- this frequency was chosen because he does take percocet q8h for chronic pain. He is to stay hydrated with primarily water and broth and was told to avoid a lot of juice as it would raise his blood sugar. However, I have asked his wife to call me in the morning, and if [name] is still not feeling well, I will call in Oseltamivir for him to help shorten the duration of his illness.

## 2013-11-02 NOTE — Progress Notes (Signed)
Case discussed with Dr. Glenn at the time of the visit.  We reviewed the resident's history and exam and pertinent patient test results.  I agree with the assessment, diagnosis, and plan of care documented in the resident's note.   

## 2013-11-13 ENCOUNTER — Ambulatory Visit (HOSPITAL_BASED_OUTPATIENT_CLINIC_OR_DEPARTMENT_OTHER): Payer: BC Managed Care – PPO | Admitting: Physical Medicine & Rehabilitation

## 2013-11-13 ENCOUNTER — Encounter: Payer: Self-pay | Admitting: Physical Medicine & Rehabilitation

## 2013-11-13 ENCOUNTER — Encounter: Payer: BC Managed Care – PPO | Attending: Physical Medicine & Rehabilitation

## 2013-11-13 VITALS — BP 118/64 | HR 69 | Resp 14 | Ht 76.0 in | Wt 250.0 lb

## 2013-11-13 DIAGNOSIS — M7062 Trochanteric bursitis, left hip: Secondary | ICD-10-CM

## 2013-11-13 DIAGNOSIS — M47817 Spondylosis without myelopathy or radiculopathy, lumbosacral region: Secondary | ICD-10-CM

## 2013-11-13 DIAGNOSIS — Z79899 Other long term (current) drug therapy: Secondary | ICD-10-CM | POA: Insufficient documentation

## 2013-11-13 DIAGNOSIS — M76899 Other specified enthesopathies of unspecified lower limb, excluding foot: Secondary | ICD-10-CM | POA: Insufficient documentation

## 2013-11-13 DIAGNOSIS — M47816 Spondylosis without myelopathy or radiculopathy, lumbar region: Secondary | ICD-10-CM

## 2013-11-13 DIAGNOSIS — Z5181 Encounter for therapeutic drug level monitoring: Secondary | ICD-10-CM | POA: Insufficient documentation

## 2013-11-13 MED ORDER — OXYCODONE-ACETAMINOPHEN 10-325 MG PO TABS: 1.0000 | ORAL_TABLET | Freq: Three times a day (TID) | ORAL | Status: DC | PRN

## 2013-11-13 NOTE — Patient Instructions (Signed)

## 2013-11-13 NOTE — Progress Notes (Signed)
  PROCEDURE RECORD [facility]   Name: [name] DOB:[birth year] MRN: [id number]  Date:11/13/2013  Physician: Claudette Laws, MD    Nurse/CMA: Kelli Churn, CMA  Allergies: Not on File  Consent Signed: yes  Is patient diabetic? yes  CBG today? 148  Pregnant: no LMP: No LMP for male patient. (age 56-55)  Anticoagulants: no Anti-inflammatory: no Antibiotics: no  Procedure: Medial branch block  Position: Prone Start Time: 202  End Time: 212  Fluoro Time: 26  RN/CMA Samira Acero,CMA Estes Lehner, CMA    Time 133 215    BP 118/68 123/63    Pulse 69 73    Respirations 14 14    O2 Sat 98 98    S/S 6 6    Pain Level 6/10 0/10     D/C home with Neighbor, patient A & O X 3, D/C instructions reviewed, and sits independently.

## 2013-11-13 NOTE — Progress Notes (Signed)
Bilateral Lumbar  L4  medial branch blocks and L 5 dorsal ramus injection under fluoroscopic guidance  Indication: Lumbar pain which is not relieved by medication management or other conservative care and interfering with self-care and mobility.  Informed consent was obtained after describing risks and benefits of the procedure with the patient, this includes bleeding, infection, paralysis and medication side effects.  The patient wishes to proceed and has given written consent.  The patient was placed in prone position.  The lumbar area was marked and prepped with Betadine.  One mL of 1% lidocaine was injected into each of 6 areas into the skin and subcutaneous tissue.  Then a 22-gauge 3.5 inch spinal needle was inserted targeting the junction of the left S1 superior articular process and sacral ala junction. Needle was advanced under fluoroscopic guidance.  Bone contact was made.  Omnipaque 180 was injected x 0.5 mL demonstrating no intravascular uptake.  Then a solution containing one mL of 4 mg per mL dexamethasone and 3 mL of 2% MPF lidocaine was injected x 0.5 mL.  Then the left L5 superior articular process in transverse process junction was targeted.  Bone contact was made.  Omnipaque 180 was injected x 0.5 mL demonstrating no intravascular uptake. Then a solution containing one mL of 4 mg per mL dexamethasone and 3 mL of 2% MPF lidocaine was injected x 0.5 mL.   This same procedure was performed on the right side using the same needle, technique and injectate.  Patient tolerated procedure well.  Post procedure instructions were given.  Reinjection pain level 6    Post injection pain level

## 2013-12-13 ENCOUNTER — Encounter: Payer: Self-pay | Admitting: Internal Medicine

## 2013-12-13 DIAGNOSIS — Z0000 Encounter for general adult medical examination without abnormal findings: Secondary | ICD-10-CM | POA: Insufficient documentation

## 2013-12-19 ENCOUNTER — Encounter: Payer: Self-pay | Admitting: Physical Medicine & Rehabilitation

## 2013-12-19 ENCOUNTER — Encounter
Payer: No Typology Code available for payment source | Attending: Physical Medicine & Rehabilitation | Admitting: Physical Medicine & Rehabilitation

## 2013-12-19 VITALS — BP 114/59 | HR 80 | Resp 14 | Ht 75.0 in | Wt 260.0 lb

## 2013-12-19 DIAGNOSIS — M47816 Spondylosis without myelopathy or radiculopathy, lumbar region: Secondary | ICD-10-CM

## 2013-12-19 DIAGNOSIS — M47817 Spondylosis without myelopathy or radiculopathy, lumbosacral region: Secondary | ICD-10-CM | POA: Insufficient documentation

## 2013-12-19 DIAGNOSIS — M76899 Other specified enthesopathies of unspecified lower limb, excluding foot: Secondary | ICD-10-CM | POA: Insufficient documentation

## 2013-12-19 DIAGNOSIS — M7062 Trochanteric bursitis, left hip: Secondary | ICD-10-CM

## 2013-12-19 MED ORDER — MORPHINE SULFATE ER 30 MG PO TBCR: 30.0000 mg | EXTENDED_RELEASE_TABLET | Freq: Two times a day (BID) | ORAL | Status: DC

## 2013-12-19 MED ORDER — OXYCODONE-ACETAMINOPHEN 10-325 MG PO TABS: 1.0000 | ORAL_TABLET | Freq: Three times a day (TID) | ORAL | Status: DC | PRN

## 2013-12-19 MED ORDER — METHOCARBAMOL 500 MG PO TABS: 500.0000 mg | ORAL_TABLET | Freq: Four times a day (QID) | ORAL | Status: DC | PRN

## 2013-12-19 NOTE — Progress Notes (Signed)
Subjective:    Patient ID: [name], male    DOB: Mar 29, 1956, 58 y.o.   MRN: [id number]  HPI  [name] is back regarding his chronic back pain. The last set of MBB"s provided little relief compared to the initial set. Over the last couple weeks his pain has increased even more. It is primarily in his low back with some radiation into his hips and anterolateral thigh. He denies tingling or numbness/pain in lower leg or foot. He has a hard time standing for any period of time and his back becomes very stiff he sits for more than a few minutes.  He is using percocet for breakthrough pain which doesn't provide a great deal of relief at this point.    Pain Inventory Average Pain 7 Pain Right Now 7 My pain is aching  In the last 24 hours, has pain interfered with the following? General activity 4 Relation with others 0 Enjoyment of life 4 What TIME of day is your pain at its worst? night Sleep (in general) Poor  Pain is worse with: other Pain improves with: medication and other Relief from Meds: 4  Mobility walk without assistance how many minutes can you walk? 30 ability to climb steps?  yes do you drive?  yes transfers alone Do you have any goals in this area?  yes  Function employed # of hrs/week 30  Neuro/Psych No problems in this area  Prior Studies Any changes since last visit?  no  Physicians involved in your care Any changes since last visit?  no   History reviewed. No pertinent family history. History   Social History  . Marital Status: Married    Spouse Name: N/A    Number of Children: N/A  . Years of Education: N/A   Social History Main Topics  . Smoking status: Former Smoker -- 0.50 packs/day for 12 years    Types: Cigarettes    Quit date: 07/26/1979  . Smokeless tobacco: Never Used  . Alcohol Use: No  . Drug Use: No  . Sexual Activity: Yes   Other Topics Concern  . None   Social History Narrative  . None   Past Surgical History    Procedure Laterality Date  . Coronary angioplasty with stent placement  ~ 2005    "1"  . Cystectomy  1990's    LUA   Past Medical History  Diagnosis Date  . TOBACCO ABUSE 11/25/2006  . CORONARY ARTERY DISEASE 11/25/2006    Cardiac cath by Dr Terrence Dupont 11/2003 LV showed good LV systolic function, EF of 96-78%. Left main was patent. LAD has 20-30% mid stenosis. Diagonal 1 and diagonal 2 were patent. Left circumflex was patent. OM1 was less than 0.5 mm which was diffusely diseased. OM2 has 20% ostial stenosis which was patent which  was moderate size. OM3 was patent at prior PTCA and stented site. RCA has 20-30% proximal   . Hypertension   . Atrial arrhythmia   . Type II diabetes mellitus   . Cellulitis of knee, right 06/06/12  . Chronic otitis externa 06/06/12    C Multiple trials with antibiocs and antifungal. Cultures were positive for Pseudomonas. Follow up with ENT on 06/14/12    . NSTEMI (non-ST elevated myocardial infarction) 08/14/2012    S/p Successful PTCA to proximal OM-3      BP 114/59  Pulse 80  Resp 14  Ht 6\' 3"  (1.905 m)  Wt 260 lb (117.935 kg)  BMI 32.50 kg/m2  SpO2  98%      Review of Systems  Constitutional: Positive for unexpected weight change.  Musculoskeletal: Positive for back pain.  All other systems reviewed and are negative.       Objective:   Physical Exam  General: Alert and oriented x 3, more comfortable. Large frame/obese  HEENT: Head is normocephalic, atraumatic, PERRLA, EOMI, sclera anicteric, oral mucosa pink and moist, dentition intact, ext ear canals clear,  Neck: Supple without JVD or lymphadenopathy  Heart: Reg rate and rhythm. No murmurs rubs or gallops  Chest: CTA bilaterally without wheezes, rales, or rhonchi; no distress  Abdomen: Soft, non-tender, non-distended, bowel sounds positive.  Extremities: No clubbing, cyanosis, or edema. Pulses are 2+  Skin: Clean and intact without signs of breakdown  Neuro: Pt is cognitively appropriate  with normal insight, memory, and awareness. Cranial nerves 2-12 are intact. Sensory exam is normal. Reflexes are 1+ in all 4's. Fine motor coordination is intact. No tremors. Motor function is grossly 5/5 except for pain inhibition in the left leg.  Musculoskeletal:  Extension causes most of his discomfort. He has trouble standing after sitting. . Facet maneuver was positive left greater than right. Lateral bending to the left caused discomfort as well. SLR was equivocal to negative. FABER test was negative. Pelvis was fairly symmetrical. Gait is antalgic to the left. Left greater troch was very tender which much less pain around the iliac crest. Crossed leg maneuver was provocative of pain.  Psych: Pt's affect is appropriate. Pt is cooperative. He is very pleasant.  Assessment & Plan:   1. Low back pain with severe, left greater than right facet disease at L5-S1. Also with degenerative disk disease and some encroachment upon the S1 nerve root sleeve. His symptoms are still most consistent with severe left L5-S1 facet arthritis. The last MBB"s in mid December were not helpful. 2. DM2--?level of control (he does not check sugars)  3. CAD with medicated stent, on plavix  4. Greater trochanteric bursitis (left)- may be overlapping referral pattern from back as well.   Plan:  1. Consider ESI at L5-S1. Will discuss with Dr. Letta Pate about potential efficacy of potential injection..  2. I would like him to see neurosurgery for surgical opinion/options. Will make a referral to Dr. Sherley Bounds. 3. Will again rx percocet for breakthrough pain 10/325 q8 prn. Will also add MS Contin 30mg  CR for better pain control in the meantime. 4. Robaxin for muscle spasm, 500mg  q6 prn 5..I will see him back pending plan above.  20 minutes of face to face patient care time were spent during this visit. All questions were encouraged and answered.

## 2013-12-19 NOTE — Patient Instructions (Signed)
I WILL CALL YOU REGARDING OPTIONS MOVING FORWARD.

## 2014-01-10 ENCOUNTER — Encounter: Payer: Self-pay | Admitting: Internal Medicine

## 2014-01-10 ENCOUNTER — Ambulatory Visit (INDEPENDENT_AMBULATORY_CARE_PROVIDER_SITE_OTHER): Payer: No Typology Code available for payment source | Admitting: Internal Medicine

## 2014-01-10 VITALS — BP 116/74 | HR 72 | Temp 98.3°F | Ht 75.0 in

## 2014-01-10 DIAGNOSIS — E119 Type 2 diabetes mellitus without complications: Secondary | ICD-10-CM

## 2014-01-10 DIAGNOSIS — I10 Essential (primary) hypertension: Secondary | ICD-10-CM

## 2014-01-10 LAB — POCT GLYCOSYLATED HEMOGLOBIN (HGB A1C): Hemoglobin A1C: 6.4

## 2014-01-10 LAB — GLUCOSE, CAPILLARY: Glucose-Capillary: 99 mg/dL (ref 70–99)

## 2014-01-10 NOTE — Progress Notes (Signed)
Patient ID: [name], male   DOB: [birth year], 58 y.o.   MRN: [id number]  Subjective:   Patient ID: [name] male   DOB: 1956-05-10 58 y.o.   MRN: [id number]  HPI: [name] is a 58 y.o. M w/ PMH GERD, chronic back pain, CAD s/p stenting 07/2012, HTN, and DM2 presents for a DM f/u  Pt states that he saw a Neurosurgeon, Dr. Ronnald Ramp, for his back pain. An MRI is planned for further evaluation. Mr. Musial continues to have significant back pain despite multiple steroid injections.   He endorsescompliance with all his medications. Pt states that he is not checking his blood sugars at home but denies any dizziness, vision changes, syncope, changes sensation in his hands or feet, or changes in urination. He denies N/V/diarhhea/constipation.   Past Medical History  Diagnosis Date  . TOBACCO ABUSE 11/25/2006  . CORONARY ARTERY DISEASE 11/25/2006    Cardiac cath by Dr Terrence Dupont 11/2003 LV showed good LV systolic function, EF of 17-35%. Left main was patent. LAD has 20-30% mid stenosis. Diagonal 1 and diagonal 2 were patent. Left circumflex was patent. OM1 was less than 0.5 mm which was diffusely diseased. OM2 has 20% ostial stenosis which was patent which  was moderate size. OM3 was patent at prior PTCA and stented site. RCA has 20-30% proximal   . Hypertension   . Atrial arrhythmia   . Type II diabetes mellitus   . Cellulitis of knee, right 06/06/12  . Chronic otitis externa 06/06/12    C Multiple trials with antibiocs and antifungal. Cultures were positive for Pseudomonas. Follow up with ENT on 06/14/12    . NSTEMI (non-ST elevated myocardial infarction) 08/14/2012    S/p Successful PTCA to proximal OM-3      Current Outpatient Prescriptions  Medication Sig Dispense Refill  . acetaminophen (TYLENOL) 500 MG tablet Take 1 tablet (500 mg total) by mouth every 8 (eight) hours as needed for fever.  30 tablet  0  . aspirin EC 81 MG tablet Take 1 tablet (81 mg total) by mouth daily.  90 tablet  3  .  benzonatate (TESSALON) 200 MG capsule Take 1 capsule (200 mg total) by mouth 3 (three) times daily as needed for cough.  20 capsule  0  . Blood Glucose Monitoring Suppl (ONE TOUCH ULTRA MINI) W/DEVICE KIT Check blood sugar 3x a day dx code 250.00  300 each  3  . glipiZIDE (GLUCOTROL) 10 MG tablet Take 1 tablet (10 mg total) by mouth daily.  90 tablet  3  . glucose blood (ONE TOUCH ULTRA TEST) test strip Check blood sugar 3x a day dx code 250.00  300 each  3  . ibuprofen (ADVIL) 200 MG tablet Take 3 tablets (600 mg total) by mouth every 8 (eight) hours as needed for moderate pain.  100 tablet  2  . isosorbide mononitrate (IMDUR) 30 MG 24 hr tablet Take 1 tablet (30 mg total) by mouth daily.  90 tablet  3  . lisinopril (PRINIVIL,ZESTRIL) 5 MG tablet Take 1 tablet (5 mg total) by mouth daily.  90 tablet  3  . metFORMIN (GLUCOPHAGE) 1000 MG tablet Take 1 tablet (1,000 mg total) by mouth 2 (two) times daily with a meal.  180 tablet  3  . methocarbamol (ROBAXIN) 500 MG tablet Take 1 tablet (500 mg total) by mouth every 6 (six) hours as needed for muscle spasms.  90 tablet  3  . morphine (MS CONTIN) 30 MG  12 hr tablet Take 1 tablet (30 mg total) by mouth every 12 (twelve) hours.  60 tablet  0  . NITROSTAT 0.4 MG SL tablet       . ONETOUCH DELICA LANCETS FINE MISC 1 each by Does not apply route 3 (three) times daily. Check blood sugar 3x a day dx code 250.00  300 each  3  . oxyCODONE-acetaminophen (PERCOCET) 10-325 MG per tablet Take 1 tablet by mouth every 8 (eight) hours as needed for pain.  60 tablet  0  . pravastatin (PRAVACHOL) 80 MG tablet Take 1 tablet (80 mg total) by mouth daily.  90 tablet  3  . selenium sulfide (SELSUN) 2.5 % shampoo       . sotalol (BETAPACE) 80 MG tablet Take 0.5 tablets (40 mg total) by mouth 2 (two) times daily. Initially prescribed by Dr Terrence Dupont  180 tablet  3   No current facility-administered medications for this visit.   No family history on file. History   Social  History  . Marital Status: Married    Spouse Name: N/A    Number of Children: N/A  . Years of Education: N/A   Social History Main Topics  . Smoking status: Former Smoker -- 0.50 packs/day for 12 years    Types: Cigarettes    Quit date: 07/26/1979  . Smokeless tobacco: Never Used  . Alcohol Use: No  . Drug Use: No  . Sexual Activity: Yes   Other Topics Concern  . None   Social History Narrative  . None   Review of Systems: A 12 point ROS was performed; pertinent positives and negatives were noted in the HPI  Objective:  Physical Exam: Filed Vitals:   01/10/14 1552  BP: 116/74  Pulse: 72  Temp: 98.3 F (36.8 C)  TempSrc: Oral  Height: _0  (1.905 m)  SpO2: 98%   Constitutional: Vital signs reviewed.  Patient is a well-developed and well-nourished male in no acute distress and cooperative with exam.  Head: Normocephalic and atraumatic Eyes: PERRL, EOMI, conjunctivae normal, No scleral icterus.  Cardiovascular: RRR, no MRG, pulses symmetric and intact bilaterally Pulmonary/Chest: Normal respiratory effort, CTAB Abdominal: Soft. Non-tender, non-distended, bowel sounds are normal,  Musculoskeletal: Moves all 4 extremities. Significant lumbar spinal pain. Neurological: A&O x3, Strength is normal and symmetric in bilateral upper and lower extremities, cranial nerve II-XII are grossly intact Skin: Warm, dry and intact.  Psychiatric: Normal mood and affect. Speech and behavior is normal.   Assessment & Plan:   Please refer to Problem List based Assessment and Plan

## 2014-01-10 NOTE — Patient Instructions (Signed)
Keep up the great work!

## 2014-01-11 NOTE — Assessment & Plan Note (Addendum)
Lab Results  Component Value Date   HGBA1C 6.4 01/10/2014   HGBA1C 6.6 10/04/2013   HGBA1C 7.0 06/28/2013     Assessment: Diabetes control: good control (HgbA1C at goal) Progress toward A1C goal:  at goal Comments: Pt is currently taking glipizide 10 mg daily and metformin 1000 mg twice a day  Plan: Medications:  continue current medications Home glucose monitoring: Frequency:  (No home monitoring) Timing:   Instruction/counseling given: reminded to bring medications to each visit Educational resources provided: brochure;handout Self management tools provided:   Other plans: F/u in 3 months

## 2014-01-11 NOTE — Assessment & Plan Note (Signed)
BP Readings from Last 3 Encounters:  01/10/14 116/74  12/19/13 114/59  11/13/13 118/64    Lab Results  Component Value Date   NA 139 05/10/2013   K 4.3 05/10/2013   CREATININE 0.65 05/10/2013    Assessment: Blood pressure control: controlled Progress toward BP goal:  at goal   Plan: Medications:  continue current medications Educational resources provided: brochure;handout;video

## 2014-01-14 NOTE — Progress Notes (Signed)
Case discussed with Dr. Glenn at the time of the visit. We reviewed the resident's history and exam and pertinent patient test results. I agree with the assessment, diagnosis and plan of care documented in the resident's note. 

## 2014-01-28 ENCOUNTER — Ambulatory Visit: Payer: BC Managed Care – PPO | Admitting: Podiatry

## 2014-01-29 ENCOUNTER — Telehealth: Payer: Self-pay

## 2014-01-29 DIAGNOSIS — M7062 Trochanteric bursitis, left hip: Secondary | ICD-10-CM

## 2014-01-29 DIAGNOSIS — M47817 Spondylosis without myelopathy or radiculopathy, lumbosacral region: Secondary | ICD-10-CM

## 2014-01-29 DIAGNOSIS — M47816 Spondylosis without myelopathy or radiculopathy, lumbar region: Secondary | ICD-10-CM

## 2014-01-29 MED ORDER — MORPHINE SULFATE ER 30 MG PO TBCR: 30.0000 mg | EXTENDED_RELEASE_TABLET | Freq: Two times a day (BID) | ORAL | Status: DC

## 2014-01-29 NOTE — Telephone Encounter (Signed)
Morphine rx printed.  Left message advising patient rx is ready for pick up.  Please schedule translaminar ESI.

## 2014-01-29 NOTE — Telephone Encounter (Signed)
Can refill morphine. Can refer for L5-S1 translaminar ESI----will he have coverage for this?

## 2014-01-29 NOTE — Telephone Encounter (Signed)
Patient is requesting a refill on his Morphine. Patient does not have a appt scheduled, and note does not indicate plan for patient. Is this okay to refill?

## 2014-02-05 ENCOUNTER — Other Ambulatory Visit: Payer: Self-pay | Admitting: Neurological Surgery

## 2014-02-14 ENCOUNTER — Ambulatory Visit (INDEPENDENT_AMBULATORY_CARE_PROVIDER_SITE_OTHER): Payer: No Typology Code available for payment source | Admitting: Podiatry

## 2014-02-14 ENCOUNTER — Encounter: Payer: Self-pay | Admitting: Podiatry

## 2014-02-14 VITALS — BP 125/72 | HR 62 | Resp 16

## 2014-02-14 DIAGNOSIS — B351 Tinea unguium: Secondary | ICD-10-CM

## 2014-02-14 DIAGNOSIS — M79609 Pain in unspecified limb: Secondary | ICD-10-CM

## 2014-02-14 NOTE — Patient Instructions (Signed)
Diabetes and Foot Care Diabetes may cause you to have problems because of poor blood supply (circulation) to your feet and legs. This may cause the skin on your feet to become thinner, break easier, and heal more slowly. Your skin may become dry, and the skin may peel and crack. You may also have nerve damage in your legs and feet causing decreased feeling in them. You may not notice minor injuries to your feet that could lead to infections or more serious problems. Taking care of your feet is one of the most important things you can do for yourself.  HOME CARE INSTRUCTIONS  Wear shoes at all times, even in the house. Do not go barefoot. Bare feet are easily injured.  Check your feet daily for blisters, cuts, and redness. If you cannot see the bottom of your feet, use a mirror or ask someone for help.  Wash your feet with warm water (do not use hot water) and mild soap. Then pat your feet and the areas between your toes until they are completely dry. Do not soak your feet as this can dry your skin.  Apply a moisturizing lotion or petroleum jelly (that does not contain alcohol and is unscented) to the skin on your feet and to dry, brittle toenails. Do not apply lotion between your toes.  Trim your toenails straight across. Do not dig under them or around the cuticle. File the edges of your nails with an emery board or nail file.  Do not cut corns or calluses or try to remove them with medicine.  Wear clean socks or stockings every day. Make sure they are not too tight. Do not wear knee-high stockings since they may decrease blood flow to your legs.  Wear shoes that fit properly and have enough cushioning. To break in new shoes, wear them for just a few hours a day. This prevents you from injuring your feet. Always look in your shoes before you put them on to be sure there are no objects inside.  Do not cross your legs. This may decrease the blood flow to your feet.  If you find a minor scrape,  cut, or break in the skin on your feet, keep it and the skin around it clean and dry. These areas may be cleansed with mild soap and water. Do not cleanse the area with peroxide, alcohol, or iodine.  When you remove an adhesive bandage, be sure not to damage the skin around it.  If you have a wound, look at it several times a day to make sure it is healing.  Do not use heating pads or hot water bottles. They may burn your skin. If you have lost feeling in your feet or legs, you may not know it is happening until it is too late.  Make sure your health care provider performs a complete foot exam at least annually or more often if you have foot problems. Report any cuts, sores, or bruises to your health care provider immediately. SEEK MEDICAL CARE IF:   You have an injury that is not healing.  You have cuts or breaks in the skin.  You have an ingrown nail.  You notice redness on your legs or feet.  You feel burning or tingling in your legs or feet.  You have pain or cramps in your legs and feet.  Your legs or feet are numb.  Your feet always feel cold. SEEK IMMEDIATE MEDICAL CARE IF:   There is increasing redness,   swelling, or pain in or around a wound.  There is a red line that goes up your leg.  Pus is coming from a wound.  You develop a fever or as directed by your health care provider.  You notice a bad smell coming from an ulcer or wound. Document Released: 11/12/2000 Document Revised: 07/18/2013 Document Reviewed: 04/24/2013 [employer] Patient Information 2014 [employer].  

## 2014-02-15 NOTE — Progress Notes (Signed)
Subjective:     Patient ID: [name], male   DOB: [birth year], 59 y.o.   MRN: [id number]  HPI patient is found to have nail disease and thickness 1-5 both feet that he cannot take care of himself   Review of Systems     Objective:   Physical Exam Neurovascular status intact with thick painful nail bed 1-5 both feet    Assessment:     Mycotic nail infection with pain 1-5 both feet    Plan:     Debridement of nailbeds 1-5 both feet with no bleeding noted

## 2014-02-28 ENCOUNTER — Ambulatory Visit: Payer: No Typology Code available for payment source | Admitting: Physical Medicine & Rehabilitation

## 2014-03-05 ENCOUNTER — Other Ambulatory Visit (HOSPITAL_COMMUNITY): Payer: No Typology Code available for payment source

## 2014-03-14 ENCOUNTER — Ambulatory Visit (HOSPITAL_BASED_OUTPATIENT_CLINIC_OR_DEPARTMENT_OTHER): Payer: No Typology Code available for payment source | Admitting: Physical Medicine & Rehabilitation

## 2014-03-14 ENCOUNTER — Encounter: Payer: No Typology Code available for payment source | Attending: Physical Medicine & Rehabilitation

## 2014-03-14 ENCOUNTER — Encounter: Payer: Self-pay | Admitting: Physical Medicine & Rehabilitation

## 2014-03-14 VITALS — BP 150/81 | HR 66 | Resp 14 | Ht 75.0 in | Wt 269.6 lb

## 2014-03-14 DIAGNOSIS — M76899 Other specified enthesopathies of unspecified lower limb, excluding foot: Secondary | ICD-10-CM | POA: Insufficient documentation

## 2014-03-14 DIAGNOSIS — M47816 Spondylosis without myelopathy or radiculopathy, lumbar region: Secondary | ICD-10-CM

## 2014-03-14 DIAGNOSIS — M5416 Radiculopathy, lumbar region: Secondary | ICD-10-CM

## 2014-03-14 DIAGNOSIS — M47817 Spondylosis without myelopathy or radiculopathy, lumbosacral region: Secondary | ICD-10-CM | POA: Insufficient documentation

## 2014-03-14 DIAGNOSIS — M7062 Trochanteric bursitis, left hip: Secondary | ICD-10-CM

## 2014-03-14 DIAGNOSIS — IMO0002 Reserved for concepts with insufficient information to code with codable children: Secondary | ICD-10-CM

## 2014-03-14 DIAGNOSIS — G8929 Other chronic pain: Secondary | ICD-10-CM

## 2014-03-14 MED ORDER — OXYCODONE-ACETAMINOPHEN 10-325 MG PO TABS: 1.0000 | ORAL_TABLET | Freq: Three times a day (TID) | ORAL | Status: DC | PRN

## 2014-03-14 MED ORDER — MORPHINE SULFATE ER 30 MG PO TBCR: 30.0000 mg | EXTENDED_RELEASE_TABLET | Freq: Two times a day (BID) | ORAL | Status: DC

## 2014-03-14 NOTE — Progress Notes (Signed)
  PROCEDURE RECORD [facility]   Name: [name] DOB:July 31, 1956 MRN: [id number]  Date:03/14/2014  Physician: Alysia Penna, MD    Nurse/CMA: Waldo Laine, CMA  Allergies: No Known Allergies  Consent Signed: yes  Is patient diabetic? no  CBG today?   Pregnant: no LMP: No LMP for male patient. (age 57-55)  Anticoagulants: no Anti-inflammatory: no Antibiotics: no  Procedure: L5-S1 Transforaminal Lumbar Epidural Steroid Injection Position: Prone Start Time: 9:54  End Time: 9:59 Fluoro Time: 19 seconds  RN/CMA Shumaker RN [place] CMA    Time 9:21 10:02    BP 150/81 126/70    Pulse 66 63    Respirations 14 14    O2 Sat 97 98    S/S 6 6    Pain Level 7/10 2/10     D/C home with friend, patient A & O X 3, D/C instructions reviewed, and sits independently.

## 2014-03-14 NOTE — Patient Instructions (Signed)

## 2014-03-14 NOTE — Progress Notes (Signed)
Lumbar epidural steroid injection under fluoroscopic guidance Right L5-S1 paramedian  Indication: Lumbosacral radiculitis is not relieved by medication management or other conservative care and interfering with self-care and mobility.  Informed consent was obtained after describing risk and benefits of the procedure with the patient, this includes bleeding, bruising, infection, paralysis and medication side effects.  The patient wishes to proceed and has given written consent.  Patient was placed in a prone position.  The lumbar area was marked and prepped with Betadine.  It was entered with a 25-gauge 1-1/2 inch needle and one mL of 1% lidocaine was injected into the skin and subcutaneous tissue.  Then a 17-gauge spinal needle was inserted under fluoroscopic guidance into the L5-S1 interlaminar space under AP and Lateral imaging.  Once needle tip of approximated the posterior elements, a loss of resistance technique was utilized with lateral imaging.  A positive loss of resistance was obtained and then confirmed by injecting 2 mL's of Omnipaque 180.  Then a solution containing 1.5 mL's of 40 mg per mL depomedrol and 1.5 mL's of 1% lidocaine was injected.  The patient tolerated procedure well.  Post procedure instructions were given.  Please see post procedure form.

## 2014-03-15 ENCOUNTER — Inpatient Hospital Stay (HOSPITAL_COMMUNITY)
Admission: RE | Admit: 2014-03-15 | Payer: No Typology Code available for payment source | Source: Ambulatory Visit | Admitting: Neurological Surgery

## 2014-03-15 ENCOUNTER — Encounter (HOSPITAL_COMMUNITY): Admission: RE | Payer: Self-pay | Source: Ambulatory Visit

## 2014-03-15 SURGERY — FOR MAXIMUM ACCESS (MAS) POSTERIOR LUMBAR INTERBODY FUSION (PLIF) 1 LEVEL
Anesthesia: General | Site: Back

## 2014-03-21 ENCOUNTER — Encounter: Payer: Self-pay | Admitting: Internal Medicine

## 2014-03-21 ENCOUNTER — Telehealth: Payer: Self-pay | Admitting: *Deleted

## 2014-03-21 ENCOUNTER — Ambulatory Visit (INDEPENDENT_AMBULATORY_CARE_PROVIDER_SITE_OTHER): Payer: No Typology Code available for payment source | Admitting: Internal Medicine

## 2014-03-21 VITALS — BP 132/83 | HR 88 | Temp 98.6°F | Wt 265.5 lb

## 2014-03-21 DIAGNOSIS — E119 Type 2 diabetes mellitus without complications: Secondary | ICD-10-CM

## 2014-03-21 DIAGNOSIS — L02419 Cutaneous abscess of limb, unspecified: Secondary | ICD-10-CM

## 2014-03-21 DIAGNOSIS — L03119 Cellulitis of unspecified part of limb: Secondary | ICD-10-CM

## 2014-03-21 DIAGNOSIS — L02415 Cutaneous abscess of right lower limb: Secondary | ICD-10-CM | POA: Insufficient documentation

## 2014-03-21 LAB — GLUCOSE, CAPILLARY: Glucose-Capillary: 220 mg/dL — ABNORMAL HIGH (ref 70–99)

## 2014-03-21 MED ORDER — SULFAMETHOXAZOLE-TRIMETHOPRIM 800-160 MG PO TABS: 1.0000 | ORAL_TABLET | Freq: Two times a day (BID) | ORAL | Status: DC

## 2014-03-21 NOTE — Progress Notes (Signed)
   Subjective:    Patient ID: [name], male    DOB: 1956/01/21, 58 y.o.   MRN: [id number]  HPI [name] is a 58 yr old man with PMH of controlled DM2, HTN, NSTEMI, A. Fib, who presents, accompanied by his wife, for evaluation of right knee swelling x2-3 days. He reports that yesterday he noticed increased warmth in his right knee with some swelling and tenderness to touch. He pressed this area and reports that pus came out from 3 small openings around the swelling. He had this problem in the past and was hospitalized and received treatment for a "boil" of the right knee.  He denies recent falls or injury to the knee and had not had recent boils anywhere else in his body since his hospitalization in July 2013.   He has itchy dry skin of his bilateral lower extremities and scratches his legs and knees often. He has been seen by a Dermatologist for this problem and uses  "special" lotion and soap for this problem.    Review of Systems  Constitutional: Negative for fever, chills, diaphoresis, activity change, appetite change and fatigue.  Respiratory: Negative for cough, shortness of breath and wheezing.   Cardiovascular: Negative for chest pain, palpitations and leg swelling.  Gastrointestinal: Negative for nausea, vomiting, diarrhea and constipation.  Genitourinary: Negative for dysuria.  Musculoskeletal: Positive for joint swelling.       Swelling of the right knee  Skin: Positive for color change and wound.       Right knee swelling with 3 small openings with purulent discharge.   Neurological: Negative for dizziness and headaches.  Hematological: Negative for adenopathy.  Psychiatric/Behavioral: Negative for agitation.       Objective:   Physical Exam  Nursing note and vitals reviewed. Constitutional: He is oriented to person, place, and time. He appears well-developed and well-nourished. No distress.  Cardiovascular: Normal rate and regular rhythm.   Pulmonary/Chest: Effort  normal and breath sounds normal. No respiratory distress.  Musculoskeletal: Normal range of motion. He exhibits edema and tenderness.  Right knee with normal range of motion with 5cm by 5cm rectangular lesion in the lateral/suprapatelar aspect that is erythematous with ~3cm area of induration and some fluctuance with one small opening with purulent discharge that expressed with deep palpation. The fluctuance/induration are superficial and do not inhibit knee ROM, or patellar motion.   Neurological: He is alert and oriented to person, place, and time. Coordination normal.  Skin: Skin is warm and dry. No rash noted. He is not diaphoretic. There is erythema. No pallor.  Right knee lesion with localized erythema as described above.  Dry skin of LE and forearms. Several excoriation marks at the bilateral lower extremities. Several 1-2 cm nodular lesions noted in bilateral lower extremities with no edema, not TTP.  Bilateral vein varicosity of LE.   Psychiatric: He has a normal mood and affect. His behavior is normal.          Assessment & Plan:

## 2014-03-21 NOTE — Patient Instructions (Signed)
-  Apply warm compresses or rags to your knee 4-6 times per day for 20 minutes at a time.  -You may press on the boil to get the drainage/pus out.  -Take Bactrim DS 1 tablet twice per day for 7 days.  -If the boil is getting bigger, or if you have nausea, vomiting, diarrhea, or if your feel very sick, go to the ED. Otherwise, call us on Monday to let us know if things are getting better worse. -Make an appointment with Dr. Eulas Post for next month for diabetes care and blood pressure recheck.   Please bring your medicines with you each time you come.   Medicines may be  Eye drops  Herbal   Vitamins  Pills  Seeing these help [place] take care of you.  Abscess An abscess is an infected area that contains a collection of pus and debris.It can occur in almost any part of the body. An abscess is also known as a furuncle or boil. CAUSES  An abscess occurs when tissue gets infected. This can occur from blockage of oil or sweat glands, infection of hair follicles, or a minor injury to the skin. As the body tries to fight the infection, pus collects in the area and creates pressure under the skin. This pressure causes pain. People with weakened immune systems have difficulty fighting infections and get certain abscesses more often.  SYMPTOMS Usually an abscess develops on the skin and becomes a painful mass that is red, warm, and tender. If the abscess forms under the skin, you may feel a moveable soft area under the skin. Some abscesses break open (rupture) on their own, but most will continue to get worse without care. The infection can spread deeper into the body and eventually into the bloodstream, causing you to feel ill.  DIAGNOSIS  Your caregiver will take your medical history and perform a physical exam. A sample of fluid may also be taken from the abscess to determine what is causing your infection. TREATMENT  Your caregiver may prescribe antibiotic medicines to fight the infection. However,  taking antibiotics alone usually does not cure an abscess. Your caregiver may need to make a small cut (incision) in the abscess to drain the pus if it does not drain on its own. In some cases, gauze is packed into the abscess to reduce pain and to continue draining the area. HOME CARE INSTRUCTIONS   Only take over-the-counter or prescription medicines for pain, discomfort, or fever as directed by your caregiver.  If you were prescribed antibiotics, take them as directed. Finish them even if you start to feel better.  To avoid spreading the infection:  Keep your draining abscess covered with a bandage.  Wash your hands well.  Do not share personal care items, towels, or whirlpools with others.  Avoid skin contact with others.  Keep your skin and clothes clean around the abscess.  Keep all follow-up appointments as directed by your caregiver. SEEK MEDICAL CARE IF:   You have increased pain, swelling, redness, fluid drainage, or bleeding.  You have muscle aches, chills, or a general ill feeling.  You have a fever. MAKE SURE YOU:   Understand these instructions.  Will watch your condition.  Will get help right away if you are not doing well or get worse. Document Released: 08/25/2005 Document Revised: 05/16/2012 Document Reviewed: 01/28/2012 [facility] Patient Information 2014 [place].

## 2014-03-21 NOTE — Telephone Encounter (Signed)
Pt called, attempted to rtc, left message

## 2014-03-22 NOTE — Assessment & Plan Note (Signed)
Did not bring his meter to this visit but reports that his sugars are running in the 200s for the past 2 days. CBG of 220 during this visit. Pt assures compliance with his medications (glipizide 10mg  daily, metformin 1000mg  BID).  Will treat abscess and continue monitoring. He will follow up with his PCP next month.

## 2014-03-22 NOTE — Assessment & Plan Note (Signed)
He has an abscess of his right knee which is superficial, 5cmx5cm that is indurated with minimum fluctuance and one are of small opening with small amount of purulent expressed. At this point, I&D may not be ideal given that the abscess is almost completely indurated.  Pt denies systemic symptoms of infection, with normal ROM of his knee and no suspicion for joint infection.  Will treat conservatively for now with warm compresses to the abscess at least 4 times per day for 20 minutes at a time. The abscess will likely drain on its own given that it has at least one small opening at this time.  Rx Bactrim DS BID for 7 days for broader coverage for MRSA (and lower cost v doxycycline) Pt was asked to call back on Monday to report if abscess is not improving.  He was advised to go to the ED if his abscess gets bigger or if he develops fever/chills, N/V/D or has a reaction with the antibiotic.

## 2014-03-24 NOTE — Progress Notes (Signed)
I saw and evaluated the patient.  I personally confirmed the key portions of Dr. Bernadene Bell history and exam and reviewed pertinent patient test results.  The assessment, diagnosis, and plan were formulated together and I agree with the documentation in the resident's note.

## 2014-04-23 ENCOUNTER — Ambulatory Visit: Payer: No Typology Code available for payment source | Admitting: Registered Nurse

## 2014-05-03 ENCOUNTER — Telehealth: Payer: Self-pay | Admitting: *Deleted

## 2014-05-03 DIAGNOSIS — M47816 Spondylosis without myelopathy or radiculopathy, lumbar region: Secondary | ICD-10-CM

## 2014-05-03 DIAGNOSIS — M7062 Trochanteric bursitis, left hip: Secondary | ICD-10-CM

## 2014-05-03 DIAGNOSIS — M47817 Spondylosis without myelopathy or radiculopathy, lumbosacral region: Secondary | ICD-10-CM

## 2014-05-03 MED ORDER — MORPHINE SULFATE ER 30 MG PO TBCR: 30.0000 mg | EXTENDED_RELEASE_TABLET | Freq: Two times a day (BID) | ORAL | Status: DC

## 2014-05-03 MED ORDER — OXYCODONE-ACETAMINOPHEN 10-325 MG PO TABS: 1.0000 | ORAL_TABLET | Freq: Three times a day (TID) | ORAL | Status: DC | PRN

## 2014-05-03 NOTE — Telephone Encounter (Signed)
Calling to request refill of his MS Contin 30 mg # 60 and oxycodone 10/325 # 60.  These were last filled in April 16 & 21.  His next appt is sched for 05/14/14 with Naaman Plummer.  Printed for Naaman Plummer to sign.

## 2014-05-03 NOTE — Telephone Encounter (Signed)
Patient aware ms contin and oxycodone rx is ready for pick up.

## 2014-05-14 ENCOUNTER — Encounter: Payer: Self-pay | Admitting: Physical Medicine & Rehabilitation

## 2014-05-14 ENCOUNTER — Encounter
Payer: No Typology Code available for payment source | Attending: Physical Medicine & Rehabilitation | Admitting: Physical Medicine & Rehabilitation

## 2014-05-14 VITALS — BP 146/82 | HR 72 | Resp 14 | Ht 75.0 in | Wt 262.0 lb

## 2014-05-14 DIAGNOSIS — M76899 Other specified enthesopathies of unspecified lower limb, excluding foot: Secondary | ICD-10-CM | POA: Insufficient documentation

## 2014-05-14 DIAGNOSIS — M47816 Spondylosis without myelopathy or radiculopathy, lumbar region: Secondary | ICD-10-CM

## 2014-05-14 DIAGNOSIS — M7062 Trochanteric bursitis, left hip: Secondary | ICD-10-CM

## 2014-05-14 DIAGNOSIS — M47817 Spondylosis without myelopathy or radiculopathy, lumbosacral region: Secondary | ICD-10-CM | POA: Insufficient documentation

## 2014-05-14 MED ORDER — OXYCODONE-ACETAMINOPHEN 10-325 MG PO TABS: 1.0000 | ORAL_TABLET | Freq: Three times a day (TID) | ORAL | Status: DC | PRN

## 2014-05-14 MED ORDER — MORPHINE SULFATE ER 30 MG PO TBCR: 30.0000 mg | EXTENDED_RELEASE_TABLET | Freq: Two times a day (BID) | ORAL | Status: DC

## 2014-05-14 NOTE — Patient Instructions (Signed)
USE COMMON SENSE AND THINK ABOUT WHAT YOU'RE DOING!!!

## 2014-05-14 NOTE — Progress Notes (Signed)
Subjective:    Patient ID: [name], male    DOB: 08/17/1956, 58 y.o.   MRN: [id number]  HPI  [name] is back regarding his chronic back pain.   He had good results with the ESI from Dr. Letta Pate in April. It really helped his low back pain as well his pain radiating into the legs. He tweaked his back playing basketball after he got into a tussle with an opponent. His back has improved a little bit and has become tolerable again.   He is typically using the MS CR daily. He takes percocet once to twice daily---most often at night.   He is happy that he's been able to resume his active lifestyle. He still paints full time.    Pain Inventory Average Pain 7 Pain Right Now 2 My pain is constant  In the last 24 hours, has pain interfered with the following? General activity 0 Relation with others 0 Enjoyment of life 1 What TIME of day is your pain at its worst? evening Sleep (in general) Poor  Pain is worse with: unsure Pain improves with: medication Relief from Meds: 5  Mobility walk without assistance ability to climb steps?  yes do you drive?  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  yes  Neuro/Psych No problems in this area  Prior Studies Any changes since last visit?  yes  Bumped toe and thinks it may be broken  Physicians involved in your care Any changes since last visit?  no   History reviewed. No pertinent family history. History   Social History  . Marital Status: Married    Spouse Name: N/A    Number of Children: N/A  . Years of Education: N/A   Social History Main Topics  . Smoking status: Former Smoker -- 0.50 packs/day for 12 years    Types: Cigarettes    Quit date: 07/26/1979  . Smokeless tobacco: Never Used  . Alcohol Use: No  . Drug Use: No  . Sexual Activity: Yes   Other Topics Concern  . None   Social History Narrative  . None   Past Surgical History    Procedure Laterality Date  . Coronary angioplasty with stent placement  ~ 2005    "1"  . Cystectomy  1990's    LUA   Past Medical History  Diagnosis Date  . TOBACCO ABUSE 11/25/2006  . CORONARY ARTERY DISEASE 11/25/2006    Cardiac cath by Dr Terrence Dupont 11/2003 LV showed good LV systolic function, EF of 78-46%. Left main was patent. LAD has 20-30% mid stenosis. Diagonal 1 and diagonal 2 were patent. Left circumflex was patent. OM1 was less than 0.5 mm which was diffusely diseased. OM2 has 20% ostial stenosis which was patent which  was moderate size. OM3 was patent at prior PTCA and stented site. RCA has 20-30% proximal   . Hypertension   . Atrial arrhythmia   . Type II diabetes mellitus   . Cellulitis of knee, right 06/06/12  . Chronic otitis externa 06/06/12    C Multiple trials with antibiocs and antifungal. Cultures were positive for Pseudomonas. Follow up with ENT on 06/14/12    . NSTEMI (non-ST elevated myocardial infarction) 08/14/2012    S/p Successful PTCA to proximal OM-3      BP 146/82  Pulse 72  Resp 14  Ht 6\' 3"  (1.905 m)  Wt 262 lb (118.842 kg)  BMI 32.75 kg/m2  SpO2 95%  Opioid Risk Score:   Fall Risk Score: Low Fall Risk (0-5 points) (educated and handout given for fall prevention in home )  Review of Systems  Musculoskeletal: Positive for back pain.       Foot pain right  All other systems reviewed and are negative.      Objective:   Physical Exam  General: Alert and oriented x 3, more comfortable. Large frame/obese  HEENT: Head is normocephalic, atraumatic, PERRLA, EOMI, sclera anicteric, oral mucosa pink and moist, dentition intact, ext ear canals clear,  Neck: Supple without JVD or lymphadenopathy  Heart: Reg rate and rhythm. No murmurs rubs or gallops  Chest: CTA bilaterally without wheezes, rales, or rhonchi; no distress  Abdomen: Soft, non-tender, non-distended, bowel sounds positive.  Extremities: No clubbing, cyanosis, or edema. Pulses are 2+  Skin:  Clean and intact without signs of breakdown  Neuro: Pt is cognitively appropriate with normal insight, memory, and awareness. Cranial nerves 2-12 are intact. Sensory exam is normal. Reflexes are 1+ in all 4's. Fine motor coordination is intact. No tremors. Motor function is grossly 5/5 except for pain inhibition in the left leg.  Musculoskeletal: Had mild pain with flexion and extension today. ROM overall improved. Tender to palpation over the lower lumbar paraspinals. Facet maneuvers were mildly provocative. SLR negative. Hamstrings a little tight. Right 4th toe slightly bruised and tender along PIP. Psych: Pt's affect is appropriate. Pt is cooperative. He is very pleasant.    Assessment & Plan:   1. Low back pain with severe, left greater than right facet disease at L5-S1. Also with degenerative disk disease and some encroachment upon the S1 nerve root sleeve. The April ESI was very helpful at L5-S1 (translaminar right) 2. DM2--   3. CAD with medicated stent, on plavix  4. Greater trochanteric bursitis (left)- may be overlapping referral pattern from back as well.     Plan:  1. Consider repeat ESI at L5-S1. At this point, I would hold off as he's doing fairly well. He needs to use a little common sense as well!! 2. Discussed hamstring and core muscle strengthening and exercise. 3. Percocet prn for breakthrough pain. Continue MS Contin 30mg  CR daily. 4. Robaxin for muscle spasm, 500mg  q6 prn  5..Will see him back in 2 months.  30 minutes of face to face patient care time were spent during this visit. All questions were encouraged and answered.

## 2014-05-16 ENCOUNTER — Ambulatory Visit: Payer: No Typology Code available for payment source | Admitting: Podiatry

## 2014-05-22 ENCOUNTER — Ambulatory Visit (INDEPENDENT_AMBULATORY_CARE_PROVIDER_SITE_OTHER): Payer: No Typology Code available for payment source | Admitting: Internal Medicine

## 2014-05-22 ENCOUNTER — Encounter: Payer: Self-pay | Admitting: Internal Medicine

## 2014-05-22 VITALS — BP 112/73 | HR 73 | Temp 98.4°F | Ht 74.0 in | Wt 262.3 lb

## 2014-05-22 DIAGNOSIS — R609 Edema, unspecified: Secondary | ICD-10-CM

## 2014-05-22 DIAGNOSIS — R21 Rash and other nonspecific skin eruption: Secondary | ICD-10-CM

## 2014-05-22 DIAGNOSIS — E119 Type 2 diabetes mellitus without complications: Secondary | ICD-10-CM

## 2014-05-22 DIAGNOSIS — G4700 Insomnia, unspecified: Secondary | ICD-10-CM | POA: Insufficient documentation

## 2014-05-22 DIAGNOSIS — R600 Localized edema: Secondary | ICD-10-CM | POA: Insufficient documentation

## 2014-05-22 DIAGNOSIS — I10 Essential (primary) hypertension: Secondary | ICD-10-CM

## 2014-05-22 LAB — LIPID PANEL
Cholesterol: 101 mg/dL (ref 0–200)
HDL: 29 mg/dL — ABNORMAL LOW (ref >39)
LDL Cholesterol: 54 mg/dL (ref 0–99)
Total CHOL/HDL Ratio: 3.5 Ratio
Triglycerides: 90 mg/dL (ref <150)
VLDL: 18 mg/dL (ref 0–40)

## 2014-05-22 LAB — POCT GLYCOSYLATED HEMOGLOBIN (HGB A1C): HEMOGLOBIN A1C: 6.7

## 2014-05-22 LAB — GLUCOSE, CAPILLARY: GLUCOSE-CAPILLARY: 115 mg/dL — AB (ref 70–99)

## 2014-05-22 MED ORDER — ZOLPIDEM TARTRATE ER 12.5 MG PO TBCR: 12.5000 mg | EXTENDED_RELEASE_TABLET | Freq: Every evening | ORAL | Status: DC | PRN

## 2014-05-22 MED ORDER — FLUOCINONIDE 0.05 % EX CREA: 1.0000 "application " | TOPICAL_CREAM | Freq: Two times a day (BID) | CUTANEOUS | Status: DC

## 2014-05-22 NOTE — Patient Instructions (Signed)
General Instructions:   Please bring your medicines with you each time you come to clinic.  Medicines may include prescription medications, over-the-counter medications, herbal remedies, eye drops, vitamins, or other pills.   Progress Toward Treatment Goals:  Treatment Goal 01/10/2014  Hemoglobin A1C at goal  Blood pressure at goal    Self Care Goals & Plans:  Self Care Goal 01/10/2014  Manage my medications take my medicines as prescribed; bring my medications to every visit; refill my medications on time; follow the sick day instructions if I am sick  Monitor my health keep track of my weight; bring my blood pressure log to each visit; check my feet daily  Eat healthy foods eat more vegetables; eat fruit for snacks and desserts; eat baked foods instead of fried foods; drink diet soda or water instead of juice or soda  Be physically active find an activity I enjoy  Other -    Home Blood Glucose Monitoring 01/10/2014  Check my blood sugar (No Data)  When to check my blood sugar -     Care Management & Community Referrals:  Referral 10/04/2013  Referrals made for care management support none needed       Zolpidem tablets What is this medicine? ZOLPIDEM (zole PI dem) is used to treat insomnia. This medicine helps you to fall asleep and sleep through the night. This medicine may be used for other purposes; ask your health care provider or pharmacist if you have questions. COMMON BRAND NAME(S): Ambien What should I tell my health care provider before I take this medicine? They need to know if you have any of these conditions: -depression -history of a drug or alcohol abuse problem -liver disease -lung or breathing disease -suicidal thoughts -an unusual or allergic reaction to zolpidem, other medicines, foods, dyes, or preservatives -pregnant or trying to get pregnant -breast-feeding How should I use this medicine? Take this medicine by mouth with a glass of water. Follow  the directions on the prescription label. It is better to take this medicine on an empty stomach and only when you are ready for bed. Do not take your medicine more often than directed. If you have been taking this medicine for several weeks and suddenly stop taking it, you may get unpleasant withdrawal symptoms. Your doctor or health care professional may want to gradually reduce the dose. Do not stop taking this medicine on your own. Always follow your doctor or health care professional's advice. A special MedGuide will be given to you by the pharmacist with each prescription and refill. Be sure to read this information carefully each time. Talk to your pediatrician regarding the use of this medicine in children. Special care may be needed. Overdosage: If you think you have taken too much of this medicine contact a poison control center or emergency room at once. NOTE: This medicine is only for you. Do not share this medicine with others. What if I miss a dose? This does not apply. This medicine should only be taken immediately before going to sleep. Do not take double or extra doses. What may interact with this medicine? -herbal medicines like kava kava, melatonin, St. John's wort and valerian -medicines for fungal infections like ketoconazole, fluconazole, or itraconazole -medicines for treating depression or other mental problems -other medicines given for sleep -some medicines for Parkinson' s disease or other movement disorders -some medicines used to treat HIV infection or AIDS, like ritonavir This list may not describe all possible interactions. Give your health care  provider a list of all the medicines, herbs, non-prescription drugs, or dietary supplements you use. Also tell them if you smoke, drink alcohol, or use illegal drugs. Some items may interact with your medicine. What should I watch for while using this medicine? Visit your doctor or health care professional for regular checks on  your progress. Keep a regular sleep schedule by going to bed at about the same time each night. Avoid caffeine-containing drinks in the evening hours. When sleep medicines are used every night for more than a few weeks, they may stop working. Talk to your doctor if you still have trouble sleeping. Do not take this medicine unless you are able to get a full night's sleep before you must be active again. You may not be able to remember things that you do in the hours after you take this medicine. Some people have reported driving, making phone calls, or preparing and eating food while asleep after taking sleep medicine. Take this medicine right before going to sleep. Tell your doctor if you are have any problems with your memory. After you stop taking this medicine, you may have trouble falling asleep. This is called rebound insomnia. This problem usually goes away on its own after 1 or 2 nights. You may get drowsy or dizzy. Do not drive, use machinery, or do anything that needs mental alertness until you know how this medicine affects you. Do not stand or sit up quickly, especially if you are an older patient. This reduces the risk of dizzy or fainting spells. Alcohol may interfere with the effect of this medicine. Avoid alcoholic drinks. This medicine may cause a decrease in mental alertness the morning after use, even if you feel that you are fully awake. Tell your doctor if you will need to perform activities requiring full alertness, such as driving, the next morning after you have taken this medicine. If you or your family notice any changes in your behavior, or if you have any unusual or disturbing thoughts, call your doctor right away. What side effects may I notice from receiving this medicine? Side effects that you should report to your doctor or health care professional as soon as possible: -allergic reactions like skin rash, itching or hives, swelling of the face, lips, or tongue -changes in  vision -confusion -depressed mood -feeling faint or lightheaded, falls -hallucinations -problems with balance, speaking, walking -restlessness, excitability, or feelings of agitation -unusual activities while asleep like driving, eating, making phone calls Side effects that usually do not require medical attention (report to your doctor or health care professional if they continue or are bothersome): -diarrhea -dizziness, or daytime drowsiness, sometimes called a hangover effect -headache This list may not describe all possible side effects. Call your doctor for medical advice about side effects. You may report side effects to FDA at 1-800-FDA-1088. Where should I keep my medicine? Keep out of the reach of children. This medicine can be abused. Keep your medicine in a safe place to protect it from theft. Do not share this medicine with anyone. Selling or giving away this medicine is dangerous and against the law. Store at room temperature between 20 and 25 degrees C (68 and 77 degrees F). Throw away any unused medicine after the expiration date. NOTE: This sheet is a summary. It may not cover all possible information. If you have questions about this medicine, talk to your doctor, pharmacist, or health care provider.  2015, [employer]vier/Gold Standard. (2012-10-31 16:54:48)

## 2014-05-22 NOTE — Assessment & Plan Note (Signed)
Lab Results  Component Value Date   HGBA1C 6.7 05/22/2014   HGBA1C 6.4 01/10/2014   HGBA1C 6.6 10/04/2013     Assessment: Diabetes control: good control (HgbA1C at goal) Progress toward A1C goal:  at goal   Plan: Medications:  continue current medications Home glucose monitoring: Frequency: once a day Timing: before meals Instruction/counseling given: reminded to get eye exam and reminded to bring medications to each visit Educational resources provided:   Self management tools provided:   Other plans: F/u in 3 mo

## 2014-05-22 NOTE — Assessment & Plan Note (Signed)
Pt with difficulty falling asleep and is not able to sleep through the night x3-4 months. He is requesting a sleeping aid.  - Will begin Ambien 12.5mg  qhs PRN for insomnia

## 2014-05-22 NOTE — Progress Notes (Signed)
Patient ID: [name], male   DOB: [birth year], 57 y.o.   MRN: [id number]  Subjective:   Patient ID: [name] male   DOB: 06-03-1956 58 y.o.   MRN: [id number]  HPI: [name] is a 58 y.o. M w/ PMH DM2, HTN, NSTEMI, A. Fib, and chronic back pain presents for a routine f/u for his diabetes and HTN.  Today he is doing well overall. He is complaining of a recurrence of dry, itchy skin that he was previously seeing a dermatologist. He states that he has been told in the past that it is an allergic reaction. He states that it is somewhat itchy and denies any pain or drainage from the site. He is keeping the site clean. He is requesting a referral back to Dermatology.   He also c/o insomnia that has been going on for the past 3-4 months. He has difficult falling asleep and staying asleep. He states that his symptoms began around the time of increased stressors in his life, but states that these have abated and he is still having trouble sleeping.   He denies fevers, chills, headaches, vision changes, chest pain, SOB, abd pain, N/V/D/constipation. His back pain is well controlled.   Past Medical History  Diagnosis Date  . TOBACCO ABUSE 11/25/2006  . CORONARY ARTERY DISEASE 11/25/2006    Cardiac cath by Dr Terrence Dupont 11/2003 LV showed good LV systolic function, EF of 27-06%. Left main was patent. LAD has 20-30% mid stenosis. Diagonal 1 and diagonal 2 were patent. Left circumflex was patent. OM1 was less than 0.5 mm which was diffusely diseased. OM2 has 20% ostial stenosis which was patent which  was moderate size. OM3 was patent at prior PTCA and stented site. RCA has 20-30% proximal   . Hypertension   . Atrial arrhythmia   . Type II diabetes mellitus   . Cellulitis of knee, right 06/06/12  . Chronic otitis externa 06/06/12    C Multiple trials with antibiocs and antifungal. Cultures were positive for Pseudomonas. Follow up with ENT on 06/14/12    . NSTEMI (non-ST elevated myocardial infarction)  08/14/2012    S/p Successful PTCA to proximal OM-3      Current Outpatient Prescriptions  Medication Sig Dispense Refill  . acetaminophen (TYLENOL) 500 MG tablet Take 1 tablet (500 mg total) by mouth every 8 (eight) hours as needed for fever.  30 tablet  0  . aspirin EC 81 MG tablet Take 1 tablet (81 mg total) by mouth daily.  90 tablet  3  . benzonatate (TESSALON) 200 MG capsule Take 1 capsule (200 mg total) by mouth 3 (three) times daily as needed for cough.  20 capsule  0  . Blood Glucose Monitoring Suppl (ONE TOUCH ULTRA MINI) W/DEVICE KIT Check blood sugar 3x a day dx code 250.00  300 each  3  . glipiZIDE (GLUCOTROL) 10 MG tablet Take 1 tablet (10 mg total) by mouth daily.  90 tablet  3  . glucose blood (ONE TOUCH ULTRA TEST) test strip Check blood sugar 3x a day dx code 250.00  300 each  3  . ibuprofen (ADVIL) 200 MG tablet Take 3 tablets (600 mg total) by mouth every 8 (eight) hours as needed for moderate pain.  100 tablet  2  . isosorbide mononitrate (IMDUR) 30 MG 24 hr tablet Take 1 tablet (30 mg total) by mouth daily.  90 tablet  3  . lisinopril (PRINIVIL,ZESTRIL) 5 MG tablet Take 1 tablet (5 mg  total) by mouth daily.  90 tablet  3  . metFORMIN (GLUCOPHAGE) 1000 MG tablet Take 1 tablet (1,000 mg total) by mouth 2 (two) times daily with a meal.  180 tablet  3  . methocarbamol (ROBAXIN) 500 MG tablet Take 1 tablet (500 mg total) by mouth every 6 (six) hours as needed for muscle spasms.  90 tablet  3  . morphine (MS CONTIN) 30 MG 12 hr tablet Take 1 tablet (30 mg total) by mouth every 12 (twelve) hours.  30 tablet  0  . NITROSTAT 0.4 MG SL tablet       . ONETOUCH DELICA LANCETS FINE MISC 1 each by Does not apply route 3 (three) times daily. Check blood sugar 3x a day dx code 250.00  300 each  3  . oxyCODONE-acetaminophen (PERCOCET) 10-325 MG per tablet Take 1 tablet by mouth every 8 (eight) hours as needed for pain.  60 tablet  0  . pravastatin (PRAVACHOL) 80 MG tablet Take 1 tablet (80 mg  total) by mouth daily.  90 tablet  3  . selenium sulfide (SELSUN) 2.5 % shampoo       . sotalol (BETAPACE) 80 MG tablet Take 0.5 tablets (40 mg total) by mouth 2 (two) times daily. Initially prescribed by Dr Terrence Dupont  180 tablet  3  . sulfamethoxazole-trimethoprim (BACTRIM DS,SEPTRA DS) 800-160 MG per tablet Take 1 tablet by mouth 2 (two) times daily.  14 tablet  0   No current facility-administered medications for this visit.   No family history on file. History   Social History  . Marital Status: Married    Spouse Name: N/A    Number of Children: N/A  . Years of Education: N/A   Social History Main Topics  . Smoking status: Former Smoker -- 0.50 packs/day for 12 years    Types: Cigarettes    Quit date: 07/26/1979  . Smokeless tobacco: Never Used  . Alcohol Use: No  . Drug Use: No  . Sexual Activity: Yes   Other Topics Concern  . None   Social History Narrative  . None   Review of Systems: A 12 point ROS was performed; pertinent positives and negatives were noted in the HPI   Objective:  Physical Exam: Filed Vitals:   05/22/14 1558  BP: 112/73  Pulse: 73  Temp: 98.4 F (36.9 C)  TempSrc: Oral  Height: _0  (1.88 m)  Weight: 262 lb 4.8 oz (118.978 kg)  SpO2: 95%   Constitutional: Vital signs reviewed.  Patient is a well-developed and well-nourished male in no acute distress and cooperative with exam.   Head: Normocephalic and atraumatic Eyes: PERRL, EOMI, conjunctivae normal, No scleral icterus.  Cardiovascular: RRR, no MRG, pulses symmetric and intact bilaterally Pulmonary/Chest: normal respiratory effort, CTAB, no wheezes, rales, or rhonchi Abdominal: Soft. Non-tender, non-distended Musculoskeletal: No joint deformities and nontender. Moves all 4 extremities without difficulty. Normal gait.  Neurological: A&O x3, cranial nerve II-XII are grossly intact, no focal deficits Skin: RLE with scattered excoriations, and scattered, nonpapular areas of scaly skin with  erythema, the largest measuring approximately 5cm on the distal shin. No drainage visible and nontender. 1+pitting edema of BLE. Psychiatric: Normal mood and affect. speech and behavior is normal.   Assessment & Plan:   Please refer to Problem List based Assessment and Plan

## 2014-05-22 NOTE — Assessment & Plan Note (Addendum)
1+ pitting edema of BLE. This is a new finding. The pt was unaware of previous swelling in his legs. He denies any pain in his lower extremities, and he denies any SOB or difficulty breathing. 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 I suspect that this is dependent edema and possibly some venous insufficiency. No h/o heart failure and the patient is not exhibiting signs of CHF. Renal function has been normal in the past and the pt denies any changes in urination. Will monitor for now.

## 2014-05-22 NOTE — Assessment & Plan Note (Signed)
Pt states that he was diagnosed with an allergic skin reaction in the past that was seeing a Dermatologist for. He states that this "rash" has returned and he would like a referral aback to Dermatology. The skin is clean and does not appear infected. On chart review, he was seen by Dr. Allyson Sabal 02/2013 and was diagnosed with eczema and seborrheic dermatitis. He was prescribed flucinonide and told to f/u PRN.  - Flucinonide 0.05% topical cream, apply thin layer over rash on RLE BID PRN - Will send back to Dr. Allyson Sabal to reestablish care with him.

## 2014-05-22 NOTE — Assessment & Plan Note (Signed)
BP Readings from Last 3 Encounters:  05/22/14 112/73  05/14/14 146/82  03/21/14 132/83    Lab Results  Component Value Date   NA 139 05/10/2013   K 4.3 05/10/2013   CREATININE 0.65 05/10/2013    Assessment: Blood pressure control: controlled Progress toward BP goal:  at goal   Plan: Medications:  continue current medications Educational resources provided:   Self management tools provided:   Other plans: f/u in 3 mo

## 2014-05-23 NOTE — Progress Notes (Signed)
Attending physician note: Presenting problems, physical findings, medications, reviewed with resident physician Dr. Lennart Pall and I concur with her management. Murriel Hopper, M.D., [place]

## 2014-06-04 NOTE — Addendum Note (Signed)
Addended by: Hulan Fray on: 06/04/2014 07:12 PM   Modules accepted: Orders

## 2014-07-02 ENCOUNTER — Telehealth: Payer: Self-pay | Admitting: *Deleted

## 2014-07-02 NOTE — Telephone Encounter (Signed)
Called to let Dr Naaman Plummer know that his pain in his back is coming back.  Just FYI

## 2014-07-15 ENCOUNTER — Telehealth: Payer: Self-pay

## 2014-07-15 DIAGNOSIS — M47817 Spondylosis without myelopathy or radiculopathy, lumbosacral region: Secondary | ICD-10-CM

## 2014-07-15 DIAGNOSIS — M7062 Trochanteric bursitis, left hip: Secondary | ICD-10-CM

## 2014-07-15 DIAGNOSIS — M47816 Spondylosis without myelopathy or radiculopathy, lumbar region: Secondary | ICD-10-CM

## 2014-07-15 MED ORDER — MORPHINE SULFATE ER 30 MG PO TBCR: 30.0000 mg | EXTENDED_RELEASE_TABLET | Freq: Two times a day (BID) | ORAL | Status: DC

## 2014-07-15 MED ORDER — OXYCODONE-ACETAMINOPHEN 10-325 MG PO TABS: 1.0000 | ORAL_TABLET | Freq: Three times a day (TID) | ORAL | Status: DC | PRN

## 2014-07-15 NOTE — Telephone Encounter (Signed)
Attempted to contact patient. Left a message informing patient that his RX is ready for pickup.

## 2014-07-15 NOTE — Telephone Encounter (Signed)
Patient is requesting a refill on Oxycodone and Morphine. Patient has a appt on 8/21. RX printed for Naaman Plummer to sign. Will contact patient when ready for pickup.

## 2014-07-19 ENCOUNTER — Encounter: Payer: No Typology Code available for payment source | Admitting: Physical Medicine & Rehabilitation

## 2014-07-31 ENCOUNTER — Other Ambulatory Visit: Payer: Self-pay | Admitting: Internal Medicine

## 2014-08-22 ENCOUNTER — Other Ambulatory Visit: Payer: Self-pay | Admitting: *Deleted

## 2014-08-22 DIAGNOSIS — M47817 Spondylosis without myelopathy or radiculopathy, lumbosacral region: Secondary | ICD-10-CM

## 2014-08-22 DIAGNOSIS — M7062 Trochanteric bursitis, left hip: Secondary | ICD-10-CM

## 2014-08-22 DIAGNOSIS — M47816 Spondylosis without myelopathy or radiculopathy, lumbar region: Secondary | ICD-10-CM

## 2014-08-22 MED ORDER — OXYCODONE-ACETAMINOPHEN 10-325 MG PO TABS: 1.0000 | ORAL_TABLET | Freq: Three times a day (TID) | ORAL | Status: DC | PRN

## 2014-08-22 MED ORDER — MORPHINE SULFATE ER 30 MG PO TBCR: 30.0000 mg | EXTENDED_RELEASE_TABLET | Freq: Every day | ORAL | Status: DC

## 2014-08-22 MED ORDER — MORPHINE SULFATE ER 30 MG PO TBCR: 30.0000 mg | EXTENDED_RELEASE_TABLET | Freq: Two times a day (BID) | ORAL | Status: DC

## 2014-08-22 NOTE — Telephone Encounter (Signed)
Spoke with patient  - RX ready for pick up and to schedule his appt when he gets here today

## 2014-08-22 NOTE — Telephone Encounter (Signed)
Patient will pick up prescription today/  Script reprinted, according to Dr. Naaman Plummer  Note the MS Contin is daily. Percocet was reprinted as the same time.  Previous script discarded

## 2014-09-12 ENCOUNTER — Ambulatory Visit (INDEPENDENT_AMBULATORY_CARE_PROVIDER_SITE_OTHER): Payer: No Typology Code available for payment source | Admitting: Internal Medicine

## 2014-09-12 ENCOUNTER — Encounter: Payer: Self-pay | Admitting: Internal Medicine

## 2014-09-12 VITALS — BP 124/77 | HR 58 | Temp 98.1°F | Ht 75.0 in | Wt 262.3 lb

## 2014-09-12 DIAGNOSIS — J029 Acute pharyngitis, unspecified: Secondary | ICD-10-CM

## 2014-09-12 DIAGNOSIS — E119 Type 2 diabetes mellitus without complications: Secondary | ICD-10-CM

## 2014-09-12 LAB — GLUCOSE, CAPILLARY: GLUCOSE-CAPILLARY: 149 mg/dL — AB (ref 70–99)

## 2014-09-12 LAB — POCT GLYCOSYLATED HEMOGLOBIN (HGB A1C): Hemoglobin A1C: 6.2

## 2014-09-12 MED ORDER — BENZOCAINE-MENTHOL 10-2.1 MG MT LOZG: 1.0000 | LOZENGE | Freq: Three times a day (TID) | OROMUCOSAL | Status: DC | PRN

## 2014-09-12 NOTE — Progress Notes (Signed)
Patient ID: [name], male   DOB: 1956/09/08, 58 y.o.   MRN: [id number]   Subjective:   HPI: [name] is a 58 y.o. M w/ PMH DM2, HTN, NSTEMI, A. Fib, and chronic back pain presents for acute visit due to sore throat.   Reason(s) for this visit: 1. Sore throat: Symptoms of been present for 4 days. The pain is present on swallowing, and he also feels slight soreness on the left side of his neck. Otherwise, he denies fevers, chills, or increased fatigue. His appetite has been normal. He denies any upper respiratory tract symptoms. He has not been in contact with anyone with similar symptoms. He has been trying over-the-counter mouth sprays without much improvement. No earaches, or ear drainage. He denies cough.  ROS: Respiratory: Denies SOB, DOE, cough, chest tightness, and wheezing. Denies chest pain. CVS: No chest pain, palpitations and leg swelling.  GI: No abdominal pain, nausea, vomiting, bloody stools GU: No dysuria, frequency, hematuria, or flank pain.  MSK: Chronic low back pain>> unchanged.  Psych: No depression symptoms. No SI or SA.    Objective:  Physical Exam: Filed Vitals:   09/12/14 1527  BP: 124/77  Pulse: 58  Temp: 98.1 F (36.7 C)  TempSrc: Oral  Height: 6\' 3"  (1.905 m)  Weight: 262 lb 4.8 oz (118.978 kg)  SpO2: 98%   General: Well nourished. No acute distress.  HEENT: Normal oral mucosa. MMM. Slight hyperemia of the oropharynx with slight enlargement of the left tonsil. There is no exudates of the oropharynx. I do not appreciate any lymphadenopathy. However, there is slight tenderness on the left side of his neck. I do not suspect any retropharyngeal abscess. His upper airways widely patent. Lungs: CTA bilaterally. Heart: RRR; no extra sounds or murmurs  Abdomen: Non-distended, normal bowel sounds, soft, nontender; no hepatosplenomegaly  Extremities: No pedal edema. No joint swelling or tenderness. Neurologic: Normal EOM,  Alert and oriented x3. No  obvious neurologic/cranial nerve deficits.  Assessment & Plan:  Discussed case with my attending in the clinic, Dr. Daryll Drown. See problem based charting.

## 2014-09-12 NOTE — Assessment & Plan Note (Signed)
Lab Results  Component Value Date   HGBA1C 6.2 09/12/2014   HGBA1C 6.7 05/22/2014   HGBA1C 6.4 01/10/2014     Assessment: Diabetes control: good control (HgbA1C at goal) Progress toward A1C goal:  at goal Comments: compliant with treatment.  Plan: Medications:  continue current medications Home glucose monitoring: Frequency: once a day Timing:   Instruction/counseling given: reminded to bring medications to each visit and discussed the need for weight loss Educational resources provided: brochure;handout Self management tools provided:   Other plans: follow up with PCP routinely.

## 2014-09-12 NOTE — Assessment & Plan Note (Signed)
Assessment: Most likely diagnosis: Viral pharyngitis in the view of presence of fevers, and the patient is afebrile in the office.  Ddx: Group. A strep pharyngitis. I do not suspect abscess formation. Upper airway is widely patent.   Plan: 1. Labs/imaging: Performed of group. A strep swab 2. Therapy: Continue with symptomatic management. Recommended Cepacol lozenges and other home regimens explained to patient. 3. Follow up: Discussed with the patient about danger signs of worsening pharyngitis including shortness of breath, increased pain on swallowing, fevers, chills, or increased fatigue or simply persistent symptoms for the next 3-4 days. If we find any evidence of group. A strep, I will call in an antibiotic. Otherwise, reassured the patient in the symptoms are likely to improve over the next few days.

## 2014-09-12 NOTE — Patient Instructions (Addendum)
General Instructions: Please use Cepacol sore throat Lozenges for your sore throat  I will call you with results from your throat swab Please come back if symptoms persist Otherwise follow up with your regular doctor in 3 months  Please bring your medicines with you each time you come to clinic.  Medicines may include prescription medications, over-the-counter medications, herbal remedies, eye drops, vitamins, or other pills.   Progress Toward Treatment Goals:  Treatment Goal 09/12/2014  Hemoglobin A1C at goal  Blood pressure at goal    Self Care Goals & Plans:  Self Care Goal 09/12/2014  Manage my medications take my medicines as prescribed; bring my medications to every visit; refill my medications on time; follow the sick day instructions if I am sick  Monitor my health keep track of my weight; check my feet daily  Eat healthy foods eat more vegetables; eat fruit for snacks and desserts; eat foods that are low in salt; eat baked foods instead of fried foods; drink diet soda or water instead of juice or soda  Be physically active find an activity I enjoy  Other -    Home Blood Glucose Monitoring 09/12/2014  Check my blood sugar once a day  When to check my blood sugar -     Care Management & Community Referrals:  Referral 10/04/2013  Referrals made for care management support none needed       Sore Throat A sore throat is a painful, burning, sore, or scratchy feeling of the throat. There may be pain or tenderness when swallowing or talking. You may have other symptoms with a sore throat. These include coughing, sneezing, fever, or a swollen neck. A sore throat is often the first sign of another sickness. These sicknesses may include a cold, flu, strep throat, or an infection called mono. Most sore throats go away without medical treatment.  HOME CARE   Only take medicine as told by your doctor.  Drink enough fluids to keep your pee (urine) clear or pale yellow.  Rest  as needed.  Try using throat sprays, lozenges, or suck on hard candy (if older than 4 years or as told).  Sip warm liquids, such as broth, herbal tea, or warm water with honey. Try sucking on frozen ice pops or drinking cold liquids.  Rinse the mouth (gargle) with salt water. Mix 1 teaspoon salt with 8 ounces of water.  Do not smoke. Avoid being around others when they are smoking.  Put a humidifier in your bedroom at night to moisten the air. You can also turn on a hot shower and sit in the bathroom for 5-10 minutes. Be sure the bathroom door is closed. GET HELP RIGHT AWAY IF:   You have trouble breathing.  You cannot swallow fluids, soft foods, or your spit (saliva).  You have more puffiness (swelling) in the throat.  Your sore throat does not get better in 7 days.  You feel sick to your stomach (nauseous) and throw up (vomit).  You have a fever or lasting symptoms for more than 2-3 days.  You have a fever and your symptoms suddenly get worse. MAKE SURE YOU:   Understand these instructions.  Will watch your condition.  Will get help right away if you are not doing well or get worse. Document Released: 08/24/2008 Document Revised: 08/09/2012 Document Reviewed: 07/23/2012 [facility] Patient Information 2015 [place], [place]. This information is not intended to replace advice given to you by your health care provider. Make sure you discuss  any questions you have with your health care provider.  

## 2014-09-13 LAB — RAPID STREP SCREEN (MED CTR MEBANE ONLY): Streptococcus, Group A Screen (Direct): NEGATIVE

## 2014-09-18 LAB — CULTURE, GROUP A STREP: Organism ID, Bacteria: NORMAL

## 2014-09-19 ENCOUNTER — Encounter: Payer: No Typology Code available for payment source | Admitting: Registered Nurse

## 2014-09-21 NOTE — Progress Notes (Signed)
Internal Medicine Clinic Attending  Case discussed with Dr. Kazibwe soon after the resident saw the patient.  We reviewed the resident's history and exam and pertinent patient test results.  I agree with the assessment, diagnosis, and plan of care documented in the resident's note. 

## 2014-09-26 ENCOUNTER — Other Ambulatory Visit: Payer: Self-pay | Admitting: Physical Medicine & Rehabilitation

## 2014-09-26 ENCOUNTER — Encounter: Payer: Self-pay | Admitting: Registered Nurse

## 2014-09-26 ENCOUNTER — Encounter: Payer: No Typology Code available for payment source | Attending: Registered Nurse | Admitting: Registered Nurse

## 2014-09-26 VITALS — BP 138/80 | HR 68 | Resp 14 | Ht 73.0 in | Wt 266.0 lb

## 2014-09-26 DIAGNOSIS — M7062 Trochanteric bursitis, left hip: Secondary | ICD-10-CM

## 2014-09-26 DIAGNOSIS — M5136 Other intervertebral disc degeneration, lumbar region: Secondary | ICD-10-CM | POA: Insufficient documentation

## 2014-09-26 DIAGNOSIS — M545 Low back pain: Secondary | ICD-10-CM | POA: Diagnosis not present

## 2014-09-26 DIAGNOSIS — G8929 Other chronic pain: Secondary | ICD-10-CM | POA: Diagnosis present

## 2014-09-26 DIAGNOSIS — Z760 Encounter for issue of repeat prescription: Secondary | ICD-10-CM | POA: Insufficient documentation

## 2014-09-26 DIAGNOSIS — G894 Chronic pain syndrome: Secondary | ICD-10-CM

## 2014-09-26 DIAGNOSIS — E118 Type 2 diabetes mellitus with unspecified complications: Secondary | ICD-10-CM | POA: Insufficient documentation

## 2014-09-26 DIAGNOSIS — Z79899 Other long term (current) drug therapy: Secondary | ICD-10-CM

## 2014-09-26 DIAGNOSIS — M47816 Spondylosis without myelopathy or radiculopathy, lumbar region: Secondary | ICD-10-CM

## 2014-09-26 DIAGNOSIS — M47817 Spondylosis without myelopathy or radiculopathy, lumbosacral region: Secondary | ICD-10-CM

## 2014-09-26 DIAGNOSIS — Z5181 Encounter for therapeutic drug level monitoring: Secondary | ICD-10-CM

## 2014-09-26 MED ORDER — OXYCODONE-ACETAMINOPHEN 10-325 MG PO TABS
1.0000 | ORAL_TABLET | Freq: Three times a day (TID) | ORAL | Status: DC | PRN
Start: 2014-09-26 — End: 2014-11-19

## 2014-09-26 MED ORDER — MORPHINE SULFATE ER 30 MG PO TBCR: 30.0000 mg | EXTENDED_RELEASE_TABLET | Freq: Every day | ORAL | Status: DC

## 2014-09-26 MED ORDER — OXYCODONE-ACETAMINOPHEN 10-325 MG PO TABS: 1.0000 | ORAL_TABLET | Freq: Three times a day (TID) | ORAL | Status: DC | PRN

## 2014-09-26 NOTE — Progress Notes (Signed)
Subjective:    Patient ID: [name], male    DOB: 12/22/1955, 58 y.o.   MRN: [id number]  HPI: [name] is a 58 year old male who returns for follow up for chronic pain and medication refill. He says his pain is located in his mid- lower back ( mainly right side). His current exercise regime is walking. He is working as a Curator, he's self employed.  Pain Inventory Average Pain 6 Pain Right Now 4 My pain is intermittent, burning and aching  In the last 24 hours, has pain interfered with the following? General activity 0 Relation with others 0 Enjoyment of life 0 What TIME of day is your pain at its worst? evening , night Sleep (in general) Poor  Pain is worse with: some activites Pain improves with: medication Relief from Meds: 6  Mobility walk without assistance how many minutes can you walk? 60 ability to climb steps?  yes do you drive?  yes Do you have any goals in this area?  yes  Function employed # of hrs/week 40 Do you have any goals in this area?  yes  Neuro/Psych No problems in this area  Prior Studies Any changes since last visit?  no  Physicians involved in your care Any changes since last visit?  no   History reviewed. No pertinent family history. History   Social History  . Marital Status: Married    Spouse Name: N/A    Number of Children: N/A  . Years of Education: N/A   Social History Main Topics  . Smoking status: Current Some Day Smoker -- 0.20 packs/day for 12 years    Types: Cigarettes    Last Attempt to Quit: 07/26/1979  . Smokeless tobacco: Never Used  . Alcohol Use: No  . Drug Use: No  . Sexual Activity: Yes   Other Topics Concern  . None   Social History Narrative  . None   Past Surgical History  Procedure Laterality Date  . Coronary angioplasty with stent placement  ~ 2005    "1"  . Cystectomy  1990's    LUA   Past Medical History  Diagnosis Date  . TOBACCO ABUSE 11/25/2006  . CORONARY ARTERY DISEASE  11/25/2006    Cardiac cath by Dr Terrence Dupont 11/2003 LV showed good LV systolic function, EF of 76-54%. Left main was patent. LAD has 20-30% mid stenosis. Diagonal 1 and diagonal 2 were patent. Left circumflex was patent. OM1 was less than 0.5 mm which was diffusely diseased. OM2 has 20% ostial stenosis which was patent which  was moderate size. OM3 was patent at prior PTCA and stented site. RCA has 20-30% proximal   . Hypertension   . Atrial arrhythmia   . Type II diabetes mellitus   . Cellulitis of knee, right 06/06/12  . Chronic otitis externa 06/06/12    C Multiple trials with antibiocs and antifungal. Cultures were positive for Pseudomonas. Follow up with ENT on 06/14/12    . NSTEMI (non-ST elevated myocardial infarction) 08/14/2012    S/p Successful PTCA to proximal OM-3      BP 138/80  Pulse 68  Resp 14  Ht 6\' 1"  (1.854 m)  Wt 266 lb (120.657 kg)  BMI 35.10 kg/m2  SpO2 98%  Opioid Risk Score:   Fall Risk Score:   Review of Systems     Objective:   Physical Exam  Nursing note and vitals reviewed. Constitutional: He is oriented to person, place, and time.  He appears well-developed and well-nourished.  HENT:  Head: Normocephalic and atraumatic.  Neck: Normal range of motion. Neck supple.  Cardiovascular: Normal rate and regular rhythm.   Pulmonary/Chest: Effort normal and breath sounds normal.  Musculoskeletal:  Normal Muscle Bulk and Muscle Testing Reveals: Upper Extremities: Full ROM and Muscle Strength 5/5 Lumbar Paraspinal Tenderness: L-3- L-5 Lower Extremities: Full ROM and Muscle Strength 5/5 Arises from chair with ease Narrow based Gait  Neurological: He is alert and oriented to person, place, and time.  Skin: Skin is warm and dry.  Psychiatric: He has a normal mood and affect.          Assessment & Plan:  1. Low back pain with severe, left greater than right facet disease at L5-S1. Also with degenerative disk disease and some encroachment upon the S1 nerve root  sleeve.  Scheduled ESI-S1 with Dr. Letta Pate. Continue with Exercise and Heat Therapy Refilled MS Contin 30 mg daily #30 and Oxycodone 10/325 mg one tablet every 8 hours as needed for pain #60 Second script given. 2. DM2: PCP Following  20 minutes of face to face patient care time waspent during this visit. All questions were encouraged and answered.   F/U in 2 months

## 2014-10-09 ENCOUNTER — Telehealth: Payer: Self-pay | Admitting: Physical Medicine & Rehabilitation

## 2014-10-09 NOTE — Telephone Encounter (Signed)
I will continue to write narcotics for now but any further positive tests for illicit drugs will mean non-narcotic management only.

## 2014-10-09 NOTE — Telephone Encounter (Signed)
-----   Message from [facility], RN sent at 10/07/2014  2:32 PM EST ----- Positive for low level THC and negative for all prescribed meds even though he says that he took it on the day of the test, therefore INCONSISTENT

## 2014-10-10 NOTE — Telephone Encounter (Signed)
Left message on [name]'s voicemail on his mobile # as well as with his wife for him to call the office and ask to speak with me so that I can discuss his urine drug screen results.

## 2014-10-11 NOTE — Telephone Encounter (Signed)
I informed [name] about the [facility] in his urine drug screen and that Dr Naaman Plummer will continue to write narcotics for him at this point but if THC+ in future UDS he will not prescribe.  Tyrone Guardia acknowledged understanding of the warning.

## 2014-10-27 ENCOUNTER — Other Ambulatory Visit: Payer: Self-pay | Admitting: Internal Medicine

## 2014-10-27 DIAGNOSIS — I10 Essential (primary) hypertension: Secondary | ICD-10-CM

## 2014-10-27 DIAGNOSIS — E119 Type 2 diabetes mellitus without complications: Secondary | ICD-10-CM

## 2014-11-07 ENCOUNTER — Encounter (HOSPITAL_COMMUNITY): Payer: Self-pay | Admitting: Cardiology

## 2014-11-17 ENCOUNTER — Other Ambulatory Visit: Payer: Self-pay | Admitting: Internal Medicine

## 2014-11-19 ENCOUNTER — Ambulatory Visit (HOSPITAL_BASED_OUTPATIENT_CLINIC_OR_DEPARTMENT_OTHER): Payer: No Typology Code available for payment source | Admitting: Physical Medicine & Rehabilitation

## 2014-11-19 ENCOUNTER — Other Ambulatory Visit: Payer: Self-pay | Admitting: Physical Medicine & Rehabilitation

## 2014-11-19 ENCOUNTER — Encounter: Payer: Self-pay | Admitting: Physical Medicine & Rehabilitation

## 2014-11-19 ENCOUNTER — Encounter: Payer: No Typology Code available for payment source | Attending: Registered Nurse

## 2014-11-19 VITALS — BP 124/75 | HR 66 | Resp 14

## 2014-11-19 DIAGNOSIS — E118 Type 2 diabetes mellitus with unspecified complications: Secondary | ICD-10-CM | POA: Diagnosis not present

## 2014-11-19 DIAGNOSIS — Z79899 Other long term (current) drug therapy: Secondary | ICD-10-CM

## 2014-11-19 DIAGNOSIS — M5416 Radiculopathy, lumbar region: Secondary | ICD-10-CM | POA: Insufficient documentation

## 2014-11-19 DIAGNOSIS — Z760 Encounter for issue of repeat prescription: Secondary | ICD-10-CM | POA: Insufficient documentation

## 2014-11-19 DIAGNOSIS — M47816 Spondylosis without myelopathy or radiculopathy, lumbar region: Secondary | ICD-10-CM

## 2014-11-19 DIAGNOSIS — G8929 Other chronic pain: Secondary | ICD-10-CM | POA: Diagnosis not present

## 2014-11-19 DIAGNOSIS — Z5181 Encounter for therapeutic drug level monitoring: Secondary | ICD-10-CM

## 2014-11-19 DIAGNOSIS — M545 Low back pain: Secondary | ICD-10-CM | POA: Insufficient documentation

## 2014-11-19 DIAGNOSIS — M5136 Other intervertebral disc degeneration, lumbar region: Secondary | ICD-10-CM | POA: Diagnosis not present

## 2014-11-19 DIAGNOSIS — M5417 Radiculopathy, lumbosacral region: Secondary | ICD-10-CM

## 2014-11-19 DIAGNOSIS — M47817 Spondylosis without myelopathy or radiculopathy, lumbosacral region: Secondary | ICD-10-CM

## 2014-11-19 MED ORDER — OXYCODONE-ACETAMINOPHEN 10-325 MG PO TABS: 1.0000 | ORAL_TABLET | Freq: Three times a day (TID) | ORAL | Status: DC | PRN

## 2014-11-19 MED ORDER — MORPHINE SULFATE ER 30 MG PO TBCR: 30.0000 mg | EXTENDED_RELEASE_TABLET | Freq: Every day | ORAL | Status: DC

## 2014-11-19 NOTE — Progress Notes (Signed)
  PROCEDURE RECORD [facility]   Name: [name] DOB:1956/10/02 MRN: [id number]  Date:11/19/2014  Physician: Alysia Penna, MD    Nurse/CMA: Marybeth Ginkel  Allergies: No Known Allergies  Consent Signed: Yes.    Is patient diabetic? Yes.    CBG today? unsure  Pregnant: No. LMP: No LMP for male patient. (age 58-55)  Anticoagulants: no Anti-inflammatory: no Antibiotics: no  Procedure: Position: Prone Start Time:  12:48 End Time: 12:53 Fluoro Time: 12  RN/CMA Alessandra Grout Ginkel    Time 12:00 PM 12:58    BP 124/75 130/70    Pulse 66 62    Respirations 14 14    O2 Sat 95 98    S/S 6 6    Pain Level 5/10  3/10     D/C home with [name], patient A & O X 3, D/C instructions reviewed, and sits independently.

## 2014-11-19 NOTE — Progress Notes (Signed)
Lumbar epidural steroid injection under fluoroscopic guidance Right L5-S1 paramedian  Indication: Lumbosacral radiculitis is not relieved by medication management or other conservative care and interfering with self-care and mobility.Okayed by cardiology to be off of Xarelto for 5 days prior to the procedure. Patient states it has been 6 days.  Informed consent was obtained after describing risk and benefits of the procedure with the patient, this includes bleeding, bruising, infection, paralysis and medication side effects.  The patient wishes to proceed and has given written consent.  Patient was placed in a prone position.  The lumbar area was marked and prepped with Betadine.  It was entered with a 25-gauge 1-1/2 inch needle and one mL of 1% lidocaine was injected into the skin and subcutaneous tissue.  Then a 17-gauge spinal needle was inserted under fluoroscopic guidance into the L5-S1 interlaminar space under AP and Lateral imaging.  Once needle tip of approximated the posterior elements, a loss of resistance technique was utilized with lateral imaging.  A positive loss of resistance was obtained and then confirmed by injecting 2 mL's of Omnipaque 180.  Then a solution containing 1.5 mL's of 40 mg per mL depomedrol and 1.5 mL's of 1% lidocaine was injected.  The patient tolerated procedure well.  Post procedure instructions were given.  Please see post procedure form. May resume his Xarelto tomorrow   Patient states last pain medication taken yesterday. Did not bring pill bottles in today. Urine drug screen in October showed positive THC with no evidence of narcotic analgesics. We will repeat today. Dr. Naaman Plummer to follow-up on this

## 2014-11-19 NOTE — Addendum Note (Signed)
Addended by: Valeria Batman on: 11/19/2014 01:23 PM   Modules accepted: Orders

## 2014-11-19 NOTE — Patient Instructions (Signed)
May resume Xarelto tomorrow around noon

## 2014-11-20 ENCOUNTER — Telehealth: Payer: Self-pay | Admitting: *Deleted

## 2014-11-20 DIAGNOSIS — E785 Hyperlipidemia, unspecified: Secondary | ICD-10-CM

## 2014-11-20 LAB — PMP ALCOHOL METABOLITE (ETG)

## 2014-11-20 MED ORDER — PRAVASTATIN SODIUM 40 MG PO TABS
40.0000 mg | ORAL_TABLET | Freq: Every day | ORAL | Status: DC
Start: 2014-11-20 — End: 2015-12-22

## 2014-11-20 NOTE — Telephone Encounter (Signed)
[name] has been on Pravastatin 80mg  daily. His last lipid panel was in June '15 and his cholesterol was well controlled, almost too well. Total cholesterol 101 and LDL 56. - Refilling his Pravastatin but decreasing the dose to 40mg  daily - Please call him to let him know that the dose has been decreased. Thanks!

## 2014-11-20 NOTE — Telephone Encounter (Signed)
Received fax from [facility] for refill on Pravastatin; do not see dx for refill on Problem list.   Pravastatin 80 mg -take 1 tab daily Thanks

## 2014-11-20 NOTE — Telephone Encounter (Signed)
Pt called and message left about new rx for Pravastatin 40 mg per Dr Eulas Post.

## 2014-11-25 LAB — OPIATES/OPIOIDS (LC/MS-MS)
CODEINE URINE: NEGATIVE ng/mL (ref <50)
Hydrocodone: NEGATIVE ng/mL (ref <50)
Hydromorphone: NEGATIVE ng/mL — AB (ref <50)
Morphine Urine: NEGATIVE ng/mL — AB (ref <50)
NORHYDROCODONE, UR: NEGATIVE ng/mL (ref <50)
Noroxycodone, Ur: 407 ng/mL (ref <50)
OXYCODONE, UR: 97 ng/mL (ref <50)
OXYMORPHONE, URINE: 112 ng/mL (ref <50)

## 2014-11-25 LAB — ETHYL GLUCURONIDE, URINE
ETGU: 30882 ng/mL — AB (ref <500)
ETHYL SULFATE (ETS): 9439 ng/mL — AB (ref <100)

## 2014-11-25 LAB — OXYCODONE, URINE (LC/MS-MS)
Noroxycodone, Ur: 407 ng/mL (ref <50)
OXYMORPHONE, URINE: 112 ng/mL (ref <50)
Oxycodone, ur: 97 ng/mL (ref <50)

## 2014-11-27 LAB — PRESCRIPTION MONITORING PROFILE (SOLSTAS)
AMPHETAMINE/METH: NEGATIVE ng/mL
BUPRENORPHINE, URINE: NEGATIVE ng/mL
Barbiturate Screen, Urine: NEGATIVE ng/mL
Benzodiazepine Screen, Urine: NEGATIVE ng/mL
CANNABINOID SCRN UR: NEGATIVE ng/mL
COCAINE METABOLITES: NEGATIVE ng/mL
Carisoprodol, Urine: NEGATIVE ng/mL
Creatinine, Urine: 177.64 mg/dL (ref >20.0)
FENTANYL URINE: NEGATIVE ng/mL
MDMA URINE: NEGATIVE ng/mL
METHADONE SCREEN, URINE: NEGATIVE ng/mL
Meperidine, Ur: NEGATIVE ng/mL
Nitrites, Initial: NEGATIVE ug/mL
Propoxyphene: NEGATIVE ng/mL
TAPENTADOLUR: NEGATIVE ng/mL
Tramadol Scrn, Ur: NEGATIVE ng/mL
Zolpidem, Urine: NEGATIVE ng/mL
pH, Initial: 5.8 pH (ref 4.5–8.9)

## 2014-12-04 ENCOUNTER — Telehealth: Payer: Self-pay | Admitting: *Deleted

## 2014-12-04 NOTE — Telephone Encounter (Signed)
Urine drug screen for 11/19/14 visit was consistent for meds but positive for ETOH.  Prior UDS was +THC, which is negative this time.

## 2014-12-04 NOTE — Progress Notes (Signed)
Urine drug screen for this encounter is consistent for prescribed medication.  It is positive for ETOH as well.  Previous UDS had THC.

## 2014-12-04 NOTE — Telephone Encounter (Signed)
i am sorry (because i like him) but he is discharged from the clinic due to 2 failed UDS's

## 2014-12-11 ENCOUNTER — Telehealth: Payer: Self-pay | Admitting: *Deleted

## 2014-12-11 DIAGNOSIS — M47817 Spondylosis without myelopathy or radiculopathy, lumbosacral region: Secondary | ICD-10-CM

## 2014-12-11 DIAGNOSIS — M47816 Spondylosis without myelopathy or radiculopathy, lumbar region: Secondary | ICD-10-CM

## 2014-12-11 DIAGNOSIS — E785 Hyperlipidemia, unspecified: Secondary | ICD-10-CM

## 2014-12-11 MED ORDER — OXYCODONE-ACETAMINOPHEN 10-325 MG PO TABS: 1.0000 | ORAL_TABLET | Freq: Three times a day (TID) | ORAL | Status: DC | PRN

## 2014-12-11 MED ORDER — MORPHINE SULFATE ER 30 MG PO TBCR
30.0000 mg | EXTENDED_RELEASE_TABLET | Freq: Every day | ORAL | Status: DC
Start: 2014-12-11 — End: 2015-02-05

## 2014-12-12 NOTE — Telephone Encounter (Signed)
[name] returned my call.  I explained to him that unfortunately since his urine drug screen was positive for alcohol and previous test had THC, Dr Naaman Plummer is discharging him from the clinic.  He will receive a final rx for his pain medication that he can pick up tomorrow and a letter with list of area pain clinics.

## 2014-12-12 NOTE — Telephone Encounter (Signed)
Left message with Hassan Rowan to ask Mr Wlodarczyk to call the office.  I am calling to discuss UDS results and discharge from clinic.

## 2014-12-13 ENCOUNTER — Telehealth: Payer: Self-pay | Admitting: *Deleted

## 2014-12-13 NOTE — Telephone Encounter (Signed)
Informed Mr Haithcock his rx is ready.

## 2014-12-13 NOTE — Telephone Encounter (Signed)
Patient called and asked if his RX was ready - also do we have information on other pain clinics for him... Please call back

## 2014-12-20 ENCOUNTER — Ambulatory Visit: Payer: No Typology Code available for payment source | Admitting: Registered Nurse

## 2015-01-19 NOTE — Telephone Encounter (Signed)
Na

## 2015-02-05 ENCOUNTER — Ambulatory Visit (INDEPENDENT_AMBULATORY_CARE_PROVIDER_SITE_OTHER): Payer: 59 | Admitting: Internal Medicine

## 2015-02-05 VITALS — BP 147/76 | HR 73 | Temp 97.8°F | Ht 75.0 in | Wt 287.7 lb

## 2015-02-05 DIAGNOSIS — H6092 Unspecified otitis externa, left ear: Secondary | ICD-10-CM

## 2015-02-05 DIAGNOSIS — M47817 Spondylosis without myelopathy or radiculopathy, lumbosacral region: Secondary | ICD-10-CM

## 2015-02-05 DIAGNOSIS — E1165 Type 2 diabetes mellitus with hyperglycemia: Secondary | ICD-10-CM

## 2015-02-05 DIAGNOSIS — I10 Essential (primary) hypertension: Secondary | ICD-10-CM

## 2015-02-05 DIAGNOSIS — K047 Periapical abscess without sinus: Secondary | ICD-10-CM | POA: Insufficient documentation

## 2015-02-05 DIAGNOSIS — M47897 Other spondylosis, lumbosacral region: Secondary | ICD-10-CM

## 2015-02-05 DIAGNOSIS — IMO0002 Reserved for concepts with insufficient information to code with codable children: Secondary | ICD-10-CM

## 2015-02-05 DIAGNOSIS — L210 Seborrhea capitis: Secondary | ICD-10-CM

## 2015-02-05 DIAGNOSIS — Z7901 Long term (current) use of anticoagulants: Secondary | ICD-10-CM

## 2015-02-05 DIAGNOSIS — F17210 Nicotine dependence, cigarettes, uncomplicated: Secondary | ICD-10-CM

## 2015-02-05 DIAGNOSIS — L219 Seborrheic dermatitis, unspecified: Secondary | ICD-10-CM

## 2015-02-05 DIAGNOSIS — I2510 Atherosclerotic heart disease of native coronary artery without angina pectoris: Secondary | ICD-10-CM

## 2015-02-05 LAB — GLUCOSE, CAPILLARY: GLUCOSE-CAPILLARY: 142 mg/dL — AB (ref 70–99)

## 2015-02-05 LAB — POCT GLYCOSYLATED HEMOGLOBIN (HGB A1C): Hemoglobin A1C: 6.5

## 2015-02-05 MED ORDER — LISINOPRIL 5 MG PO TABS: 5.0000 mg | ORAL_TABLET | Freq: Every day | ORAL | Status: DC

## 2015-02-05 MED ORDER — CIPROFLOXACIN-HYDROCORTISONE 0.2-1 % OT SUSP: 3.0000 [drp] | Freq: Two times a day (BID) | OTIC | Status: AC

## 2015-02-05 MED ORDER — AMOXICILLIN-POT CLAVULANATE 875-125 MG PO TABS: 1.0000 | ORAL_TABLET | Freq: Two times a day (BID) | ORAL | Status: AC

## 2015-02-05 MED ORDER — TRIAMCINOLONE ACETONIDE 0.1 % EX LOTN: TOPICAL_LOTION | Freq: Two times a day (BID) | CUTANEOUS | Status: DC

## 2015-02-05 NOTE — Progress Notes (Addendum)
Patient ID: [name], male   DOB: 12-21-1955, 59 y.o.   MRN: [id number]  Subjective:   Patient ID: [name] male   DOB: 1956-06-02 59 y.o.   MRN: [id number]  HPI: [name] is a 59 y.o. M w/ PMH DM2, HTN, NSTEMI, A. Fib, and chronic back pain presents for a routine f/u for his diabetes and HTN.  He has not been to see me since June '15. He was previously seen by Pain Management by Dr. Naaman Plummer but was discharged due to 2 failed UDSs with positive EtOH and THC, which he states happened over the holidays but he does not drink or do drugs normally. He is requesting another referral to pain management.  He is taking all of his medications. He is not checking his CBGs at home. CBG in the clinic today was 142.  He denies fevers, chills, vision changes, headaches, chest pain, palpitations, SOB, abd pain, N/V/D/C, trouble or pain with urination, trouble ambulating or peripheral edema.   Past Medical History  Diagnosis Date  . TOBACCO ABUSE 11/25/2006  . CORONARY ARTERY DISEASE 11/25/2006    Cardiac cath by Dr Terrence Dupont 11/2003 LV showed good LV systolic function, EF of 97-58%. Left main was patent. LAD has 20-30% mid stenosis. Diagonal 1 and diagonal 2 were patent. Left circumflex was patent. OM1 was less than 0.5 mm which was diffusely diseased. OM2 has 20% ostial stenosis which was patent which  was moderate size. OM3 was patent at prior PTCA and stented site. RCA has 20-30% proximal   . Hypertension   . Atrial arrhythmia   . Type II diabetes mellitus   . Cellulitis of knee, right 06/06/12  . Chronic otitis externa 06/06/12    C Multiple trials with antibiocs and antifungal. Cultures were positive for Pseudomonas. Follow up with ENT on 06/14/12    . NSTEMI (non-ST elevated myocardial infarction) 08/14/2012    S/p Successful PTCA to proximal OM-3      Current Outpatient Prescriptions  Medication Sig Dispense Refill  . aspirin EC 81 MG tablet Take 1 tablet (81 mg total) by mouth daily. 90  tablet 3  . fluocinonide cream (LIDEX) 8.32 % Apply 1 application topically 2 (two) times daily. Apply a thin layer 2 times a day to the skin eruptions on your leg 30 g 1  . glipiZIDE (GLUCOTROL) 10 MG tablet TAKE ONE TABLET BY MOUTH ONCE DAILY 90 tablet 3  . ibuprofen (ADVIL,MOTRIN) 800 MG tablet     . isosorbide mononitrate (IMDUR) 30 MG 24 hr tablet TAKE ONE TABLET BY MOUTH ONCE DAILY 90 tablet 3  . lisinopril (PRINIVIL,ZESTRIL) 5 MG tablet Take 1 tablet (5 mg total) by mouth daily. 90 tablet 3  . metFORMIN (GLUCOPHAGE) 1000 MG tablet TAKE ONE TABLET BY MOUTH TWICE DAILY WITH MEALS 180 tablet 0  . NITROSTAT 0.4 MG SL tablet DISSOLVE ONE TABLET UNDER THE TONGUE EVERY 5 MINUTES AS NEEDED FOR CHEST PAIN.  DO NOT EXCEED A TOTAL OF 3 DOSES IN 15 MINUTES 30 tablet 0  . pravastatin (PRAVACHOL) 40 MG tablet Take 1 tablet (40 mg total) by mouth daily. 90 tablet 3  . sotalol (BETAPACE) 80 MG tablet TAKE ONE-HALF TABLET BY MOUTH TWICE DAILY 180 tablet 0  . XARELTO 20 MG TABS tablet Take 20 mg by mouth daily with supper.     . zolpidem (AMBIEN CR) 12.5 MG CR tablet Take 1 tablet (12.5 mg total) by mouth at bedtime as needed for sleep.  30 tablet 3  . acetaminophen (TYLENOL) 500 MG tablet Take 1 tablet (500 mg total) by mouth every 8 (eight) hours as needed for fever. (Patient not taking: Reported on 02/05/2015) 30 tablet 0  . amoxicillin-clavulanate (AUGMENTIN) 875-125 MG per tablet Take 1 tablet by mouth every 12 (twelve) hours. 14 tablet 0  . Benzocaine-Menthol (CEPACOL SORE THROAT) 10-2.1 MG LOZG Use as directed 1 lozenge in the mouth or throat 3 (three) times daily as needed (sore throat). (Patient not taking: Reported on 02/05/2015) 18 each 0  . Blood Glucose Monitoring Suppl (ONE TOUCH ULTRA MINI) W/DEVICE KIT Check blood sugar 3x a day dx code 250.00 (Patient not taking: Reported on 02/05/2015) 300 each 3  . ciprofloxacin-hydrocortisone (CIPRO HC) otic suspension Place 3 drops into the right ear 2 (two) times  daily. 10 mL 0  . glucose blood (ONE TOUCH ULTRA TEST) test strip Check blood sugar 3x a day dx code 250.00 (Patient not taking: Reported on 02/05/2015) 300 each 3  . methocarbamol (ROBAXIN) 500 MG tablet Take 1 tablet (500 mg total) by mouth every 6 (six) hours as needed for muscle spasms. (Patient not taking: Reported on 02/05/2015) 90 tablet 3  . ONETOUCH DELICA LANCETS FINE MISC 1 each by Does not apply route 3 (three) times daily. Check blood sugar 3x a day dx code 250.00 (Patient not taking: Reported on 02/05/2015) 300 each 3  . selenium sulfide (SELSUN) 2.5 % shampoo     . triamcinolone lotion (KENALOG) 0.1 % Apply topically 2 (two) times daily. 60 mL 1   No current facility-administered medications for this visit.   No family history on file. History   Social History  . Marital Status: Married    Spouse Name: N/A  . Number of Children: N/A  . Years of Education: N/A   Social History Main Topics  . Smoking status: Current Some Day Smoker -- 0.20 packs/day for 12 years    Types: Cigarettes    Last Attempt to Quit: 07/26/1979  . Smokeless tobacco: Never Used  . Alcohol Use: No  . Drug Use: No  . Sexual Activity: Yes   Other Topics Concern  . Not on file   Social History Narrative   Review of Systems: A 12 point ROS was performed; pertinent positives and negatives were noted in the HPI  Objective:  Physical Exam: Filed Vitals:   02/05/15 1538  BP: 147/76  Pulse: 73  Temp: 97.8 F (36.6 C)  TempSrc: Oral  Height: $Remove'6\' 3"'iIGlpuP$  (1.905 m)  Weight: 287 lb 11.2 oz (130.5 kg)  SpO2: 98%   Constitutional: Vital signs reviewed.  Patient is a well-developed and well-nourished male in no acute distress and cooperative with exam. Head: Normocephalic and atraumatic. Seborrhea capitus present. Ear: TM normal bilaterally. Erythema of pinna and external auditory canal with scaling of the skin and some bleeding. No significant drainage noted. No hearing loss.  Nose: No erythema or drainage  noted.  Turbinates normal Mouth: no erythema or exudates, MMM. Darkening at the tooth base on the bottom left molar, tooth lose and tender.  Eyes: PERRL, EOMI. No scleral icterus.  Neck: Normal ROM Cardiovascular: RRR, no MRG, pulses symmetric and intact bilaterally Pulmonary/Chest: Normal respiratory effort, CTAB, no wheezes, rales, or rhonchi Abdominal: Soft. Non-tender, non-distended, bowel sounds are normal, no guarding present.  Musculoskeletal: +stiffness and pain in lumbosacral spine Neurological: A&O x3, Strength is normal and symmetric bilaterally, cranial nerve II-XII are grossly intact, no focal motor deficit, sensory intact to  light touch bilaterally.  Skin: Warm, dry and intact. Psychiatric: Normal mood and affect. Speech and behavior is normal.   Assessment & Plan:   Please refer to Problem List based Assessment and Plan

## 2015-02-05 NOTE — Assessment & Plan Note (Addendum)
Pt out of triamcinolone. Refilling. Recommended following back with Dermatology.

## 2015-02-05 NOTE — Assessment & Plan Note (Signed)
Lab Results  Component Value Date   HGBA1C 6.5 02/05/2015   HGBA1C 6.2 09/12/2014   HGBA1C 6.7 05/22/2014     Assessment: Diabetes control: good control (HgbA1C at goal) Progress toward A1C goal:  at goal Comments: Pt on glipizide 10mg  daily and Metformin 1000mg  BID  Plan: Medications:  continue current medications Home glucose monitoring: Frequency: no home glucose monitoring Timing:   Instruction/counseling given: reminded to bring blood glucose meter & log to each visit, reminded to bring medications to each visit and discussed the need for weight loss Other plans: A1c in 3 mo

## 2015-02-05 NOTE — Assessment & Plan Note (Addendum)
Left ear very tender with erythema, scaling of the skin with some bleeding. No significant drainage noted. Pt with h/o otitis externa of the left ear. Previously sensitive to Cipro. Will treat with topical antibiotics as they have been shown to be more effective than systemic in the setting of otitis externa.  - Cipro 0.2% otic solution, 3 drops in the left ear BID x7 days - Return to the clinic in 1 week  - If infection is not resolved, will need to refer to ENT, h/o refractory infection

## 2015-02-05 NOTE — Assessment & Plan Note (Signed)
Pt discharged from pain management with Dr. Naaman Plummer due to UDS + for EtOH and THC. Pt states that he smoked marijuana and drank EtOH over the Christmas holiday and does not use either otherwise. I do believe him and do not think he is using. Pt requesting referral to another pain specialist.  - Checking UDS here in clinic today - Will refer to another pain specialist: his new insurance [employer] recommends Dr. Brandy Hale or Dr. Nicholaus Bloom.

## 2015-02-05 NOTE — Assessment & Plan Note (Addendum)
BP Readings from Last 3 Encounters:  02/05/15 147/76  11/19/14 124/75  09/26/14 138/80    Lab Results  Component Value Date   NA 139 05/10/2013   K 4.3 05/10/2013   CREATININE 0.65 05/10/2013    Assessment: Blood pressure control: mildly elevated Progress toward BP goal:  deteriorated (out of lisinopril)   Plan: Medications:  continue current medications:Imdur and Sotalol and restarting low dose lisinopril 5mg  daily Other plans: Recheck BP at next visit

## 2015-02-05 NOTE — Progress Notes (Signed)
Medicine attending: Medical history, presenting problems, physical findings, and medications, reviewed with Dr Kathryn Glenn and I concur with her evaluation and management plan. 

## 2015-02-05 NOTE — Assessment & Plan Note (Signed)
Pt doing well. No chest pain. He changed insurance and needs a new referral to see Dr. Terrence Dupont. - Referral for Cardiology placed today for Dr. Terrence Dupont

## 2015-02-05 NOTE — Assessment & Plan Note (Signed)
Pt c/o tooth pain in right lower molar. Darkening of the tooth at the gum line is present w/o exceptional erythema or drainage; however the tooth is loose and tender. He is unable to see the Dentist until he is cleared by his Cardiologist, Dr. Terrence Dupont, and he is on Xarelto and has been told that he has to be off the Xarelto for any extractions.  - Will treat for infection with Augmentin 875-125mg  po q12h x7 days

## 2015-02-05 NOTE — Patient Instructions (Addendum)
Use the Cipro ear drops 3 drops in the left ear 2 times a day for 7 days. Come back to the clinic in 1 week for a follow up.  Begin taking the Augmentin 875mg  every 12 hours for your tooth infection.   Be sure to restart the lisinopril.  Work on exercising!  General Instructions:   Thank you for bringing your medicines today. This helps [place] keep you safe from mistakes.   Progress Toward Treatment Goals:  Treatment Goal 09/12/2014  Hemoglobin A1C at goal  Blood pressure at goal    Self Care Goals & Plans:  Self Care Goal 09/12/2014  Manage my medications take my medicines as prescribed; bring my medications to every visit; refill my medications on time; follow the sick day instructions if I am sick  Monitor my health keep track of my weight; check my feet daily  Eat healthy foods eat more vegetables; eat fruit for snacks and desserts; eat foods that are low in salt; eat baked foods instead of fried foods; drink diet soda or water instead of juice or soda  Be physically active find an activity I enjoy  Other -    Home Blood Glucose Monitoring 09/12/2014  Check my blood sugar once a day  When to check my blood sugar -     Care Management & Community Referrals:  Referral 10/04/2013  Referrals made for care management support none needed

## 2015-02-05 NOTE — Assessment & Plan Note (Deleted)
Pt not taking his insulin per his brother and is not checking CBGs at home but is making up number for his log book. He continues to eat and drink whatever he wants, especially sodas and sweat tea. CBG in the clinic is reading "HI". Pt very thirsty and is requesting water. - Checking STAT BMP, CBC and Mg (in setting of low-normal Mg)

## 2015-02-06 ENCOUNTER — Encounter: Payer: No Typology Code available for payment source | Admitting: Internal Medicine

## 2015-02-06 LAB — PRESCRIPTION ABUSE MONITORING 15P, URINE
Amphetamine/Meth: NEGATIVE ng/mL
BENZODIAZEPINE SCREEN, URINE: NEGATIVE ng/mL
Barbiturate Screen, Urine: NEGATIVE ng/mL
Buprenorphine, Urine: NEGATIVE ng/mL
CARISOPRODOL, URINE: NEGATIVE ng/mL
COCAINE METABOLITES: NEGATIVE ng/mL
Cannabinoid Scrn, Ur: NEGATIVE ng/mL
Creatinine, Urine: 142.41 mg/dL (ref >20.0)
Fentanyl, Ur: NEGATIVE ng/mL
MEPERIDINE UR: NEGATIVE ng/mL
Methadone Screen, Urine: NEGATIVE ng/mL
OPIATE SCREEN, URINE: NEGATIVE ng/mL
Oxycodone Screen, Ur: NEGATIVE ng/mL
PROPOXYPHENE: NEGATIVE ng/mL
Tramadol Scrn, Ur: NEGATIVE ng/mL
Zolpidem, Urine: NEGATIVE ng/mL

## 2015-02-14 ENCOUNTER — Encounter: Payer: Self-pay | Admitting: Internal Medicine

## 2015-02-14 ENCOUNTER — Ambulatory Visit (INDEPENDENT_AMBULATORY_CARE_PROVIDER_SITE_OTHER): Payer: 59 | Admitting: Internal Medicine

## 2015-02-14 VITALS — BP 130/71 | HR 64 | Temp 98.1°F | Wt 289.8 lb

## 2015-02-14 DIAGNOSIS — I10 Essential (primary) hypertension: Secondary | ICD-10-CM

## 2015-02-14 DIAGNOSIS — K047 Periapical abscess without sinus: Secondary | ICD-10-CM

## 2015-02-14 DIAGNOSIS — H6092 Unspecified otitis externa, left ear: Secondary | ICD-10-CM

## 2015-02-14 NOTE — Patient Instructions (Signed)
It was a pleasure to see you today. You can stop doing the ear drops for your infection. We did not make any other changes to your medicines today. Please go to a dentist. Please return to clinic or seek medical attention if you have any new or worsening ear pain, ear discharge, changes in hearing, or other worrisome medical condition. We look forward to seeing you again as needed.  Lottie Mussel, MD  General Instructions:   Please try to bring all your medicines next time. This will help [place] keep you safe from mistakes.   Progress Toward Treatment Goals:  Treatment Goal 02/14/2015  Hemoglobin A1C -  Blood pressure at goal    Self Care Goals & Plans:  Self Care Goal 09/12/2014  Manage my medications take my medicines as prescribed; bring my medications to every visit; refill my medications on time; follow the sick day instructions if I am sick  Monitor my health keep track of my weight; check my feet daily  Eat healthy foods eat more vegetables; eat fruit for snacks and desserts; eat foods that are low in salt; eat baked foods instead of fried foods; drink diet soda or water instead of juice or soda  Be physically active find an activity I enjoy  Other -    Home Blood Glucose Monitoring 02/05/2015  Check my blood sugar no home glucose monitoring  When to check my blood sugar -     Care Management & Community Referrals:  Referral 10/04/2013  Referrals made for care management support none needed

## 2015-02-14 NOTE — Assessment & Plan Note (Signed)
[name] has responded well to the cipro 0.2% otic solution. His pain has nearly resolved. He has some scaling of the inner portion of the outer ear but he reports that it is "night and day" better than before. He is very satisfied with the improvement in his ear symptoms.  -d/c cipro otic solution -RTC if worsening symptoms

## 2015-02-14 NOTE — Assessment & Plan Note (Signed)
BP Readings from Last 3 Encounters:  02/14/15 130/71  02/05/15 147/76  11/19/14 124/75    Lab Results  Component Value Date   NA 139 05/10/2013   K 4.3 05/10/2013   CREATININE 0.65 05/10/2013    Assessment: Blood pressure control: controlled Progress toward BP goal:  at goal Comments: Well controlled since restarting lisinopril 5 mg daily. Also on imdur 30 mg daily, sotalol 40 mg bid  Plan: Medications:  continue current medications Educational resources provided:   Self management tools provided:   Other plans: none

## 2015-02-14 NOTE — Assessment & Plan Note (Addendum)
[name] has a tooth ache in his R back bottom molar. He has had it for a month. He has not seen a dentist since last year when he last had dental insurance. He has poor dentition on exam with obvious gum retraction and several cavities. He just completed one week of augmentin with no difference noticed in symptoms. We discussed treatment options. [facility] has a clinic for Medicare only patients but he does not qualify. I discussed the dental orange card and he said he would not be interested in the process of applying for it and disclosing his income. It appears he may have [place] but he thinks it expired last year. I told him to call the number on his insurance card to clarify. He says he will also consider saving up his money for the dental work. -go to [facility], community -once procedure date set by dentist, call PCP to discuss [place] and dental work

## 2015-02-14 NOTE — Progress Notes (Signed)
   Subjective:    Patient ID: [name], male    DOB: 02/09/1956, 59 y.o.   MRN: [id number]  HPI  [name] is a 59 year old man with DM2, HTN, NSTEMI, a fib, and chronic back pain here for follow-up on his otitis externa and tooth problem.  He was seen in [facility] 3/9 for otitis externa. His L ear was tender with erythema, scaling of the skin and bleeding without drainage. He has a history of otitis externa and was given cipro 0.2% otic solution 3 drops L ear BID x 7 days. He was also restarted on lisinopril 5 mg daily for his BP (in addition to his imdur 30 mg daily and sotalol 40 mg bid). He was also given 7 d of augmentin 875-125 mg q12h for his R lower molar.  Since that visit, he has been compliant with his drops and thinks his ear is much better. He says the ear appears much better but the pain is minimal but still present. He reiterates that it is much better than before.  He thinks that his R bottom molar is not any better. He has been compliant with the augmentin does not think it has changed. He describes the pain as throbbing for the past month. He went to the dentist a year ago when he had dental insurance but it has since ran out. He has not spoken to the dentist about what the cost would be out of pocket for dental work.  Review of Systems  Constitutional: Negative for fever, chills and diaphoresis.  HENT: Positive for dental problem and ear pain. Negative for ear discharge and hearing loss.   Respiratory: Negative for shortness of breath.   Cardiovascular: Negative for chest pain.  Gastrointestinal: Negative for nausea, vomiting, abdominal pain, diarrhea and constipation.  Neurological: Negative for weakness, light-headedness, numbness and headaches.       Objective:   Physical Exam  Constitutional: He is oriented to person, place, and time. He appears well-developed and well-nourished. No distress.  HENT:  Head: Normocephalic and atraumatic.  Right Ear: External ear normal.    Mouth/Throat: No oropharyngeal exudate.  L ear with minimal tenderness on inner portion of outer ear. There is still some dry scaling of the inner portion of the outer ear but no bleeding, drainage (he says it looks much improved). Mouth with very poor dentition (numerous cavities) and several teeth missing. The bottom R backmost molar has severe gum retraction and cavities on molar surface. He had tenderness of tooth with tongue depressor during exam  Eyes: EOM are normal. Pupils are equal, round, and reactive to light.  Cardiovascular: Normal rate, regular rhythm, normal heart sounds and intact distal pulses.  Exam reveals no gallop and no friction rub.   No murmur heard. Pulmonary/Chest: Effort normal and breath sounds normal. No respiratory distress. He has no wheezes.  Neurological: He is alert and oriented to person, place, and time.  Skin: He is not diaphoretic.  Vitals reviewed.         Assessment & Plan:

## 2015-02-17 NOTE — Progress Notes (Signed)
Internal Medicine Clinic Attending  Case discussed with Dr. Rothman at the time of the visit.  We reviewed the resident's history and exam and pertinent patient test results.  I agree with the assessment, diagnosis, and plan of care documented in the resident's note. 

## 2015-03-05 ENCOUNTER — Other Ambulatory Visit: Payer: Self-pay | Admitting: *Deleted

## 2015-03-06 MED ORDER — SELENIUM SULFIDE 2.5 % EX LOTN: TOPICAL_LOTION | Freq: Every day | CUTANEOUS | Status: DC | PRN

## 2015-03-10 ENCOUNTER — Ambulatory Visit (INDEPENDENT_AMBULATORY_CARE_PROVIDER_SITE_OTHER): Payer: 59 | Admitting: Internal Medicine

## 2015-03-10 ENCOUNTER — Encounter: Payer: Self-pay | Admitting: Internal Medicine

## 2015-03-10 VITALS — BP 142/67 | HR 63 | Temp 97.7°F | Ht 76.0 in | Wt 287.5 lb

## 2015-03-10 DIAGNOSIS — H6092 Unspecified otitis externa, left ear: Secondary | ICD-10-CM

## 2015-03-10 DIAGNOSIS — R600 Localized edema: Secondary | ICD-10-CM | POA: Diagnosis not present

## 2015-03-10 LAB — COMPREHENSIVE METABOLIC PANEL
ALT: 21 U/L (ref 0–53)
ANION GAP: 11 (ref 5–15)
AST: 19 U/L (ref 0–37)
Albumin: 3.3 g/dL — ABNORMAL LOW (ref 3.5–5.2)
Alkaline Phosphatase: 64 U/L (ref 39–117)
BUN: 13 mg/dL (ref 6–23)
CALCIUM: 8.8 mg/dL (ref 8.4–10.5)
CO2: 25 mmol/L (ref 19–32)
Chloride: 101 mmol/L (ref 96–112)
Creatinine, Ser: 0.85 mg/dL (ref 0.50–1.35)
GFR calc non Af Amer: >90 mL/min (ref >90)
GLUCOSE: 154 mg/dL — AB (ref 70–99)
Potassium: 4.2 mmol/L (ref 3.5–5.1)
SODIUM: 137 mmol/L (ref 135–145)
TOTAL PROTEIN: 6.6 g/dL (ref 6.0–8.3)
Total Bilirubin: 0.5 mg/dL (ref 0.3–1.2)

## 2015-03-10 LAB — BRAIN NATRIURETIC PEPTIDE: B Natriuretic Peptide: 23.2 pg/mL (ref 0.0–100.0)

## 2015-03-10 NOTE — Assessment & Plan Note (Signed)
Patient recently completed abx trx for otitis externa.  He says symptoms are still present.  He denies ear pain, discharge or clogged feeling.  I cannot visualize his left TM and I am concerned for perforation given his recent hx of infection. - refer to ENT for evaluation - he was advised not to use any ear drops until he sees ENT

## 2015-03-10 NOTE — Progress Notes (Signed)
Internal Medicine Clinic Attending  I saw and evaluated the patient.  I personally confirmed the key portions of the history and exam documented by Dr. Wilson and I reviewed pertinent patient test results.  The assessment, diagnosis, and plan were formulated together and I agree with the documentation in the resident's note. 

## 2015-03-10 NOTE — Patient Instructions (Signed)
1. Please DO NOT put anything in your left ear until you have been seen by Ear, Nose and Throat doctor and they will tell you if and when you can put drops in your ear.  I will call you if there are any lab problems.    2. Please take all medications as prescribed.    3. If you have worsening of your symptoms or new symptoms arise, please call the clinic ([phone]), or go to the ER immediately if symptoms are severe.

## 2015-03-10 NOTE — Progress Notes (Signed)
Subjective:    Patient ID: [name], male    DOB: 09/08/1956, 59 y.o.   MRN: [id number]  HPI Comments: [name] is a 59 year old male with PMH as below here for follow-up of B/L ear pain x months (initially left ear and now both in the past few weeks).  Please see problem based charting for A&P.     Past Medical History  Diagnosis Date  . TOBACCO ABUSE 11/25/2006  . CORONARY ARTERY DISEASE 11/25/2006    Cardiac cath by Dr Terrence Dupont 11/2003 LV showed good LV systolic function, EF of 61-44%. Left main was patent. LAD has 20-30% mid stenosis. Diagonal 1 and diagonal 2 were patent. Left circumflex was patent. OM1 was less than 0.5 mm which was diffusely diseased. OM2 has 20% ostial stenosis which was patent which  was moderate size. OM3 was patent at prior PTCA and stented site. RCA has 20-30% proximal   . Hypertension   . Atrial arrhythmia   . Type II diabetes mellitus   . Cellulitis of knee, right 06/06/12  . Chronic otitis externa 06/06/12    C Multiple trials with antibiocs and antifungal. Cultures were positive for Pseudomonas. Follow up with ENT on 06/14/12    . NSTEMI (non-ST elevated myocardial infarction) 08/14/2012    S/p Successful PTCA to proximal OM-3       Review of Systems  HENT: Positive for congestion, postnasal drip, rhinorrhea, sinus pressure, sneezing and tinnitus. Negative for ear discharge, ear pain, hearing loss and sore throat.        Ear "feels funny."  He denies feeling like the ear is clogged.  Eyes: Negative for visual disturbance.  Respiratory: Negative for cough and shortness of breath.   Cardiovascular: Positive for leg swelling. Negative for chest pain and palpitations.  Gastrointestinal: Negative for nausea, vomiting, abdominal pain, diarrhea, constipation and blood in stool.  Genitourinary: Negative for dysuria and hematuria.  Neurological: Negative for dizziness.       Filed Vitals:   03/10/15 1140  BP: 142/67  Pulse: 63  Temp: 97.7 F (36.5 C)    TempSrc: Oral  Height: 6\' 4"  (1.93 m)  Weight: 287 lb 8 oz (130.409 kg)  SpO2: 98%   Objective:   Physical Exam  Constitutional: He is oriented to person, place, and time. He appears well-developed. No distress.  HENT:  Head: Normocephalic and atraumatic.  Right Ear: External ear normal.  Mouth/Throat: Oropharynx is clear and moist. No oropharyngeal exudate.  Left external ear with dry scaly skin.  There is no mastoid, tragus or pinna tenderness B/L.  The left TM cannot be visualized.  The right TM has normal cone of light.  Eyes: EOM are normal. Pupils are equal, round, and reactive to light.  Neck: Neck supple.  Cardiovascular: Normal rate, regular rhythm and normal heart sounds.  Exam reveals no gallop and no friction rub.   No murmur heard. Pulmonary/Chest: Effort normal and breath sounds normal. No respiratory distress. He has no wheezes. He has no rales.  Abdominal: Soft. Bowel sounds are normal. He exhibits no distension and no mass. There is no tenderness. There is no rebound and no guarding.  Musculoskeletal: Normal range of motion. He exhibits no edema or tenderness.  Lymphadenopathy:    He has no cervical adenopathy.  Neurological: He is alert and oriented to person, place, and time. No cranial nerve deficit.  Skin: Skin is warm. He is not diaphoretic.  Psychiatric: He has a normal mood and  affect. His behavior is normal. Judgment and thought content normal.  Vitals reviewed.         Assessment & Plan:  Please see problem based charting for A&P.

## 2015-03-12 ENCOUNTER — Other Ambulatory Visit: Payer: Self-pay | Admitting: Dietician

## 2015-03-12 ENCOUNTER — Ambulatory Visit: Payer: 59 | Admitting: Dietician

## 2015-03-12 ENCOUNTER — Encounter: Payer: Self-pay | Admitting: Dietician

## 2015-03-12 DIAGNOSIS — E1165 Type 2 diabetes mellitus with hyperglycemia: Secondary | ICD-10-CM

## 2015-03-12 DIAGNOSIS — IMO0002 Reserved for concepts with insufficient information to code with codable children: Secondary | ICD-10-CM

## 2015-03-12 LAB — HM DIABETES EYE EXAM

## 2015-03-12 NOTE — Progress Notes (Signed)
Retinal images done and transmitted.  

## 2015-03-21 ENCOUNTER — Encounter: Payer: Self-pay | Admitting: *Deleted

## 2015-04-09 ENCOUNTER — Emergency Department (HOSPITAL_COMMUNITY): Payer: 59

## 2015-04-09 ENCOUNTER — Encounter (HOSPITAL_COMMUNITY): Payer: Self-pay | Admitting: *Deleted

## 2015-04-09 ENCOUNTER — Observation Stay (HOSPITAL_COMMUNITY)
Admission: EM | Admit: 2015-04-09 | Discharge: 2015-04-11 | Disposition: A | Discharge status: Home / Self Care | Payer: 59 | Attending: Internal Medicine | Admitting: Internal Medicine

## 2015-04-09 DIAGNOSIS — I4891 Unspecified atrial fibrillation: Secondary | ICD-10-CM | POA: Insufficient documentation

## 2015-04-09 DIAGNOSIS — R1013 Epigastric pain: Secondary | ICD-10-CM | POA: Insufficient documentation

## 2015-04-09 DIAGNOSIS — I10 Essential (primary) hypertension: Secondary | ICD-10-CM | POA: Insufficient documentation

## 2015-04-09 DIAGNOSIS — I2510 Atherosclerotic heart disease of native coronary artery without angina pectoris: Secondary | ICD-10-CM | POA: Insufficient documentation

## 2015-04-09 DIAGNOSIS — M545 Low back pain: Secondary | ICD-10-CM | POA: Insufficient documentation

## 2015-04-09 DIAGNOSIS — Z79899 Other long term (current) drug therapy: Secondary | ICD-10-CM | POA: Diagnosis not present

## 2015-04-09 DIAGNOSIS — Z872 Personal history of diseases of the skin and subcutaneous tissue: Secondary | ICD-10-CM | POA: Insufficient documentation

## 2015-04-09 DIAGNOSIS — H9213 Otorrhea, bilateral: Secondary | ICD-10-CM | POA: Insufficient documentation

## 2015-04-09 DIAGNOSIS — Z7901 Long term (current) use of anticoagulants: Secondary | ICD-10-CM | POA: Diagnosis not present

## 2015-04-09 DIAGNOSIS — R011 Cardiac murmur, unspecified: Secondary | ICD-10-CM | POA: Diagnosis not present

## 2015-04-09 DIAGNOSIS — Z9861 Coronary angioplasty status: Secondary | ICD-10-CM | POA: Diagnosis not present

## 2015-04-09 DIAGNOSIS — R079 Chest pain, unspecified: Principal | ICD-10-CM | POA: Insufficient documentation

## 2015-04-09 DIAGNOSIS — E119 Type 2 diabetes mellitus without complications: Secondary | ICD-10-CM | POA: Insufficient documentation

## 2015-04-09 LAB — CBC WITH DIFFERENTIAL/PLATELET
BASOS PCT: 0 % (ref 0–1)
Basophils Absolute: 0 10*3/uL (ref 0.0–0.1)
Eosinophils Absolute: 0.2 10*3/uL (ref 0.0–0.7)
Eosinophils Relative: 4 % (ref 0–5)
HCT: 41.2 % (ref 39.0–52.0)
HEMOGLOBIN: 13.7 g/dL (ref 13.0–17.0)
Lymphocytes Relative: 51 % — ABNORMAL HIGH (ref 12–46)
Lymphs Abs: 3.4 10*3/uL (ref 0.7–4.0)
MCH: 29.8 pg (ref 26.0–34.0)
MCHC: 33.3 g/dL (ref 30.0–36.0)
MCV: 89.6 fL (ref 78.0–100.0)
MONOS PCT: 4 % (ref 3–12)
Monocytes Absolute: 0.3 10*3/uL (ref 0.1–1.0)
NEUTROS ABS: 2.8 10*3/uL (ref 1.7–7.7)
Neutrophils Relative %: 41 % — ABNORMAL LOW (ref 43–77)
Platelets: 161 10*3/uL (ref 150–400)
RBC: 4.6 MIL/uL (ref 4.22–5.81)
RDW: 14.5 % (ref 11.5–15.5)
WBC: 6.8 10*3/uL (ref 4.0–10.5)

## 2015-04-09 LAB — BASIC METABOLIC PANEL
ANION GAP: 13 (ref 5–15)
BUN: 17 mg/dL (ref 6–20)
CO2: 22 mmol/L (ref 22–32)
Calcium: 9.1 mg/dL (ref 8.9–10.3)
Chloride: 106 mmol/L (ref 101–111)
Creatinine, Ser: 0.89 mg/dL (ref 0.61–1.24)
Glucose, Bld: 189 mg/dL — ABNORMAL HIGH (ref 70–99)
Potassium: 3.6 mmol/L (ref 3.5–5.1)
SODIUM: 141 mmol/L (ref 135–145)

## 2015-04-09 LAB — TROPONIN I: Troponin I: <0.03 ng/mL (ref <0.031)

## 2015-04-09 MED ORDER — ONDANSETRON HCL 4 MG/2ML IJ SOLN
4.0000 mg | Freq: Once | INTRAMUSCULAR | Status: AC
  Administered 2015-04-09: 4 mg via INTRAVENOUS
  Filled 2015-04-09: qty 2

## 2015-04-09 MED ORDER — MORPHINE SULFATE 4 MG/ML IJ SOLN
4.0000 mg | Freq: Once | INTRAMUSCULAR | Status: AC
  Administered 2015-04-09: 4 mg via INTRAVENOUS
  Filled 2015-04-09: qty 1

## 2015-04-09 NOTE — ED Notes (Signed)
The pt has multiple complaints for 2-03 days.  He has chronic back pain and he has had lower back pain with radiation down his rt leg he is also c/o some mid-chest poain that he feels is indigestion  No temp.  He has had some bi-lateral ear  Pain and drainage  And he cannot sleep at night

## 2015-04-09 NOTE — ED Notes (Signed)
Patient transported to X-ray 

## 2015-04-09 NOTE — H&P (Signed)
Date: 04/10/2015               Patient Name:  [name] MRN: [id number]  DOB: June 14, 1956 Age / Sex: 59 y.o., male   PCP: Otho Bellows, MD         Medical Service: Internal Medicine Teaching Service         Attending Physician: Dr. Bartholomew Crews, MD    First Contact: Dr. Trudee Kuster Pager: [phone]  Second Contact: Dr. Heber Pioneer Pager: [phone]       After Hours (After 5p/  First Contact Pager: [phone]  weekends / holidays): Second Contact Pager: [phone]   Chief Complaint: chest pain, ear drainage, back pain  History of Present Illness:   Patient is a 59 year old gentleman with a history of coronary artery disease (status post PCI in 2013), hypertension, paroxysmal atrial fibrillation, type 2 diabetes, chronic otitis externa who presents to the emergency departments for chest pain, ear drainage, and back pain.  Patient states that his chest pain started 2-3 days ago. He states that it is located on the left side of his chest, initially started at a 7 out of 10 in severity, is a burning sensation, that does not radiate. He states that he thought that it was initially indigestion and took some over-the-counter medicines for this but they did not help. Additionally, he states that back in 2013, he had very similar symptoms which he thought was indigestion at the time but ended up being a NSTEMI that was treated with PCI by Dr. Terrence Dupont. During this episode, patient states that he forgot to take nitroglycerin for it. 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 He does report some associated dyspnea but denies any nausea or vomiting. He denies any recent unilateral leg swelling but notes that he has had significant leg swelling in the past.  Additionally, patient reports that his been having increased drainage from both of his ears over the last several days. He has a history of chronic otitis externa and was last  evaluated in our clinic around 1 month ago. Prior to that, he had finished a course of topical antibiotics, however, these were not continued at his last clinic visit because of concern for a tympanic membrane perforation noticed on physical exam. Patient was referred to ENT. To date, patient has not been evaluated by ENT yet and has a appointment planned for about a week from now. He notes that he has had some diminished hearing in his left ear greater than right ear. He denies any significant pain outside of his ears but does report some discomfort in his pinna. He denies any fevers or chills.  Additionally, patient also reports right-sided back pain that radiates down his leg is consistent with his chronic symptoms. He denies any bowel or bladder incontinence. He states that he is taking NSAIDs for this pain as an outpatient but has had minimal relief. Patient is still able to ambulate.   Additionally, patient has had bilateral eye redness and drainage over the last month. He states that his eyes are crusted over in the morning. He has tried over-the-counter eyedrops including those with antihistamines with no relief. He denies any associated sneezing or rhinorrhea. He denies any association with seasonal changes.  Otherwise, patient denies any fevers, nausea, vomiting, abdominal pain, constipation, diarrhea, dysuria, or hematuria.  Meds: Medications Prior to Admission  Medication Sig Dispense Refill  .  glipiZIDE (GLUCOTROL) 10 MG tablet TAKE ONE TABLET BY MOUTH ONCE DAILY 90 tablet 3  . ibuprofen (ADVIL,MOTRIN) 800 MG tablet Take 800 mg by mouth every 8 (eight) hours as needed for headache or mild pain.     . isosorbide mononitrate (IMDUR) 30 MG 24 hr tablet TAKE ONE TABLET BY MOUTH ONCE DAILY 90 tablet 3  . lisinopril (PRINIVIL,ZESTRIL) 5 MG tablet Take 1 tablet (5 mg total) by mouth daily. 90 tablet 3  . metFORMIN (GLUCOPHAGE) 1000 MG tablet TAKE ONE TABLET BY MOUTH TWICE DAILY WITH MEALS  (Patient taking differently: TAKE ONE TABLET BY MOUTH once  DAILY WITH MEALS) 180 tablet 0  . NITROSTAT 0.4 MG SL tablet DISSOLVE ONE TABLET UNDER THE TONGUE EVERY 5 MINUTES AS NEEDED FOR CHEST PAIN.  DO NOT EXCEED A TOTAL OF 3 DOSES IN 15 MINUTES 30 tablet 0  . pravastatin (PRAVACHOL) 40 MG tablet Take 1 tablet (40 mg total) by mouth daily. 90 tablet 3  . selenium sulfide (SELSUN) 2.5 % shampoo Apply topically daily as needed for irritation. 118 mL 3  . sotalol (BETAPACE) 80 MG tablet TAKE ONE-HALF TABLET BY MOUTH TWICE DAILY (Patient taking differently: TAKE ONE TABLET BY MOUTH DAILY) 180 tablet 0  . spironolactone (ALDACTONE) 25 MG tablet Take 25 mg by mouth daily.    Marland Kitchen triamcinolone lotion (KENALOG) 0.1 % Apply topically 2 (two) times daily. 60 mL 1  . XARELTO 20 MG TABS tablet Take 20 mg by mouth daily.     Marland Kitchen acetaminophen (TYLENOL) 500 MG tablet Take 1 tablet (500 mg total) by mouth every 8 (eight) hours as needed for fever. (Patient not taking: Reported on 02/05/2015) 30 tablet 0  . aspirin EC 81 MG tablet Take 1 tablet (81 mg total) by mouth daily. (Patient not taking: Reported on 03/10/2015) 90 tablet 3  . Benzocaine-Menthol (CEPACOL SORE THROAT) 10-2.1 MG LOZG Use as directed 1 lozenge in the mouth or throat 3 (three) times daily as needed (sore throat). (Patient not taking: Reported on 02/05/2015) 18 each 0  . fluocinonide cream (LIDEX) 3.15 % Apply 1 application topically 2 (two) times daily. Apply a thin layer 2 times a day to the skin eruptions on your leg (Patient not taking: Reported on 03/10/2015) 30 g 1   Current Facility-Administered Medications  Medication Dose Route Frequency Provider Last Rate Last Dose  . acetaminophen (TYLENOL) tablet 650 mg  650 mg Oral Q4H PRN Jones Bales, MD      . aspirin EC tablet 81 mg  81 mg Oral Daily Jones Bales, MD      . gi cocktail (Maalox,Lidocaine,Donnatal)  30 mL Oral QID PRN Jones Bales, MD   30 mL at 04/10/15 0200  . ibuprofen  (ADVIL,MOTRIN) tablet 800 mg  800 mg Oral Q8H PRN Jones Bales, MD   800 mg at 04/10/15 0200  . insulin aspart (novoLOG) injection 0-9 Units  0-9 Units Subcutaneous TID WC Luan Moore, MD      . isosorbide mononitrate (IMDUR) 24 hr tablet 30 mg  30 mg Oral Daily Jones Bales, MD      . lisinopril (PRINIVIL,ZESTRIL) tablet 5 mg  5 mg Oral Daily Jones Bales, MD      . morphine 2 MG/ML injection 1 mg  1 mg Intravenous Q2H PRN Jones Bales, MD      . nitroGLYCERIN (NITROSTAT) SL tablet 0.4 mg  0.4 mg Sublingual Q5 min PRN Jones Bales, MD      .  pravastatin (PRAVACHOL) tablet 40 mg  40 mg Oral q1800 Jones Bales, MD      . promethazine (PHENERGAN) tablet 12.5 mg  12.5 mg Oral Q6H PRN Jones Bales, MD       Or  . promethazine (PHENERGAN) injection 12.5 mg  12.5 mg Intravenous Q6H PRN Jones Bales, MD       Or  . promethazine (PHENERGAN) suppository 12.5 mg  12.5 mg Rectal Q6H PRN Jones Bales, MD      . sotalol (BETAPACE) tablet 40 mg  40 mg Oral BID Jones Bales, MD      . spironolactone (ALDACTONE) tablet 25 mg  25 mg Oral Daily Jones Bales, MD      . trimethoprim-polymyxin b (POLYTRIM) ophthalmic solution 1 drop  1 drop Both Eyes TID AC & HS Luan Moore, MD        Past Medical History  Diagnosis Date  . TOBACCO ABUSE 11/25/2006  . CORONARY ARTERY DISEASE 11/25/2006    Cardiac cath by Dr Terrence Dupont 11/2003 LV showed good LV systolic function, EF of 50-27%. Left main was patent. LAD has 20-30% mid stenosis. Diagonal 1 and diagonal 2 were patent. Left circumflex was patent. OM1 was less than 0.5 mm which was diffusely diseased. OM2 has 20% ostial stenosis which was patent which  was moderate size. OM3 was patent at prior PTCA and stented site. RCA has 20-30% proximal   . Hypertension   . Atrial arrhythmia   . Type II diabetes mellitus   . Cellulitis of knee, right 06/06/12  . Chronic otitis externa 06/06/12    C Multiple trials with antibiocs and  antifungal. Cultures were positive for Pseudomonas. Follow up with ENT on 06/14/12    . NSTEMI (non-ST elevated myocardial infarction) 08/14/2012    S/p Successful PTCA to proximal OM-3       Past Surgical History  Procedure Laterality Date  . Coronary angioplasty with stent placement  ~ 2005    "1"  . Cystectomy  1990's    LUA  . Left heart catheterization with coronary angiogram N/A 08/15/2012    Procedure: LEFT HEART CATHETERIZATION WITH CORONARY ANGIOGRAM;  Surgeon: Clent Demark, MD;  Location: [facility] CATH LAB;  Service: Cardiovascular;  Laterality: N/A;  . Percutaneous coronary stent intervention (pci-s)  08/15/2012    Procedure: PERCUTANEOUS CORONARY STENT INTERVENTION (PCI-S);  Surgeon: Clent Demark, MD;  Location: [facility] CATH LAB;  Service: Cardiovascular;;     Allergies: Allergies as of 04/09/2015  . (No Known Allergies)    No family history on file.  History   Social History  . Marital Status: Married    Spouse Name: N/A  . Number of Children: N/A  . Years of Education: N/A   Occupational History  . Not on file.   Social History Main Topics  . Smoking status: Former Smoker -- 0.20 packs/day for 12 years    Types: Cigarettes    Quit date: 11/29/2014  . Smokeless tobacco: Never Used     Comment: quit about 24mths ago  . Alcohol Use: No  . Drug Use: No  . Sexual Activity: Yes   Other Topics Concern  . Not on file   Social History Narrative     Review of Systems: All pertinent ROS as stated in HPI.   Physical Exam: Blood pressure 140/68, pulse 76, temperature 97.8 F (36.6 C), temperature source Oral, resp. rate 18, height 6\' 3"  (1.905 m), weight 127.551 kg (281 lb  3.2 oz), SpO2 93 %. General: resting in bed, in no acute distress HEENT: PERRL, EOMI, no scleral icterus, conjunctival erythema, dried crusting periorbitally, tympanic membrane visualized on the right with moderate cerumen/crusting, tympanic membrane not visualized on left with moderate  cerumen/crusting, no significant tenderness upon manipulation of pinna, swelling of the left pinna greater than right Cardiac: RRR, no rubs, murmurs or gallops Pulm: Mild left basilar rales, otherwise clear to auscultation, moving normal volumes of air Abd: soft, very mild tenderness suprapubically, otherwise no tenderness, nondistended, BS present Ext: warm and well perfused, trace bilateral lower extremity edema Neuro: alert and oriented X3, cranial nerves II-XII grossly intact Skin: no rashes or lesions noted Psych: appropriate affect  Lab results: Basic Metabolic Panel:  Recent Labs  04/09/15 2156  NA 141  K 3.6  CL 106  CO2 22  GLUCOSE 189*  BUN 17  CREATININE 0.89  CALCIUM 9.1   Liver Function Tests: No results for input(s): AST, ALT, ALKPHOS, BILITOT, PROT, ALBUMIN in the last 72 hours. No results for input(s): LIPASE, AMYLASE in the last 72 hours. No results for input(s): AMMONIA in the last 72 hours. CBC:  Recent Labs  04/09/15 2156  WBC 6.8  NEUTROABS 2.8  HGB 13.7  HCT 41.2  MCV 89.6  PLT 161   Cardiac Enzymes:  Recent Labs  04/09/15 2156  TROPONINI <0.03   BNP: No results for input(s): PROBNP in the last 72 hours. D-Dimer: No results for input(s): DDIMER in the last 72 hours. CBG: No results for input(s): GLUCAP in the last 72 hours. Hemoglobin A1C: No results for input(s): HGBA1C in the last 72 hours. Fasting Lipid Panel: No results for input(s): CHOL, HDL, LDLCALC, TRIG, CHOLHDL, LDLDIRECT in the last 72 hours. Thyroid Function Tests: No results for input(s): TSH, T4TOTAL, FREET4, T3FREE, THYROIDAB in the last 72 hours. Anemia Panel: No results for input(s): VITAMINB12, FOLATE, FERRITIN, TIBC, IRON, RETICCTPCT in the last 72 hours. Coagulation: No results for input(s): LABPROT, INR in the last 72 hours. Urine Drug Screen: Drugs of Abuse     Component Value Date/Time   LABOPIA POSITIVE* 04/10/2015 0100   LABOPIA NEG 02/05/2015 1700    COCAINSCRNUR NONE DETECTED 04/10/2015 0100   COCAINSCRNUR NEG 02/05/2015 1700   LABBENZ NONE DETECTED 04/10/2015 0100   LABBENZ NEG 02/05/2015 1700   AMPHETMU NONE DETECTED 04/10/2015 0100   AMPHETMU NEG 02/05/2015 1700   THCU NONE DETECTED 04/10/2015 0100   THCU NEG 02/05/2015 1700   LABBARB NONE DETECTED 04/10/2015 0100   LABBARB NEG 02/05/2015 1700    Alcohol Level: No results for input(s): ETH in the last 72 hours. Urinalysis: No results for input(s): COLORURINE, LABSPEC, PHURINE, GLUCOSEU, HGBUR, BILIRUBINUR, KETONESUR, PROTEINUR, UROBILINOGEN, NITRITE, LEUKOCYTESUR in the last 72 hours.  Invalid input(s): APPERANCEUR  Imaging results:  Dg Chest 2 View  04/09/2015   CLINICAL DATA:  Left upper chest pain.  Shortness of breath.  EXAM: CHEST  2 VIEW  COMPARISON:  02/22/2013  FINDINGS: Linear scarring at the right lung base. Faint chronic interstitial accentuation. Old left rib deformities likely from old fracture. Thoracic spondylosis.  The lungs appear otherwise clear. No airspace opacity. No pleural effusion.  IMPRESSION: 1. Faint chronic interstitial accentuation. Mild scarring at the right lung base. No acute findings.   Electronically Signed   By: Van Clines M.D.   On: 04/09/2015 23:00    Other results: EKG Interpretation  Date/Time:  Wednesday Apr 09 2015 21:53:48 EDT Ventricular Rate:  91 PR Interval:  166 QRS Duration: 88 QT Interval:  372 QTC Calculation: 457 R Axis:   82 Text Interpretation:  Normal sinus rhythm ST \\T \ T wave abnormality, consider inferolateral ischemia Abnormal ECG Since last tracing rate faster Confirmed by MILLER  MD, BRIAN ([id number]) on 04/09/2015 9:56:10 PM   Assessment & Plan by Problem: Active Problems:   Chest pain  Patient is a 59 year old gentleman with a history of coronary artery disease (status post PCI in 2013), hypertension, paroxysmal atrial fibrillation, type 2 diabetes, chronic otitis externa who is admitted for chest pain in  the setting of history of coronary artery disease.  Chest pain with history of coronary artery disease: Patient with a history of coronary artery disease presenting with chest pain that is somewhat atypical although consistent with prior chest pain from previous NSTEMI. Patient is followed by Dr. Terrence Dupont as an outpatient and received a PCI in 2013 in the proximal OM-3. Patient has several risk factors including history of coronary artery disease, hypertension, diabetes, and current tobacco use. TIMI score of 3 with a 13% risk at 14 days of all cause mortality, new or recurrent MI, or severe recurrent ischemia. EKG with nonspecific ST/T-wave abnormalities in the inferior lateral leads. Troponin negative 1. Chest x-ray with no acute findings. No echocardiograms on file. Wells score of 0 (and also on Xarelto). Acid reflux/indigestion is also possible although patient has previously presented with atypical symptoms in the past that turned out to be cardiac in etiology. -Admit to telemetry -Continue home aspirin 81 mg -Continue home Imdur 30 mg daily -Continue sotalol 40 mg twice a day -GI cocktail -Urine drug screen -Morphine 1 mg every 2 hours when necessary -Sublingual nitroglycerin -Pravastatin 40 mg daily.  -Continue to trend troponins. Touch base with Dr. Terrence Dupont in the morning. Repeat EKG in the morning. -GI cocktail -Phenergan  Bacterial conjunctivitis: Crusting of bilateral eyes with conjunctival erythema bilaterally. -Trimethoprim-polymyxin B ophthalmic drops 4 times a day for 5-7 days.  Right lower back pain: At home, patient is on ibuprofen 800 mg 3 times a day. Patient has a history of being discharged from pain management with Dr. Tessa Lerner due to a positive UDS remarkable for alcohol and THC. This was thought to be due to indiscretion over the Christmas holiday and there was plan to refer him to another pain specialist from the outpatient clinic. -Continue home ibuprofen.  Chronic  otitis externa: Patient has a history of chronic otitis externa status post multiple trials of antibiotics and antifungals. Cultures have been positive for Pseudomonas in the past. He has been seen by ENT in the past. Patient recently seen in clinic for persistent symptoms with concern for a perforated left tympanic membrane, pending referral to ENT. Patient does not have any immunocompromising factors to warrant more aggressive therapy. Pain is not significant at this point. -Defer to outpatient ENT evaluation  Type 2 diabetes: At home, patient is on glipizide 10 mg daily and metformin 1000 mg twice a day. Last hemoglobin A1c of 6.5 from March 2016. -Hold home oral regimen. -Sliding-scale insulin  Hypertension: At home, patient is on lisinopril 5 mg daily, isosorbide 30 mg daily, spironolactone 25 mg daily. Patient is normotensive upon initial evaluation. -Continue home regimen.  Atrial fibrillation: At home, patient is on sotalol 40 mg twice a day and Xarelto 20 mg daily. Patient is currently in sinus rhythm. Documentation is sparse in the record the patient states that he was first diagnosed with atrial fibrillation many years ago by Dr. Terrence Dupont. All  EKGs and systems seem to be sinus. Currently in sinus rhythm. Still followed by Dr. Terrence Dupont of cardiology regularly. -Hold Xarelto pending further evaluation for possible cardiac catheterization.  Diet: NPO at least until morning pending troponins Prophylaxis: SCDs Code: Full  Dispo: Disposition is deferred at this time, awaiting improvement of current medical problems. Anticipated discharge in approximately 2 day(s).   The patient does have a current PCP Otho Bellows, MD) and does need an [facility] hospital follow-up appointment after discharge.  The patient does not have transportation limitations that hinder transportation to clinic appointments.  Signed: Luan Moore, M.D., Ph.D. Internal Medicine Teaching Service, PGY-1 04/10/2015, 2:02  AM

## 2015-04-09 NOTE — ED Provider Notes (Signed)
CSN: [id number]     Arrival date & time 04/09/15  2118 History   First MD Initiated Contact with Patient 04/09/15 2214     Chief Complaint  Patient presents with  . multiple complaints      (Consider location/radiation/quality/duration/timing/severity/associated sxs/prior Treatment) HPI Comments: 59 year old male multiple chief complaints. First complaint chest pain. Patient describing a sternal and epigastric type pain. States is a burning pain. Has been present for several days intermittently. Has been present today since noon. Has been constant since noon. Moderate pain. Nonradiating. No treatments attempted.   Patient also complaining of right lower back pain. Is localized over the right gluteus area. Radiates down the right leg. Made worse with movement. Patient also complaining of bilateral ear pain and drainage for approximately 1 week.   Past Medical History  Diagnosis Date  . TOBACCO ABUSE 11/25/2006  . CORONARY ARTERY DISEASE 11/25/2006    Cardiac cath by Dr Terrence Dupont 11/2003 LV showed good LV systolic function, EF of 44-03%. Left main was patent. LAD has 20-30% mid stenosis. Diagonal 1 and diagonal 2 were patent. Left circumflex was patent. OM1 was less than 0.5 mm which was diffusely diseased. OM2 has 20% ostial stenosis which was patent which  was moderate size. OM3 was patent at prior PTCA and stented site. RCA has 20-30% proximal   . Hypertension   . Atrial arrhythmia   . Type II diabetes mellitus   . Cellulitis of knee, right 06/06/12  . Chronic otitis externa 06/06/12    C Multiple trials with antibiocs and antifungal. Cultures were positive for Pseudomonas. Follow up with ENT on 06/14/12    . NSTEMI (non-ST elevated myocardial infarction) 08/14/2012    S/p Successful PTCA to proximal OM-3      Past Surgical History  Procedure Laterality Date  . Coronary angioplasty with stent placement  ~ 2005    "1"  . Cystectomy  1990's    LUA  . Left heart catheterization with coronary  angiogram N/A 08/15/2012    Procedure: LEFT HEART CATHETERIZATION WITH CORONARY ANGIOGRAM;  Surgeon: Clent Demark, MD;  Location: [facility] CATH LAB;  Service: Cardiovascular;  Laterality: N/A;  . Percutaneous coronary stent intervention (pci-s)  08/15/2012    Procedure: PERCUTANEOUS CORONARY STENT INTERVENTION (PCI-S);  Surgeon: Clent Demark, MD;  Location: [facility] CATH LAB;  Service: Cardiovascular;;   No family history on file. History  Substance Use Topics  . Smoking status: Former Smoker -- 0.20 packs/day for 12 years    Types: Cigarettes    Quit date: 11/29/2014  . Smokeless tobacco: Never Used     Comment: quit about 80mths ago  . Alcohol Use: No    Review of Systems  Constitutional: Negative for fever.  HENT: Negative for congestion.   Respiratory: Negative for shortness of breath.   Cardiovascular: Positive for chest pain.  Gastrointestinal: Negative for abdominal pain.  Musculoskeletal: Positive for back pain.  Skin: Negative for rash.  Neurological: Negative for headaches.  Psychiatric/Behavioral: Negative for confusion.  All other systems reviewed and are negative.     Allergies  Review of patient's allergies indicates no known allergies.  Home Medications   Prior to Admission medications   Medication Sig Start Date End Date Taking? Authorizing Provider  glipiZIDE (GLUCOTROL) 10 MG tablet TAKE ONE TABLET BY MOUTH ONCE DAILY 08/02/14  Yes Otho Bellows, MD  ibuprofen (ADVIL,MOTRIN) 800 MG tablet Take 800 mg by mouth every 8 (eight) hours as needed for headache or mild pain.  11/10/14  Yes Historical Provider, MD  isosorbide mononitrate (IMDUR) 30 MG 24 hr tablet TAKE ONE TABLET BY MOUTH ONCE DAILY 08/02/14  Yes Otho Bellows, MD  lisinopril (PRINIVIL,ZESTRIL) 5 MG tablet Take 1 tablet (5 mg total) by mouth daily. 02/05/15  Yes Otho Bellows, MD  metFORMIN (GLUCOPHAGE) 1000 MG tablet TAKE ONE TABLET BY MOUTH TWICE DAILY WITH MEALS Patient taking differently: TAKE ONE  TABLET BY MOUTH once  DAILY WITH MEALS 10/28/14  Yes Otho Bellows, MD  NITROSTAT 0.4 MG SL tablet DISSOLVE ONE TABLET UNDER THE TONGUE EVERY 5 MINUTES AS NEEDED FOR CHEST PAIN.  DO NOT EXCEED A TOTAL OF 3 DOSES IN 15 MINUTES 11/18/14  Yes Otho Bellows, MD  pravastatin (PRAVACHOL) 40 MG tablet Take 1 tablet (40 mg total) by mouth daily. 11/20/14  Yes Otho Bellows, MD  selenium sulfide (SELSUN) 2.5 % shampoo Apply topically daily as needed for irritation. 03/06/15  Yes Otho Bellows, MD  sotalol (BETAPACE) 80 MG tablet TAKE ONE-HALF TABLET BY MOUTH TWICE DAILY Patient taking differently: TAKE ONE TABLET BY MOUTH DAILY 10/28/14  Yes Otho Bellows, MD  spironolactone (ALDACTONE) 25 MG tablet Take 25 mg by mouth daily.   Yes Historical Provider, MD  triamcinolone lotion (KENALOG) 0.1 % Apply topically 2 (two) times daily. 02/05/15  Yes Otho Bellows, MD  XARELTO 20 MG TABS tablet Take 20 mg by mouth daily.  11/04/14  Yes Historical Provider, MD  acetaminophen (TYLENOL) 500 MG tablet Take 1 tablet (500 mg total) by mouth every 8 (eight) hours as needed for fever. Patient not taking: Reported on 02/05/2015 11/01/13   Otho Bellows, MD  aspirin EC 81 MG tablet Take 1 tablet (81 mg total) by mouth daily. Patient not taking: Reported on 03/10/2015 08/23/13   Otho Bellows, MD  Benzocaine-Menthol (CEPACOL SORE THROAT) 10-2.1 MG LOZG Use as directed 1 lozenge in the mouth or throat 3 (three) times daily as needed (sore throat). Patient not taking: Reported on 02/05/2015 09/12/14   Jessee Avers, MD  fluocinonide cream (LIDEX) 1.61 % Apply 1 application topically 2 (two) times daily. Apply a thin layer 2 times a day to the skin eruptions on your leg Patient not taking: Reported on 03/10/2015 05/22/14   Otho Bellows, MD   BP 134/62 mmHg  Pulse 81  Temp(Src) 99.1 F (37.3 C) (Oral)  Resp 23  Wt 285 lb (129.275 kg)  SpO2 95% Physical Exam  Constitutional: He is oriented to person, place, and  time. He appears well-developed.  HENT:  Head: Normocephalic.  Drainage of bilateral ears. No tenderness to palpation mastoid  Eyes: Pupils are equal, round, and reactive to light.  Cardiovascular: Normal rate.   Murmur heard. Pulmonary/Chest: Effort normal. No respiratory distress.  Abdominal: He exhibits no distension.  Musculoskeletal: He exhibits tenderness.  Straight leg raise positive right. Tender to palpation over the use. No midline pain  Neurological: He is alert and oriented to person, place, and time. No cranial nerve deficit.  Skin: Skin is warm. No rash noted.  Psychiatric: He has a normal mood and affect.  Vitals reviewed.   ED Course  Procedures (including critical care time) Labs Review Labs Reviewed  BASIC METABOLIC PANEL - Abnormal; Notable for the following:    Glucose, Bld 189 (*)    All other components within normal limits  CBC WITH DIFFERENTIAL/PLATELET - Abnormal; Notable for the following:    Neutrophils Relative % 41 (*)  Lymphocytes Relative 51 (*)    All other components within normal limits  TROPONIN I    Imaging Review Dg Chest 2 View  04/09/2015   CLINICAL DATA:  Left upper chest pain.  Shortness of breath.  EXAM: CHEST  2 VIEW  COMPARISON:  02/22/2013  FINDINGS: Linear scarring at the right lung base. Faint chronic interstitial accentuation. Old left rib deformities likely from old fracture. Thoracic spondylosis.  The lungs appear otherwise clear. No airspace opacity. No pleural effusion.  IMPRESSION: 1. Faint chronic interstitial accentuation. Mild scarring at the right lung base. No acute findings.   Electronically Signed   By: Van Clines M.D.   On: 04/09/2015 23:00     EKG Interpretation   Date/Time:  Wednesday Apr 09 2015 21:53:48 EDT Ventricular Rate:  91 PR Interval:  166 QRS Duration: 88 QT Interval:  372 QTC Calculation: 457 R Axis:   82 Text Interpretation:  Normal sinus rhythm ST \\T \ T wave abnormality,  consider  inferolateral ischemia Abnormal ECG Since last tracing rate  faster Confirmed by MILLER  MD, BRIAN ([id number]) on 04/09/2015 9:56:10 PM      MDM   59 year old male comes with multiple complaints. No significant chest pain. Has history of ACS. Status Christiano Blandon stent placement. Multivessel disease known. Patient describing left-sided chest pain. Initial EKG showed new ST wave changes in the inferior and lateral leads worsened since previous EKG. Initial troponin negative. Given patient's multiple risk factors, known ACS with new or worsening EKG changes we'll plan for admission to the internal medicine service. Patient also complains of bilateral ear pain. He does have some desquamation of the skin on the pinna. Questionable inflammatory changes versus otitis externa. 5. Patient also complaining of lower back pain on the right side. Straight leg raise test positive. Reproducible sciatic area most consistent with sciatica. No midline pain. Normal neurological exam. No red flag symptoms. Patient was given IV fluids and IV narcotics for pain here. We'll admit to internal.  Final diagnoses:  Chest pain, unspecified chest pain type        Robynn Pane, MD 04/09/15 [id number]  Noemi Chapel, MD 04/10/15 2150

## 2015-04-10 DIAGNOSIS — E119 Type 2 diabetes mellitus without complications: Secondary | ICD-10-CM

## 2015-04-10 DIAGNOSIS — H9213 Otorrhea, bilateral: Secondary | ICD-10-CM | POA: Diagnosis not present

## 2015-04-10 DIAGNOSIS — H109 Unspecified conjunctivitis: Secondary | ICD-10-CM

## 2015-04-10 DIAGNOSIS — R011 Cardiac murmur, unspecified: Secondary | ICD-10-CM | POA: Diagnosis not present

## 2015-04-10 DIAGNOSIS — M545 Low back pain: Secondary | ICD-10-CM | POA: Diagnosis not present

## 2015-04-10 DIAGNOSIS — I4891 Unspecified atrial fibrillation: Secondary | ICD-10-CM

## 2015-04-10 DIAGNOSIS — R079 Chest pain, unspecified: Secondary | ICD-10-CM

## 2015-04-10 DIAGNOSIS — Z7982 Long term (current) use of aspirin: Secondary | ICD-10-CM | POA: Diagnosis not present

## 2015-04-10 DIAGNOSIS — I2510 Atherosclerotic heart disease of native coronary artery without angina pectoris: Secondary | ICD-10-CM

## 2015-04-10 DIAGNOSIS — I10 Essential (primary) hypertension: Secondary | ICD-10-CM

## 2015-04-10 DIAGNOSIS — H6060 Unspecified chronic otitis externa, unspecified ear: Secondary | ICD-10-CM

## 2015-04-10 DIAGNOSIS — Z79899 Other long term (current) drug therapy: Secondary | ICD-10-CM

## 2015-04-10 LAB — HEPARIN LEVEL (UNFRACTIONATED)
Heparin Unfractionated: 0.11 IU/mL — ABNORMAL LOW (ref 0.30–0.70)
Heparin Unfractionated: 0.37 IU/mL (ref 0.30–0.70)

## 2015-04-10 LAB — RAPID URINE DRUG SCREEN, HOSP PERFORMED
Amphetamines: NOT DETECTED
BENZODIAZEPINES: NOT DETECTED
Barbiturates: NOT DETECTED
Cocaine: NOT DETECTED
Opiates: POSITIVE — AB
Tetrahydrocannabinol: NOT DETECTED

## 2015-04-10 LAB — GLUCOSE, CAPILLARY
GLUCOSE-CAPILLARY: 150 mg/dL — AB (ref 65–99)
Glucose-Capillary: 157 mg/dL — ABNORMAL HIGH (ref 65–99)
Glucose-Capillary: 131 mg/dL — ABNORMAL HIGH (ref 65–99)
Glucose-Capillary: 154 mg/dL — ABNORMAL HIGH (ref 65–99)

## 2015-04-10 LAB — TROPONIN I
Troponin I: <0.03 ng/mL (ref <0.031)
Troponin I: <0.03 ng/mL (ref <0.031)

## 2015-04-10 LAB — APTT
aPTT: 26 seconds (ref 24–37)
aPTT: 53 seconds — ABNORMAL HIGH (ref 24–37)

## 2015-04-10 MED ORDER — MORPHINE SULFATE 2 MG/ML IJ SOLN
1.0000 mg | INTRAMUSCULAR | Status: DC | PRN
  Administered 2015-04-10 (×3): 1 mg via INTRAVENOUS
  Filled 2015-04-10 (×3): qty 1

## 2015-04-10 MED ORDER — ACETAMINOPHEN 325 MG PO TABS: 650.0000 mg | ORAL_TABLET | ORAL | Status: DC | PRN

## 2015-04-10 MED ORDER — POLYMYXIN B-TRIMETHOPRIM 10000-0.1 UNIT/ML-% OP SOLN
1.0000 [drp] | Freq: Three times a day (TID) | OPHTHALMIC | Status: DC
  Administered 2015-04-10: 1 [drp] via OPHTHALMIC
  Filled 2015-04-10: qty 10

## 2015-04-10 MED ORDER — GI COCKTAIL ~~LOC~~
30.0000 mL | Freq: Four times a day (QID) | ORAL | Status: DC | PRN
  Administered 2015-04-10 – 2015-04-11 (×2): 30 mL via ORAL
  Filled 2015-04-10 (×2): qty 30

## 2015-04-10 MED ORDER — SOTALOL HCL 80 MG PO TABS
40.0000 mg | ORAL_TABLET | Freq: Two times a day (BID) | ORAL | Status: DC
  Administered 2015-04-10 – 2015-04-11 (×3): 40 mg via ORAL
  Filled 2015-04-10 (×5): qty 0.5

## 2015-04-10 MED ORDER — PRAVASTATIN SODIUM 40 MG PO TABS
40.0000 mg | ORAL_TABLET | Freq: Every day | ORAL | Status: DC
  Administered 2015-04-10: 40 mg via ORAL
  Filled 2015-04-10: qty 1

## 2015-04-10 MED ORDER — RIVAROXABAN 20 MG PO TABS: 20.0000 mg | ORAL_TABLET | Freq: Every day | ORAL | Status: DC

## 2015-04-10 MED ORDER — HYPROMELLOSE (GONIOSCOPIC) 2.5 % OP SOLN: 1.0000 [drp] | OPHTHALMIC | Status: DC | PRN

## 2015-04-10 MED ORDER — LISINOPRIL 5 MG PO TABS
5.0000 mg | ORAL_TABLET | Freq: Every day | ORAL | Status: DC
  Administered 2015-04-10 – 2015-04-11 (×2): 5 mg via ORAL
  Filled 2015-04-10 (×3): qty 1

## 2015-04-10 MED ORDER — ISOSORBIDE MONONITRATE ER 30 MG PO TB24
30.0000 mg | ORAL_TABLET | Freq: Every day | ORAL | Status: DC
  Administered 2015-04-10 – 2015-04-11 (×2): 30 mg via ORAL
  Filled 2015-04-10 (×2): qty 1

## 2015-04-10 MED ORDER — PROMETHAZINE HCL 25 MG/ML IJ SOLN: 12.5000 mg | Freq: Four times a day (QID) | INTRAMUSCULAR | Status: DC | PRN

## 2015-04-10 MED ORDER — POLYVINYL ALCOHOL 1.4 % OP SOLN
1.0000 [drp] | OPHTHALMIC | Status: DC | PRN
  Filled 2015-04-10: qty 15

## 2015-04-10 MED ORDER — ASPIRIN EC 81 MG PO TBEC
81.0000 mg | DELAYED_RELEASE_TABLET | Freq: Every day | ORAL | Status: DC
  Administered 2015-04-10 – 2015-04-11 (×2): 81 mg via ORAL
  Filled 2015-04-10 (×2): qty 1

## 2015-04-10 MED ORDER — NITROGLYCERIN 0.4 MG SL SUBL: 0.4000 mg | SUBLINGUAL_TABLET | SUBLINGUAL | Status: DC | PRN

## 2015-04-10 MED ORDER — SPIRONOLACTONE 25 MG PO TABS
25.0000 mg | ORAL_TABLET | Freq: Every day | ORAL | Status: DC
  Administered 2015-04-10 – 2015-04-11 (×2): 25 mg via ORAL
  Filled 2015-04-10 (×2): qty 1

## 2015-04-10 MED ORDER — PROMETHAZINE HCL 25 MG PO TABS: 12.5000 mg | ORAL_TABLET | Freq: Four times a day (QID) | ORAL | Status: DC | PRN

## 2015-04-10 MED ORDER — INSULIN ASPART 100 UNIT/ML ~~LOC~~ SOLN
0.0000 [IU] | Freq: Three times a day (TID) | SUBCUTANEOUS | Status: DC
  Administered 2015-04-10 (×2): 1 [IU] via SUBCUTANEOUS
  Administered 2015-04-11: 2 [IU] via SUBCUTANEOUS

## 2015-04-10 MED ORDER — IBUPROFEN 800 MG PO TABS
800.0000 mg | ORAL_TABLET | Freq: Three times a day (TID) | ORAL | Status: DC | PRN
  Administered 2015-04-10: 800 mg via ORAL
  Filled 2015-04-10: qty 1

## 2015-04-10 MED ORDER — PROMETHAZINE HCL 12.5 MG RE SUPP: 12.5000 mg | Freq: Four times a day (QID) | RECTAL | Status: DC | PRN

## 2015-04-10 MED ORDER — POLYMYXIN B-TRIMETHOPRIM 10000-0.1 UNIT/ML-% OP SOLN
1.0000 [drp] | Freq: Three times a day (TID) | OPHTHALMIC | Status: DC
  Filled 2015-04-10: qty 10

## 2015-04-10 MED ORDER — HEPARIN (PORCINE) IN NACL 100-0.45 UNIT/ML-% IJ SOLN
1800.0000 [IU]/h | INTRAMUSCULAR | Status: DC
  Administered 2015-04-10: 1600 [IU]/h via INTRAVENOUS
  Administered 2015-04-11: 1800 [IU]/h via INTRAVENOUS
  Filled 2015-04-10 (×2): qty 250

## 2015-04-10 NOTE — Progress Notes (Addendum)
ANTICOAGULATION CONSULT NOTE - Initial Consult  Pharmacy Consult for Xarelto Indication: atrial fibrillation  No Known Allergies  Patient Measurements: Height: 6\' 3"  (190.5 cm) Weight: 281 lb 3.2 oz (127.551 kg) IBW/kg (Calculated) : 84.5  Vital Signs: Temp: 97.8 F (36.6 C) (05/12 0126) Temp Source: Oral (05/12 0126) BP: 140/68 mmHg (05/12 0126) Pulse Rate: 76 (05/12 0126)  Labs:  Recent Labs  04/09/15 2156  HGB 13.7  HCT 41.2  PLT 161  CREATININE 0.89  TROPONINI <0.03    Estimated Creatinine Clearance: 130.1 mL/min (by C-G formula based on Cr of 0.89).   Medical History: Past Medical History  Diagnosis Date  . TOBACCO ABUSE 11/25/2006  . CORONARY ARTERY DISEASE 11/25/2006    Cardiac cath by Dr Terrence Dupont 11/2003 LV showed good LV systolic function, EF of 08-14%. Left main was patent. LAD has 20-30% mid stenosis. Diagonal 1 and diagonal 2 were patent. Left circumflex was patent. OM1 was less than 0.5 mm which was diffusely diseased. OM2 has 20% ostial stenosis which was patent which  was moderate size. OM3 was patent at prior PTCA and stented site. RCA has 20-30% proximal   . Hypertension   . Atrial arrhythmia   . Type II diabetes mellitus   . Cellulitis of knee, right 06/06/12  . Chronic otitis externa 06/06/12    C Multiple trials with antibiocs and antifungal. Cultures were positive for Pseudomonas. Follow up with ENT on 06/14/12    . NSTEMI (non-ST elevated myocardial infarction) 08/14/2012    S/p Successful PTCA to proximal OM-3      Assessment: 59 y/o here with multiple complaints, Xarelto PTA for afib (last dose 5/11), CBC/renal function good, other labs as above.   Goal of Therapy:  Monitor platelets by anticoagulation protocol: Yes   Plan:  -Continue Xarelto 20 mg daily with supper -Minimum q72h CBC while inpatient -Monitor for bleeding  [name], [name] 04/10/2015,2:01 AM  ========= Addendum 2:16 AM Decision made to hold Xarelto pending further work-up

## 2015-04-10 NOTE — Progress Notes (Signed)
ANTICOAGULATION CONSULT NOTE - Initial Consult  Pharmacy Consult for Heparin Indication: atrial fibrillation, chest pain  No Known Allergies  Patient Measurements: Height: 6\' 3"  (190.5 cm) Weight: 281 lb 3.2 oz (127.551 kg) IBW/kg (Calculated) : 84.5 Heparin Dosing Weight: ~112kg  Vital Signs: Temp: 97.7 F (36.5 C) (05/12 0754) Temp Source: Oral (05/12 0754) BP: 117/59 mmHg (05/12 0754) Pulse Rate: 56 (05/12 0754)  Labs:  Recent Labs  04/09/15 2156 04/10/15 0230 04/10/15 0940  HGB 13.7  --   --   HCT 41.2  --   --   PLT 161  --   --   CREATININE 0.89  --   --   TROPONINI <0.03 <0.03 <0.03    Estimated Creatinine Clearance: 130.1 mL/min (by C-G formula based on Cr of 0.89).   Medical History: Past Medical History  Diagnosis Date  . TOBACCO ABUSE 11/25/2006  . CORONARY ARTERY DISEASE 11/25/2006    Cardiac cath by Dr Terrence Dupont 11/2003 LV showed good LV systolic function, EF of 42-35%. Left main was patent. LAD has 20-30% mid stenosis. Diagonal 1 and diagonal 2 were patent. Left circumflex was patent. OM1 was less than 0.5 mm which was diffusely diseased. OM2 has 20% ostial stenosis which was patent which  was moderate size. OM3 was patent at prior PTCA and stented site. RCA has 20-30% proximal   . Hypertension   . Atrial arrhythmia   . Type II diabetes mellitus   . Cellulitis of knee, right 06/06/12  . Chronic otitis externa 06/06/12    C Multiple trials with antibiocs and antifungal. Cultures were positive for Pseudomonas. Follow up with ENT on 06/14/12    . NSTEMI (non-ST elevated myocardial infarction) 08/14/2012    S/p Successful PTCA to proximal OM-3      Assessment: 59yom with hx CAD s/p stenting and on xarelto pta for afib presents with chest pain. Xarelto placed on hold and he will be bridged with heparin pending stress test tomorrow. Last xarelto dose per patient was yesterday morning. Since xarelto falsely elevates heparin levels, will use aPTT to guide initial  heparin dosing.   Goal of Therapy:  APTT 66-102 seconds Heparin level 0.3-0.7 units/ml Monitor platelets by anticoagulation protocol: Yes   Plan:  1) Obtain baseline aPTT and heparin level 2) Begin heparin at 1600 units/hr with no bolus 3) Check 6 hour aPTT, heparin level 4) Daily aPTT, heparin level, CBC  Deboraha Sprang 04/10/2015,12:36 PM

## 2015-04-10 NOTE — Consult Note (Signed)
Reason for Consult: Chest pain Referring Physician: Teaching service  [name] is an 59 y.o. male.  HPI: Patient is 59 year old male with past medical history significant for coronary artery disease status post PTCA stenting to OM 3 in 2007 subsequently had PCI in 2013 to OM 3, hypertension, non-insulin-dependent diabetes mellitus, history of paroxysmal atrial fibrillation, on chronic anticoagulation, history of tobacco abuse, GERD, degenerative joint disease, was admitted last night because of recurrent retrosternal and left-sided chest pain described as burning pain radiating to the right arm without associated symptoms. Patient denies any nausea vomiting diaphoresis denies palpitation lightheadedness or syncope. 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 EKG done in the ED showed normal sinus rhythm with ST-T wave changes in inferolateral leads 3 sets of cardiac enzymes have been negative patient is chest pain-free now. Patient also complains of chronic drainage from both the years and being referred to ENT for further evaluation.   Past Medical History  Diagnosis Date  . TOBACCO ABUSE 11/25/2006  . CORONARY ARTERY DISEASE 11/25/2006    Cardiac cath by Dr Terrence Dupont 11/2003 LV showed good LV systolic function, EF of 37-48%. Left main was patent. LAD has 20-30% mid stenosis. Diagonal 1 and diagonal 2 were patent. Left circumflex was patent. OM1 was less than 0.5 mm which was diffusely diseased. OM2 has 20% ostial stenosis which was patent which  was moderate size. OM3 was patent at prior PTCA and stented site. RCA has 20-30% proximal   . Hypertension   . Atrial arrhythmia   . Type II diabetes mellitus   . Cellulitis of knee, right 06/06/12  . Chronic otitis externa 06/06/12    C Multiple trials with antibiocs and antifungal. Cultures were positive for Pseudomonas. Follow up with ENT on 06/14/12    .  NSTEMI (non-ST elevated myocardial infarction) 08/14/2012    S/p Successful PTCA to proximal OM-3       Past Surgical History  Procedure Laterality Date  . Coronary angioplasty with stent placement  ~ 2005    "1"  . Cystectomy  1990's    LUA  . Left heart catheterization with coronary angiogram N/A 08/15/2012    Procedure: LEFT HEART CATHETERIZATION WITH CORONARY ANGIOGRAM;  Surgeon: Clent Demark, MD;  Location: [facility] CATH LAB;  Service: Cardiovascular;  Laterality: N/A;  . Percutaneous coronary stent intervention (pci-s)  08/15/2012    Procedure: PERCUTANEOUS CORONARY STENT INTERVENTION (PCI-S);  Surgeon: Clent Demark, MD;  Location: [facility] CATH LAB;  Service: Cardiovascular;;    No family history on file.  Social History:  reports that he quit smoking about 4 months ago. His smoking use included Cigarettes. He has a 2.4 pack-year smoking history. He has never used smokeless tobacco. He reports that he does not drink alcohol or use illicit drugs.  Allergies: No Known Allergies  Medications: I have reviewed the patient's current medications.  Results for orders placed or performed during the hospital encounter of 04/09/15 (from the past 48 hour(s))  Basic metabolic panel     Status: Abnormal   Collection Time: 04/09/15  9:56 PM  Result Value Ref Range   Sodium 141 135 - 145 mmol/L   Potassium 3.6 3.5 - 5.1 mmol/L   Chloride 106 101 - 111 mmol/L   CO2 22 22 - 32 mmol/L   Glucose, Bld 189 (H) 70 - 99 mg/dL   BUN 17 6 - 20 mg/dL  Creatinine, Ser 0.89 0.61 - 1.24 mg/dL   Calcium 9.1 8.9 - 10.3 mg/dL   GFR calc non Af Amer >60 >60 mL/min   GFR calc Af Amer >60 >60 mL/min    Comment: (NOTE) The eGFR has been calculated using the CKD EPI equation. This calculation has not been validated in all clinical situations. eGFR's persistently <60 mL/min signify possible Chronic Kidney Disease.    Anion gap 13 5 - 15  Troponin I     Status: None   Collection Time: 04/09/15  9:56 PM  Result  Value Ref Range   Troponin I <0.03 <0.031 ng/mL    Comment:        NO INDICATION OF MYOCARDIAL INJURY.   CBC with Differential     Status: Abnormal   Collection Time: 04/09/15  9:56 PM  Result Value Ref Range   WBC 6.8 4.0 - 10.5 K/uL   RBC 4.60 4.22 - 5.81 MIL/uL   Hemoglobin 13.7 13.0 - 17.0 g/dL   HCT 41.2 39.0 - 52.0 %   MCV 89.6 78.0 - 100.0 fL   MCH 29.8 26.0 - 34.0 pg   MCHC 33.3 30.0 - 36.0 g/dL   RDW 14.5 11.5 - 15.5 %   Platelets 161 150 - 400 K/uL   Neutrophils Relative % 41 (L) 43 - 77 %   Neutro Abs 2.8 1.7 - 7.7 K/uL   Lymphocytes Relative 51 (H) 12 - 46 %   Lymphs Abs 3.4 0.7 - 4.0 K/uL   Monocytes Relative 4 3 - 12 %   Monocytes Absolute 0.3 0.1 - 1.0 K/uL   Eosinophils Relative 4 0 - 5 %   Eosinophils Absolute 0.2 0.0 - 0.7 K/uL   Basophils Relative 0 0 - 1 %   Basophils Absolute 0.0 0.0 - 0.1 K/uL  Urine rapid drug screen (hosp performed)     Status: Abnormal   Collection Time: 04/10/15  1:00 AM  Result Value Ref Range   Opiates POSITIVE (A) NONE DETECTED   Cocaine NONE DETECTED NONE DETECTED   Benzodiazepines NONE DETECTED NONE DETECTED   Amphetamines NONE DETECTED NONE DETECTED   Tetrahydrocannabinol NONE DETECTED NONE DETECTED   Barbiturates NONE DETECTED NONE DETECTED    Comment:        DRUG SCREEN FOR MEDICAL PURPOSES ONLY.  IF CONFIRMATION IS NEEDED FOR ANY PURPOSE, NOTIFY LAB WITHIN 5 DAYS.        LOWEST DETECTABLE LIMITS FOR URINE DRUG SCREEN Drug Class       Cutoff (ng/mL) Amphetamine      1000 Barbiturate      200 Benzodiazepine   993 Tricyclics       716 Opiates          300 Cocaine          300 THC              50   Troponin I-serum (0, 3, 6 hours)     Status: None   Collection Time: 04/10/15  2:30 AM  Result Value Ref Range   Troponin I <0.03 <0.031 ng/mL    Comment:        NO INDICATION OF MYOCARDIAL INJURY.   Glucose, capillary     Status: Abnormal   Collection Time: 04/10/15  9:29 AM  Result Value Ref Range    Glucose-Capillary 157 (H) 65 - 99 mg/dL  Troponin I-serum (0, 3, 6 hours)     Status: None   Collection Time: 04/10/15  9:40  AM  Result Value Ref Range   Troponin I <0.03 <0.031 ng/mL    Comment:        NO INDICATION OF MYOCARDIAL INJURY.   Glucose, capillary     Status: Abnormal   Collection Time: 04/10/15 11:35 AM  Result Value Ref Range   Glucose-Capillary 150 (H) 65 - 99 mg/dL    Dg Chest 2 View  04/09/2015   CLINICAL DATA:  Left upper chest pain.  Shortness of breath.  EXAM: CHEST  2 VIEW  COMPARISON:  02/22/2013  FINDINGS: Linear scarring at the right lung base. Faint chronic interstitial accentuation. Old left rib deformities likely from old fracture. Thoracic spondylosis.  The lungs appear otherwise clear. No airspace opacity. No pleural effusion.  IMPRESSION: 1. Faint chronic interstitial accentuation. Mild scarring at the right lung base. No acute findings.   Electronically Signed   By: Van Clines M.D.   On: 04/09/2015 23:00    Review of Systems  Constitutional: Negative for fever and chills.  Eyes: Negative for double vision and photophobia.  Respiratory: Negative for cough, hemoptysis, sputum production and shortness of breath.   Cardiovascular: Positive for chest pain. Negative for palpitations, orthopnea, claudication and leg swelling.  Gastrointestinal: Negative for nausea, vomiting and abdominal pain.  Neurological: Negative for dizziness and headaches.   Blood pressure 117/59, pulse 56, temperature 97.7 F (36.5 C), temperature source Oral, resp. rate 16, height $RemoveBe'6\' 3"'UzehsNXBV$  (1.905 m), weight 127.551 kg (281 lb 3.2 oz), SpO2 99 %. Physical Exam  Constitutional: He is oriented to person, place, and time.  HENT:  Head: Normocephalic and atraumatic.  Eyes: Conjunctivae are normal. Left eye exhibits discharge.  Neck: Normal range of motion. Neck supple. No JVD present.  Cardiovascular: Normal rate and regular rhythm.  Exam reveals no gallop.   No murmur  heard. Respiratory: Effort normal and breath sounds normal. No respiratory distress. He has no wheezes. He has no rales.  GI: Soft. Bowel sounds are normal. He exhibits no distension. There is no tenderness. There is no rebound and no guarding.  Musculoskeletal: He exhibits no edema or tenderness.  Neurological: He is alert and oriented to person, place, and time.    Assessment/Plan: New-onset angina MI ruled out rule out progression of CAD Coronary artery disease history of non-Q-wave MI in the past status post PCI to OM 3 Hypertension Diabetes mellitus History of paroxysmal A. fib GERD Degenerative joint disease Bilateral otitis externa History of tobacco abuse Plan Start heparin per pharmacy protocol DC NSAIDs Schedule for nuclear stress test in a.m. Patient may resume heart healthy carb. modified diet  Nothing by mouth after midnight   [name] 04/10/2015, 11:59 AM

## 2015-04-10 NOTE — Progress Notes (Signed)
Subjective:    The patient reports having burning chest pain this morning that did not respond to a GI cocktail but did go away after receiving morphine. He reports feeling well otherwise. He denies eye or ear pain currently.  Interval Events: -Vital signs stable overnight.    Objective:    Vital Signs:   Temp:  [97.7 F (36.5 C)-99.1 F (37.3 C)] 97.7 F (36.5 C) (05/12 0754) Pulse Rate:  [56-88] 56 (05/12 0754) Resp:  [15-23] 16 (05/12 0754) BP: (113-144)/(50-73) 117/59 mmHg (05/12 0754) SpO2:  [92 %-99 %] 99 % (05/12 0754) Weight:  [281 lb 3.2 oz (127.551 kg)-285 lb (129.275 kg)] 281 lb 3.2 oz (127.551 kg) (05/12 0126) Last BM Date: 04/09/15  24-hour weight change: Weight change:   Intake/Output:  No intake or output data in the 24 hours ending 04/10/15 1151    Physical Exam: General: Well-developed, well-nourished, in no acute distress; alert, appropriate and cooperative throughout examination. Minimal conjunctival erythema noted.  Lungs:  Normal respiratory effort. Clear to auscultation BL without crackles or wheezes.  Heart: RRR. S1 and S2 normal without gallop, murmur, or rubs.  Abdomen:  BS normoactive. Soft, Nondistended, non-tender.  No masses or organomegaly.  Extremities: No pretibial edema.     Labs:  Basic Metabolic Panel:  Recent Labs Lab 04/09/15 2156  NA 141  K 3.6  CL 106  CO2 22  GLUCOSE 189*  BUN 17  CREATININE 0.89  CALCIUM 9.1   CBC:  Recent Labs Lab 04/09/15 2156  WBC 6.8  NEUTROABS 2.8  HGB 13.7  HCT 41.2  MCV 89.6  PLT 161    Cardiac Enzymes:  Recent Labs Lab 04/09/15 2156 04/10/15 0230 04/10/15 0940  TROPONINI <0.03 <0.03 <0.03   CBG:  Recent Labs Lab 04/10/15 0929 04/10/15 1135  GLUCAP 157* 150*    Microbiology: Results for orders placed or performed in visit on 09/12/14  Group A Strep Screen (Direct)     Status: None   Collection Time: 09/12/14  4:13 PM  Result Value Ref Range Status   Source ;   Final   Streptococcus, Group A Screen (Direct) NEG NEGATIVE Final    Comment:   A Rapid Antigen test may result negative if the antigen level in the sample is below the detection level of this test. The FDA has not cleared this test as a stand-alone test therefore the rapid antigen negative result has reflexed to a Group A Strep culture, unit code 70060.     Culture, Group A Strep     Status: None   Collection Time: 09/12/14  4:13 PM  Result Value Ref Range Status   Organism ID, Bacteria Normal Upper Respiratory Flora  Final   Organism ID, Bacteria No Beta Hemolytic Streptococci Isolated  Final    Coagulation Studies: No results for input(s): LABPROT, INR in the last 72 hours.   Other results: EKG: sinus bradycardia, continued diffuse T-wave inversion.  Imaging: Dg Chest 2 View  04/09/2015   CLINICAL DATA:  Left upper chest pain.  Shortness of breath.  EXAM: CHEST  2 VIEW  COMPARISON:  02/22/2013  FINDINGS: Linear scarring at the right lung base. Faint chronic interstitial accentuation. Old left rib deformities likely from old fracture. Thoracic spondylosis.  The lungs appear otherwise clear. No airspace opacity. No pleural effusion.  IMPRESSION: 1. Faint chronic interstitial accentuation. Mild scarring at the right lung base. No acute findings.   Electronically Signed   By: Cindra Eves.D.  On: 04/09/2015 23:00       Medications:    Infusions:    Scheduled Medications: . aspirin EC  81 mg Oral Daily  . insulin aspart  0-9 Units Subcutaneous TID WC  . isosorbide mononitrate  30 mg Oral Daily  . lisinopril  5 mg Oral Daily  . pravastatin  40 mg Oral q1800  . sotalol  40 mg Oral BID  . spironolactone  25 mg Oral Daily  . trimethoprim-polymyxin b  1 drop Both Eyes TID AC & HS    PRN Medications: acetaminophen, gi cocktail, ibuprofen, morphine injection, nitroGLYCERIN, promethazine **OR** promethazine **OR** promethazine   Assessment/ Plan:    Active  Problems:   Chest pain  #Chest pain with history of coronary artery disease The patient continues to have burning chest pain that responds to morphine but not a GI cocktail. His description of his pain is suggestive of indigestion, but he reports the pain is similar to his prior NSTEMI, which is concerning. Troponins have remained negative. He follows with Dr. Terrence Dupont. UDS positive for opiates which he received in the ER. -Consulted Dr. Terrence Dupont this morning. -Keep patient nothing by mouth until seen by cardiology unless they cannot see him until this afternoon. -Continue telemetry. -Continue home aspirin 81 mg daily. -Continue home sotalol 40 mg twice a day. -Continue GI cocktail 4 times daily when necessary. -Continue morphine 1 mg every 2 hours when necessary. -Continue sublingual nitroglycerin when necessary. -Continue to trend troponins. -Continue pravastatin 40 mg daily. -Continue Phenergan 12.5 mg every 6 hours when necessary.  #Conjunctivitis Very little discharge from eyes, bilateral nature suggests viral conjunctivitis vs. Non-specific irritation. -Stop trimethoprim-polymyxin B ophthalmic drops. -Artificial tears PRN.  #Right lower back pain Previously seen in pain clinic, but discharged. -Continue morphine as above.  #Chronic otitis externa Appears stable from prior. Appointment scheduled with ENT next week. -Follow-up with ENT as an outpatient.  #Type 2 diabetes:  Blood sugar well controlled. -Hold home glipizide 10 mg daily and metformin 1000 g twice a day. -Continue CBGs and sliding scale insulin sensitive with meals.  #Hypertension Patient remains normotensive. -Continue home lisinopril 5 mg daily, isosorbide 30 mg daily, spironolactone 25 mg daily.  #Atrial fibrillation Remains in sinus rhythm. -Continue home sotalol 40 mg twice a day. -Continue to hold Xarelto for possible cardiac catheterization.   DVT PPX - SCD's while in bed  CODE STATUS -  Full.  CONSULTS PLACED - Cardiology.  DISPO - Disposition is deferred at this time, awaiting cardiology consult, possible D/C today or tomorrow.   The patient does have a current PCP Eulas Post, Brett Canales, MD) and does need an [facility] hospital follow-up appointment after discharge.    Is the [facility] hospital follow-up appointment a one-time only appointment? no.  Does the patient have transportation limitations that hinder transportation to clinic appointments? no   SERVICE NEEDED AT [place]         Y = Yes, Blank = No PT:   OT:   RN:   Equipment:   Other:      Length of Stay:  day(s)   Signed: Charlesetta Shanks, MD  PGY-1, Internal Medicine Resident Pager: [phone] (7AM-5PM) 04/10/2015, 11:51 AM

## 2015-04-11 ENCOUNTER — Observation Stay (HOSPITAL_COMMUNITY): Payer: 59

## 2015-04-11 DIAGNOSIS — H109 Unspecified conjunctivitis: Secondary | ICD-10-CM | POA: Diagnosis not present

## 2015-04-11 DIAGNOSIS — R079 Chest pain, unspecified: Secondary | ICD-10-CM | POA: Insufficient documentation

## 2015-04-11 DIAGNOSIS — H9213 Otorrhea, bilateral: Secondary | ICD-10-CM | POA: Diagnosis not present

## 2015-04-11 DIAGNOSIS — R011 Cardiac murmur, unspecified: Secondary | ICD-10-CM | POA: Diagnosis not present

## 2015-04-11 DIAGNOSIS — M545 Low back pain: Secondary | ICD-10-CM | POA: Diagnosis not present

## 2015-04-11 LAB — NM MYOCAR MULTI W/SPECT W/WALL MOTION / EF
CHL CUP NUCLEAR SDS: 0
CHL CUP NUCLEAR SSS: 3
LHR: 0.25
LV dias vol: 139 mL
LV sys vol: 58 mL
NUC STRESS EF: 58 %
SRS: 3
TID: 1.03

## 2015-04-11 LAB — CBC
HEMATOCRIT: 39 % (ref 39.0–52.0)
Hemoglobin: 12.6 g/dL — ABNORMAL LOW (ref 13.0–17.0)
MCH: 29.4 pg (ref 26.0–34.0)
MCHC: 32.3 g/dL (ref 30.0–36.0)
MCV: 90.9 fL (ref 78.0–100.0)
Platelets: 138 10*3/uL — ABNORMAL LOW (ref 150–400)
RBC: 4.29 MIL/uL (ref 4.22–5.81)
RDW: 15 % (ref 11.5–15.5)
WBC: 6.9 10*3/uL (ref 4.0–10.5)

## 2015-04-11 LAB — GLUCOSE, CAPILLARY
Glucose-Capillary: 152 mg/dL — ABNORMAL HIGH (ref 65–99)
Glucose-Capillary: 181 mg/dL — ABNORMAL HIGH (ref 65–99)

## 2015-04-11 LAB — HEPARIN LEVEL (UNFRACTIONATED): HEPARIN UNFRACTIONATED: 0.45 [IU]/mL (ref 0.30–0.70)

## 2015-04-11 LAB — APTT: APTT: 75 s — AB (ref 24–37)

## 2015-04-11 MED ORDER — TECHNETIUM TC 99M SESTAMIBI GENERIC - CARDIOLITE
10.0000 | Freq: Once | INTRAVENOUS | Status: AC | PRN
  Administered 2015-04-11: 10 via INTRAVENOUS

## 2015-04-11 MED ORDER — POLYVINYL ALCOHOL 1.4 % OP SOLN: 1.0000 [drp] | OPHTHALMIC | Status: DC | PRN

## 2015-04-11 MED ORDER — RIVAROXABAN 20 MG PO TABS
20.0000 mg | ORAL_TABLET | Freq: Every day | ORAL | Status: DC
  Administered 2015-04-11: 20 mg via ORAL
  Filled 2015-04-11: qty 1

## 2015-04-11 MED ORDER — TECHNETIUM TC 99M SESTAMIBI - CARDIOLITE
30.0000 | Freq: Once | INTRAVENOUS | Status: AC | PRN
  Administered 2015-04-11: 11:00:00 30 via INTRAVENOUS

## 2015-04-11 MED ORDER — REGADENOSON 0.4 MG/5ML IV SOLN
INTRAVENOUS | Status: AC
  Filled 2015-04-11: qty 5

## 2015-04-11 MED ORDER — REGADENOSON 0.4 MG/5ML IV SOLN
0.4000 mg | Freq: Once | INTRAVENOUS | Status: AC
  Administered 2015-04-11: 0.4 mg via INTRAVENOUS
  Filled 2015-04-11: qty 5

## 2015-04-11 NOTE — Progress Notes (Signed)
Subjective:    The patient reports feeling well this morning. He denies any chest pain, shortness of breath, nausea, or indigestion currently.  Interval Events: -Vital signs stable overnight.    Objective:    Vital Signs:   Temp:  [97.8 F (36.6 C)-98.1 F (36.7 C)] 97.8 F (36.6 C) (05/13 0514) Pulse Rate:  [55-61] 61 (05/13 0514) Resp:  [16-18] 18 (05/13 0514) BP: (104-113)/(54-73) 109/54 mmHg (05/13 0514) SpO2:  [95 %-96 %] 95 % (05/13 0514) Weight:  [276 lb 14.4 oz (125.601 kg)] 276 lb 14.4 oz (125.601 kg) (05/13 0514) Last BM Date: 04/09/15  24-hour weight change: Weight change: -8 lb 1.6 oz (-3.674 kg)  Intake/Output:   Intake/Output Summary (Last 24 hours) at 04/11/15 0758 Last data filed at 04/11/15 0200  Gross per 24 hour  Intake    452 ml  Output      0 ml  Net    452 ml      Physical Exam: General: Well-developed, well-nourished, in no acute distress; alert, appropriate and cooperative throughout examination. Minimal conjunctival erythema noted.  Lungs:  Normal respiratory effort. Clear to auscultation BL without crackles or wheezes.  Heart: RRR. S1 and S2 normal without gallop, murmur, or rubs.  Abdomen:  BS normoactive. Soft, Nondistended, non-tender.  No masses or organomegaly.  Extremities: No pretibial edema.     Labs:  Basic Metabolic Panel:  Recent Labs Lab 04/09/15 2156  NA 141  K 3.6  CL 106  CO2 22  GLUCOSE 189*  BUN 17  CREATININE 0.89  CALCIUM 9.1   CBC:  Recent Labs Lab 04/09/15 2156 04/11/15 0225  WBC 6.8 6.9  NEUTROABS 2.8  --   HGB 13.7 12.6*  HCT 41.2 39.0  MCV 89.6 90.9  PLT 161 138*    Cardiac Enzymes:  Recent Labs Lab 04/09/15 2156 04/10/15 0230 04/10/15 0940  TROPONINI <0.03 <0.03 <0.03   CBG:  Recent Labs Lab 04/10/15 0929 04/10/15 1135 04/10/15 1630 04/10/15 2116 04/11/15 0742  GLUCAP 157* 150* 131* 154* 152*    Microbiology: Results for orders placed or performed in visit on 09/12/14   Group A Strep Screen (Direct)     Status: None   Collection Time: 09/12/14  4:13 PM  Result Value Ref Range Status   Source ;  Final   Streptococcus, Group A Screen (Direct) NEG NEGATIVE Final    Comment:   A Rapid Antigen test may result negative if the antigen level in the sample is below the detection level of this test. The FDA has not cleared this test as a stand-alone test therefore the rapid antigen negative result has reflexed to a Group A Strep culture, unit code 70060.     Culture, Group A Strep     Status: None   Collection Time: 09/12/14  4:13 PM  Result Value Ref Range Status   Organism ID, Bacteria Normal Upper Respiratory Flora  Final   Organism ID, Bacteria No Beta Hemolytic Streptococci Isolated  Final    Coagulation Studies: No results for input(s): LABPROT, INR in the last 72 hours.   Other results: EKG: sinus bradycardia, continued diffuse T-wave inversion-unchanged.  Imaging: Dg Chest 2 View  04/09/2015   CLINICAL DATA:  Left upper chest pain.  Shortness of breath.  EXAM: CHEST  2 VIEW  COMPARISON:  02/22/2013  FINDINGS: Linear scarring at the right lung base. Faint chronic interstitial accentuation. Old left rib deformities likely from old fracture. Thoracic spondylosis.  The lungs appear  otherwise clear. No airspace opacity. No pleural effusion.  IMPRESSION: 1. Faint chronic interstitial accentuation. Mild scarring at the right lung base. No acute findings.   Electronically Signed   By: Van Clines M.D.   On: 04/09/2015 23:00       Medications:    Infusions: . heparin 1,800 Units/hr (04/11/15 0252)    Scheduled Medications: . aspirin EC  81 mg Oral Daily  . insulin aspart  0-9 Units Subcutaneous TID WC  . isosorbide mononitrate  30 mg Oral Daily  . lisinopril  5 mg Oral Daily  . pravastatin  40 mg Oral q1800  . sotalol  40 mg Oral BID  . spironolactone  25 mg Oral Daily    PRN Medications: acetaminophen, gi cocktail, morphine  injection, nitroGLYCERIN, polyvinyl alcohol, promethazine **OR** promethazine **OR** promethazine   Assessment/ Plan:    Active Problems:   DM (diabetes mellitus), type 2, uncontrolled   Coronary atherosclerosis   Hypertension   Chest pain  #Chest pain with history of coronary artery disease Chest pain is currently resolved. Dr. Terrence Dupont plans a Myoview this morning. Troponins and EKG unchanged. -Appreciate cardiology recommendations. -Myoview this morning. -Nothing by mouth for Myoview. -Continue telemetry. -Continue home aspirin 81 mg daily. -Continue home sotalol 40 mg twice a day. -Continue GI cocktail 4 times daily when necessary. -Continue morphine 1 mg every 2 hours when necessary. -Continue sublingual nitroglycerin when necessary. -Continue pravastatin 40 mg daily. -Continue Phenergan 12.5 mg every 6 hours when necessary.  #Conjunctivitis Reports minimal improvement. -Continue artificial tears PRN.  #Right lower back pain Stable. -Continue morphine as above.  #Chronic otitis externa -Follow-up with ENT as an outpatient.  #Type 2 diabetes:  Blood sugar well controlled. -Hold home glipizide 10 mg daily and metformin 1000 g twice a day. -Continue CBGs and sliding scale insulin sensitive with meals.  #Hypertension Patient remains normotensive. -Continue home lisinopril 5 mg daily, isosorbide 30 mg daily, spironolactone 25 mg daily.  #Atrial fibrillation Remains in sinus rhythm. -Continue home sotalol 40 mg twice a day. -Continue to hold Xarelto for possible cardiac catheterization.   DVT PPX - SCD's while in bed  CODE STATUS - Full.  CONSULTS PLACED - Cardiology.  DISPO - possible discharge following Myoview today.  The patient does have a current PCP Eulas Post, Brett Canales, MD) and does need an [facility] hospital follow-up appointment after discharge.    Is the [facility] hospital follow-up appointment a one-time only appointment? no.  Does the patient have  transportation limitations that hinder transportation to clinic appointments? no   SERVICE NEEDED AT [place]         Y = Yes, Blank = No PT:   OT:   RN:   Equipment:   Other:      Length of Stay:  day(s)   Signed: Charlesetta Shanks, MD  PGY-1, Internal Medicine Resident Pager: [phone] (7AM-5PM) 04/11/2015, 7:58 AM

## 2015-04-11 NOTE — Discharge Summary (Signed)
Name: [name] MRN: [id number] DOB: [birth year] 59 y.o. PCP: Tyrone Bellows, MD  Date of Admission: 04/09/2015 10:08 PM Date of Discharge: 04/11/2015 Attending Physician: Bartholomew Crews, MD  Discharge Diagnosis: Principal Problem:   Chest pain Active Problems:   DM (diabetes mellitus), type 2, uncontrolled   Coronary atherosclerosis   Atrial fibrillation   Hypertension  Discharge Medications:   Medication List    STOP taking these medications        Benzocaine-Menthol 10-2.1 MG Lozg  Commonly known as:  CEPACOL SORE THROAT     fluocinonide cream 0.05 %  Commonly known as:  LIDEX     ibuprofen 800 MG tablet  Commonly known as:  ADVIL,MOTRIN      TAKE these medications        acetaminophen 500 MG tablet  Commonly known as:  TYLENOL  Take 1 tablet (500 mg total) by mouth every 8 (eight) hours as needed for fever.     aspirin EC 81 MG tablet  Take 1 tablet (81 mg total) by mouth daily.     glipiZIDE 10 MG tablet  Commonly known as:  GLUCOTROL  TAKE ONE TABLET BY MOUTH ONCE DAILY     isosorbide mononitrate 30 MG 24 hr tablet  Commonly known as:  IMDUR  TAKE ONE TABLET BY MOUTH ONCE DAILY     lisinopril 5 MG tablet  Commonly known as:  PRINIVIL,ZESTRIL  Take 1 tablet (5 mg total) by mouth daily.     metFORMIN 1000 MG tablet  Commonly known as:  GLUCOPHAGE  TAKE ONE TABLET BY MOUTH TWICE DAILY WITH MEALS     NITROSTAT 0.4 MG SL tablet  Generic drug:  nitroGLYCERIN  DISSOLVE ONE TABLET UNDER THE TONGUE EVERY 5 MINUTES AS NEEDED FOR CHEST PAIN.  DO NOT EXCEED A TOTAL OF 3 DOSES IN 15 MINUTES     polyvinyl alcohol 1.4 % ophthalmic solution  Commonly known as:  LIQUIFILM TEARS  Place 1 drop into both eyes as needed for dry eyes.     pravastatin 40 MG tablet  Commonly known as:  PRAVACHOL  Take 1 tablet (40 mg total) by mouth daily.     selenium sulfide 2.5 % shampoo  Commonly known as:  SELSUN  Apply topically daily as needed for irritation.       sotalol 80 MG tablet  Commonly known as:  BETAPACE  TAKE ONE-HALF TABLET BY MOUTH TWICE DAILY     spironolactone 25 MG tablet  Commonly known as:  ALDACTONE  Take 25 mg by mouth daily.     triamcinolone lotion 0.1 %  Commonly known as:  KENALOG  Apply topically 2 (two) times daily.     XARELTO 20 MG Tabs tablet  Generic drug:  rivaroxaban  Take 20 mg by mouth daily.        Disposition and follow-up:   [name] was discharged from [facility] in Good condition.  At the hospital follow up visit please address:  1.  Resolution of conjunctivitis, follow-up with ENT for otitis externa.  2.  Labs / imaging needed at time of follow-up: None.  3.  Pending labs/ test needing follow-up: None.  Follow-up Appointments: Follow-up Information    Follow up with Tyrone Bellows, MD On 04/17/2015.   Specialty:  Internal Medicine   Why:  2:45 pm   Contact information:   [place] [id number] [phone]  Discharge Instructions: Discharge Instructions    Call MD for:  difficulty breathing, headache or visual disturbances    Complete by:  As directed      Call MD for:  extreme fatigue    Complete by:  As directed      Call MD for:  hives    Complete by:  As directed      Call MD for:  persistant dizziness or light-headedness    Complete by:  As directed      Call MD for:  persistant nausea and vomiting    Complete by:  As directed      Call MD for:  redness, tenderness, or signs of infection (pain, swelling, redness, odor or green/yellow discharge around incision site)    Complete by:  As directed      Call MD for:  severe uncontrolled pain    Complete by:  As directed      Call MD for:  temperature >100.4    Complete by:  As directed      Diet - low sodium heart healthy    Complete by:  As directed      Increase activity slowly    Complete by:  As directed           Thank you for allowing [place] to be involved in your healthcare  while you were hospitalized at [facility].  Please note that there have been changes to your home medications. --> PLEASE LOOK AT YOUR DISCHARGE MEDICATION LIST FOR DETAILS. Please call your PCP if you have any questions or concerns, or any difficulty getting any of your medications.  Please return to the ER if you have worsening of your symptoms or new severe symptoms arise.  Make sure to restart your Xarelto upon returning home.  You can continue use artificial tears for dry discomfort, this should improve over the next few days.  Make sure to attend your appointment with ENT to address your ear drainage.  Consultations: Treatment Team:  Charolette Forward, MD  Procedures Performed:  Dg Chest 2 View  04/09/2015   CLINICAL DATA:  Left upper chest pain.  Shortness of breath.  EXAM: CHEST  2 VIEW  COMPARISON:  02/22/2013  FINDINGS: Linear scarring at the right lung base. Faint chronic interstitial accentuation. Old left rib deformities likely from old fracture. Thoracic spondylosis.  The lungs appear otherwise clear. No airspace opacity. No pleural effusion.  IMPRESSION: 1. Faint chronic interstitial accentuation. Mild scarring at the right lung base. No acute findings.   Electronically Signed   By: Van Clines M.D.   On: 04/09/2015 23:00   Nm Myocar Multi W/spect W/wall Motion / Ef  04/11/2015    The study is normal.  This is a low risk study.  The left ventricular ejection fraction is normal (55-65%).    Admission HPI:  Patient is a 59 year old gentleman with a history of coronary artery disease (status post PCI in 2013), hypertension, paroxysmal atrial fibrillation, type 2 diabetes, chronic otitis externa who presents to the emergency departments for chest pain, ear drainage, and back pain.  Patient states that his chest pain started 2-3 days ago. He states that it is located on the left side of his chest, initially started at a 7 out of 10 in severity, is a burning  sensation, that does not radiate. He states that he thought that it was initially indigestion and took some over-the-counter medicines for this but they did not help. Additionally,  he states that back in 2013, he had very similar symptoms which he thought was indigestion at the time but ended up being a NSTEMI that was treated with PCI by Dr. Terrence Dupont. During this episode, patient states that he forgot to take nitroglycerin for it. 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 He does report some associated dyspnea but denies any nausea or vomiting. He denies any recent unilateral leg swelling but notes that he has had significant leg swelling in the past.  Additionally, patient reports that his been having increased drainage from both of his ears over the last several days. He has a history of chronic otitis externa and was last evaluated in our clinic around 1 month ago. Prior to that, he had finished a course of topical antibiotics, however, these were not continued at his last clinic visit because of concern for a tympanic membrane perforation noticed on physical exam. Patient was referred to ENT. To date, patient has not been evaluated by ENT yet and has a appointment planned for about a week from now. He notes that he has had some diminished hearing in his left ear greater than right ear. He denies any significant pain outside of his ears but does report some discomfort in his pinna. He denies any fevers or chills.  Additionally, patient also reports right-sided back pain that radiates down his leg is consistent with his chronic symptoms. He denies any bowel or bladder incontinence. He states that he is taking NSAIDs for this pain as an outpatient but has had minimal relief. Patient is still able to ambulate.   Additionally, patient has had bilateral eye redness and drainage over the last month. He states that his eyes are crusted over  in the morning. He has tried over-the-counter eyedrops including those with antihistamines with no relief. He denies any associated sneezing or rhinorrhea. He denies any association with seasonal changes.  Otherwise, patient denies any fevers, nausea, vomiting, abdominal pain, constipation, diarrhea, dysuria, or hematuria.  Hospital Course by problem list: Principal Problem:   Chest pain Active Problems:   DM (diabetes mellitus), type 2, uncontrolled   Coronary atherosclerosis   Atrial fibrillation   Hypertension   #Chest pain The patient reported left-sided burning chest pain on admission that he initially thought was indigestion. However, it did not respond to over-the-counter medications or a GI cocktail during the hospitalization. He reported that his chest pain was very similar to his NSTEMI in 2013. As result, EKGs and troponins were trended and found to be negative. His cardiologist Dr. Terrence Dupont was consulted and recommended Myoview stress test, which was a low risk study. His home aspirin, beta blocker, statin, and ACE inhibitor were continued at discharge.  #Conjunctivitis The patient reported bilateral eye redness and drainage for the last month along with I crusting in the morning. It was thought that his symptoms most likely represented a viral versus allergic conjunctivitis. He was given artificial tears during the hospitalization and asked to continue them at discharge. Follow-up was arranged with his PCP for further evaluation.  #Chronic otitis externa The patient reported stable drainage from both of his ears over the last several days without associated pain. He has a history of chronic otitis externa, and he was last evaluated in clinic one month ago. At that time, he is referred to ENT, and he has an appointment next week. He was encouraged to keep the follow-up appointment.  #  Atrial fibrillation The patient was noted to be in sinus rhythm throughout the hospitalization on  his home sotalol 40 mg twice a day. His home Xarelto was held initially for possible cardiac catheterization, and he was bridged with heparin. Heparin was stopped, and his home Xarelto was resumed at discharge.  Discharge Vitals:   BP 146/70 mmHg  Pulse 71  Temp(Src) 97.8 F (36.6 C) (Oral)  Resp 18  Ht 6\' 3"  (1.905 m)  Wt 276 lb 14.4 oz (125.601 kg)  BMI 34.61 kg/m2  SpO2 95%  Discharge Labs:  Results for orders placed or performed during the hospital encounter of 04/09/15 (from the past 24 hour(s))  Glucose, capillary     Status: Abnormal   Collection Time: 04/10/15  4:30 PM  Result Value Ref Range   Glucose-Capillary 131 (H) 65 - 99 mg/dL  Heparin level (unfractionated)     Status: None   Collection Time: 04/10/15  7:13 PM  Result Value Ref Range   Heparin Unfractionated 0.37 0.30 - 0.70 IU/mL  APTT     Status: Abnormal   Collection Time: 04/10/15  7:13 PM  Result Value Ref Range   aPTT 53 (H) 24 - 37 seconds  Glucose, capillary     Status: Abnormal   Collection Time: 04/10/15  9:16 PM  Result Value Ref Range   Glucose-Capillary 154 (H) 65 - 99 mg/dL  CBC     Status: Abnormal   Collection Time: 04/11/15  2:25 AM  Result Value Ref Range   WBC 6.9 4.0 - 10.5 K/uL   RBC 4.29 4.22 - 5.81 MIL/uL   Hemoglobin 12.6 (L) 13.0 - 17.0 g/dL   HCT 39.0 39.0 - 52.0 %   MCV 90.9 78.0 - 100.0 fL   MCH 29.4 26.0 - 34.0 pg   MCHC 32.3 30.0 - 36.0 g/dL   RDW 15.0 11.5 - 15.5 %   Platelets 138 (L) 150 - 400 K/uL  APTT     Status: Abnormal   Collection Time: 04/11/15  2:25 AM  Result Value Ref Range   aPTT 75 (H) 24 - 37 seconds  Heparin level (unfractionated)     Status: None   Collection Time: 04/11/15  2:25 AM  Result Value Ref Range   Heparin Unfractionated 0.45 0.30 - 0.70 IU/mL  Glucose, capillary     Status: Abnormal   Collection Time: 04/11/15  7:42 AM  Result Value Ref Range   Glucose-Capillary 152 (H) 65 - 99 mg/dL  Glucose, capillary     Status: Abnormal   Collection  Time: 04/11/15 12:03 PM  Result Value Ref Range   Glucose-Capillary 181 (H) 65 - 99 mg/dL    Signed: Charlesetta Shanks, MD 04/11/2015, 12:58 PM    Services Ordered on Discharge: None. Equipment Ordered on Discharge: None.

## 2015-04-11 NOTE — Discharge Instructions (Signed)
·   Thank you for allowing [place] to be involved in your healthcare while you were hospitalized at [facility].   Please note that there have been changes to your home medications.  --> PLEASE LOOK AT YOUR DISCHARGE MEDICATION LIST FOR DETAILS.   Please call your PCP if you have any questions or concerns, or any difficulty getting any of your medications.  Please return to the ER if you have worsening of your symptoms or new severe symptoms arise.  Make sure to restart your Xarelto upon returning home.  You can continue use artificial tears for dry discomfort, this should improve over the next few days.  Make sure to attend your appointment with ENT to address your ear drainage.

## 2015-04-11 NOTE — Progress Notes (Signed)
Subjective:  Patient seen in the medicine department.  Tolerated Lexiscan stress test.  Nuclear result still pending.  Denies any further chest pain nor shortness of breath.  Objective:  Vital Signs in the last 24 hours: Temp:  [97.8 F (36.6 C)-98.1 F (36.7 C)] 97.8 F (36.6 C) (05/13 0514) Pulse Rate:  [55-77] 71 (05/13 1032) Resp:  [16-18] 18 (05/13 0514) BP: (100-148)/(54-82) 146/70 mmHg (05/13 1032) SpO2:  [95 %-96 %] 95 % (05/13 0514) Weight:  [125.601 kg (276 lb 14.4 oz)] 125.601 kg (276 lb 14.4 oz) (05/13 0514)  Intake/Output from previous day: 05/12 0701 - 05/13 0700 In: 452 [P.O.:240; I.V.:212] Out: -  Intake/Output from this shift:    Physical Exam: Neck: no adenopathy, no carotid bruit, no JVD and supple, symmetrical, trachea midline Lungs: clear to auscultation bilaterally Heart: regular rate and rhythm, S1, S2 normal, no murmur, click, rub or gallop Abdomen: soft, non-tender; bowel sounds normal; no masses,  no organomegaly Extremities: extremities normal, atraumatic, no cyanosis or edema  Lab Results:  Recent Labs  04/09/15 2156 04/11/15 0225  WBC 6.8 6.9  HGB 13.7 12.6*  PLT 161 138*    Recent Labs  04/09/15 2156  NA 141  K 3.6  CL 106  CO2 22  GLUCOSE 189*  BUN 17  CREATININE 0.89    Recent Labs  04/10/15 0230 04/10/15 0940  TROPONINI <0.03 <0.03   Hepatic Function Panel No results for input(s): PROT, ALBUMIN, AST, ALT, ALKPHOS, BILITOT, BILIDIR, IBILI in the last 72 hours. No results for input(s): CHOL in the last 72 hours. No results for input(s): PROTIME in the last 72 hours.  Imaging: Imaging results have been reviewed and Dg Chest 2 View  04/09/2015   CLINICAL DATA:  Left upper chest pain.  Shortness of breath.  EXAM: CHEST  2 VIEW  COMPARISON:  02/22/2013  FINDINGS: Linear scarring at the right lung base. Faint chronic interstitial accentuation. Old left rib deformities likely from old fracture. Thoracic spondylosis.  The lungs  appear otherwise clear. No airspace opacity. No pleural effusion.  IMPRESSION: 1. Faint chronic interstitial accentuation. Mild scarring at the right lung base. No acute findings.   Electronically Signed   By: Van Clines M.D.   On: 04/09/2015 23:00    Cardiac Studies:  Assessment/Plan:  New-onset angina MI ruled out rule out progression of CAD Coronary artery disease history of non-Q-wave MI in the past status post PCI to OM 3 Hypertension Diabetes mellitus History of paroxysmal A. fib GERD Degenerative joint disease Bilateral otitis externa History of tobacco abuse Plan Continue present management Check nuclear stress test results if negative for ischemia okay to discharge from cardiac point of view . And switch heparin to Xarelto.     Charolette Forward 04/11/2015, 11:09 AM

## 2015-04-11 NOTE — Progress Notes (Signed)
ANTICOAGULATION CONSULT NOTE - Follow Up Consult  Pharmacy Consult for Heparin Indication: atrial fibrillation, chest pain  No Known Allergies  Patient Measurements: Height: 6\' 3"  (190.5 cm) Weight: 276 lb 14.4 oz (125.601 kg) IBW/kg (Calculated) : 84.5 Heparin Dosing Weight: ~112kg  Vital Signs: Temp: 97.8 F (36.6 C) (05/13 0514) Temp Source: Oral (05/13 0514) BP: 109/54 mmHg (05/13 0514) Pulse Rate: 61 (05/13 0514)  Labs:  Recent Labs  04/09/15 2156 04/10/15 0230 04/10/15 0940 04/10/15 1250 04/10/15 1913 04/11/15 0225  HGB 13.7  --   --   --   --  12.6*  HCT 41.2  --   --   --   --  39.0  PLT 161  --   --   --   --  138*  APTT  --   --   --  26 53* 75*  HEPARINUNFRC  --   --   --  0.11* 0.37 0.45  CREATININE 0.89  --   --   --   --   --   TROPONINI <0.03 <0.03 <0.03  --   --   --     Estimated Creatinine Clearance: 129.1 mL/min (by C-G formula based on Cr of 0.89).   Medications:  Heparin @ 1800 units/hr  Assessment: 59yom continues on heparin for afib and chest pain while xarelto on hold. Heparin level is therapeutic and is correlating with aPTT so will use just heparin levels from now on. CBC stable. No bleeding reported. Currently undergoing stress test.   Goal of Therapy:  Heparin level 0.3-0.7 units/ml Monitor platelets by anticoagulation protocol: Yes   Plan:  1) Continue heparin at 1800 units/hr 2) Follow up after stress test  Deboraha Sprang 04/11/2015,10:06 AM

## 2015-04-15 ENCOUNTER — Encounter: Payer: Self-pay | Admitting: *Deleted

## 2015-04-17 ENCOUNTER — Ambulatory Visit (INDEPENDENT_AMBULATORY_CARE_PROVIDER_SITE_OTHER): Payer: 59 | Admitting: Internal Medicine

## 2015-04-17 VITALS — BP 128/73 | HR 61 | Temp 97.7°F | Wt 282.2 lb

## 2015-04-17 DIAGNOSIS — R079 Chest pain, unspecified: Secondary | ICD-10-CM

## 2015-04-17 DIAGNOSIS — I2510 Atherosclerotic heart disease of native coronary artery without angina pectoris: Secondary | ICD-10-CM | POA: Diagnosis not present

## 2015-04-17 DIAGNOSIS — M5416 Radiculopathy, lumbar region: Secondary | ICD-10-CM

## 2015-04-17 DIAGNOSIS — I252 Old myocardial infarction: Secondary | ICD-10-CM

## 2015-04-17 DIAGNOSIS — E119 Type 2 diabetes mellitus without complications: Secondary | ICD-10-CM

## 2015-04-17 DIAGNOSIS — H6093 Unspecified otitis externa, bilateral: Secondary | ICD-10-CM

## 2015-04-17 DIAGNOSIS — Z7982 Long term (current) use of aspirin: Secondary | ICD-10-CM

## 2015-04-17 DIAGNOSIS — R0789 Other chest pain: Secondary | ICD-10-CM

## 2015-04-17 DIAGNOSIS — Z87891 Personal history of nicotine dependence: Secondary | ICD-10-CM

## 2015-04-17 DIAGNOSIS — M545 Low back pain: Secondary | ICD-10-CM

## 2015-04-17 DIAGNOSIS — I4891 Unspecified atrial fibrillation: Secondary | ICD-10-CM | POA: Diagnosis not present

## 2015-04-17 DIAGNOSIS — Z79899 Other long term (current) drug therapy: Secondary | ICD-10-CM

## 2015-04-17 DIAGNOSIS — Z7901 Long term (current) use of anticoagulants: Secondary | ICD-10-CM

## 2015-04-17 DIAGNOSIS — G8929 Other chronic pain: Secondary | ICD-10-CM

## 2015-04-17 DIAGNOSIS — I10 Essential (primary) hypertension: Secondary | ICD-10-CM

## 2015-04-17 DIAGNOSIS — M5410 Radiculopathy, site unspecified: Secondary | ICD-10-CM

## 2015-04-17 LAB — POCT GLYCOSYLATED HEMOGLOBIN (HGB A1C): HEMOGLOBIN A1C: 6.5

## 2015-04-17 LAB — GLUCOSE, CAPILLARY: GLUCOSE-CAPILLARY: 146 mg/dL — AB (ref 65–99)

## 2015-04-17 NOTE — Assessment & Plan Note (Signed)
He was admitted 5/11 with chest pain that was reportedly similar to his NSTEMi in '13. His cardiologist, Dr. Terrence Dupont recommended a stress Myoview, which was low risk. He was continued on his home statin, BB, ACEi, and ASA. Since that time he still endorses some pain, similar to his admission, but he states that the pain has improved.  - He is to f/u w/ Dr. Terrence Dupont in 1-2 weeks.

## 2015-04-17 NOTE — Assessment & Plan Note (Signed)
In regards to his otitis externa, he continues to have scaling and crusting of both external canals that extends into the auditory canals with drainage from both ears. He does endorse hearing loss in both ears with throbbing pain when he lays on them at night. We have tried antibiotic drops in the past without improvement. He has an ENT appt next week on Tues and wants to hold off on any new intervention until he is seen by them next week.  - f/u ENT Tues, 5/23

## 2015-04-17 NOTE — Assessment & Plan Note (Signed)
Lab Results  Component Value Date   HGBA1C 6.5 04/17/2015   HGBA1C 6.5 02/05/2015   HGBA1C 6.2 09/12/2014     Assessment: Diabetes control: good control (HgbA1C at goal) Progress toward A1C goal:  at goal   Plan: Medications:  continue current medications Home glucose monitoring: Frequency: no home glucose monitoring Timing: N/A Instruction/counseling given: no instruction/counseling  Educational resources provided:   Self management tools provided:   Other plans: A1c in 3 mo

## 2015-04-17 NOTE — Assessment & Plan Note (Signed)
He was admitted 5/11 with chest pain that was reportedly similar to his NSTEMi in '13. His cardiologist, Dr. Terrence Dupont recommended a stress Myoview, which was low risk. His pain was felt to me MSK in nature. He was continued on his home statin, BB, ACEi, and ASA. Since that time he still endorses some pain, similar to his admission, but he states that the pain has improved.  - He is to f/u w/ Dr. Terrence Dupont in 1-2 weeks.

## 2015-04-17 NOTE — Patient Instructions (Signed)
Please call and let us know how your ENT appt went.   Return to the clinic in 1 month for a f/u for your ears.   Keep up the good work with your diabetes!   General Instructions:   Thank you for bringing your medicines today. This helps [place] keep you safe from mistakes.   Progress Toward Treatment Goals:  Treatment Goal 04/17/2015  Hemoglobin A1C at goal  Blood pressure -    Self Care Goals & Plans:  Self Care Goal 09/12/2014  Manage my medications take my medicines as prescribed; bring my medications to every visit; refill my medications on time; follow the sick day instructions if I am sick  Monitor my health keep track of my weight; check my feet daily  Eat healthy foods eat more vegetables; eat fruit for snacks and desserts; eat foods that are low in salt; eat baked foods instead of fried foods; drink diet soda or water instead of juice or soda  Be physically active find an activity I enjoy  Other -    Home Blood Glucose Monitoring 04/17/2015  Check my blood sugar no home glucose monitoring  When to check my blood sugar N/A     Care Management & Community Referrals:  Referral 04/17/2015  Referrals made for care management support none needed

## 2015-04-17 NOTE — Assessment & Plan Note (Signed)
He continues to have chronic pain, which has not changed in character. He denies any bladder or bowel incontinence. He is requesting a referral to a chiropractor, as he has been dismissed from the pain clinic due to + UDS for marijuana and +EtOH on repeat testing.  - Referral for chiropractor placed today per pt request.

## 2015-04-17 NOTE — Assessment & Plan Note (Signed)
BP Readings from Last 3 Encounters:  04/17/15 128/73  04/11/15 146/70  03/10/15 142/67    Lab Results  Component Value Date   NA 141 04/09/2015   K 3.6 04/09/2015   CREATININE 0.89 04/09/2015    Assessment: Blood pressure control: controlled Progress toward BP goal:   At goal   Plan: Medications:  continue current medications

## 2015-04-17 NOTE — Progress Notes (Signed)
Patient ID: [name], male   DOB: 01-30-1956, 59 y.o.   MRN: [id number]  Subjective:   Patient ID: [name] male   DOB: Sep 17, 1956 59 y.o.   MRN: [id number]  HPI: [name] is a 59 y.o. M w/ PMH DM2, HTN, NSTEMI, A. Fib, chronic back pain, and recurrent otitis externa presents as a hospital f/u for chest pain.  Please refer to Problem List   Past Medical History  Diagnosis Date  . TOBACCO ABUSE 11/25/2006  . CORONARY ARTERY DISEASE 11/25/2006    Cardiac cath by Dr Terrence Dupont 11/2003 LV showed good LV systolic function, EF of 92-42%. Left main was patent. LAD has 20-30% mid stenosis. Diagonal 1 and diagonal 2 were patent. Left circumflex was patent. OM1 was less than 0.5 mm which was diffusely diseased. OM2 has 20% ostial stenosis which was patent which  was moderate size. OM3 was patent at prior PTCA and stented site. RCA has 20-30% proximal   . Hypertension   . Atrial arrhythmia   . Type II diabetes mellitus   . Cellulitis of knee, right 06/06/12  . Chronic otitis externa 06/06/12    C Multiple trials with antibiocs and antifungal. Cultures were positive for Pseudomonas. Follow up with ENT on 06/14/12    . NSTEMI (non-ST elevated myocardial infarction) 08/14/2012    S/p Successful PTCA to proximal OM-3      Current Outpatient Prescriptions  Medication Sig Dispense Refill  . acetaminophen (TYLENOL) 500 MG tablet Take 1 tablet (500 mg total) by mouth every 8 (eight) hours as needed for fever. (Patient not taking: Reported on 02/05/2015) 30 tablet 0  . aspirin EC 81 MG tablet Take 1 tablet (81 mg total) by mouth daily. (Patient not taking: Reported on 03/10/2015) 90 tablet 3  . glipiZIDE (GLUCOTROL) 10 MG tablet TAKE ONE TABLET BY MOUTH ONCE DAILY 90 tablet 3  . isosorbide mononitrate (IMDUR) 30 MG 24 hr tablet TAKE ONE TABLET BY MOUTH ONCE DAILY 90 tablet 3  . lisinopril (PRINIVIL,ZESTRIL) 5 MG tablet Take 1 tablet (5 mg total) by mouth daily. 90 tablet 3  . metFORMIN (GLUCOPHAGE) 1000  MG tablet TAKE ONE TABLET BY MOUTH TWICE DAILY WITH MEALS (Patient taking differently: TAKE ONE TABLET BY MOUTH once  DAILY WITH MEALS) 180 tablet 0  . NITROSTAT 0.4 MG SL tablet DISSOLVE ONE TABLET UNDER THE TONGUE EVERY 5 MINUTES AS NEEDED FOR CHEST PAIN.  DO NOT EXCEED A TOTAL OF 3 DOSES IN 15 MINUTES 30 tablet 0  . polyvinyl alcohol (LIQUIFILM TEARS) 1.4 % ophthalmic solution Place 1 drop into both eyes as needed for dry eyes. 15 mL 0  . pravastatin (PRAVACHOL) 40 MG tablet Take 1 tablet (40 mg total) by mouth daily. 90 tablet 3  . selenium sulfide (SELSUN) 2.5 % shampoo Apply topically daily as needed for irritation. 118 mL 3  . sotalol (BETAPACE) 80 MG tablet TAKE ONE-HALF TABLET BY MOUTH TWICE DAILY (Patient taking differently: TAKE ONE TABLET BY MOUTH DAILY) 180 tablet 0  . spironolactone (ALDACTONE) 25 MG tablet Take 25 mg by mouth daily.    Marland Kitchen triamcinolone lotion (KENALOG) 0.1 % Apply topically 2 (two) times daily. 60 mL 1  . XARELTO 20 MG TABS tablet Take 20 mg by mouth daily.      No current facility-administered medications for this visit.   No family history on file. History   Social History  . Marital Status: Married    Spouse Name: N/A  .  Number of Children: N/A  . Years of Education: N/A   Social History Main Topics  . Smoking status: Former Smoker -- 0.20 packs/day for 12 years    Types: Cigarettes    Quit date: 11/29/2014  . Smokeless tobacco: Never Used     Comment: quit about 85mths ago  . Alcohol Use: No  . Drug Use: No  . Sexual Activity: Yes   Other Topics Concern  . Not on file   Social History Narrative   Review of Systems: Constitutional: Denies fever, chills, or appetite change.  HEENT: +bilateral ear pain with drainage and decreased hearing  Respiratory: Denies SOB, DOE Cardiovascular: +chest pain and intermittent leg swelling (none today).  Gastrointestinal: Denies nausea, vomiting, abdominal pain, diarrhea, or blood in stool.  Genitourinary:  Denies dysuria.  Musculoskeletal: +low back pain, chronic.  Skin: +crusting of skin at ears bilateraly.  Neurological: Denies dizziness or syncope.  Psychiatric/Behavioral: Denies suicidal ideation, mood changes, confusion.  Objective:  Physical Exam: Filed Vitals:   04/17/15 1505  BP: 128/73  Pulse: 61  Temp: 97.7 F (36.5 C)  TempSrc: Oral  Weight: 282 lb 3.2 oz (128.005 kg)  SpO2: 98%   Constitutional: Vital signs reviewed.  Patient is a well-developed and well-nourished male in no acute distress and cooperative with exam. Alert and oriented x3.  Head: Normocephalic and atraumatic Ear: External ear with crusting and drainage that extending into auditory canal. Unable to visualize tympanic membranes. Pain present on exam. Eyes: PERRL, EOMI Neck: Supple, Trachea midline, normal ROM, no JVD.  Cardiovascular: RRR, no MRG Pulmonary/Chest: Normal respiratory effort, CTAB, no wheezes, rales, or rhonchi Abdominal: Soft. Non-tender, non-distended, bowel sounds are norma Musculoskeletal: No joint deformities. +stiffness Neurological: A&O x3, Strength is normal and symmetric bilaterally, cranial nerve II-XII are grossly intact.  Skin: External ears as noted above.  Psychiatric: Normal mood and affect. Speech and behavior is normal.    Assessment & Plan:   Please refer to Problem List based Assessment and Plan

## 2015-04-17 NOTE — Assessment & Plan Note (Signed)
Pt on Xarelto. In NSR in the hospital and today. Pt follows with Dr. Terrence Dupont.  - Continue Xarelto.

## 2015-04-18 NOTE — Progress Notes (Signed)
Internal Medicine Clinic Attending  Case discussed with Dr. Glenn soon after the resident saw the patient.  We reviewed the resident's history and exam and pertinent patient test results.  I agree with the assessment, diagnosis, and plan of care documented in the resident's note. 

## 2015-04-25 ENCOUNTER — Telehealth: Payer: Self-pay | Admitting: *Deleted

## 2015-04-25 NOTE — Telephone Encounter (Signed)
[name] with [facility] [phone] called - saw pt and placed pt on Cipro 750mg  for 10 days - one every 12 hours. Their office has a note from pharmacy interactio with sotalol - prolong QT interval. Talked with Dr Eulas Post - pt to continue with sotalol and ENT needs to use an alternative for Cipro. Message left [place] ID recording. Left message on pt ID recording to stop Cipro and get in touch with [facility].  Hilda Blades Mayank Teuscher RN 04/24/15 10:45AM

## 2015-05-13 ENCOUNTER — Other Ambulatory Visit: Payer: Self-pay | Admitting: Internal Medicine

## 2015-05-22 ENCOUNTER — Other Ambulatory Visit: Payer: Self-pay | Admitting: *Deleted

## 2015-05-22 DIAGNOSIS — L210 Seborrhea capitis: Secondary | ICD-10-CM

## 2015-05-22 MED ORDER — TRIAMCINOLONE ACETONIDE 0.1 % EX LOTN: TOPICAL_LOTION | Freq: Two times a day (BID) | CUTANEOUS | Status: DC

## 2015-07-10 ENCOUNTER — Encounter: Payer: Self-pay | Admitting: Internal Medicine

## 2015-07-10 ENCOUNTER — Ambulatory Visit (INDEPENDENT_AMBULATORY_CARE_PROVIDER_SITE_OTHER): Payer: 59 | Admitting: Internal Medicine

## 2015-07-10 VITALS — BP 113/67 | HR 53 | Temp 98.0°F | Ht 75.0 in | Wt 281.5 lb

## 2015-07-10 DIAGNOSIS — I4891 Unspecified atrial fibrillation: Secondary | ICD-10-CM | POA: Diagnosis not present

## 2015-07-10 DIAGNOSIS — M5416 Radiculopathy, lumbar region: Principal | ICD-10-CM

## 2015-07-10 DIAGNOSIS — G8929 Other chronic pain: Secondary | ICD-10-CM

## 2015-07-10 DIAGNOSIS — E119 Type 2 diabetes mellitus without complications: Secondary | ICD-10-CM | POA: Diagnosis not present

## 2015-07-10 DIAGNOSIS — M5410 Radiculopathy, site unspecified: Secondary | ICD-10-CM | POA: Diagnosis not present

## 2015-07-10 LAB — GLUCOSE, CAPILLARY: GLUCOSE-CAPILLARY: 69 mg/dL (ref 65–99)

## 2015-07-10 LAB — POCT GLYCOSYLATED HEMOGLOBIN (HGB A1C): Hemoglobin A1C: 6.5

## 2015-07-10 MED ORDER — DICLOFENAC SODIUM 1 % TD GEL: 2.0000 g | Freq: Four times a day (QID) | TRANSDERMAL | Status: DC

## 2015-07-10 MED ORDER — HYDROCODONE-ACETAMINOPHEN 5-325 MG PO TABS
1.0000 | ORAL_TABLET | Freq: Every evening | ORAL | Status: DC | PRN
Start: 2015-07-10 — End: 2015-09-26

## 2015-07-10 MED ORDER — GLIPIZIDE 5 MG PO TABS: 5.0000 mg | ORAL_TABLET | Freq: Every day | ORAL | Status: DC

## 2015-07-10 NOTE — Assessment & Plan Note (Signed)
On xarelto. -f/u with Dr. Terrence Dupont

## 2015-07-10 NOTE — Assessment & Plan Note (Addendum)
HA1c 6/5 today.  He had hypoglycemia in clinic today with a cbg of 64 and on recheck was 69.  He was given some juice and crackers.  He is only taking metformin 1000mg  once daily and glipizide 10mg  daily.   -advised him to take metformin 1000mg  bid and cut back on glipizide to 5mg  daily  -lipid panel -recheck HA1c in 3 months

## 2015-07-10 NOTE — Assessment & Plan Note (Addendum)
Pt states his back pain does not limit his ability to function during the day because he has to work.  He is a Curator.  It does sometimes interfere with his ability to sleep however.  No red flags. Of note, he was going to a pain clinic but was dismissed for inappropriate UDS (+THC and then +ETOH).  He does not want surgery at this time.   MRI from 2014 shows cmpression of the S1 root in the lateral recesses at L5-S1, left>right due to hypertrophy of the facet joints and ligamenta flava and a small broad-based bulge of the disc. 2. Fairly severe left foraminal stenosis. 3. Severe left and moderately severe right facet arthritis with marked edema in the bones and soft tissues around the joints, much more prominent on the left than the right. -will prescribe a short course of vicodin to be taken only at bedtime since his pain is interfering with his ability to sleep -will see if we can get him an epidural injection (he is on xarelto and upon checking with Dr. [name], he should be off this 48 hours prior to the procedure and should wait at least 6 hrs after the procedure to restart or 24 hrs if traumatic)  -voltaren gel PRN  -refer to PT   -will defer chronic mgmt to PCP  -f/u in 1 month

## 2015-07-10 NOTE — Patient Instructions (Signed)
Thank you for your visit today.   Please return to the internal medicine clinic in 1-2 month(s) or sooner if needed.     I have made the following additions/changes to your medications:  You may take vicodin for pain in the evening to help with sleep.   I will also refer you to physical therapy. We will also refer you for an injection in your back.   You should begin taking metformin twice daily.  Also take 1/2 tablet of your current glipizide which would be 5mg --you may cut this in half.   Please be sure to bring all of your medications with you to every visit; this includes herbal supplements, vitamins, eye drops, and any over-the-counter medications.   Should you have any questions regarding your medications and/or any new or worsening symptoms, please be sure to call the clinic at [phone].   If you believe that you are suffering from a life threatening condition or one that may result in the loss of limb or function, then you should call 911 and proceed to the nearest Emergency Department.   A healthy lifestyle and preventative care can promote health and wellness.   Maintain regular health, dental, and eye exams.  Eat a healthy diet. Foods like vegetables, fruits, whole grains, low-fat dairy products, and lean protein foods contain the nutrients you need without too many calories. Decrease your intake of foods high in solid fats, added sugars, and salt. Get information about a proper diet from your caregiver, if necessary.  Regular physical exercise is one of the most important things you can do for your health. Most adults should get at least 150 minutes of moderate-intensity exercise (any activity that increases your heart rate and causes you to sweat) each week. In addition, most adults need muscle-strengthening exercises on 2 or more days a week.   Maintain a healthy weight. The body mass index (BMI) is a screening tool to identify possible weight problems. It provides an  estimate of body fat based on height and weight. Your caregiver can help determine your BMI, and can help you achieve or maintain a healthy weight. For adults 20 years and older:  A BMI below 18.5 is considered underweight.  A BMI of 18.5 to 24.9 is normal.  A BMI of 25 to 29.9 is considered overweight.  A BMI of 30 and above is considered obese.

## 2015-07-10 NOTE — Progress Notes (Signed)
Patient ID: [name], male   DOB: May 25, 1956, 59 y.o.   MRN: [id number]     Subjective:   Patient ID: [name] male    DOB: Dec 12, 1955 59 y.o.    MRN: [id number] Health Maintenance Due: Health Maintenance Due  Topic Date Due  . LIPID PANEL  05/23/2015  . INFLUENZA VACCINE  06/30/2015    _________________________________________________  HPI: [name] is a 59 y.o. male here for a routine f/u visit.  Pt has a PMH outlined below.  Please see problem-based charting assessment and plan for further details of medical issues addressed at today's visit.  PMH: Past Medical History  Diagnosis Date  . TOBACCO ABUSE 11/25/2006  . CORONARY ARTERY DISEASE 11/25/2006    Cardiac cath by Dr Terrence Dupont 11/2003 LV showed good LV systolic function, EF of 10-25%. Left main was patent. LAD has 20-30% mid stenosis. Diagonal 1 and diagonal 2 were patent. Left circumflex was patent. OM1 was less than 0.5 mm which was diffusely diseased. OM2 has 20% ostial stenosis which was patent which  was moderate size. OM3 was patent at prior PTCA and stented site. RCA has 20-30% proximal   . Hypertension   . Atrial arrhythmia   . Type II diabetes mellitus   . Cellulitis of knee, right 06/06/12  . Chronic otitis externa 06/06/12    C Multiple trials with antibiocs and antifungal. Cultures were positive for Pseudomonas. Follow up with ENT on 06/14/12    . NSTEMI (non-ST elevated myocardial infarction) 08/14/2012    S/p Successful PTCA to proximal OM-3       Medications: Current Outpatient Prescriptions on File Prior to Visit  Medication Sig Dispense Refill  . acetaminophen (TYLENOL) 500 MG tablet Take 1 tablet (500 mg total) by mouth every 8 (eight) hours as needed for fever. 30 tablet 0  . aspirin EC 81 MG tablet Take 1 tablet (81 mg total) by mouth daily. 90 tablet 3  . isosorbide mononitrate (IMDUR) 30 MG 24 hr tablet TAKE ONE TABLET BY MOUTH ONCE DAILY 90 tablet 3  . lisinopril (PRINIVIL,ZESTRIL) 5 MG  tablet Take 1 tablet (5 mg total) by mouth daily. 90 tablet 3  . metFORMIN (GLUCOPHAGE) 1000 MG tablet TAKE ONE TABLET BY MOUTH TWICE DAILY WITH MEALS 180 tablet 3  . NITROSTAT 0.4 MG SL tablet DISSOLVE ONE TABLET UNDER THE TONGUE EVERY 5 MINUTES AS NEEDED FOR CHEST PAIN.  DO NOT EXCEED A TOTAL OF 3 DOSES IN 15 MINUTES 30 tablet 0  . polyvinyl alcohol (LIQUIFILM TEARS) 1.4 % ophthalmic solution Place 1 drop into both eyes as needed for dry eyes. 15 mL 0  . pravastatin (PRAVACHOL) 40 MG tablet Take 1 tablet (40 mg total) by mouth daily. 90 tablet 3  . selenium sulfide (SELSUN) 2.5 % shampoo Apply topically daily as needed for irritation. 118 mL 3  . sotalol (BETAPACE) 80 MG tablet TAKE ONE-HALF TABLET BY MOUTH TWICE DAILY (Patient taking differently: TAKE ONE TABLET BY MOUTH DAILY) 180 tablet 0  . spironolactone (ALDACTONE) 25 MG tablet Take 25 mg by mouth daily.    Marland Kitchen triamcinolone lotion (KENALOG) 0.1 % Apply topically 2 (two) times daily. 60 mL 1  . XARELTO 20 MG TABS tablet Take 20 mg by mouth daily.      No current facility-administered medications on file prior to visit.    Allergies: No Known Allergies  FH: No family history on file.  SH: Social History   Social History  .  Marital Status: Married    Spouse Name: N/A  . Number of Children: N/A  . Years of Education: N/A   Social History Main Topics  . Smoking status: Former Smoker -- 0.20 packs/day for 12 years    Types: Cigarettes    Quit date: 11/29/2014  . Smokeless tobacco: Never Used     Comment: quit about 49mths ago  . Alcohol Use: No  . Drug Use: No  . Sexual Activity: Yes   Other Topics Concern  . None   Social History Narrative    Review of Systems: Constitutional: Negative for fever, chills and weight loss.  Eyes: Negative for blurred vision.  Respiratory: Negative for cough and shortness of breath.  Cardiovascular: Negative for chest pain, palpitations and leg swelling.  Gastrointestinal: Negative for  nausea, vomiting, abdominal pain, diarrhea, constipation and blood in stool.  Genitourinary: Negative for dysuria, urgency and frequency.  Musculoskeletal: Negative for myalgias and +back pain.  Neurological: Negative for dizziness, weakness and headaches.     Objective:   Vital Signs: Filed Vitals:   07/10/15 1423  BP: 113/67  Pulse: 53  Temp: 98 F (36.7 C)  TempSrc: Oral  Height: 6\' 3"  (1.905 m)  Weight: 281 lb 8 oz (127.688 kg)  SpO2: 97%     BP Readings from Last 3 Encounters:  07/10/15 113/67  04/17/15 128/73  04/11/15 146/70    Physical Exam: Constitutional: Vital signs reviewed.  Patient is in NAD and cooperative with exam.  Head: Normocephalic and atraumatic. Eyes: EOMI, conjunctivae nl, no scleral icterus.  Neck: Supple. Cardiovascular: RRR, no MRG. Pulmonary/Chest: normal effort, CTAB, no wheezes, rales, or rhonchi. Abdominal: Soft. NT/ND +BS. Neurological: A&O x3, cranial nerves II-XII are grossly intact, moving all extremities. Extremities: 2+DP b/l; no pitting edema. Skin: Warm, dry and intact. No rash.   Assessment & Plan:   Assessment and plan was discussed and formulated with my attending.

## 2015-07-11 LAB — LIPID PANEL
CHOL/HDL RATIO: 3.5 ratio (ref 0.0–5.0)
Cholesterol, Total: 112 mg/dL (ref 100–199)
HDL: 32 mg/dL — AB (ref >39)
LDL CALC: 64 mg/dL (ref 0–99)
Triglycerides: 80 mg/dL (ref 0–149)
VLDL CHOLESTEROL CAL: 16 mg/dL (ref 5–40)

## 2015-07-14 NOTE — Progress Notes (Signed)
Internal Medicine Clinic Attending  Case discussed with Dr. Gill soon after the resident saw the patient.  We reviewed the resident's history and exam and pertinent patient test results.  I agree with the assessment, diagnosis, and plan of care documented in the resident's note.  

## 2015-07-25 ENCOUNTER — Ambulatory Visit
Admission: RE | Admit: 2015-07-25 | Discharge: 2015-07-25 | Disposition: A | Discharge status: Home / Self Care | Payer: 59 | Source: Ambulatory Visit | Attending: Internal Medicine | Admitting: Internal Medicine

## 2015-07-25 DIAGNOSIS — G8929 Other chronic pain: Secondary | ICD-10-CM

## 2015-07-25 DIAGNOSIS — M5416 Radiculopathy, lumbar region: Principal | ICD-10-CM

## 2015-07-25 MED ORDER — METHYLPREDNISOLONE ACETATE 40 MG/ML INJ SUSP (RADIOLOG
120.0000 mg | Freq: Once | INTRAMUSCULAR | Status: AC
  Administered 2015-07-25: 120 mg via EPIDURAL

## 2015-07-25 MED ORDER — IOHEXOL 180 MG/ML  SOLN
1.0000 mL | Freq: Once | INTRAMUSCULAR | Status: DC | PRN
  Administered 2015-07-25: 1 mL via EPIDURAL

## 2015-07-25 NOTE — Discharge Instructions (Signed)
Post Procedure Spinal Discharge Instruction Sheet  1. You may resume a regular diet and any medications that you routinely take (including pain medications).  2. No driving day of procedure.  3. Light activity throughout the rest of the day.  Do not do any strenuous work, exercise, bending or lifting.  The day following the procedure, you can resume normal physical activity but you should refrain from exercising or physical therapy for at least three days thereafter.   Common Side Effects:   Headaches- take your usual medications as directed by your physician.  Increase your fluid intake.  Caffeinated beverages may be helpful.  Lie flat in bed until your headache resolves.   Restlessness or inability to sleep- you may have trouble sleeping for the next few days.  Ask your referring physician if you need any medication for sleep.   Facial flushing or redness- should subside within a few days.   Increased pain- a temporary increase in pain a day or two following your procedure is not unusual.  Take your pain medication as prescribed by your referring physician.   Leg cramps  Please contact our office at [phone] for the following symptoms:  Fever greater than 100 degrees.  Headaches unresolved with medication after 2-3 days.  Increased swelling, pain, or redness at injection site.  Thank you for visiting our office.   MA[facility] [place].

## 2015-08-11 ENCOUNTER — Ambulatory Visit: Payer: 59 | Attending: Internal Medicine | Admitting: Physical Therapy

## 2015-08-11 DIAGNOSIS — M629 Disorder of muscle, unspecified: Secondary | ICD-10-CM | POA: Diagnosis present

## 2015-08-11 DIAGNOSIS — M545 Low back pain, unspecified: Secondary | ICD-10-CM

## 2015-08-11 DIAGNOSIS — M79604 Pain in right leg: Secondary | ICD-10-CM

## 2015-08-11 DIAGNOSIS — R29898 Other symptoms and signs involving the musculoskeletal system: Secondary | ICD-10-CM | POA: Insufficient documentation

## 2015-08-11 DIAGNOSIS — M2560 Stiffness of unspecified joint, not elsewhere classified: Secondary | ICD-10-CM

## 2015-08-11 DIAGNOSIS — G8929 Other chronic pain: Secondary | ICD-10-CM | POA: Diagnosis present

## 2015-08-11 DIAGNOSIS — M2569 Stiffness of other specified joint, not elsewhere classified: Secondary | ICD-10-CM

## 2015-08-11 NOTE — Therapy (Signed)
[place], [place], [address] Phone: [phone]   Fax:  [phone]  Physical Therapy Evaluation  Patient Details  Name: [name] MRN: [id number] Date of Birth: August 01, 1956 Referring Provider:  Jones Bales, MD  Encounter Date: 08/11/2015      PT End of Session - 08/11/15 1438    Visit Number 1   Number of Visits 12   Date for PT Re-Evaluation 09/22/15   PT Start Time 0145   PT Stop Time 0230   PT Time Calculation (min) 45 min   Activity Tolerance Patient tolerated treatment well   Behavior During Therapy [facility] for tasks assessed/performed      Past Medical History  Diagnosis Date  . TOBACCO ABUSE 11/25/2006  . CORONARY ARTERY DISEASE 11/25/2006    Cardiac cath by Dr Terrence Dupont 11/2003 LV showed good LV systolic function, EF of 09-32%. Left main was patent. LAD has 20-30% mid stenosis. Diagonal 1 and diagonal 2 were patent. Left circumflex was patent. OM1 was less than 0.5 mm which was diffusely diseased. OM2 has 20% ostial stenosis which was patent which  was moderate size. OM3 was patent at prior PTCA and stented site. RCA has 20-30% proximal   . Hypertension   . Atrial arrhythmia   . Type II diabetes mellitus   . Cellulitis of knee, right 06/06/12  . Chronic otitis externa 06/06/12    C Multiple trials with antibiocs and antifungal. Cultures were positive for Pseudomonas. Follow up with ENT on 06/14/12    . NSTEMI (non-ST elevated myocardial infarction) 08/14/2012    S/p Successful PTCA to proximal OM-3       Past Surgical History  Procedure Laterality Date  . Coronary angioplasty with stent placement  ~ 2005    "1"  . Cystectomy  1990's    LUA  . Left heart catheterization with coronary angiogram N/A 08/15/2012    Procedure: LEFT HEART CATHETERIZATION WITH CORONARY ANGIOGRAM;  Surgeon: Clent Demark, MD;  Location: [facility] CATH LAB;  Service: Cardiovascular;  Laterality: N/A;  . Percutaneous  coronary stent intervention (pci-s)  08/15/2012    Procedure: PERCUTANEOUS CORONARY STENT INTERVENTION (PCI-S);  Surgeon: Clent Demark, MD;  Location: [facility] CATH LAB;  Service: Cardiovascular;;    There were no vitals filed for this visit.  Visit Diagnosis:  Low back pain of over 3 months duration  Low back pain radiating to both legs  Decreased range of motion of trunk and back  Hamstring tightness of both lower extremities      Subjective Assessment - 08/11/15 1350    Subjective Pt is here for low back pain. Pt states sometimes the pain goes into his legs, staying around lateral thigh. Pt was seen about a year and a half ago here for the same problem. He got a little relief while here. Pt was lifting weights at the gym about 3-4 years ago and he thinks that is what started his pain.   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 not affected   How long can you stand comfortably? not affected   How long can you walk comfortably? not affected   Diagnostic tests x ray 01/2013: MRI 2014 mini disc bulge L4-S1, severe L foraminal stenosis, minimal facet arthritis   Patient  Stated Goals pain relief   Currently in Pain? 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, transitional movements   Pain Relieving Factors nothing    Effect of Pain on Daily Activities pain after work   Multiple Pain Sites Yes   Pain Score 0   Pain Location Leg   Pain Orientation Right;Left   Pain Descriptors / Indicators Sharp   Pain Radiating Towards n/a   Pain  Onset More than a month ago   Aggravating Factors  getting out of bed in the morning   Pain Relieving Factors nothing tried   Effect of Pain on Daily Activities work            [facility] PT Assessment - 08/11/15 1356    Assessment   Medical Diagnosis Chronic radicular low back pain   Onset Date/Surgical Date 10/29/14   Prior Therapy yes   Precautions   Precautions Other (comment)   Precaution Comments atrial fibrillation   Restrictions   Weight Bearing Restrictions No   Balance Screen   Has the patient fallen in the past 6 months No   [place] residence   Living Arrangements Spouse/significant other   Type of Frannie Access Stairs to enter   Entrance Stairs-Number of Steps 4   Prior Function   Level of Independence Independent   Cognition   Overall Cognitive Status Within Functional Limits for tasks assessed   Observation/Other Assessments   Focus on Therapeutic Outcomes (FOTO)  NT   Sensation   Light Touch Not tested   Coordination   Gross Motor Movements are Fluid and Coordinated Not tested   Posture/Postural Control   Posture/Postural Control Postural limitations   Posture Comments hyperlordosis, Right side lower than left (hip)   ROM / Strength   AROM / PROM / Strength AROM;Strength   AROM   AROM Assessment Site Hip;Lumbar   Lumbar Flexion 65   Lumbar Extension 25   Lumbar - Right Side Bend limited 25%with pain   Lumbar - Left Side Bend limited 25% with pain   Lumbar - Right Rotation WFL   Lumbar - Left Rotation WFL   Strength   Overall Strength Deficits   Strength Assessment Site Hip;Knee;Ankle   Right/Left Hip Right;Left   Right Hip Flexion 3+/5   Right Hip Extension 3+/5   Right Hip ABduction 5/5   Left Hip Flexion 3+/5   Left Hip Extension 3+/5   Left Hip ABduction 4/5   Right/Left Knee --  5/5 bilaterally flex/ext   Right/Left Ankle --  5/5 DF   Flexibility   Soft Tissue Assessment /Muscle Length yes    Hamstrings lacking 20 degrees on rt. and 25 left  with 90/90 test   Palpation   Spinal mobility WFL, hyperlordotic curve, L3-L5   Palpation comment no tenderness to piriformis, pain with palpation along iliac crest and along low back, trigger points along low back as well   Straight Leg Raise   Findings Negative   Side  --  bilat.  [place] Adult PT Treatment/Exercise - 08/11/15 1356    Exercises   Exercises Lumbar   Lumbar Exercises: Stretches   Active Hamstring Stretch 1 rep;30 seconds   Active Hamstring Stretch Limitations seated and supine, shown for HEP   Single Knee to Chest Stretch 1 rep;30 seconds   Single Knee to Chest Stretch Limitations shown for HEP   Double Knee to Chest Stretch 1 rep;30 seconds   Double Knee to Chest Stretch Limitations manual overpressure due to pt not feeling the stretch                PT Education - 08/11/15 1452    Education provided Yes   Education Details HEP, POC/PT   Person(s) Educated Patient;Spouse   Methods Explanation;Demonstration;Handout   Comprehension Verbalized understanding;Returned demonstration          PT Short Term Goals - 08/11/15 1439    PT SHORT TERM GOAL #1   Title Pt. will be I with initial HEP   Time 2   Period Weeks   Status New           PT Long Term Goals - 08/11/15 1439    PT LONG TERM GOAL #1   Title Pt. 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 will demo improved hamstring length by 10 degrees bilaterally to provide relief to low back   Time 6   Period Weeks   Status New               Plan - 08/11/15 1453    Clinical Impression Statement Pt. is a 59 year old male who c/o low back pain with pain radiating into both legs along lateral thigh and sometimes anterior. Pt. presents with hip weaknes, impaired flexibility, decreased AROM, and increased pain with work related activites and transitional movements. Pt. would benefit from skilled PT services in order to address current impairments and learn proper body mechanics for work to prevent further injury.   Pt will benefit from skilled therapeutic intervention in order to improve on the following deficits Decreased range of motion;Pain;Improper body mechanics;Impaired flexibility;Decreased mobility;Decreased strength;Postural dysfunction   Rehab Potential Good   PT Frequency 1x / week  per patient due to work schedule   PT Duration 6 weeks   PT Treatment/Interventions Traction;Ultrasound;ADLs/Self  Care Home Management;Passive range of motion;Patient/family education;Dry needling;Taping;Manual techniques;Therapeutic exercise;Moist Heat;Cryotherapy;Functional mobility training;Therapeutic activities;Neuromuscular re-education   PT Next Visit Plan review HEP, stretching passive and active to hamstrings, low back and quadratus, manual trigger point release to low back, traction is pt is willing to try   PT Home Exercise Plan hamstring stretch in sitting or supine, knee to chest stretch    Recommended Other Services Dry needle   Consulted and Agree with Plan of Care Patient;Family member/caregiver         Problem List Patient Active Problem List   Diagnosis Date Noted  . Pain in the chest   . Chest pain 04/10/2015  . Tooth infection 02/05/2015  . Right lumbar radiculitis 11/19/2014  . Insomnia 05/22/2014  . Rash and nonspecific skin eruption 05/22/2014  . Bilateral lower extremity edema 05/22/2014  . Healthcare maintenance 12/13/2013  . Greater trochanteric bursitis of left hip 05/16/2013  . Dysphagia, unspecified(787.20) 05/10/2013  . Lumbosacral spondylosis without myelopathy 04/18/2013  . Lumbar facet arthropathy 04/18/2013  . Sacroiliac joint dysfunction of left side 02/21/2013  . Leg length discrepancy 02/21/2013  . Seborrhea capitis 01/23/2013  . NSTEMI (non-ST elevated myocardial infarction) 08/14/2012  . Tobacco abuse 08/08/2012  . Otitis externa 03/16/2012  . Hypertension 09/25/2011  . GERD 04/08/2010  . Chronic radicular low back pain 07/20/2007  . Diabetes mellitus type 2, controlled 11/25/2006  . HYPOGONADISM 11/25/2006  . Coronary atherosclerosis 11/25/2006  . Atrial fibrillation 11/25/2006   Radonna Ricker, SPT  PAA,JENNIFER 08/11/2015, 3:28 PM  [facility] [address] [place], [place], [address] Phone: [phone]   Fax:  [phone]  Raeford Razor, PT 08/11/2015 3:28 PM Phone: [phone] Fax: [phone]

## 2015-08-18 ENCOUNTER — Ambulatory Visit: Payer: 59 | Admitting: Physical Therapy

## 2015-08-18 DIAGNOSIS — M2560 Stiffness of unspecified joint, not elsewhere classified: Secondary | ICD-10-CM

## 2015-08-18 DIAGNOSIS — M629 Disorder of muscle, unspecified: Secondary | ICD-10-CM

## 2015-08-18 DIAGNOSIS — M79604 Pain in right leg: Secondary | ICD-10-CM

## 2015-08-18 DIAGNOSIS — M2569 Stiffness of other specified joint, not elsewhere classified: Secondary | ICD-10-CM

## 2015-08-18 DIAGNOSIS — M545 Low back pain: Secondary | ICD-10-CM | POA: Diagnosis not present

## 2015-08-18 NOTE — Patient Instructions (Signed)

## 2015-08-18 NOTE — Therapy (Signed)
[place], [place], [address] Phone: [phone]   Fax:  [phone]  Physical Therapy Treatment  Patient Details  Name: [name] MRN: [id number] Date of Birth: 1955-12-19 Referring Provider:  Iline Oven, MD  Encounter Date: 08/18/2015      PT End of Session - 08/18/15 1511    Visit Number 2   Number of Visits 12   Date for PT Re-Evaluation 09/22/15      Past Medical History  Diagnosis Date  . TOBACCO ABUSE 11/25/2006  . CORONARY ARTERY DISEASE 11/25/2006    Cardiac cath by Dr Terrence Dupont 11/2003 LV showed good LV systolic function, EF of 69-62%. Left main was patent. LAD has 20-30% mid stenosis. Diagonal 1 and diagonal 2 were patent. Left circumflex was patent. OM1 was less than 0.5 mm which was diffusely diseased. OM2 has 20% ostial stenosis which was patent which  was moderate size. OM3 was patent at prior PTCA and stented site. RCA has 20-30% proximal   . Hypertension   . Atrial arrhythmia   . Type II diabetes mellitus   . Cellulitis of knee, right 06/06/12  . Chronic otitis externa 06/06/12    C Multiple trials with antibiocs and antifungal. Cultures were positive for Pseudomonas. Follow up with ENT on 06/14/12    . NSTEMI (non-ST elevated myocardial infarction) 08/14/2012    S/p Successful PTCA to proximal OM-3       Past Surgical History  Procedure Laterality Date  . Coronary angioplasty with stent placement  ~ 2005    "1"  . Cystectomy  1990's    LUA  . Left heart catheterization with coronary angiogram N/A 08/15/2012    Procedure: LEFT HEART CATHETERIZATION WITH CORONARY ANGIOGRAM;  Surgeon: Clent Demark, MD;  Location: [facility] CATH LAB;  Service: Cardiovascular;  Laterality: N/A;  . Percutaneous coronary stent intervention (pci-s)  08/15/2012    Procedure: PERCUTANEOUS CORONARY STENT INTERVENTION (PCI-S);  Surgeon: Clent Demark, MD;  Location: [facility] CATH LAB;  Service: Cardiovascular;;     There were no vitals filed for this visit.  Visit Diagnosis:  Low back pain radiating to both legs  Decreased range of motion of trunk and back  Hamstring tightness of both lower extremities      Subjective Assessment - 08/18/15 1425    Subjective 7/10  low back, no leg pain today.  Worse in am.. sometimes has leg pain in am.     Currently in Pain? Yes   Pain Score 7    Pain Location Back   Pain Radiating Towards legs    Aggravating Factors  first thing in am   Pain Relieving Factors nothing. Had a shot in [place]  might have helped some                         [facility] Adult PT Treatment/Exercise - 08/18/15 1428    Self-Care   Self-Care --  discussed correct techniques for painting.   Lumbar Exercises: Stretches   Active Hamstring Stretch 2 reps;30 seconds  supine passive   Single Knee to Chest Stretch 1 rep;30 seconds   Double Knee to Chest Stretch 1 rep;30 seconds   Traction   Type of Traction Lumbar   Min (lbs) 40   Max (lbs) 75   Hold Time 60   Rest Time 15  3 steps   Manual Therapy   Manual Therapy --  soft tissue work to  low back, quadratus, paraspinals, upper    Manual therapy comments tissue softened.                PT Education - 08/18/15 1511    Education provided Yes   Education Details ADL posture /painting   Person(s) Educated Patient   Methods Explanation;Demonstration;Handout   Comprehension Verbalized understanding          PT Short Term Goals - 08/18/15 1819    PT SHORT TERM GOAL #1   Title Pt. will be I with initial HEP   Time 2   Period Weeks   Status On-going           PT Long Term Goals - 08/11/15 1439    PT LONG TERM GOAL #1   Title Pt. 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  Progress toward home exercise goal.   PT Next Visit Plan assess traction, answer any body mechanics questions.  Stretch. hip clams and  other hip strengthening.   PT Home Exercise Plan Good posture withADL's, stretch   Consulted and Agree with Plan of Care Patient        Problem List Patient Active Problem List   Diagnosis Date Noted  . Pain in the chest   . Chest pain 04/10/2015  . Tooth infection 02/05/2015  . Right lumbar radiculitis 11/19/2014  . Insomnia 05/22/2014  . Rash and nonspecific skin eruption 05/22/2014  . Bilateral lower extremity edema 05/22/2014  . Healthcare maintenance 12/13/2013  . Greater trochanteric bursitis of left hip 05/16/2013  . Dysphagia, unspecified(787.20) 05/10/2013  . Lumbosacral spondylosis without myelopathy 04/18/2013  . Lumbar facet arthropathy 04/18/2013  . Sacroiliac joint dysfunction of left side 02/21/2013  . Leg length  discrepancy 02/21/2013  . Seborrhea capitis 01/23/2013  . NSTEMI (non-ST elevated myocardial infarction) 08/14/2012  . Tobacco abuse 08/08/2012  . Otitis externa 03/16/2012  .  Hypertension 09/25/2011  . GERD 04/08/2010  . Chronic radicular low back pain 07/20/2007  . Diabetes mellitus type 2, controlled 11/25/2006  . HYPOGONADISM 11/25/2006  . Coronary atherosclerosis 11/25/2006  . Atrial fibrillation 11/25/2006    HARRIS,KAREN 08/18/2015, 6:22 PM  [facility] [address] [place], [place], [address] Phone: [phone]   Fax:  [phone]     Melvenia Needles, PTA 08/18/2015 6:22 PM Phone: [phone] Fax: [phone]

## 2015-08-26 ENCOUNTER — Ambulatory Visit: Payer: 59 | Admitting: Physical Therapy

## 2015-08-26 DIAGNOSIS — M79604 Pain in right leg: Secondary | ICD-10-CM

## 2015-08-26 DIAGNOSIS — M545 Low back pain, unspecified: Secondary | ICD-10-CM

## 2015-08-26 DIAGNOSIS — M629 Disorder of muscle, unspecified: Secondary | ICD-10-CM

## 2015-08-26 DIAGNOSIS — M2560 Stiffness of unspecified joint, not elsewhere classified: Secondary | ICD-10-CM

## 2015-08-26 DIAGNOSIS — M2569 Stiffness of other specified joint, not elsewhere classified: Secondary | ICD-10-CM

## 2015-08-26 DIAGNOSIS — G8929 Other chronic pain: Secondary | ICD-10-CM

## 2015-08-26 NOTE — Therapy (Signed)
[place], [place], [address] Phone: [phone]   Fax:  [phone]  Physical Therapy Treatment  Patient Details  Name: [name] MRN: [id number] Date of Birth: 02-08-1956 Referring Provider:  Iline Oven, MD  Encounter Date: 08/26/2015      PT End of Session - 08/26/15 1727    Visit Number 3   Number of Visits 12   Date for PT Re-Evaluation 09/22/15   PT Start Time 1633   PT Stop Time 1733   PT Time Calculation (min) 60 min   Activity Tolerance Patient tolerated treatment well;No increased pain   Behavior During Therapy [facility] for tasks assessed/performed      Past Medical History  Diagnosis Date  . TOBACCO ABUSE 11/25/2006  . CORONARY ARTERY DISEASE 11/25/2006    Cardiac cath by Dr Terrence Dupont 11/2003 LV showed good LV systolic function, EF of 19-41%. Left main was patent. LAD has 20-30% mid stenosis. Diagonal 1 and diagonal 2 were patent. Left circumflex was patent. OM1 was less than 0.5 mm which was diffusely diseased. OM2 has 20% ostial stenosis which was patent which  was moderate size. OM3 was patent at prior PTCA and stented site. RCA has 20-30% proximal   . Hypertension   . Atrial arrhythmia   . Type II diabetes mellitus   . Cellulitis of knee, right 06/06/12  . Chronic otitis externa 06/06/12    C Multiple trials with antibiocs and antifungal. Cultures were positive for Pseudomonas. Follow up with ENT on 06/14/12    . NSTEMI (non-ST elevated myocardial infarction) 08/14/2012    S/p Successful PTCA to proximal OM-3       Past Surgical History  Procedure Laterality Date  . Coronary angioplasty with stent placement  ~ 2005    "1"  . Cystectomy  1990's    LUA  . Left heart catheterization with coronary angiogram N/A 08/15/2012    Procedure: LEFT HEART CATHETERIZATION WITH CORONARY ANGIOGRAM;  Surgeon: Clent Demark, MD;  Location: [facility] CATH LAB;  Service: Cardiovascular;  Laterality: N/A;  .  Percutaneous coronary stent intervention (pci-s)  08/15/2012    Procedure: PERCUTANEOUS CORONARY STENT INTERVENTION (PCI-S);  Surgeon: Clent Demark, MD;  Location: [facility] CATH LAB;  Service: Cardiovascular;;    There were no vitals filed for this visit.  Visit Diagnosis:  Low back pain radiating to both legs  Decreased range of motion of trunk and back  Hamstring tightness of both lower extremities  Low back pain of over 3 months duration      Subjective Assessment - 08/26/15 1645    Subjective 5/10 has leg pain RT to mid lateral leg.  Has woken with it la least last 3 days   Currently in Pain? Yes   Pain Score 5    Pain Location Back   Pain Orientation Right   Pain Descriptors / Indicators Burning;Cramping   Pain Radiating Towards RT leg   Pain Frequency Constant   Aggravating Factors  worse first thing in am.     Pain Relieving Factors shanging position in bed,  tries to walk it out                         [facility] Adult PT Treatment/Exercise - 08/26/15 1701    Lumbar Exercises: Stretches   Passive Hamstring Stretch 3 reps;30 seconds  with strap   Passive Hamstring Stretch Limitations 80 degrees both passively   Lower  Trunk Rotation 3 reps;20 seconds   Prone on Elbows Stretch Limitations prone on elbows res between prone exercises   Piriformis Stretch 3 reps;30 seconds  RT, Also Hip IR RT passive 3 reps 30 seconds.   Lumbar Exercises: Prone   Single Arm Raise 5 reps;5 seconds   Straight Leg Raise 5 reps;5 seconds   Opposite Arm/Leg Raise --  3 reps.  , 5-10 seconds, limited by fatigue   Other Prone Lumbar Exercises multifitus press 10 X 5 second holds.  2   Moist Heat Therapy   Number Minutes Moist Heat 15 Minutes   Moist Heat Location Lumbar Spine  and gluteals.  Requested to see if TENS would be helpful.     Engineer, agricultural [employer]   Electrical Stimulation  Parameters 100% 16   Electrical Stimulation Goals Pain                PT Education - 08/26/15 1726    Education provided Yes   Education Details walking program   Person(s) Educated Patient   Methods Explanation;Handout   Comprehension Verbalized understanding          PT Short Term Goals - 08/26/15 1741    PT SHORT TERM GOAL #1   Title Pt. will be I with initial HEP   Time 2   Period Weeks   Status Achieved           PT Long Term Goals - 08/26/15 1741    PT LONG TERM GOAL #1   Title Pt. will be I with advanced HEP   Time 6   Period Weeks   Status On-going   PT LONG TERM GOAL #2   Title Pt will report decreased pain with sit to stand/ transitional movements by 50% in order to improve ease of mobility   Baseline not yet consistantly changed   Time 6   Period Weeks   Status On-going   PT LONG TERM GOAL #3   Title Pt will report decreased pain/stiffness upon waking in the mornings (relief from exercises prior to getting up)by 50%   Baseline no change   Time 6   Period Weeks   Status On-going   PT LONG TERM GOAL #4   Title Pt will report improved ability to stand with ease and without back or leg pain (improved 50%) 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 will demo improved hamstring length by 10 degrees bilaterally to provide relief to low back   Baseline 80 degrees PROM   Time 6   Period Weeks   Status Partially Met               Plan - 08/26/15 1727    Clinical Impression Statement 80 degrees passive hamstrings.  Progress toward home exercise goal.   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   some gains with ROM.   PT Next Visit Plan add prone  back strengthening.  TENS OK with PT at patient's request, await order to return prior to issue.        Problem List Patient Active Problem List   Diagnosis Date Noted  . Pain in the chest   . Chest pain 04/10/2015  . Tooth infection 02/05/2015  . Right lumbar radiculitis 11/19/2014  . Insomnia 05/22/2014  . Rash and nonspecific skin eruption 05/22/2014  . Bilateral lower extremity edema 05/22/2014  . Healthcare maintenance 12/13/2013  . Greater trochanteric bursitis of left hip 05/16/2013  . Dysphagia, unspecified(787.20) 05/10/2013  . Lumbosacral spondylosis without myelopathy 04/18/2013  . Lumbar facet arthropathy 04/18/2013  . Sacroiliac joint dysfunction of left side 02/21/2013  . Leg length discrepancy 02/21/2013  . Seborrhea capitis 01/23/2013  . NSTEMI (non-ST elevated myocardial infarction) 08/14/2012  . Tobacco abuse 08/08/2012  . Otitis externa 03/16/2012  . Hypertension 09/25/2011  . GERD 04/08/2010  . Chronic radicular low back pain 07/20/2007  . Diabetes mellitus type 2, controlled 11/25/2006  . HYPOGONADISM 11/25/2006  . Coronary atherosclerosis 11/25/2006  . Atrial fibrillation 11/25/2006    HARRIS,KAREN 08/26/2015, 5:43 PM  [facility] [address] [place], [place], [address] Phone: [phone]   Fax:  [phone]     Melvenia Needles, PTA 08/26/2015 5:43 PM Phone: [phone] Fax: [phone]

## 2015-08-26 NOTE — Patient Instructions (Addendum)
WALKING  Walking is a great form of exercise to increase your strength, endurance and overall fitness.  A walking program can help you start slowly and gradually build endurance as you go.  Everyone's ability is different, so each person's starting point will be different.  You do not have to follow them exactly.  The are just samples. You should simply find out what's right for you and stick to that program.   In the beginning, you'll start off walking 2-3 times a day for short distances.  As you get stronger, you'll be walking further at just 1-2 times per day.  A. You Can Walk For A Certain Length Of Time Each Day    Walk 5 minutes 3 times per day.  Increase 2 minutes every 2 days (3 times per day).  Work up to 25-30 minutes (1-2 times per day).   Example:   Day 1-2 5 minutes 3 times per day   Day 7-8 12 minutes 2-3 times per day   Day 13-14 25 minutes 1-2 times per day  B. You Can Walk For a Certain Distance Each Day     Distance can be substituted for time.    Example:   3 trips to mailbox (at road)   3 trips to corner of block   3 trips around the block  C. Go to [facility]!!!!!!!!!!     Walk 2 laps about an hour.     3 to 4 X a week.   D. Walk   Please only do the exercises that your therapist has initialed and dated

## 2015-08-27 ENCOUNTER — Other Ambulatory Visit: Payer: Self-pay | Admitting: Internal Medicine

## 2015-09-02 ENCOUNTER — Ambulatory Visit: Payer: 59 | Admitting: Physical Therapy

## 2015-09-03 ENCOUNTER — Ambulatory Visit (INDEPENDENT_AMBULATORY_CARE_PROVIDER_SITE_OTHER): Payer: 59 | Admitting: Internal Medicine

## 2015-09-03 DIAGNOSIS — I10 Essential (primary) hypertension: Secondary | ICD-10-CM

## 2015-09-03 DIAGNOSIS — G8929 Other chronic pain: Secondary | ICD-10-CM

## 2015-09-03 DIAGNOSIS — M5416 Radiculopathy, lumbar region: Secondary | ICD-10-CM | POA: Diagnosis not present

## 2015-09-03 MED ORDER — DICLOFENAC SODIUM 1 % TD GEL: 2.0000 g | Freq: Four times a day (QID) | TRANSDERMAL | Status: DC

## 2015-09-03 MED ORDER — PREGABALIN 50 MG PO CAPS: 50.0000 mg | ORAL_CAPSULE | Freq: Three times a day (TID) | ORAL | Status: DC

## 2015-09-03 NOTE — Patient Instructions (Addendum)
[name] it was nice meeting you today.  -STOP taking Spironolactone and Lisinopril. Your blood pressure was low today.  -Continue taking Imdur.  -Take Lyrica 50 mg: 1 capsule by mouth 3 times daily for back pain.  -Continue using Voltaren gel.  -I have ordered a back MRI for you today. Our clinic will call you with the appointment date.   -Please return for a follow-up visit in 1 week so that we can re-check your blood pressure.   -Please stay hydrated. If you feel dizzy or lightheaded, call the clinic or seek medical attention immediately!

## 2015-09-04 ENCOUNTER — Telehealth: Payer: Self-pay | Admitting: Internal Medicine

## 2015-09-04 NOTE — Telephone Encounter (Signed)
Zigmund Daniel is working on prior authorization.

## 2015-09-04 NOTE — Telephone Encounter (Signed)
Please call pt back regarding his pain medicine that need prior authorization.

## 2015-09-05 NOTE — Progress Notes (Signed)
Patient ID: [name], male   DOB: [birth year], 59 y.o.   MRN: [id number]   Subjective:   Patient ID: [name] male   DOB: [birth year] 59 y.o.   MRN: [id number]  HPI: [name] is a 59 y.o. male with a past medical history of conditions listed below here today to discuss his chronic lower back pain. Please refer to the assessment and plan section for further details about the patient's chronic medical problems.    Past Medical History  Diagnosis Date  . TOBACCO ABUSE 11/25/2006  . CORONARY ARTERY DISEASE 11/25/2006    Cardiac cath by Dr Terrence Dupont 11/2003 LV showed good LV systolic function, EF of 65-53%. Left main was patent. LAD has 20-30% mid stenosis. Diagonal 1 and diagonal 2 were patent. Left circumflex was patent. OM1 was less than 0.5 mm which was diffusely diseased. OM2 has 20% ostial stenosis which was patent which  was moderate size. OM3 was patent at prior PTCA and stented site. RCA has 20-30% proximal   . Hypertension   . Atrial arrhythmia   . Type II diabetes mellitus   . Cellulitis of knee, right 06/06/12  . Chronic otitis externa 06/06/12    C Multiple trials with antibiocs and antifungal. Cultures were positive for Pseudomonas. Follow up with ENT on 06/14/12    . NSTEMI (non-ST elevated myocardial infarction) 08/14/2012    S/p Successful PTCA to proximal OM-3      Current Outpatient Prescriptions  Medication Sig Dispense Refill  . acetaminophen (TYLENOL) 500 MG tablet Take 1 tablet (500 mg total) by mouth every 8 (eight) hours as needed for fever. 30 tablet 0  . aspirin EC 81 MG tablet Take 1 tablet (81 mg total) by mouth daily. 90 tablet 3  . diclofenac sodium (VOLTAREN) 1 % GEL Apply 2 g topically 4 (four) times daily. to area of pain in the back. 1 Tube 1  . glipiZIDE (GLUCOTROL) 5 MG tablet Take 1 tablet (5 mg total) by mouth daily. 30 tablet 3  . HYDROcodone-acetaminophen (NORCO/VICODIN) 5-325 MG per tablet Take 1 tablet by mouth at bedtime as needed for moderate  pain or severe pain. 30 tablet 0  . isosorbide mononitrate (IMDUR) 30 MG 24 hr tablet TAKE ONE TABLET BY MOUTH ONCE DAILY 90 tablet 3  . metFORMIN (GLUCOPHAGE) 1000 MG tablet TAKE ONE TABLET BY MOUTH TWICE DAILY WITH MEALS 180 tablet 3  . NITROSTAT 0.4 MG SL tablet DISSOLVE ONE TABLET UNDER THE TONGUE EVERY 5 MINUTES AS NEEDED FOR CHEST PAIN.  DO NOT EXCEED A TOTAL OF 3 DOSES IN 15 MINUTES 30 tablet 0  . polyvinyl alcohol (LIQUIFILM TEARS) 1.4 % ophthalmic solution Place 1 drop into both eyes as needed for dry eyes. 15 mL 0  . pravastatin (PRAVACHOL) 40 MG tablet Take 1 tablet (40 mg total) by mouth daily. 90 tablet 3  . pregabalin (LYRICA) 50 MG capsule Take 1 capsule (50 mg total) by mouth 3 (three) times daily. 90 capsule 0  . selenium sulfide (SELSUN) 2.5 % shampoo Apply topically daily as needed for irritation. 118 mL 3  . sotalol (BETAPACE) 80 MG tablet TAKE ONE-HALF TABLET BY MOUTH TWICE DAILY (Patient taking differently: TAKE ONE TABLET BY MOUTH DAILY) 180 tablet 0  . triamcinolone lotion (KENALOG) 0.1 % Apply topically 2 (two) times daily. 60 mL 1  . XARELTO 20 MG TABS tablet Take 20 mg by mouth daily.      No current facility-administered medications for this  visit.   No family history on file. Social History   Social History  . Marital Status: Married    Spouse Name: N/A  . Number of Children: N/A  . Years of Education: N/A   Social History Main Topics  . Smoking status: Former Smoker -- 0.20 packs/day for 12 years    Types: Cigarettes    Quit date: 11/29/2014  . Smokeless tobacco: Never Used     Comment: quit about 22mths ago  . Alcohol Use: No  . Drug Use: No  . Sexual Activity: Yes   Other Topics Concern  . Not on file   Social History Narrative   Review of Systems: Review of Systems  Constitutional: Negative for fever and chills.  HENT: Negative for ear pain.   Eyes: Negative for double vision and pain.  Respiratory: Negative for cough and shortness of breath.    Cardiovascular: Negative for chest pain and leg swelling.  Gastrointestinal: Negative for nausea, vomiting, abdominal pain and diarrhea.  Genitourinary: Negative for dysuria, urgency and frequency.  Musculoskeletal: Positive for back pain. Negative for myalgias.  Skin: Negative for itching and rash.  Neurological: Negative for dizziness, sensory change, focal weakness and headaches.    Objective:  Physical Exam: There were no vitals filed for this visit. Physical Exam  Constitutional: He is oriented to person, place, and time. He appears well-developed and well-nourished.  HENT:  Head: Normocephalic and atraumatic.  Eyes: EOM are normal. Pupils are equal, round, and reactive to light.  Neck: Neck supple. No tracheal deviation present.  Cardiovascular: Normal rate, regular rhythm and intact distal pulses.   Pulmonary/Chest: Effort normal. No respiratory distress. He has no wheezes. He has no rales.  Lumbar paraspinal muscle tenderness (R>L). Strength and sensation grossly intact in bilateral upper and lower extremities.  Straight leg raise test negative bilaterally.   Abdominal: Soft. Bowel sounds are normal. He exhibits no distension. There is no tenderness.  Musculoskeletal: He exhibits no edema.  Neurological: He is alert and oriented to person, place, and time.  Skin: Skin is warm and dry.    Assessment & Plan:

## 2015-09-05 NOTE — Assessment & Plan Note (Addendum)
BP Readings from Last 3 Encounters:  07/25/15 148/76  07/10/15 113/67  04/17/15 128/73    Lab Results  Component Value Date   NA 141 04/09/2015   K 3.6 04/09/2015   CREATININE 0.89 04/09/2015    Assessment: BP soft (90/52) but patient is asymptomatic. He is not complaining of any lightheadedness, CP, or SOB.   Plan: -STOP Lisinopril 5 mg daily -STOP Spironolactone 25 mg daily  -Continue Imdur 30 mg daily  -F/u visit in 1 week

## 2015-09-05 NOTE — Telephone Encounter (Signed)
PA request submitted online via Cover My  Meds Request sent for review.  Left message with pt's family.Despina Hidden Cassady10/7/20164:58 PM        Sample question: Does the patient have a history of failure, contraindication, or intolerance to TWO of the following medications: Gabapentin (generic Neurontin), Duloxetine (generic Cymbalta), One (1) tricyclic antidepressant (for example, amitriptyline, clomipramine, desipramine, doxepin, imipramine, nortriptyline, protriptyline, trimipramine)? Per pt's EMR, he tried gabapentin 02/2013.

## 2015-09-05 NOTE — Assessment & Plan Note (Signed)
Patient has a history of chronic lower back pain. Imaging from 2014 showing: 1. Compression of the S1 nerve root sleeves in the lateral recesses at L5-S1, left worse than right due to hypertrophy of the facet joints and ligamenta flava and a small broad-based bulge of the disc. 2. Fairly severe left foraminal stenosis. 3. Severe left and moderately severe right facet arthritis with marked edema in the bones and soft tissues around the joints, much more prominent on the left than the right. In 06/2015 he received a left S1 selective nerve block and trans-foraminal epidermal steroid injection.  Patient reports his back pain has been radiating down his R leg for the past few days. States his pain is worse when walking and when lying down. Denies any numbness, tingling, or weakness in his legs. Denies "red flag" symptoms such as urinary or fecal incontinence. Patient states he went to PT in the past and it did not help him. States he is currently going to a Restaurant manager, fast food. -MRI of lumbar spine ordered today -Lyrica 50 mg TID -Continue Voltaren gel

## 2015-09-09 ENCOUNTER — Ambulatory Visit: Payer: 59 | Admitting: Physical Therapy

## 2015-09-11 NOTE — Progress Notes (Signed)
Internal Medicine Clinic Attending  I saw and evaluated the patient.  I personally confirmed the key portions of the history and exam documented by Dr. Rathore and I reviewed pertinent patient test results.  The assessment, diagnosis, and plan were formulated together and I agree with the documentation in the resident's note.  

## 2015-09-17 ENCOUNTER — Ambulatory Visit: Payer: 59 | Attending: Internal Medicine | Admitting: Physical Therapy

## 2015-09-17 DIAGNOSIS — M2560 Stiffness of unspecified joint, not elsewhere classified: Secondary | ICD-10-CM

## 2015-09-17 DIAGNOSIS — G8929 Other chronic pain: Secondary | ICD-10-CM | POA: Diagnosis present

## 2015-09-17 DIAGNOSIS — M629 Disorder of muscle, unspecified: Secondary | ICD-10-CM | POA: Diagnosis present

## 2015-09-17 DIAGNOSIS — M545 Low back pain, unspecified: Secondary | ICD-10-CM

## 2015-09-17 DIAGNOSIS — R29898 Other symptoms and signs involving the musculoskeletal system: Secondary | ICD-10-CM | POA: Insufficient documentation

## 2015-09-17 DIAGNOSIS — M79604 Pain in right leg: Secondary | ICD-10-CM

## 2015-09-17 DIAGNOSIS — M2569 Stiffness of other specified joint, not elsewhere classified: Secondary | ICD-10-CM

## 2015-09-17 NOTE — Therapy (Signed)
[place], [place], [address] Phone: [phone]   Fax:  [phone]  Physical Therapy Treatment/ReCertification  Patient Details  Name: [name] MRN: [id number] Date of Birth: [birth year] Referring Provider: Jones Bales, MD  Encounter Date: 09/17/2015      PT End of Session - 09/17/15 1553    Visit Number 4   Number of Visits 12   Date for PT Re-Evaluation 10/15/15   PT Start Time 1510   PT Stop Time 1553   PT Time Calculation (min) 43 min   Activity Tolerance Patient tolerated treatment well;No increased pain   Behavior During Therapy [facility] for tasks assessed/performed      Past Medical History  Diagnosis Date  . TOBACCO ABUSE 11/25/2006  . CORONARY ARTERY DISEASE 11/25/2006    Cardiac cath by Dr Terrence Dupont 11/2003 LV showed good LV systolic function, EF of 93-71%. Left main was patent. LAD has 20-30% mid stenosis. Diagonal 1 and diagonal 2 were patent. Left circumflex was patent. OM1 was less than 0.5 mm which was diffusely diseased. OM2 has 20% ostial stenosis which was patent which  was moderate size. OM3 was patent at prior PTCA and stented site. RCA has 20-30% proximal   . Hypertension   . Atrial arrhythmia   . Type II diabetes mellitus   . Cellulitis of knee, right 06/06/12  . Chronic otitis externa 06/06/12    C Multiple trials with antibiocs and antifungal. Cultures were positive for Pseudomonas. Follow up with ENT on 06/14/12    . NSTEMI (non-ST elevated myocardial infarction) 08/14/2012    S/p Successful PTCA to proximal OM-3       Past Surgical History  Procedure Laterality Date  . Coronary angioplasty with stent placement  ~ 2005    "1"  . Cystectomy  1990's    LUA  . Left heart catheterization with coronary angiogram N/A 08/15/2012    Procedure: LEFT HEART CATHETERIZATION WITH CORONARY ANGIOGRAM;  Surgeon: Clent Demark, MD;  Location: [facility] CATH LAB;  Service: Cardiovascular;   Laterality: N/A;  . Percutaneous coronary stent intervention (pci-s)  08/15/2012    Procedure: PERCUTANEOUS CORONARY STENT INTERVENTION (PCI-S);  Surgeon: Clent Demark, MD;  Location: [facility] CATH LAB;  Service: Cardiovascular;;    There were no vitals filed for this visit.  Visit Diagnosis:  Low back pain radiating to both legs - Plan: PT plan of care cert/re-cert  Decreased range of motion of trunk and back - Plan: PT plan of care cert/re-cert  Hamstring tightness of both lower extremities - Plan: PT plan of care cert/re-cert  Low back pain of over 3 months duration - Plan: PT plan of care cert/re-cert      Subjective Assessment - 09/17/15 1516    Subjective doesn't feel like he is getting any better; but has had limited attendance.  Also going to chiropractor for LBP   Patient Stated Goals pain relief   Currently in Pain? Yes   Pain Score 7    Pain Location Back   Pain Orientation Right   Pain Descriptors / Indicators Burning;Cramping   Pain Type Chronic pain   Pain Radiating Towards RLE to mid calf   Pain Onset More than a month ago   Pain Frequency Constant   Aggravating Factors  working/painting   Pain Relieving Factors repositioning            [facility] PT Assessment - 09/17/15 1552    Assessment   Referring  Provider Jones Bales, MD                     [facility] Adult PT Treatment/Exercise - 09/17/15 1517    Self-Care   Self-Care Other Self-Care Comments   Other Self-Care Comments  educated on home TENS unit, precautions, contraindications, benefits, application and use throughout the day.  Pt verbalized understanding.  Home TENS unit issued to pt.    Lumbar Exercises: Stretches   Passive Hamstring Stretch 2 reps;30 seconds   Passive Hamstring Stretch Limitations with strap   Single Knee to Chest Stretch 2 reps;20 seconds   Lumbar Exercises: Aerobic   Stationary Bike NuStep L 3 x 8 min                PT Education - 09/17/15 1553     Education provided Yes   Education Details home TENS unit   Person(s) Educated Patient   Methods Explanation;Demonstration;Handout;Other (comment)  issued unit   Comprehension Verbalized understanding;Returned demonstration          PT Short Term Goals - 08/26/15 1741    PT SHORT TERM GOAL #1   Title Pt. will be I with initial HEP   Time 2   Period Weeks   Status Achieved           PT Long Term Goals - 09/17/15 1541    PT LONG TERM GOAL #1   Title Pt. will be I with advanced HEP (10/15/15)   Time 4   Period Weeks   Status On-going   PT LONG TERM GOAL #2   Title Pt will report decreased pain with sit to stand/ transitional movements by 50% in order to improve ease of mobility (10/15/15)   Baseline pt reports 30% better   Time 4   Period Weeks   Status On-going   PT LONG TERM GOAL #3   Title Pt will report decreased pain/stiffness upon waking in the mornings (relief from exercises prior to getting up)by 50% (10/15/15)   Baseline pt reports 40% better   Time 4   Period Weeks   Status On-going   PT LONG TERM GOAL #4   Title Pt will report improved ability to stand with ease and without back or leg pain (improved 50%) 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 will demo improved hamstring length by 10 degrees bilaterally to provide relief to low back (10/15/15)   Baseline 80 degrees PROM   Time 4   Period Weeks   Status On-going               Plan - 09/17/15 1553    Clinical Impression Statement Issued home TENS unit for pt as script returned from MD.   Pt continues to demonstrate pain with activity and decreased functional mobility therefore will plan to extend POC x 4 more weeks.  Educated on consistent attendance for therapy to be beneficial.  Pt. verbalized understanding and agreeable to POC.   Pt will benefit from skilled therapeutic  intervention in order to improve on the following deficits Decreased range of motion;Pain;Improper body mechanics;Impaired flexibility;Decreased mobility;Decreased strength;Postural dysfunction   Rehab Potential Good   PT Frequency 1x / week   PT Duration 4 weeks   PT Treatment/Interventions Traction;Ultrasound;ADLs/Self Care Home Management;Passive range of motion;Patient/family education;Dry needling;Taping;Manual techniques;Therapeutic exercise;Moist Heat;Cryotherapy;Functional mobility training;Therapeutic activities;Neuromuscular re-education   PT Next Visit Plan add prone back strengthening.  TENS OK with PT at patient's request, await order to return prior to issue.   PT Home Exercise Plan Good posture withADL's, stretch   Consulted and Agree with Plan of Care Patient        Problem List Patient Active Problem List   Diagnosis Date Noted  . Pain in the chest   . Chest pain 04/10/2015  . Tooth infection 02/05/2015  . Right lumbar radiculitis 11/19/2014  . Insomnia 05/22/2014  . Rash and nonspecific skin eruption 05/22/2014  . Bilateral lower extremity edema 05/22/2014  . Healthcare maintenance 12/13/2013  . Greater trochanteric bursitis of left hip 05/16/2013  . Dysphagia, unspecified(787.20) 05/10/2013  . Lumbosacral spondylosis without myelopathy 04/18/2013  . Lumbar facet arthropathy 04/18/2013  . Sacroiliac joint dysfunction of left side 02/21/2013  . Leg length discrepancy 02/21/2013  . Seborrhea capitis 01/23/2013  . NSTEMI (non-ST elevated myocardial infarction) ([place]) 08/14/2012  . Tobacco abuse 08/08/2012  . Otitis externa 03/16/2012  . Hypertension 09/25/2011  . GERD 04/08/2010   . Chronic radicular low back pain 07/20/2007  . Diabetes mellitus type 2, controlled ([place]) 11/25/2006  . HYPOGONADISM 11/25/2006  . Coronary atherosclerosis 11/25/2006  . Atrial fibrillation ([place]) 11/25/2006   Laureen Abrahams, PT, DPT 09/17/2015 4:00 PM  [facility] [address] [place], [place], [address] Phone: [phone]   Fax:  [phone]  Name: ROSEMARY PENTECOST MRN: [id number] Date of Birth: 31-Oct-1956

## 2015-09-24 ENCOUNTER — Other Ambulatory Visit: Payer: Self-pay | Admitting: Internal Medicine

## 2015-09-24 DIAGNOSIS — L210 Seborrhea capitis: Secondary | ICD-10-CM

## 2015-09-25 ENCOUNTER — Ambulatory Visit (HOSPITAL_COMMUNITY)
Admission: RE | Admit: 2015-09-25 | Discharge: 2015-09-25 | Disposition: A | Discharge status: Home / Self Care | Payer: 59 | Source: Ambulatory Visit | Attending: Internal Medicine | Admitting: Internal Medicine

## 2015-09-25 ENCOUNTER — Encounter: Payer: 59 | Admitting: Physical Therapy

## 2015-09-25 DIAGNOSIS — M545 Low back pain: Secondary | ICD-10-CM | POA: Insufficient documentation

## 2015-09-25 DIAGNOSIS — M4807 Spinal stenosis, lumbosacral region: Secondary | ICD-10-CM | POA: Insufficient documentation

## 2015-09-25 DIAGNOSIS — G8929 Other chronic pain: Secondary | ICD-10-CM

## 2015-09-25 DIAGNOSIS — M4806 Spinal stenosis, lumbar region: Secondary | ICD-10-CM | POA: Insufficient documentation

## 2015-09-25 DIAGNOSIS — M5416 Radiculopathy, lumbar region: Secondary | ICD-10-CM

## 2015-09-26 ENCOUNTER — Ambulatory Visit (INDEPENDENT_AMBULATORY_CARE_PROVIDER_SITE_OTHER): Payer: 59 | Admitting: Internal Medicine

## 2015-09-26 ENCOUNTER — Encounter: Payer: Self-pay | Admitting: Internal Medicine

## 2015-09-26 VITALS — BP 139/75 | HR 72 | Temp 98.2°F | Wt 276.9 lb

## 2015-09-26 DIAGNOSIS — M5416 Radiculopathy, lumbar region: Secondary | ICD-10-CM | POA: Diagnosis not present

## 2015-09-26 DIAGNOSIS — G8929 Other chronic pain: Secondary | ICD-10-CM

## 2015-09-26 DIAGNOSIS — L853 Xerosis cutis: Secondary | ICD-10-CM | POA: Diagnosis not present

## 2015-09-26 DIAGNOSIS — L210 Seborrhea capitis: Secondary | ICD-10-CM

## 2015-09-26 MED ORDER — GABAPENTIN 100 MG PO CAPS: 100.0000 mg | ORAL_CAPSULE | Freq: Three times a day (TID) | ORAL | Status: DC

## 2015-09-26 MED ORDER — TRIAMCINOLONE ACETONIDE 0.1 % EX LOTN: TOPICAL_LOTION | Freq: Two times a day (BID) | CUTANEOUS | Status: DC

## 2015-09-26 MED ORDER — SELENIUM SULFIDE 2.5 % EX LOTN: TOPICAL_LOTION | CUTANEOUS | Status: DC

## 2015-09-26 NOTE — Progress Notes (Signed)
Medicine attending: I personally interviewed and briefly examined this patient, and reviewed pertinent clinical laboratory  data  with resident physician Dr.Carly Rivet and we discussed a   management plan. 

## 2015-09-26 NOTE — Assessment & Plan Note (Signed)
-   Change to Gabapentin 100 mg TID - Continue voltaren gel and Aleve as needed - Patient reports he would rather see chiropractor than PT as he feels the chiropractor is more thorough in his care. Will cancel PT referral.

## 2015-09-26 NOTE — Assessment & Plan Note (Signed)
-   Recommended to try OTC Cortisone 1% cream

## 2015-09-26 NOTE — Assessment & Plan Note (Signed)
-   Refilled his Selenium shampoo and Kenalog cream

## 2015-09-26 NOTE — Progress Notes (Signed)
   Subjective:    Patient ID: [name], male    DOB: 12/02/1955, 59 y.o.   MRN: [id number]  HPI [name] is a 59yo man with PMHx of HTN, CAD, Afib, and type 2 DM who presents today for follow up of his chronic low back pain.  He reports 7/10 achy pain today with sharp pains going down his right leg. He notes the pain is the worst while he is working. He works as a Curator. He was prescribed Lyrica at his last visit but was unable to pick this up as his insurance did not cover it and would cost him $500. He has been using voltaren gel and Aleve with minimal relief. He had a repeat MRI lumbar spine done on 10/27 which showed progressive mild-moderate right foraminal stenosis at L5-S1 and stable moderate-severe left foraminal stenosis at L5-S1.   He also reports having dry skin on his face involving the cheeks, chin, and lips. He states this started about 2 weeks ago. He describes the skin as itchy and painful. He has tried vaseline on his face with no relief.    Review of Systems General: Denies fever, chills, night sweats, changes in weight, changes in appetite HEENT: Denies headaches, ear pain, changes in vision, rhinorrhea, sore throat CV: Denies CP, palpitations, SOB, orthopnea Pulm: Denies SOB, cough, wheezing GI: Denies abdominal pain, nausea, vomiting, diarrhea, constipation, melena, hematochezia GU: Denies dysuria, hematuria, frequency Msk: Denies muscle cramps Neuro: Denies numbness, tingling Skin: Denies bruising Psych: Denies depression, anxiety, hallucinations    Objective:   Physical Exam General: alert, sitting up, NAD HEENT: [place]/AT, EOMI, sclera anicteric, mucus membranes moist CV: RRR, no m/g/r Pulm: CTA bilaterally, breaths non-labored Ext: warm, no peripheral edema. Able to lift lower extremities about 30 degrees off of bed before pain is elicited in his lower back.  Neuro: alert and oriented x 3. Strength 5/5 in upper extremities and 4+/5 in lower extremities  secondary to pain.  Skin: Dry skin is present on his bilateral cheeks, chin, around his mouth, and lips. Skin appears inflamed.     Assessment & Plan:  Please refer to A&P documentation.

## 2015-09-26 NOTE — Patient Instructions (Signed)
-   Start Gabapentin 100 mg three times daily - Try using Cortisone 1% cream on your face  - Refills placed for your shampoo and kenalog cream  General Instructions:   Please bring your medicines with you each time you come to clinic.  Medicines may include prescription medications, over-the-counter medications, herbal remedies, eye drops, vitamins, or other pills.   Progress Toward Treatment Goals:  Treatment Goal 04/17/2015  Hemoglobin A1C at goal  Blood pressure -    Self Care Goals & Plans:  Self Care Goal 07/10/2015  Manage my medications take my medicines as prescribed; bring my medications to every visit; refill my medications on time; follow the sick day instructions if I am sick  Monitor my health keep track of my weight; check my feet daily  Eat healthy foods eat more vegetables; eat fruit for snacks and desserts; eat baked foods instead of fried foods; drink diet soda or water instead of juice or soda  Be physically active find an activity I enjoy  Other -    Home Blood Glucose Monitoring 04/17/2015  Check my blood sugar no home glucose monitoring  When to check my blood sugar N/A     Care Management & Community Referrals:  Referral 04/17/2015  Referrals made for care management support none needed

## 2015-09-30 ENCOUNTER — Ambulatory Visit: Payer: 59 | Attending: Internal Medicine | Admitting: Physical Therapy

## 2015-09-30 DIAGNOSIS — R29898 Other symptoms and signs involving the musculoskeletal system: Secondary | ICD-10-CM | POA: Insufficient documentation

## 2015-09-30 DIAGNOSIS — M2560 Stiffness of unspecified joint, not elsewhere classified: Secondary | ICD-10-CM

## 2015-09-30 DIAGNOSIS — M545 Low back pain, unspecified: Secondary | ICD-10-CM

## 2015-09-30 DIAGNOSIS — M629 Disorder of muscle, unspecified: Secondary | ICD-10-CM | POA: Insufficient documentation

## 2015-09-30 DIAGNOSIS — M2569 Stiffness of other specified joint, not elsewhere classified: Secondary | ICD-10-CM

## 2015-09-30 DIAGNOSIS — G8929 Other chronic pain: Secondary | ICD-10-CM | POA: Insufficient documentation

## 2015-09-30 NOTE — Therapy (Signed)
[place], [place], [address] Phone: [phone]   Fax:  [phone]  Physical Therapy Treatment  Patient Details  Name: [name] MRN: [id number] Date of Birth: 1956-07-05 Referring Provider: Jones Bales, MD  Encounter Date: 09/30/2015      PT End of Session - 09/30/15 1520    Visit Number 5   Number of Visits 12   PT Start Time 1093   PT Stop Time 2355   PT Time Calculation (min) 59 min   Activity Tolerance Patient tolerated treatment well;No increased pain   Behavior During Therapy [facility] for tasks assessed/performed      Past Medical History  Diagnosis Date  . TOBACCO ABUSE 11/25/2006  . CORONARY ARTERY DISEASE 11/25/2006    Cardiac cath by Dr Terrence Dupont 11/2003 LV showed good LV systolic function, EF of 73-22%. Left main was patent. LAD has 20-30% mid stenosis. Diagonal 1 and diagonal 2 were patent. Left circumflex was patent. OM1 was less than 0.5 mm which was diffusely diseased. OM2 has 20% ostial stenosis which was patent which  was moderate size. OM3 was patent at prior PTCA and stented site. RCA has 20-30% proximal   . Hypertension   . Atrial arrhythmia   . Type II diabetes mellitus ([place])   . Cellulitis of knee, right 06/06/12  . Chronic otitis externa 06/06/12    C Multiple trials with antibiocs and antifungal. Cultures were positive for Pseudomonas. Follow up with ENT on 06/14/12    . NSTEMI (non-ST elevated myocardial infarction) ([place]) 08/14/2012    S/p Successful PTCA to proximal OM-3       Past Surgical History  Procedure Laterality Date  . Coronary angioplasty with stent placement  ~ 2005    "1"  . Cystectomy  1990's    LUA  . Left heart catheterization with coronary angiogram N/A 08/15/2012    Procedure: LEFT HEART CATHETERIZATION WITH CORONARY ANGIOGRAM;  Surgeon: Clent Demark, MD;  Location: [facility] CATH LAB;  Service: Cardiovascular;  Laterality: N/A;  . Percutaneous coronary stent  intervention (pci-s)  08/15/2012    Procedure: PERCUTANEOUS CORONARY STENT INTERVENTION (PCI-S);  Surgeon: Clent Demark, MD;  Location: [facility] CATH LAB;  Service: Cardiovascular;;    There were no vitals filed for this visit.  Visit Diagnosis:  Low back pain radiating to both legs  Decreased range of motion of trunk and back  Hamstring tightness of both lower extremities  Low back pain of over 3 months duration      Subjective Assessment - 09/30/15 1421    Subjective HAS MRI last week.  Uses TENS  It helps some.,  MRI looks worse.  5/10 now.     Currently in Pain? Yes   Pain Score 5    Pain Location Back   Pain Orientation Right   Pain Descriptors / Indicators --  hurts steady   Pain Radiating Towards RT    Aggravating Factors  working   Pain Relieving Factors TENS a ittle                         [facility] Adult PT Treatment/Exercise - 09/30/15 1435    Self-Care   Other Self-Care Comments  heat self care handout.   Lumbar Exercises: Stretches   Passive Hamstring Stretch 3 reps;30 seconds   Passive Hamstring Stretch Limitations 86 RT 56 LT AROM   Single Knee to Chest Stretch 2 reps   Lumbar  Exercises: Standing   Heel Raises 10 reps  single leg each   Wall Slides --  9 X 10 seconds after instruction.   Lumbar Exercises: Supine   Bridge 10 reps  counts hold to 20   Lumbar Exercises: Prone   Other Prone Lumbar Exercises Multifitus series.  press, knee flexion, SLR. upper body lift added to home exercise   Moist Heat Therapy   Number Minutes Moist Heat 15 Minutes   Moist Heat Location Lumbar Spine                PT Education - 09/30/15 1443    Education provided Yes   Education Details Prone multifitus series, heat   Person(s) Educated Patient   Methods Explanation;Demonstration;Verbal cues;Handout   Comprehension Verbalized understanding;Returned demonstration          PT Short Term Goals - 08/26/15 1741    PT SHORT TERM GOAL #1   Title  Pt. will be I with initial HEP   Time 2   Period Weeks   Status Achieved           PT Long Term Goals - 09/30/15 1748    PT LONG TERM GOAL #1   Title Pt. will be I with advanced HEP (10/15/15)   Time 4   Period Weeks   Status Achieved   PT LONG TERM GOAL #2   Title Pt will report decreased pain with sit to stand/ transitional movements by 50% in order to improve ease of mobility (10/15/15)   Baseline pt reports 30% better(1 week ago)  Today: no better   Time 4   Period Weeks   Status On-going   PT LONG TERM GOAL #3   Title Pt will report decreased pain/stiffness upon waking in the mornings (relief from exercises prior to getting up)by 50% (10/15/15)   Baseline pt reports 40% better(1 week ago)  Tday reports no better   Time 4   Period Weeks   Status On-going   PT LONG TERM GOAL #4   Title Pt will report improved ability to stand with ease and without back or leg pain (improved 50%) 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 will demo improved hamstring length by 10 degrees bilaterally to provide relief to low back (10/15/15)   Baseline 80 degrees PROM  RT    Time 4   Period Weeks   Status Partially Met               Plan - 09/30/15 1520    Clinical Impression Statement Little progress toward goals, even the ones he had given precentages of improvement .  MRI Worse.          Problem List Patient Active Problem List   Diagnosis Date Noted  . Dry skin dermatitis 09/26/2015  . Pain in the chest   . Chest pain 04/10/2015  . Tooth infection  02/05/2015  . Right lumbar radiculitis 11/19/2014  . Insomnia 05/22/2014  . Rash and  nonspecific skin eruption 05/22/2014  . Bilateral lower extremity edema 05/22/2014  . Healthcare maintenance 12/13/2013  . Greater trochanteric bursitis of left hip 05/16/2013  . Dysphagia, unspecified(787.20) 05/10/2013  . Lumbosacral spondylosis without myelopathy 04/18/2013  . Lumbar facet arthropathy 04/18/2013  . Sacroiliac joint dysfunction of left side 02/21/2013  . Leg length discrepancy 02/21/2013  . Seborrhea capitis 01/23/2013  . NSTEMI (non-ST elevated myocardial infarction) ([place]) 08/14/2012  . Tobacco abuse 08/08/2012  . Otitis externa 03/16/2012  . Hypertension 09/25/2011  . GERD 04/08/2010  . Chronic radicular low back pain 07/20/2007  . Diabetes mellitus type 2, controlled ([place]) 11/25/2006  . HYPOGONADISM 11/25/2006  . Coronary atherosclerosis 11/25/2006  . Atrial fibrillation ([place]) 11/25/2006    Tremon Sainvil 09/30/2015, 5:52 PM  [facility] [address] [place], [place], [address] Phone: [phone]   Fax:  [phone]  Name: [name] MRN: [id number] Date of Birth: 1956-04-30    Melvenia Needles, PTA 09/30/2015 5:52 PM Phone: [phone] Fax: [phone]

## 2015-09-30 NOTE — Patient Instructions (Signed)
Heat Therapy  Heat therapy can help ease sore, stiff, injured, and tight muscles and joints. Heat relaxes your muscles, which may help ease your pain.   RISKS AND COMPLICATIONS  If you have any of the following conditions, do not use heat therapy unless your health care provider has approved:   Poor circulation.   Healing wounds or scarred skin in the area being treated.   Diabetes, heart disease, or high blood pressure.   Not being able to feel (numbness) the area being treated.   Unusual swelling of the area being treated.   Active infections.   Blood clots.   Cancer.   Inability to communicate pain. This may include young children and people who have problems with their brain function (dementia).   Pregnancy.  Heat therapy should only be used on old, pre-existing, or long-lasting (chronic) injuries. Do not use heat therapy on new injuries unless directed by your health care provider.  HOW TO USE HEAT THERAPY  There are several different kinds of heat therapy, including:   Moist heat pack.   Warm water bath.   Hot water bottle.   Electric heating pad.   Heated gel pack.   Heated wrap.   Electric heating pad.  Use the heat therapy method suggested by your health care provider. Follow your health care provider's instructions on when and how to use heat therapy.  GENERAL HEAT THERAPY RECOMMENDATIONS   Do not sleep while using heat therapy. Only use heat therapy while you are awake.   Your skin may turn pink while using heat therapy. Do not use heat therapy if your skin turns red.   Do not use heat therapy if you have new pain.   High heat or long exposure to heat can cause burns. Be careful when using heat therapy to avoid burning your skin.   Do not use heat therapy on areas of your skin that are already irritated, such as with a rash or sunburn.  SEEK MEDICAL CARE IF:   You have blisters, redness, swelling, or numbness.   You have new pain.   Your pain is worse.  MAKE SURE  YOU:   Understand these instructions.   Will watch your condition.   Will get help right away if you are not doing well or get worse.     This information is not intended to replace advice given to you by your health care provider. Make sure you discuss any questions you have with your health care provider.     Document Released: 02/07/2012 Document Revised: 12/06/2014 Document Reviewed: 01/08/2014  [employer]vier Interactive Patient Education 2016 [employer]

## 2015-10-07 ENCOUNTER — Ambulatory Visit: Payer: 59 | Admitting: Physical Therapy

## 2015-10-07 DIAGNOSIS — M2560 Stiffness of unspecified joint, not elsewhere classified: Secondary | ICD-10-CM

## 2015-10-07 DIAGNOSIS — G8929 Other chronic pain: Secondary | ICD-10-CM

## 2015-10-07 DIAGNOSIS — M545 Low back pain, unspecified: Secondary | ICD-10-CM

## 2015-10-07 DIAGNOSIS — M2569 Stiffness of other specified joint, not elsewhere classified: Secondary | ICD-10-CM

## 2015-10-07 NOTE — Therapy (Signed)
[place], [place], [address] Phone: [phone]   Fax:  [phone]  Physical Therapy Treatment  Patient Details  Name: [name] MRN: [id number] Date of Birth: [birth year] Referring Provider: Jones Bales, MD  Encounter Date: 10/07/2015      PT End of Session - 10/07/15 1627    Visit Number 6   Number of Visits 12   Date for PT Re-Evaluation 10/15/15   PT Start Time 1520   PT Stop Time 1620   PT Time Calculation (min) 60 min   Activity Tolerance Patient tolerated treatment well;No increased pain   Behavior During Therapy [facility] for tasks assessed/performed      Past Medical History  Diagnosis Date  . TOBACCO ABUSE 11/25/2006  . CORONARY ARTERY DISEASE 11/25/2006    Cardiac cath by Dr Terrence Dupont 11/2003 LV showed good LV systolic function, EF of 43-32%. Left main was patent. LAD has 20-30% mid stenosis. Diagonal 1 and diagonal 2 were patent. Left circumflex was patent. OM1 was less than 0.5 mm which was diffusely diseased. OM2 has 20% ostial stenosis which was patent which  was moderate size. OM3 was patent at prior PTCA and stented site. RCA has 20-30% proximal   . Hypertension   . Atrial arrhythmia   . Type II diabetes mellitus ([place])   . Cellulitis of knee, right 06/06/12  . Chronic otitis externa 06/06/12    C Multiple trials with antibiocs and antifungal. Cultures were positive for Pseudomonas. Follow up with ENT on 06/14/12    . NSTEMI (non-ST elevated myocardial infarction) ([place]) 08/14/2012    S/p Successful PTCA to proximal OM-3       Past Surgical History  Procedure Laterality Date  . Coronary angioplasty with stent placement  ~ 2005    "1"  . Cystectomy  1990's    LUA  . Left heart catheterization with coronary angiogram N/A 08/15/2012    Procedure: LEFT HEART CATHETERIZATION WITH CORONARY ANGIOGRAM;  Surgeon: Clent Demark, MD;  Location: [facility] CATH LAB;  Service: Cardiovascular;  Laterality:  N/A;  . Percutaneous coronary stent intervention (pci-s)  08/15/2012    Procedure: PERCUTANEOUS CORONARY STENT INTERVENTION (PCI-S);  Surgeon: Clent Demark, MD;  Location: [facility] CATH LAB;  Service: Cardiovascular;;    There were no vitals filed for this visit.  Visit Diagnosis:  Low back pain radiating to both legs  Decreased range of motion of trunk and back  Low back pain of over 3 months duration      Subjective Assessment - 10/07/15 1534    Subjective 6/10 pain now with leg pain.  Can I try traction today?  He weighs 240 LBS.   Currently in Pain? Yes   Pain Score 6    Pain Location Back   Pain Orientation Right   Pain Descriptors / Indicators --  pain   Pain Type Chronic pain   Pain Radiating Towards RT leg   Pain Frequency Constant   Aggravating Factors  , getting up 1st thing in am.  Worse after work.   Pain Relieving Factors TENS a alittle                         [facility] Adult PT Treatment/Exercise - 10/07/15 1537    Ultrasound   Ultrasound Location Lumbar   Ultrasound Parameters 100% 1.5 watts/cm2 X 8 minutes   Ultrasound Goals Pain   Traction   Type of Traction Lumbar  Min (lbs) 50   Max (lbs) 110   Hold Time 60   Rest Time 15   Time 17   Manual Therapy   Manual therapy comments soft tissue work with instrument assist ROCK blade and Bio freeze.  to low back tissue stiff initially  then softened                   PT Short Term Goals - 10/07/15 1630    PT SHORT TERM GOAL #1   Title Pt. will be I with initial HEP   Status Achieved           PT Long Term Goals - 10/07/15 1630    PT LONG TERM GOAL #1   Title Pt. will be I with advanced HEP (10/15/15)   Time 4   Period Weeks   Status On-going   PT LONG TERM GOAL #2   Title Pt will report decreased pain with sit to stand/ transitional movements by 50% in order to improve ease of mobility (10/15/15)   Time 4   Period Weeks   Status Unable to assess   PT LONG TERM GOAL #3    Title Pt will report decreased pain/stiffness upon waking in the mornings (relief from exercises prior to getting up)by 50% (10/15/15)   Time 4   Period Weeks   Status On-going   PT LONG TERM GOAL #4   Title Pt will report improved ability to stand with ease and without back or leg pain (improved 50%) 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  pain decreased intensity into leg and low back post session.     PT Next Visit Plan 1 more visit  POC out,         Problem List Patient Active Problem List   Diagnosis Date Noted  . Dry skin dermatitis 09/26/2015  . Pain in the chest   . Chest pain 04/10/2015  . Tooth infection 02/05/2015  . Right lumbar radiculitis 11/19/2014  . Insomnia 05/22/2014  . Rash and nonspecific skin eruption 05/22/2014  . Bilateral lower extremity edema 05/22/2014  . Healthcare maintenance 12/13/2013  . Greater trochanteric bursitis of left hip 05/16/2013  . Dysphagia, unspecified(787.20) 05/10/2013  . Lumbosacral spondylosis without myelopathy 04/18/2013  . Lumbar facet arthropathy 04/18/2013  . Sacroiliac joint dysfunction of left side 02/21/2013  . Leg length discrepancy 02/21/2013  . Seborrhea capitis 01/23/2013  . NSTEMI (non-ST elevated myocardial infarction) ([place]) 08/14/2012  . Tobacco abuse 08/08/2012  . Otitis externa 03/16/2012  . Hypertension 09/25/2011  . GERD 04/08/2010  . Chronic radicular low back pain 07/20/2007  . Diabetes mellitus type 2, controlled ([place]) 11/25/2006  . HYPOGONADISM 11/25/2006  . Coronary atherosclerosis 11/25/2006  . Atrial fibrillation ([place]) 11/25/2006    Nesiah Jump 10/07/2015, 4:32 PM  [facility] [address] [place], [place], [address] Phone: [phone]   Fax:  [phone]  Name: [name] MRN: [id number] Date of Birth: Nov 13, 1956    Melvenia [place]les, PTA 10/07/2015 4:32 PM Phone: [phone] Fax: [phone]

## 2015-10-16 ENCOUNTER — Ambulatory Visit: Payer: 59 | Admitting: Physical Therapy

## 2015-10-16 DIAGNOSIS — G8929 Other chronic pain: Secondary | ICD-10-CM

## 2015-10-16 DIAGNOSIS — M2569 Stiffness of other specified joint, not elsewhere classified: Secondary | ICD-10-CM

## 2015-10-16 DIAGNOSIS — M545 Low back pain, unspecified: Secondary | ICD-10-CM

## 2015-10-16 DIAGNOSIS — M629 Disorder of muscle, unspecified: Secondary | ICD-10-CM

## 2015-10-16 DIAGNOSIS — M2560 Stiffness of unspecified joint, not elsewhere classified: Secondary | ICD-10-CM

## 2015-10-16 NOTE — Therapy (Signed)
[place], [place], [address] Phone: [phone]   Fax:  [phone]  Physical Therapy Treatment/Renewal  Patient Details  Name: [name] MRN: [id number] Date of Birth: 03-Dec-1955 Referring Provider: Dr. Gordy Levan   Encounter Date: 10/16/2015      PT End of Session - 10/16/15 1548    Visit Number 7   Number of Visits 15   Date for PT Re-Evaluation 11/20/15   PT Start Time 1031   PT Stop Time 1540   PT Time Calculation (min) 55 min   Activity Tolerance Patient tolerated treatment well   Behavior During Therapy [facility] for tasks assessed/performed      Past Medical History  Diagnosis Date  . TOBACCO ABUSE 11/25/2006  . CORONARY ARTERY DISEASE 11/25/2006    Cardiac cath by Dr Terrence Dupont 11/2003 LV showed good LV systolic function, EF of 59-45%. Left main was patent. LAD has 20-30% mid stenosis. Diagonal 1 and diagonal 2 were patent. Left circumflex was patent. OM1 was less than 0.5 mm which was diffusely diseased. OM2 has 20% ostial stenosis which was patent which  was moderate size. OM3 was patent at prior PTCA and stented site. RCA has 20-30% proximal   . Hypertension   . Atrial arrhythmia   . Type II diabetes mellitus ([place])   . Cellulitis of knee, right 06/06/12  . Chronic otitis externa 06/06/12    C Multiple trials with antibiocs and antifungal. Cultures were positive for Pseudomonas. Follow up with ENT on 06/14/12    . NSTEMI (non-ST elevated myocardial infarction) ([place]) 08/14/2012    S/p Successful PTCA to proximal OM-3       Past Surgical History  Procedure Laterality Date  . Coronary angioplasty with stent placement  ~ 2005    "1"  . Cystectomy  1990's    LUA  . Left heart catheterization with coronary angiogram N/A 08/15/2012    Procedure: LEFT HEART CATHETERIZATION WITH CORONARY ANGIOGRAM;  Surgeon: Clent Demark, MD;  Location: [facility] CATH LAB;  Service: Cardiovascular;  Laterality: N/A;  .  Percutaneous coronary stent intervention (pci-s)  08/15/2012    Procedure: PERCUTANEOUS CORONARY STENT INTERVENTION (PCI-S);  Surgeon: Clent Demark, MD;  Location: [facility] CATH LAB;  Service: Cardiovascular;;    There were no vitals filed for this visit.  Visit Diagnosis:  Low back pain radiating to both legs  Decreased range of motion of trunk and back  Low back pain of over 3 months duration  Hamstring tightness of both lower extremities      Subjective Assessment - 10/16/15 1503    Subjective 4/10 today in my back and Rt. lateral thigh.  Willing to exercise.  Has many questions about his condition and MRI.    Currently in Pain? Yes   Pain Score 4    Pain Location Back   Pain Orientation Right   Pain Descriptors / Indicators Aching;Sore   Pain Type Chronic pain   Pain Radiating Towards Rt. thigh antlat.    Pain Onset More than a month ago   Pain Frequency Constant   Aggravating Factors  excessive acitvity, bending, squatting   Pain Relieving Factors stretches, rest   Multiple Pain Sites No            [facility] PT Assessment - 10/16/15 1527    Assessment   Medical Diagnosis Chronic radicular low back pain   Referring Provider Dr. Gordy Levan    AROM   Lumbar Flexion 70  Lumbar Extension 25   Lumbar - Right Side Bend 25   Lumbar - Left Side Bend 30   Lumbar - Right Rotation WNL pain on Rt.    Lumbar - Left Rotation WNL pain on Rt.    Strength   Right Hip Flexion 4+/5   Right Hip Extension 3+/5   Right Hip ABduction 4+/5   Left Hip Flexion 4+/5   Left Hip Extension 3+/5   Left Hip ABduction 4/5   Right/Left Knee --  WNL   Right/Left Ankle --  WNL   Flexibility   Hamstrings tight but >75 deg with sl knee flex   Palpation   Spinal mobility pain L3-L4-L5-S1 P/A pressure    Palpation comment med sized knot L lumbar                     [facility] Adult PT Treatment/Exercise - 10/16/15 1517    Lumbar Exercises: Stretches   Active Hamstring Stretch 2 reps;30  seconds   Single Knee to Chest Stretch 3 reps;30 seconds   Lower Trunk Rotation 5 reps   Piriformis Stretch 3 reps;30 seconds                PT Education - 10/16/15 1547    Education provided Yes   Education Details anatomy, MRI report (written), stenosis and exercises for trunk flexibility   Person(s) Educated Patient   Methods Explanation   Comprehension Verbalized understanding;Returned demonstration          PT Short Term Goals - 10/07/15 1630    PT SHORT TERM GOAL #1   Title Pt. will be I with initial HEP   Status Achieved           PT Long Term Goals - 10/16/15 1612    PT LONG TERM GOAL #1   Title Pt. will be I with advanced HEP (10/15/15)   Status On-going   PT LONG TERM GOAL #2   Title Pt will report decreased pain with sit to stand/ transitional movements by 50% in order to improve ease of mobility (10/15/15)   Baseline varies   Status Partially Met   PT LONG TERM GOAL #3   Title Pt will report decreased pain/stiffness upon waking in the mornings (relief from exercises prior to getting up)by 50% (10/15/15)   PT LONG TERM GOAL #4   Status On-going   PT LONG TERM GOAL #5   Title Pt will verbalize and demonstrate improved posture during painting activities in order to alleviate risk of future back pain/injury   Status Achieved   PT LONG TERM GOAL #6   Title Pt. will demo improved hamstring length by 10 degrees bilaterally to provide relief to low back (10/15/15)   Status Partially Met               Plan - 10/16/15 1616    Clinical Impression Statement Pt would like to finish initial number of visits.  Seems to be a ittle better functionally but does have potential to learn more about body mech and develop a good HEP, understand his condition better.     Pt will benefit from skilled therapeutic intervention in order to improve on the following deficits Decreased range of motion;Pain;Improper body mechanics;Impaired flexibility;Decreased  mobility;Decreased strength;Postural dysfunction   Rehab Potential Good   PT Frequency 2x / week   PT Duration 6 weeks   PT Next Visit Plan stab/strength and progress as tolerated   PT Home Exercise Plan I with   stretching   Consulted and Agree with Plan of Care Patient          G-Codes - 10/16/15 1614    Functional Assessment Tool Used clinical judgement   Functional Limitation Mobility: Walking and moving around   Mobility: Walking and Moving Around Current Status (G8978) At least 40 percent but less than 60 percent impaired, limited or restricted   Mobility: Walking and Moving Around Goal Status (G8979) At least 20 percent but less than 40 percent impaired, limited or restricted      Problem List Patient Active Problem List   Diagnosis Date Noted  . Dry skin dermatitis 09/26/2015  . Pain in the chest   . Chest pain 04/10/2015  . Tooth infection 02/05/2015  . Right lumbar radiculitis 11/19/2014  . Insomnia 05/22/2014  . Rash and nonspecific skin eruption 05/22/2014  . Bilateral lower extremity edema 05/22/2014  . Healthcare maintenance 12/13/2013  . Greater trochanteric bursitis of left hip 05/16/2013  . Dysphagia, unspecified(787.20) 05/10/2013  . Lumbosacral spondylosis without myelopathy 04/18/2013  . Lumbar facet arthropathy 04/18/2013  . Sacroiliac joint dysfunction of left side 02/21/2013  . Leg length discrepancy 02/21/2013  . Seborrhea capitis 01/23/2013  . NSTEMI (non-ST elevated myocardial infarction) ([facility]) 08/14/2012  . Tobacco abuse 08/08/2012  . Otitis externa 03/16/2012  . Hypertension 09/25/2011  . GERD 04/08/2010  . Chronic radicular low back pain 07/20/2007  . Diabetes mellitus type 2, controlled ([facility]) 11/25/2006  . HYPOGONADISM 11/25/2006  . Coronary atherosclerosis 11/25/2006  . Atrial fibrillation ([facility]) 11/25/2006    PAA,JENNIFER 10/16/2015, 4:37 PM  [facility] [address] [place], [place], [address] Phone: [phone]   Fax:  [phone]  Name: [name] MRN: [id number] Date of Birth: 07/01/1956   Jennifer Paa, PT 10/16/2015 4:38 PM Phone: [phone] Fax: [phone]   

## 2015-10-16 NOTE — Patient Instructions (Signed)
Stretching at work, standing "sink" stretch and sidelying QL at home. No handouts given

## 2015-10-24 ENCOUNTER — Other Ambulatory Visit: Payer: Self-pay | Admitting: Internal Medicine

## 2015-10-28 ENCOUNTER — Ambulatory Visit: Payer: 59 | Admitting: Physical Therapy

## 2015-10-28 DIAGNOSIS — M545 Low back pain, unspecified: Secondary | ICD-10-CM

## 2015-10-28 DIAGNOSIS — G8929 Other chronic pain: Secondary | ICD-10-CM

## 2015-10-28 DIAGNOSIS — M629 Disorder of muscle, unspecified: Secondary | ICD-10-CM

## 2015-10-28 DIAGNOSIS — M2560 Stiffness of unspecified joint, not elsewhere classified: Secondary | ICD-10-CM

## 2015-10-28 DIAGNOSIS — M79604 Pain in right leg: Secondary | ICD-10-CM

## 2015-10-28 DIAGNOSIS — M2569 Stiffness of other specified joint, not elsewhere classified: Secondary | ICD-10-CM

## 2015-10-28 NOTE — Patient Instructions (Signed)
Pelvic Tilt    Flatten back by tightening stomach muscles and buttocks. Repeat __10__ times per set. Do __1__ sets per session. Do __1__ sessions per day.   Other basic back exercises:  Pelvic tilt, single knee to ckest, hamstring, calf stretch, upper and lower abdominal strength  http://orth.exer.us/134   Copyright  VHI. All rights reserved.

## 2015-10-28 NOTE — Therapy (Signed)
[place], [place], [address] Phone: [phone]   Fax:  [phone]  Physical Therapy Treatment  Patient Details  Name: [name] MRN: [id number] Date of Birth: [birth year] Referring Provider: Dr. Gordy Levan   Encounter Date: 10/28/2015      PT End of Session - 10/28/15 1500    Visit Number 8   Number of Visits 15   Date for PT Re-Evaluation 11/20/15   PT Start Time 1410   PT Stop Time 1500   PT Time Calculation (min) 50 min   Activity Tolerance Patient tolerated treatment well;No increased pain   Behavior During Therapy [facility] for tasks assessed/performed      Past Medical History  Diagnosis Date  . TOBACCO ABUSE 11/25/2006  . CORONARY ARTERY DISEASE 11/25/2006    Cardiac cath by Dr Terrence Dupont 11/2003 LV showed good LV systolic function, EF of 83-38%. Left main was patent. LAD has 20-30% mid stenosis. Diagonal 1 and diagonal 2 were patent. Left circumflex was patent. OM1 was less than 0.5 mm which was diffusely diseased. OM2 has 20% ostial stenosis which was patent which  was moderate size. OM3 was patent at prior PTCA and stented site. RCA has 20-30% proximal   . Hypertension   . Atrial arrhythmia   . Type II diabetes mellitus ([place])   . Cellulitis of knee, right 06/06/12  . Chronic otitis externa 06/06/12    C Multiple trials with antibiocs and antifungal. Cultures were positive for Pseudomonas. Follow up with ENT on 06/14/12    . NSTEMI (non-ST elevated myocardial infarction) ([place]) 08/14/2012    S/p Successful PTCA to proximal OM-3       Past Surgical History  Procedure Laterality Date  . Coronary angioplasty with stent placement  ~ 2005    "1"  . Cystectomy  1990's    LUA  . Left heart catheterization with coronary angiogram N/A 08/15/2012    Procedure: LEFT HEART CATHETERIZATION WITH CORONARY ANGIOGRAM;  Surgeon: Clent Demark, MD;  Location: [facility] CATH LAB;  Service: Cardiovascular;  Laterality: N/A;  .  Percutaneous coronary stent intervention (pci-s)  08/15/2012    Procedure: PERCUTANEOUS CORONARY STENT INTERVENTION (PCI-S);  Surgeon: Clent Demark, MD;  Location: [facility] CATH LAB;  Service: Cardiovascular;;    There were no vitals filed for this visit.  Visit Diagnosis:  Low back pain radiating to both legs  Decreased range of motion of trunk and back  Low back pain of over 3 months duration  Hamstring tightness of both lower extremities      Subjective Assessment - 10/28/15 1414    Subjective Leg pain is bad .  Has about 20 minutes of exercises for home already.  I may need another injection.   Currently in Pain? Yes   Pain Score 6   last night 8/10   Pain Location Back   Pain Orientation Right;Lateral   Pain Descriptors / Indicators --  hurts   Pain Radiating Towards Lateral leg, lower   Aggravating Factors  night   Pain Relieving Factors walking in the night   Multiple Pain Sites No                         [facility] Adult PT Treatment/Exercise - 10/28/15 1423    Lumbar Exercises: Stretches   Passive Hamstring Stretch 3 reps;30 seconds  added to home exercises   Single Knee to Chest Stretch 5 reps;10 seconds  added to  home   Lower Trunk Rotation 5 reps  10 seconds   Lower Trunk Rotation Limitations calf stretch 3 reps , 30 seconds.   Pelvic Tilt --  10 X 5 seconds   Standing Extension --  could not centralize pain,    Piriformis Stretch Limitations verbally reviewed   Lumbar Exercises: Standing   Wall Slides 10 reps  cues   Lumbar Exercises: Supine   Other Supine Lumbar Exercises lower and upper abdominal stabilization.                PT Education - 10/28/15 1500    Education provided Yes   Education Details basic back exercises from ex drawer   Person(s) Educated Patient   Methods Explanation;Demonstration;Verbal cues;Handout;Tactile cues   Comprehension Verbalized understanding;Returned demonstration          PT Short Term Goals  - 10/28/15 1502    PT SHORT TERM GOAL #1   Title Pt. will be I with initial HEP   Time 2   Period Weeks   Status Achieved           PT Long Term Goals - 10/28/15 1502    PT LONG TERM GOAL #1   Time 4   Period Weeks   Status On-going   PT LONG TERM GOAL #2   Title Pt will report decreased pain with sit to stand/ transitional movements by 50% in order to improve ease of mobility (10/15/15)   Time 4   Period Weeks   Status Partially Met   PT LONG TERM GOAL #3   Title Pt will report decreased pain/stiffness upon waking in the mornings (relief from exercises prior to getting up)by 50% (10/15/15)   Period Weeks   Status On-going   PT LONG TERM GOAL #4   Title Pt will report improved ability to stand with ease and without back or leg pain (improved 50%) 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  progress toward home3 exercise goals,   PT Next Visit Plan review basic back   PT Home Exercise Plan basic back   Consulted and Agree with Plan of Care Patient        Problem List Patient Active Problem List   Diagnosis Date Noted  . Dry skin dermatitis 09/26/2015  . Pain in the chest   . Chest pain 04/10/2015  . Tooth infection 02/05/2015  . Right lumbar radiculitis 11/19/2014  . Insomnia 05/22/2014  . Rash and nonspecific skin eruption 05/22/2014  . Bilateral lower extremity edema 05/22/2014  . Healthcare maintenance 12/13/2013  . Greater trochanteric bursitis of left hip 05/16/2013  . Dysphagia, unspecified(787.20) 05/10/2013  . Lumbosacral spondylosis  without myelopathy 04/18/2013  . Lumbar facet arthropathy 04/18/2013  . Sacroiliac joint dysfunction of left side 02/21/2013  . Leg length discrepancy 02/21/2013  . Seborrhea capitis 01/23/2013  . NSTEMI (non-ST elevated myocardial infarction) ([place]) 08/14/2012  . Tobacco abuse 08/08/2012  . Otitis externa 03/16/2012  . Hypertension 09/25/2011  .  GERD 04/08/2010  . Chronic radicular low back pain 07/20/2007  . Diabetes mellitus type 2, controlled ([place]) 11/25/2006  . HYPOGONADISM 11/25/2006  . Coronary atherosclerosis 11/25/2006  . Atrial fibrillation ([place]) 11/25/2006    Jesselee Poth 10/28/2015, 3:06 PM  [facility] [address] [place], [place], [address] Phone: [phone]   Fax:  [phone]  Name: [name] MRN: [id number] Date of Birth: [birth year]    .Melvenia Needles, PTA 10/28/2015 3:06 PM Phone: [phone] Fax: [phone]

## 2015-10-31 ENCOUNTER — Ambulatory Visit (INDEPENDENT_AMBULATORY_CARE_PROVIDER_SITE_OTHER): Payer: 59 | Admitting: Internal Medicine

## 2015-10-31 ENCOUNTER — Encounter: Payer: Self-pay | Admitting: Internal Medicine

## 2015-10-31 VITALS — BP 132/71 | HR 62 | Temp 97.5°F | Ht 75.0 in | Wt 280.9 lb

## 2015-10-31 DIAGNOSIS — G8929 Other chronic pain: Secondary | ICD-10-CM | POA: Diagnosis not present

## 2015-10-31 DIAGNOSIS — M5416 Radiculopathy, lumbar region: Secondary | ICD-10-CM | POA: Diagnosis not present

## 2015-10-31 DIAGNOSIS — E119 Type 2 diabetes mellitus without complications: Secondary | ICD-10-CM

## 2015-10-31 DIAGNOSIS — K0889 Other specified disorders of teeth and supporting structures: Secondary | ICD-10-CM

## 2015-10-31 DIAGNOSIS — Z7984 Long term (current) use of oral hypoglycemic drugs: Secondary | ICD-10-CM | POA: Diagnosis not present

## 2015-10-31 DIAGNOSIS — K047 Periapical abscess without sinus: Secondary | ICD-10-CM

## 2015-10-31 LAB — POCT GLYCOSYLATED HEMOGLOBIN (HGB A1C): Hemoglobin A1C: 6.8

## 2015-10-31 LAB — GLUCOSE, CAPILLARY: Glucose-Capillary: 175 mg/dL — ABNORMAL HIGH (ref 65–99)

## 2015-10-31 MED ORDER — GABAPENTIN 300 MG PO CAPS: 300.0000 mg | ORAL_CAPSULE | Freq: Three times a day (TID) | ORAL | Status: DC

## 2015-10-31 NOTE — Patient Instructions (Addendum)
Increase your dose of gabapentin from 100mg  three times per day to 300mg  three times per day. The most common side effect of this medication is drowsiness. Please call the clinic in approximately 1-2 weeks to let us know how you are tolerating this medication and if there is any pain improvement.  Take extra strength Tylenol (500mg ) TWO pills THREE times daily for your mouth and joint pains. Do not take this dose while taking other tylenol containing products.  Your Hemoglobin A1c is 6.8% today. This is consistent with mild diabetes and we may need to consider medications if it cannot be improved more with diet and lifestyle changes. In particular including exercise, eating more vegetables and lean meats, and losing weight.

## 2015-10-31 NOTE — Progress Notes (Signed)
Subjective:   Patient ID: [name] male   DOB: 11-Jan-1956 59 y.o.   MRN: [id number]  HPI: [name] is a 59 y.o. man with past medical history as described below who is presenting for follow-up of his worsened right lower back and leg pain. He has chronic lower back pain for which he recently had MRI showing minimal anterolisthesis and bilateral foraminal narrowing at the L5-S1 level. He is compliant with physical therapy appointments and states he is doing his stretches as directed at home. His biggest complaint is difficulty sleeping due to persistent leg pain. He is currently taking no medications for his pain.  See problem based assessment and plan below for additional details  Past Medical History  Diagnosis Date  . TOBACCO ABUSE 11/25/2006  . CORONARY ARTERY DISEASE 11/25/2006    Cardiac cath by Dr Terrence Dupont 11/2003 LV showed good LV systolic function, EF of 0000000. Left main was patent. LAD has 20-30% mid stenosis. Diagonal 1 and diagonal 2 were patent. Left circumflex was patent. OM1 was less than 0.5 mm which was diffusely diseased. OM2 has 20% ostial stenosis which was patent which  was moderate size. OM3 was patent at prior PTCA and stented site. RCA has 20-30% proximal   . Hypertension   . Atrial arrhythmia   . Type II diabetes mellitus ([place])   . Cellulitis of knee, right 06/06/12  . Chronic otitis externa 06/06/12    C Multiple trials with antibiocs and antifungal. Cultures were positive for Pseudomonas. Follow up with ENT on 06/14/12    . NSTEMI (non-ST elevated myocardial infarction) ([place]) 08/14/2012    S/p Successful PTCA to proximal OM-3      Current Outpatient Prescriptions  Medication Sig Dispense Refill  . acetaminophen (TYLENOL) 500 MG tablet Take 1 tablet (500 mg total) by mouth every 8 (eight) hours as needed for fever. 30 tablet 0  . aspirin EC 81 MG tablet Take 1 tablet (81 mg total) by mouth daily. 90 tablet 3  . diclofenac sodium (VOLTAREN) 1 % GEL Apply 2 g  topically 4 (four) times daily. to area of pain in the back. 1 Tube 1  . gabapentin (NEURONTIN) 100 MG capsule Take 1 capsule (100 mg total) by mouth 3 (three) times daily. 90 capsule 2  . glipiZIDE (GLUCOTROL) 5 MG tablet Take 1 tablet (5 mg total) by mouth daily. 30 tablet 3  . isosorbide mononitrate (IMDUR) 30 MG 24 hr tablet TAKE ONE TABLET BY MOUTH ONCE DAILY 90 tablet 3  . metFORMIN (GLUCOPHAGE) 1000 MG tablet TAKE ONE TABLET BY MOUTH TWICE DAILY WITH MEALS 180 tablet 3  . NITROSTAT 0.4 MG SL tablet DISSOLVE ONE TABLET UNDER THE TONGUE EVERY 5 MINUTES AS NEEDED FOR CHEST PAIN.  DO NOT EXCEED A TOTAL OF 3 DOSES IN 15 MINUTES 30 tablet 0  . polyvinyl alcohol (LIQUIFILM TEARS) 1.4 % ophthalmic solution Place 1 drop into both eyes as needed for dry eyes. 15 mL 0  . pravastatin (PRAVACHOL) 40 MG tablet Take 1 tablet (40 mg total) by mouth daily. 90 tablet 3  . selenium sulfide (SELSUN) 2.5 % shampoo     . sotalol (BETAPACE) 80 MG tablet TAKE ONE-HALF TABLET BY MOUTH TWICE DAILY 180 tablet 0  . triamcinolone lotion (KENALOG) 0.1 % Apply topically 2 (two) times daily. 60 mL 1  . XARELTO 20 MG TABS tablet Take 20 mg by mouth daily.      No current facility-administered medications for this visit.  No family history on file. Social History   Social History  . Marital Status: Married    Spouse Name: N/A  . Number of Children: N/A  . Years of Education: N/A   Social History Main Topics  . Smoking status: Former Smoker -- 0.20 packs/day for 12 years    Types: Cigarettes    Quit date: 11/29/2014  . Smokeless tobacco: Never Used     Comment: quit about 58mths ago  . Alcohol Use: No  . Drug Use: No  . Sexual Activity: Yes   Other Topics Concern  . Not on file   Social History Narrative   Review of Systems: Review of Systems  Constitutional: Negative for malaise/fatigue.  Respiratory: Negative for shortness of breath.   Cardiovascular: Negative for claudication and leg swelling.    Gastrointestinal: Negative for abdominal pain.  Musculoskeletal: Positive for back pain and joint pain. Negative for falls.  Neurological: Negative for tingling and weakness.    Objective:  Physical Exam: Filed Vitals:   10/31/15 0934  BP: 132/71  Pulse: 62  Temp: 97.5 F (36.4 C)  TempSrc: Oral  Height: 6\' 3"  (1.905 m)  Weight: 280 lb 14.4 oz (127.415 kg)  SpO2: 99%   GENERAL- alert, co-operative, NAD HEENT- Atraumatic, oral mucosa appears moist, second right mandibular molar with surrounding partially receded gingiva CARDIAC- RRR, no murmurs, rubs or gallops. RESP- CTAB, no wheezes or crackles. BACK- Normal curvature, mild paraspinal tenderness bilaterally NEURO-  Strength upper and lower extremities- 5/5, Sensation intact- globally, Gait- Normal. EXTREMITIES- symmetric, no pedal edema, no deformity of right foot or knee, straight leg test positive at about 45 PSYCH- Normal mood and affect, appropriate thought content and speech.    Assessment & Plan:

## 2015-11-02 NOTE — Assessment & Plan Note (Signed)
Assessment: Chronic right mandibular second molar pain that has been present much of this year. On exam there is some gingival inflammation and recession however no abscess or severe infection. He is not having symptoms that limit his oral intake. He has not had any fevers or chills. No current evidence of a infection requiring antibiotics right now. There are noticeable cavities and he would definitely benefit from tooth extraction and follow-up with a dentist. He reports he currently has no dental insurance and this is his main reason for getting no treatment up to now.  Plan: Patient given contact information for for [employer] dental school in [place] as a possible option for affordable uninsured care

## 2015-11-02 NOTE — Assessment & Plan Note (Signed)
Assessment: Still having moderate to severe chronic lower back pain with right leg radiculopathy. Physical exam consistent with these findings. He is already compliant with non-pharmacotherapy through PT stretches as instructed and sees monthly chiropractor. He has not made progress with weight reduction. Currently his medical management is still low dosing. He was unable to be started on Lyrica due to insurance not covering this medication.  Plan: Increased gabapentin to 300 mg 3 times a day Recommend high strength Tylenol 1g 3 times a day during ongoing pain Recommended against him using other NSAID treatment with history of hypertension and CAD

## 2015-11-02 NOTE — Assessment & Plan Note (Signed)
Lab Results  Component Value Date   HGBA1C 6.8 10/31/2015   HGBA1C 6.5 07/10/2015   HGBA1C 6.5 04/17/2015     Assessment: Diabetes control: Controlled Progress toward A1C goal:  Worsening Comments: He was previously being controlled on metformin 1 g once daily plus glipizide 10 mg daily with hemoglobin A1c of 6.5%. We will decrease in his glipizide dose to 5 mg he now has hemoglobin A1c of 6.8%. This is adjusted due to episodic hypoglycemiaquestion was symptomatic on his previous dosing.  Plan: Medications:  Continue current medications Home glucose monitoring: Does not need to daily glucose monitoring at this time Instruction/counseling given: reminded to bring medications to each visit, discussed the need for weight loss and discussed diet Educational resources provided:   Self management tools provided:   Other plans: Recheck hemoglobin A1c in 3 months, if control is still worsening and he makes no progress with diet or weight change may need titration of medications back up

## 2015-11-04 ENCOUNTER — Ambulatory Visit: Payer: 59 | Attending: Internal Medicine | Admitting: Physical Therapy

## 2015-11-04 DIAGNOSIS — M545 Low back pain, unspecified: Secondary | ICD-10-CM

## 2015-11-04 DIAGNOSIS — R29898 Other symptoms and signs involving the musculoskeletal system: Secondary | ICD-10-CM | POA: Diagnosis present

## 2015-11-04 DIAGNOSIS — M629 Disorder of muscle, unspecified: Secondary | ICD-10-CM

## 2015-11-04 DIAGNOSIS — M2569 Stiffness of other specified joint, not elsewhere classified: Secondary | ICD-10-CM

## 2015-11-04 DIAGNOSIS — G8929 Other chronic pain: Secondary | ICD-10-CM | POA: Insufficient documentation

## 2015-11-04 DIAGNOSIS — M2560 Stiffness of unspecified joint, not elsewhere classified: Secondary | ICD-10-CM

## 2015-11-04 NOTE — Therapy (Signed)
[place], [place], [address] Phone: [phone]   Fax:  [phone]  Physical Therapy Treatment  Patient Details  Name: [name] MRN: [id number] Date of Birth: 1956-06-18 Referring Provider: Dr. Gordy Levan   Encounter Date: 11/04/2015      PT End of Session - 11/04/15 1737    Visit Number 9   Number of Visits 15   Date for PT Re-Evaluation 11/20/15   PT Start Time 1505   PT Stop Time 1600   PT Time Calculation (min) 55 min   Activity Tolerance Patient tolerated treatment well;No increased pain   Behavior During Therapy [facility] for tasks assessed/performed      Past Medical History  Diagnosis Date  . TOBACCO ABUSE 11/25/2006  . CORONARY ARTERY DISEASE 11/25/2006    Cardiac cath by Dr Terrence Dupont 11/2003 LV showed good LV systolic function, EF of 0000000. Left main was patent. LAD has 20-30% mid stenosis. Diagonal 1 and diagonal 2 were patent. Left circumflex was patent. OM1 was less than 0.5 mm which was diffusely diseased. OM2 has 20% ostial stenosis which was patent which  was moderate size. OM3 was patent at prior PTCA and stented site. RCA has 20-30% proximal   . Hypertension   . Atrial arrhythmia   . Type II diabetes mellitus ([place])   . Cellulitis of knee, right 06/06/12  . Chronic otitis externa 06/06/12    C Multiple trials with antibiocs and antifungal. Cultures were positive for Pseudomonas. Follow up with ENT on 06/14/12    . NSTEMI (non-ST elevated myocardial infarction) ([place]) 08/14/2012    S/p Successful PTCA to proximal OM-3       Past Surgical History  Procedure Laterality Date  . Coronary angioplasty with stent placement  ~ 2005    "1"  . Cystectomy  1990's    LUA  . Left heart catheterization with coronary angiogram N/A 08/15/2012    Procedure: LEFT HEART CATHETERIZATION WITH CORONARY ANGIOGRAM;  Surgeon: Clent Demark, MD;  Location: [facility] CATH LAB;  Service: Cardiovascular;  Laterality: N/A;  .  Percutaneous coronary stent intervention (pci-s)  08/15/2012    Procedure: PERCUTANEOUS CORONARY STENT INTERVENTION (PCI-S);  Surgeon: Clent Demark, MD;  Location: [facility] CATH LAB;  Service: Cardiovascular;;    There were no vitals filed for this visit.  Visit Diagnosis:  Decreased range of motion of trunk and back  Low back pain of over 3 months duration  Hamstring tightness of both lower extremities      Subjective Assessment - 11/04/15 1505    Subjective 4/10 back, Rt leg pain constant.  He does his exercises each morning.  Sometimes it takes longer for his pain to ease off lately.   Patient is accompained by: Family member   Currently in Pain? Yes   Pain Score 4    Pain Location Back   Pain Orientation Right   Pain Descriptors / Indicators Aching;Constant   Pain Radiating Towards Lateral leg, stomach   Pain Frequency Constant   Aggravating Factors  night, morning    Pain Relieving Factors exercises, walking in the night   Multiple Pain Sites No                         [facility] Adult PT Treatment/Exercise - 11/04/15 1515    Lumbar Exercises: Stretches   Single Knee to Chest Stretch 5 reps;10 seconds  each, makes me feel good   Pelvic Tilt --  10 X 5 seconds   Lumbar Exercises: Aerobic   Tread Mill 2.3 MPH, level .18 mile   Lumbar Exercises: Supine   Bridge 10 reps   Bridge Limitations With clams 10 X 2-3 in a row   Lumbar Exercises: Sidelying   Clam 10 reps  each side, cues   Traction   Type of Traction Lumbar   Min (lbs) 50   Max (lbs) 110   Hold Time 60   Rest Time 15   Time 17                  PT Short Term Goals - 10/28/15 1502    PT SHORT TERM GOAL #1   Title Pt. will be I with initial HEP   Time 2   Period Weeks   Status Achieved           PT Long Term Goals - 11/04/15 1739    PT LONG TERM GOAL #1   Title Pt. will be I with advanced HEP (10/15/15)   Baseline independent with exercises so far.   Time 4   Period Weeks    PT LONG TERM GOAL #2   Title Pt will report decreased pain with sit to stand/ transitional movements by 50% in order to improve ease of mobility (10/15/15)   Time 4   Period Weeks   Status Unable to assess   PT LONG TERM GOAL #3   Title Pt will report decreased pain/stiffness upon waking in the mornings (relief from exercises prior to getting up)by 50% (10/15/15)   Baseline unchanged in am, lasting a little longer   Time 4   Period Weeks   Status On-going   PT LONG TERM GOAL #4   Title Pt will report improved ability to stand with ease and without back or leg pain (improved 50%) 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  "I feel better than I have in a long time"     PT Next Visit Plan core, hip strengthening   PT Home Exercise Plan continue basic back   Consulted and Agree with Plan of Care Patient        Problem List Patient Active Problem List   Diagnosis Date Noted  . Dry skin dermatitis 09/26/2015  . Pain in the chest   . Tooth infection 02/05/2015  . Right lumbar radiculitis 11/19/2014  . Insomnia 05/22/2014  . Rash and nonspecific skin eruption 05/22/2014  . Bilateral lower extremity edema 05/22/2014  . Healthcare maintenance 12/13/2013  . Greater trochanteric bursitis of left hip 05/16/2013  . Dysphagia, unspecified(787.20) 05/10/2013  . Lumbosacral spondylosis without myelopathy 04/18/2013  . Lumbar facet arthropathy 04/18/2013  . Sacroiliac joint dysfunction of left side 02/21/2013  . Leg length  discrepancy 02/21/2013  . Seborrhea capitis 01/23/2013  . NSTEMI (non-ST elevated myocardial infarction) ([place]) 08/14/2012  . Tobacco abuse 08/08/2012  . Otitis externa 03/16/2012  . Hypertension  09/25/2011  . GERD 04/08/2010  . Chronic radicular low back pain 07/20/2007  . Diabetes mellitus type 2, controlled ([place]) 11/25/2006  . HYPOGONADISM 11/25/2006  . Coronary atherosclerosis 11/25/2006  . Atrial fibrillation ([place]) 11/25/2006    HARRIS,KAREN 11/04/2015, 5:41 PM  [facility] [address] [place], [place], [address] Phone: [phone]   Fax:  [phone]  Name: TASHON DELEE MRN: [id number] Date of Birth: 1956-03-25    Melvenia Needles, PTA 11/04/2015 5:41 PM Phone: [phone] Fax: [phone]

## 2015-11-04 NOTE — Progress Notes (Signed)
Internal Medicine Clinic Attending  I saw and evaluated the patient.  I personally confirmed the key portions of the history and exam documented by Dr. Rice and I reviewed pertinent patient test results.  The assessment, diagnosis, and plan were formulated together and I agree with the documentation in the resident's note.  

## 2015-11-11 ENCOUNTER — Ambulatory Visit: Payer: 59 | Admitting: Physical Therapy

## 2015-11-11 DIAGNOSIS — M79605 Pain in left leg: Secondary | ICD-10-CM

## 2015-11-11 DIAGNOSIS — M2560 Stiffness of unspecified joint, not elsewhere classified: Secondary | ICD-10-CM

## 2015-11-11 DIAGNOSIS — G8929 Other chronic pain: Secondary | ICD-10-CM

## 2015-11-11 DIAGNOSIS — M545 Low back pain, unspecified: Secondary | ICD-10-CM

## 2015-11-11 DIAGNOSIS — M2569 Stiffness of other specified joint, not elsewhere classified: Secondary | ICD-10-CM

## 2015-11-11 DIAGNOSIS — R29898 Other symptoms and signs involving the musculoskeletal system: Secondary | ICD-10-CM | POA: Diagnosis not present

## 2015-11-11 DIAGNOSIS — M629 Disorder of muscle, unspecified: Secondary | ICD-10-CM

## 2015-11-11 NOTE — Therapy (Signed)
[place], [place], [address] Phone: [phone]   Fax:  [phone]  Physical Therapy Treatment  Patient Details  Name: [name] MRN: [id number] Date of Birth: 1956/06/19 Referring Provider: Dr. Gordy Levan   Encounter Date: 11/11/2015      PT End of Session - 11/11/15 1457    Visit Number 10   Number of Visits 15   PT Start Time 1440   PT Stop Time 1530   PT Time Calculation (min) 50 min   Activity Tolerance Patient tolerated treatment well   Behavior During Therapy [facility] for tasks assessed/performed      Past Medical History  Diagnosis Date  . TOBACCO ABUSE 11/25/2006  . CORONARY ARTERY DISEASE 11/25/2006    Cardiac cath by Dr Terrence Dupont 11/2003 LV showed good LV systolic function, EF of 0000000. Left main was patent. LAD has 20-30% mid stenosis. Diagonal 1 and diagonal 2 were patent. Left circumflex was patent. OM1 was less than 0.5 mm which was diffusely diseased. OM2 has 20% ostial stenosis which was patent which  was moderate size. OM3 was patent at prior PTCA and stented site. RCA has 20-30% proximal   . Hypertension   . Atrial arrhythmia   . Type II diabetes mellitus ([place])   . Cellulitis of knee, right 06/06/12  . Chronic otitis externa 06/06/12    C Multiple trials with antibiocs and antifungal. Cultures were positive for Pseudomonas. Follow up with ENT on 06/14/12    . NSTEMI (non-ST elevated myocardial infarction) ([place]) 08/14/2012    S/p Successful PTCA to proximal OM-3       Past Surgical History  Procedure Laterality Date  . Coronary angioplasty with stent placement  ~ 2005    "1"  . Cystectomy  1990's    LUA  . Left heart catheterization with coronary angiogram N/A 08/15/2012    Procedure: LEFT HEART CATHETERIZATION WITH CORONARY ANGIOGRAM;  Surgeon: Clent Demark, MD;  Location: [facility] CATH LAB;  Service: Cardiovascular;  Laterality: N/A;  . Percutaneous coronary stent intervention (pci-s)   08/15/2012    Procedure: PERCUTANEOUS CORONARY STENT INTERVENTION (PCI-S);  Surgeon: Clent Demark, MD;  Location: [facility] CATH LAB;  Service: Cardiovascular;;    There were no vitals filed for this visit.  Visit Diagnosis:  Decreased range of motion of trunk and back  Low back pain of over 3 months duration  Hamstring tightness of both lower extremities  Low back pain radiating to both legs      Subjective Assessment - 11/11/15 1448    Subjective Rt. low back pain 4/10.    Currently in Pain? Yes   Pain Score 4    Pain Location Back   Pain Orientation Right   Pain Descriptors / Indicators Aching;Sore   Pain Type Chronic pain   Pain Onset More than a month ago   Pain Frequency Constant            [facility] Adult PT Treatment/Exercise - 11/11/15 1505    Self-Care   Other Self-Care Comments  anatomy, foraminal stenosis with model   Lumbar Exercises: Stretches   Single Knee to Chest Stretch 3 reps;30 seconds   Lower Trunk Rotation 2 reps;30 seconds   Lumbar Exercises: Aerobic   Stationary Bike NuStep L 5 UE and LE    Lumbar Exercises: Sidelying   Other Sidelying Lumbar Exercises to elongate Rt. trunk    Traction   Type of Traction Lumbar   Min (lbs) 60  Max (lbs) 110   Hold Time 60   Rest Time 15   Time 17     Multiple questions about L- spine, surgery, gym program and exercises.        PT Short Term Goals - 10/28/15 1502    PT SHORT TERM GOAL #1   Title Pt. will be I with initial HEP   Time 2   Period Weeks   Status Achieved           PT Long Term Goals - 11/04/15 1739    PT LONG TERM GOAL #1   Title Pt. will be I with advanced HEP (10/15/15)   Baseline independent with exercises so far.   Time 4   Period Weeks   PT LONG TERM GOAL #2   Title Pt will report decreased pain with sit to stand/ transitional movements by 50% in order to improve ease of mobility (10/15/15)   Time 4   Period Weeks   Status Unable to assess   PT LONG TERM GOAL #3   Title  Pt will report decreased pain/stiffness upon waking in the mornings (relief from exercises prior to getting up)by 50% (10/15/15)   Baseline unchanged in am, lasting a little longer   Time 4   Period Weeks   Status On-going   PT LONG TERM GOAL #4   Title Pt will report improved ability to stand with ease and without back or leg pain (improved 50%) 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  He understands HEP and he can reduce symptoms on his own.  He was given info on [facility]) and [facility] PT group.  He is considering one of those options after next/last visit.    PT Next Visit Plan DC, repeat traction? HEP questions.  FOTO? Final check of goals    Consulted and Agree with Plan of Care Patient          G-Codes - 11-16-15 1521    Functional Assessment Tool Used --   Functional Limitation --   Mobility: Walking and Moving Around Current Status [id number]) --   Mobility: Walking and Moving Around Goal Status [id number]) --      Problem List Patient Active Problem List   Diagnosis Date Noted  . Dry skin dermatitis 09/26/2015  . Pain in the chest   . Tooth infection 02/05/2015  . Right lumbar radiculitis 11/19/2014  . Insomnia 05/22/2014  . Rash and nonspecific skin eruption 05/22/2014  . Bilateral lower extremity edema 05/22/2014  . Healthcare maintenance 12/13/2013  . Greater trochanteric bursitis of left hip 05/16/2013  . Dysphagia, unspecified(787.20) 05/10/2013  . Lumbosacral  spondylosis without myelopathy 04/18/2013  . Lumbar facet arthropathy 04/18/2013  . Sacroiliac joint dysfunction of left side 02/21/2013  . Leg length discrepancy 02/21/2013  . Seborrhea capitis 01/23/2013  . NSTEMI (non-ST elevated myocardial infarction) ([place]) 08/14/2012  .  Tobacco abuse 08/08/2012  . Otitis externa 03/16/2012  . Hypertension 09/25/2011  . GERD 04/08/2010  . Chronic radicular low back pain 07/20/2007  . Diabetes mellitus type 2, controlled ([place]) 11/25/2006  . HYPOGONADISM 11/25/2006  . Coronary atherosclerosis 11/25/2006  . Atrial fibrillation ([place]) 11/25/2006    PAA,JENNIFER 11/11/2015, 3:24 PM  [facility] [address] [place], [place], [address] Phone: [phone]   Fax:  [phone]  Name: SHOURYA PERROT MRN: [id number] Date of Birth: [birth year]   Raeford Razor, PT 11/11/2015 3:25 PM Phone: [phone] Fax: [phone]

## 2015-11-14 ENCOUNTER — Other Ambulatory Visit: Payer: Self-pay | Admitting: Internal Medicine

## 2015-11-18 ENCOUNTER — Ambulatory Visit: Payer: 59 | Admitting: Physical Therapy

## 2015-11-18 DIAGNOSIS — M2560 Stiffness of unspecified joint, not elsewhere classified: Secondary | ICD-10-CM

## 2015-11-18 DIAGNOSIS — M545 Low back pain, unspecified: Secondary | ICD-10-CM

## 2015-11-18 DIAGNOSIS — M629 Disorder of muscle, unspecified: Secondary | ICD-10-CM

## 2015-11-18 DIAGNOSIS — G8929 Other chronic pain: Secondary | ICD-10-CM

## 2015-11-18 DIAGNOSIS — R29898 Other symptoms and signs involving the musculoskeletal system: Secondary | ICD-10-CM | POA: Diagnosis not present

## 2015-11-18 DIAGNOSIS — M79604 Pain in right leg: Secondary | ICD-10-CM

## 2015-11-18 DIAGNOSIS — M2569 Stiffness of other specified joint, not elsewhere classified: Secondary | ICD-10-CM

## 2015-11-18 NOTE — Therapy (Signed)
[place], [place], [address] Phone: [phone]   Fax:  [phone]  Physical Therapy Treatment/Discharge  Patient Details  Name: [name] MRN: [id number] Date of Birth: Apr 19, 1956 Referring Provider: Dr. Gordy Levan   Encounter Date: 11/18/2015      PT End of Session - 11/18/15 1543    Visit Number 11   Number of Visits 15   PT Start Time 1503   PT Stop Time 1558   PT Time Calculation (min) 55 min   Activity Tolerance Patient tolerated treatment well   Behavior During Therapy [facility] for tasks assessed/performed      Past Medical History  Diagnosis Date  . TOBACCO ABUSE 11/25/2006  . CORONARY ARTERY DISEASE 11/25/2006    Cardiac cath by Dr Terrence Dupont 11/2003 LV showed good LV systolic function, EF of 65-46%. Left main was patent. LAD has 20-30% mid stenosis. Diagonal 1 and diagonal 2 were patent. Left circumflex was patent. OM1 was less than 0.5 mm which was diffusely diseased. OM2 has 20% ostial stenosis which was patent which  was moderate size. OM3 was patent at prior PTCA and stented site. RCA has 20-30% proximal   . Hypertension   . Atrial arrhythmia   . Type II diabetes mellitus ([place])   . Cellulitis of knee, right 06/06/12  . Chronic otitis externa 06/06/12    C Multiple trials with antibiocs and antifungal. Cultures were positive for Pseudomonas. Follow up with ENT on 06/14/12    . NSTEMI (non-ST elevated myocardial infarction) ([place]) 08/14/2012    S/p Successful PTCA to proximal OM-3       Past Surgical History  Procedure Laterality Date  . Coronary angioplasty with stent placement  ~ 2005    "1"  . Cystectomy  1990's    LUA  . Left heart catheterization with coronary angiogram N/A 08/15/2012    Procedure: LEFT HEART CATHETERIZATION WITH CORONARY ANGIOGRAM;  Surgeon: Clent Demark, MD;  Location: [facility] CATH LAB;  Service: Cardiovascular;  Laterality: N/A;  . Percutaneous coronary stent intervention  (pci-s)  08/15/2012    Procedure: PERCUTANEOUS CORONARY STENT INTERVENTION (PCI-S);  Surgeon: Clent Demark, MD;  Location: [facility] CATH LAB;  Service: Cardiovascular;;    There were no vitals filed for this visit.  Visit Diagnosis:  Decreased range of motion of trunk and back  Low back pain of over 3 months duration  Hamstring tightness of both lower extremities  Low back pain radiating to both legs      Subjective Assessment - 11/18/15 1509    Subjective I'm OK, Lt LE 3/10, no back pain.  Wants to repeat traction.  DC today.    Currently in Pain? Yes   Pain Score 3    Pain Location Leg   Pain Orientation Left   Pain Descriptors / Indicators Aching   Pain Type Chronic pain   Pain Onset More than a month ago   Pain Frequency Intermittent  moves around   Multiple Pain Sites No            [facility] PT Assessment - 11/18/15 1535    Strength   Right Hip Flexion 4+/5   Right Hip Extension 3+/5   Right Hip ABduction 4+/5   Left Hip Flexion 4+/5   Left Hip Extension 3+/5   Left Hip ABduction 4/5                     [facility] Adult PT Treatment/Exercise -  12-17-15 1516    Lumbar Exercises: Aerobic   Stationary Bike NuStep L 7 UE and LE 8 min    Lumbar Exercises: Machines for Strengthening   Cybex Knee Extension 3 sets bilat.    Leg Press 3 sets, x 10-20 with increasingly more weight (upto 3 plates). reinforced safety.    Other Lumbar Machine Exercise high row 3 plates x 10, 2 sets    Other Lumbar Machine Exercise Freemotion row, and alternating punch, 2-3 plates x 15   lunge for balance and LE strength 2 plates added a punch    Lumbar Exercises: Sidelying   Hip Abduction 10 reps   Other Sidelying Lumbar Exercises sidelying to elongate and "traction" LE with symptoms    Lumbar Exercises: Prone   Straight Leg Raise 10 reps   Moist Heat Therapy   Number Minutes Moist Heat 15 Minutes   Moist Heat Location Lumbar Spine                PT Education - 17-Dec-2015  1543    Education provided Yes   Education Details gym ex, safety with machines, core   Person(s) Educated Patient   Methods Explanation;Demonstration   Comprehension Verbalized understanding;Returned demonstration          PT Short Term Goals - 10/28/15 1502    PT SHORT TERM GOAL #1   Title Pt. will be I with initial HEP   Time 2   Period Weeks   Status Achieved           PT Long Term Goals - 12/17/2015 1544    PT LONG TERM GOAL #1   Title Pt. 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 will demo improved hamstring length by 10 degrees bilaterally to provide relief to low back (10/15/15)   Status Achieved               Plan - 2015/12/17 1544    Clinical Impression Statement Pt plans to cont at [facility]. DC.     PT Next Visit Plan NA   Consulted and Agree with Plan of Care Patient          G-Codes - 12/17/15 1545    Functional Assessment Tool Used clinical judgement   Functional Limitation Mobility: Walking and moving around   Mobility: Walking and Moving Around Current Status (M3536) At least 20 percent but less than 40 percent impaired, limited or  restricted   Mobility: Walking and Moving Around Goal Status (R4431) At least 20 percent but less than 40 percent impaired, limited or restricted  Mobility: Walking and Moving Around Discharge Status [phone]) At least 20 percent but less than 40 percent impaired, limited or restricted      Problem List Patient Active Problem List   Diagnosis Date Noted  . Dry skin dermatitis 09/26/2015  . Pain in the chest   . Tooth infection 02/05/2015  . Right lumbar radiculitis 11/19/2014  . Insomnia 05/22/2014  . Rash and nonspecific skin eruption 05/22/2014  . Bilateral lower extremity edema 05/22/2014  . Healthcare maintenance 12/13/2013  . Greater trochanteric bursitis of left hip 05/16/2013  . Dysphagia, unspecified(787.20) 05/10/2013  . Lumbosacral spondylosis without myelopathy 04/18/2013  . Lumbar facet arthropathy 04/18/2013  . Sacroiliac joint dysfunction of left side 02/21/2013  . Leg length discrepancy 02/21/2013  . Seborrhea capitis 01/23/2013  . NSTEMI (non-ST elevated myocardial infarction) ([place]) 08/14/2012  . Tobacco abuse 08/08/2012  . Otitis externa 03/16/2012  . Hypertension 09/25/2011  . GERD 04/08/2010  . Chronic radicular low back pain 07/20/2007  . Diabetes mellitus type 2, controlled ([place]) 11/25/2006  . HYPOGONADISM 11/25/2006  . Coronary atherosclerosis 11/25/2006  . Atrial fibrillation ([place]) 11/25/2006    Yael Angerer 11/18/2015, 3:46 PM  [facility] [address] [place], [place], [address] Phone: [phone]   Fax:  [phone]  Name: SAAFIR ABDULLAH MRN: [id number] Date of Birth: 04/18/1956   Raeford Razor, PT 11/18/2015 3:47 PM Phone: [phone] Fax: [phone]   PHYSICAL THERAPY DISCHARGE SUMMARY  Visits from Start of Care: 11  Current functional level related to goals / functional outcomes: See above goals   Remaining deficits: Pain, ROM in hips, core strength, endurance   Education /  Equipment: HEP, Core, gym ex, anatomy of condition Plan: Patient agrees to discharge.  Patient goals were partially met. Patient is being discharged due to lack of progress.  ?????   Plateau of progress with a chronic condition.   Raeford Razor, PT 11/18/2015 3:50 PM Phone: [phone] Fax: [phone]

## 2015-12-15 ENCOUNTER — Other Ambulatory Visit: Payer: Self-pay | Admitting: Internal Medicine

## 2015-12-22 ENCOUNTER — Other Ambulatory Visit: Payer: Self-pay | Admitting: *Deleted

## 2015-12-22 DIAGNOSIS — E785 Hyperlipidemia, unspecified: Secondary | ICD-10-CM

## 2015-12-23 MED ORDER — PRAVASTATIN SODIUM 40 MG PO TABS: 40.0000 mg | ORAL_TABLET | Freq: Every day | ORAL | Status: DC

## 2015-12-26 ENCOUNTER — Encounter: Payer: Self-pay | Admitting: Internal Medicine

## 2015-12-26 DIAGNOSIS — Z7901 Long term (current) use of anticoagulants: Secondary | ICD-10-CM | POA: Insufficient documentation

## 2016-01-05 ENCOUNTER — Encounter: Payer: Self-pay | Admitting: Internal Medicine

## 2016-01-05 ENCOUNTER — Ambulatory Visit (INDEPENDENT_AMBULATORY_CARE_PROVIDER_SITE_OTHER): Payer: BLUE CROSS/BLUE SHIELD | Admitting: Internal Medicine

## 2016-01-05 VITALS — BP 151/80 | HR 72 | Temp 98.2°F | Ht 75.0 in | Wt 283.9 lb

## 2016-01-05 DIAGNOSIS — H579 Unspecified disorder of eye and adnexa: Secondary | ICD-10-CM | POA: Insufficient documentation

## 2016-01-05 DIAGNOSIS — H578 Other specified disorders of eye and adnexa: Secondary | ICD-10-CM

## 2016-01-05 LAB — GLUCOSE, CAPILLARY: Glucose-Capillary: 205 mg/dL — ABNORMAL HIGH (ref 65–99)

## 2016-01-05 MED ORDER — KETOROLAC TROMETHAMINE 0.4 % OP SOLN: 1.0000 [drp] | Freq: Four times a day (QID) | OPHTHALMIC | Status: DC

## 2016-01-05 MED ORDER — ERYTHROMYCIN 5 MG/GM OP OINT: 1.0000 "application " | TOPICAL_OINTMENT | Freq: Three times a day (TID) | OPHTHALMIC | Status: DC

## 2016-01-05 NOTE — Assessment & Plan Note (Signed)
Suspect the patient did at one time have a foreign body in the lower outer quadrant, now not able to locate anything in particular, unable to utilize fluorescein. Patient does have significant erythema and conjunctival irritation. Eye is watering, but no purulent drainage. Suspect he now has conjunctival abrasion causing his discomfort rather than a true foreign body.  -Given Rx for Ketorolac eye drops to use qid for pain -Rx for erythromycin eye drops for prophylactic treatment of possible infectious complication. Will use for 5 days, then stop.  -Patient to return in 1 week for close follow up. Instructed patient that if it gets worse he need to return to the clinic and get a referral to opthalmology.

## 2016-01-05 NOTE — Patient Instructions (Signed)
1. Please make a follow up for 1 week.   If your symptoms get worse or fail to improve, please make an appointment sooner so we can refer you to an eye doctor.   2. Please take all medications as previously prescribed with the following changes:  Use erythromycin eye drops in the left eye 3 times daily for 5 days.   Also use Ketorolac eye drops up to 4 times daily AS NEEDED FOR PAIN.   3. If you have worsening of your symptoms or new symptoms arise, please call the clinic [phone]), or go to the ER immediately if symptoms are severe.   Corneal Abrasion The cornea is the clear covering at the front and center of the eye. When looking at the colored portion of the eye (iris), you are looking through the cornea. This very thin tissue is made up of many layers. The surface layer is a single layer of cells (corneal epithelium) and is one of the most sensitive tissues in the body. If a scratch or injury causes the corneal epithelium to come off, it is called a corneal abrasion. If the injury extends to the tissues below the epithelium, the condition is called a corneal ulcer. CAUSES   Scratches.  Trauma.  Foreign body in the eye. Some people have recurrences of abrasions in the area of the original injury even after it has healed (recurrent erosion syndrome). Recurrent erosion syndrome generally improves and goes away with time. SYMPTOMS   Eye pain.  Difficulty or inability to keep the injured eye open.  The eye becomes very sensitive to light.  Recurrent erosions tend to happen suddenly, first thing in the morning, usually after waking up and opening the eye. DIAGNOSIS  Your health care provider can diagnose a corneal abrasion during an eye exam. Dye is usually placed in the eye using a drop or a small paper strip moistened by your tears. When the eye is examined with a special light, the abrasion shows up clearly because of the dye. TREATMENT   Small abrasions may be treated with  antibiotic drops or ointment alone.  A pressure patch may be put over the eye. If this is done, follow your doctor's instructions for when to remove the patch. Do not drive or use machines while the eye patch is on. Judging distances is hard to do with a patch on. If the abrasion becomes infected and spreads to the deeper tissues of the cornea, a corneal ulcer can result. This is serious because it can cause corneal scarring. Corneal scars interfere with light passing through the cornea and cause a loss of vision in the involved eye. HOME CARE INSTRUCTIONS  Use medicine or ointment as directed. Only take over-the-counter or prescription medicines for pain, discomfort, or fever as directed by your health care provider.  Do not drive or operate machinery if your eye is patched. Your ability to judge distances is impaired.  If your health care provider has given you a follow-up appointment, it is very important to keep that appointment. Not keeping the appointment could result in a severe eye infection or permanent loss of vision. If there is any problem keeping the appointment, let your health care provider know. SEEK MEDICAL CARE IF:   You have pain, light sensitivity, and a scratchy feeling in one eye or both eyes.  Your pressure patch keeps loosening up, and you can blink your eye under the patch after treatment.  Any kind of discharge develops from the eye  after treatment or if the lids stick together in the morning.  You have the same symptoms in the morning as you did with the original abrasion days, weeks, or months after the abrasion healed.   This information is not intended to replace advice given to you by your health care provider. Make sure you discuss any questions you have with your health care provider.   Document Released: 11/12/2000 Document Revised: 08/06/2015 Document Reviewed: 07/23/2013 Elsevier Interactive Patient Education [employer].

## 2016-01-05 NOTE — Progress Notes (Signed)
Subjective:   Patient ID: [name] male   DOB: [birth year] 60 y.o.   MRN: [id number]  HPI: [name] is a 60 y.o. male w/ PMHx of CAD, HTN, DM type II, atrial fibrillation, and tobacco abuse, presents to the clinic today for an acute visit for foreign body sensation in his left eye. Patient says this pain started this AM when he woke up from sleep. It has been causing him pain and irritation all morning. He said he has been tearing from the eye and turned red as soon as he woke up. He has not been able to locate anything within the eye causing his discomfort. Only clear discharge. No other people in his household have similar complaints. No vision changes, no fever, chills, oral ulcers, swelling, or involvement of the opposite eye. No headache. Does not wear contact lenses, was not working with wood, fiberglass, metal, or other potentially harmful products that could have been harmful to his eye.   Past Medical History  Diagnosis Date  . TOBACCO ABUSE 11/25/2006  . CORONARY ARTERY DISEASE 11/25/2006    Cardiac cath by Dr Terrence Dupont 11/2003 LV showed good LV systolic function, EF of 0000000. Left main was patent. LAD has 20-30% mid stenosis. Diagonal 1 and diagonal 2 were patent. Left circumflex was patent. OM1 was less than 0.5 mm which was diffusely diseased. OM2 has 20% ostial stenosis which was patent which  was moderate size. OM3 was patent at prior PTCA and stented site. RCA has 20-30% proximal   . Hypertension   . Atrial arrhythmia   . Type II diabetes mellitus ([place])   . Cellulitis of knee, right 06/06/12  . Chronic otitis externa 06/06/12    C Multiple trials with antibiocs and antifungal. Cultures were positive for Pseudomonas. Follow up with ENT on 06/14/12    . NSTEMI (non-ST elevated myocardial infarction) ([place]) 08/14/2012    S/p Successful PTCA to proximal OM-3      Current Outpatient Prescriptions  Medication Sig Dispense Refill  . acetaminophen (TYLENOL) 500 MG tablet Take 1  tablet (500 mg total) by mouth every 8 (eight) hours as needed for fever. 30 tablet 0  . aspirin EC 81 MG tablet Take 1 tablet (81 mg total) by mouth daily. 90 tablet 3  . gabapentin (NEURONTIN) 300 MG capsule Take 1 capsule (300 mg total) by mouth 3 (three) times daily. 90 capsule 2  . glipiZIDE (GLUCOTROL) 5 MG tablet Take 1 tablet (5 mg total) by mouth daily. 90 tablet 3  . isosorbide mononitrate (IMDUR) 30 MG 24 hr tablet TAKE ONE TABLET BY MOUTH ONCE DAILY 90 tablet 3  . metFORMIN (GLUCOPHAGE) 1000 MG tablet TAKE ONE TABLET BY MOUTH TWICE DAILY WITH MEALS 180 tablet 3  . NITROSTAT 0.4 MG SL tablet DISSOLVE ONE TABLET UNDER THE TONGUE EVERY 5 MINUTES AS NEEDED FOR CHEST PAIN.  DO NOT EXCEED A TOTAL OF 3 DOSES IN 15 MINUTES 30 tablet 0  . polyvinyl alcohol (LIQUIFILM TEARS) 1.4 % ophthalmic solution Place 1 drop into both eyes as needed for dry eyes. 15 mL 0  . pravastatin (PRAVACHOL) 40 MG tablet Take 1 tablet (40 mg total) by mouth daily. 90 tablet 3  . selenium sulfide (SELSUN) 2.5 % shampoo     . sotalol (BETAPACE) 80 MG tablet TAKE ONE-HALF TABLET BY MOUTH TWICE DAILY 180 tablet 0  . spironolactone (ALDACTONE) 25 MG tablet     . triamcinolone lotion (KENALOG) 0.1 % Apply topically  2 (two) times daily. 60 mL 1  . VOLTAREN 1 % GEL APPPLY TWO GRAMS TOPICALLY FOUR TIMES DAILY TO AREAS OF PAIN IN THE BACK 2 Tube 2  . XARELTO 20 MG TABS tablet Take 20 mg by mouth daily.      No current facility-administered medications for this visit.    Review of Systems: General: Denies fever, chills, diaphoresis, appetite change and fatigue.  Respiratory: Denies SOB, DOE, cough, and wheezing.   Cardiovascular: Denies chest pain and palpitations.  Gastrointestinal: Denies nausea, vomiting, abdominal pain, and diarrhea.  Genitourinary: Denies dysuria, increased frequency, and flank pain. Endocrine: Denies hot or cold intolerance, polyuria, and polydipsia. Musculoskeletal: Denies myalgias, back pain,  joint swelling, arthralgias and gait problem.  Skin: Denies pallor, rash and wounds.  Neurological: Denies dizziness, seizures, syncope, weakness, lightheadedness, numbness and headaches.  Psychiatric/Behavioral: Denies mood changes, and sleep disturbances.  Objective:   Physical Exam: Filed Vitals:   01/05/16 0926  BP: 151/80  Pulse: 72  Temp: 98.2 F (36.8 C)  TempSrc: Oral  Height: 6\' 3"  (1.905 m)  Weight: 283 lb 14.4 oz (128.776 kg)  SpO2: 100%    General: Alert, cooperative, NAD. HEENT: PERRL, EOMI. Moist mucus membranes. Left eye with conjunctival irritation/erythema. Watering of the eye. No foreign body or ulceration noted.  Neck: Full range of motion without pain, supple, no lymphadenopathy or carotid bruits Lungs: Clear to ascultation bilaterally, normal work of respiration, no wheezes, rales, rhonchi Heart: RRR, no murmurs, gallops, or rubs Abdomen: Soft, non-tender, non-distended, BS + Extremities: No cyanosis, clubbing, or edema Neurologic: Alert & oriented X3, cranial nerves II-XII intact, strength grossly intact, sensation intact to light touch   Assessment & Plan:   Please see problem based assessment and plan.

## 2016-01-05 NOTE — Progress Notes (Signed)
Internal Medicine Clinic Attending  I saw and evaluated the patient.  I personally confirmed the key portions of the history and exam documented by Dr. Jones and I reviewed pertinent patient test results.  The assessment, diagnosis, and plan were formulated together and I agree with the documentation in the resident's note.     

## 2016-01-26 ENCOUNTER — Ambulatory Visit (HOSPITAL_BASED_OUTPATIENT_CLINIC_OR_DEPARTMENT_OTHER): Payer: BLUE CROSS/BLUE SHIELD | Attending: Physical Medicine and Rehabilitation

## 2016-01-26 VITALS — Ht 75.0 in | Wt 275.0 lb

## 2016-01-26 DIAGNOSIS — R0683 Snoring: Secondary | ICD-10-CM | POA: Insufficient documentation

## 2016-01-26 DIAGNOSIS — G4733 Obstructive sleep apnea (adult) (pediatric): Secondary | ICD-10-CM | POA: Insufficient documentation

## 2016-01-27 ENCOUNTER — Other Ambulatory Visit: Payer: Self-pay | Admitting: Internal Medicine

## 2016-01-28 ENCOUNTER — Telehealth: Payer: Self-pay | Admitting: Internal Medicine

## 2016-01-28 NOTE — Telephone Encounter (Signed)
Just calling to let dr know that pt is enrolled in Case management for diabetic hypertension support program with [facility]. Wanted to give contact information incase dr has any questions.

## 2016-01-31 DIAGNOSIS — G4733 Obstructive sleep apnea (adult) (pediatric): Secondary | ICD-10-CM | POA: Diagnosis not present

## 2016-01-31 NOTE — Progress Notes (Signed)
  Patient Name: [name], [name] Date: 01/26/2016 Gender: Male D.O.B: 07-Sep-1956 Age (years): 63 Referring Provider: Margaretha Sheffield Height (inches): 75 Interpreting Physician: Baird Lyons MD, ABSM Weight (lbs): 275 RPSGT: Joni Reining BMI: 34 MRN: [id number] Neck Size: 16.00 CLINICAL INFORMATION Sleep Study Type: NPSG   Indication for sleep study: OSA   Epworth Sleepiness Score: 0/24   SLEEP STUDY TECHNIQUE As per the AASM Manual for the Scoring of Sleep and Associated Events v2.3 (April 2016) with a hypopnea requiring 4% desaturations. The channels recorded and monitored were frontal, central and occipital EEG, electrooculogram (EOG), submentalis EMG (chin), nasal and oral airflow, thoracic and abdominal wall motion, anterior tibialis EMG, snore microphone, electrocardiogram, and pulse oximetry.  MEDICATIONS Patient's medications include: charted for review. Medications self-administered by patient during sleep study : No sleep medicine administered.  SLEEP ARCHITECTURE The study was initiated at 10:04:44 PM and ended at 4:32:06 AM. Sleep onset time was 28.1 minutes and the sleep efficiency was 73.6%. The total sleep time was 285.0 minutes. Stage REM latency was 99.5 minutes. The patient spent 2.98% of the night in stage N1 sleep, 75.96% in stage N2 sleep, 0.18% in stage N3 and 20.88% in REM. Alpha intrusion was absent. Supine sleep was 58.60%. Wake after sleep onset 74 minutes  RESPIRATORY PARAMETERS The overall apnea/hypopnea index (AHI) was 2.7 per hour. There were 1 total apneas, including 1 obstructive, 0 central and 0 mixed apneas. There were 12 hypopneas and 6 RERAs. The AHI during Stage REM sleep was 9.1 per hour. AHI while supine was 3.2 per hour. The mean oxygen saturation was 92.13%. The minimum SpO2 during sleep was 88.00%. Moderate snoring was noted during this study.  CARDIAC DATA The 2 lead EKG demonstrated sinus rhythm. The mean heart rate was  66.46 beats per minute. Other EKG findings include: None.  LEG MOVEMENT DATA The total PLMS were 0 with a resulting PLMS index of 0.00. Associated arousal with leg movement index was 0.0 .  IMPRESSIONS - No significant obstructive sleep apnea occurred during this study (AHI = 2.7/h). - No significant central sleep apnea occurred during this study (CAI = 0.0/h). - The patient had minimal or no oxygen desaturation during the study (Min O2 = 88.00%) - The patient snored with Moderate snoring volume. - No cardiac abnormalities were noted during this study. - Clinically significant periodic limb movements did not occur during sleep. No significant associated arousals.  DIAGNOSIS - Primary Snoring (786.09 [R06.83 ICD-10]) - Normal study  RECOMMENDATIONS - Avoid alcohol, sedatives and other CNS depressants that may worsen sleep apnea and disrupt normal sleep architecture. - Sleep hygiene should be reviewed to assess factors that may improve sleep quality. - Weight management and regular exercise should be initiated or continued if appropriate.  Deneise Lever Diplomate, American Board of Sleep Medicine  ELECTRONICALLY SIGNED ON:  01/31/2016, 11:01 AM [place] PH: [phone]   FX: [phone] [place]

## 2016-02-23 ENCOUNTER — Other Ambulatory Visit: Payer: Self-pay | Admitting: Internal Medicine

## 2016-02-24 NOTE — Telephone Encounter (Signed)
Last seen 01/05/16 ; no f/u appt scheduled.

## 2016-02-24 NOTE — Telephone Encounter (Signed)
Last seen 01/05/16 by Dr Ronnald Ramp. No f/u appt scheduled.

## 2016-03-26 ENCOUNTER — Other Ambulatory Visit: Payer: Self-pay | Admitting: Internal Medicine

## 2016-03-30 ENCOUNTER — Other Ambulatory Visit: Payer: Self-pay | Admitting: Internal Medicine

## 2016-05-04 ENCOUNTER — Ambulatory Visit (INDEPENDENT_AMBULATORY_CARE_PROVIDER_SITE_OTHER): Payer: BLUE CROSS/BLUE SHIELD | Admitting: Internal Medicine

## 2016-05-04 ENCOUNTER — Encounter: Payer: Self-pay | Admitting: Internal Medicine

## 2016-05-04 VITALS — BP 123/66 | HR 75 | Temp 98.1°F | Ht 75.0 in | Wt 267.6 lb

## 2016-05-04 DIAGNOSIS — M47817 Spondylosis without myelopathy or radiculopathy, lumbosacral region: Secondary | ICD-10-CM | POA: Diagnosis not present

## 2016-05-04 DIAGNOSIS — Z7984 Long term (current) use of oral hypoglycemic drugs: Secondary | ICD-10-CM | POA: Diagnosis not present

## 2016-05-04 DIAGNOSIS — E119 Type 2 diabetes mellitus without complications: Secondary | ICD-10-CM | POA: Diagnosis not present

## 2016-05-04 LAB — GLUCOSE, CAPILLARY: Glucose-Capillary: 187 mg/dL — ABNORMAL HIGH (ref 65–99)

## 2016-05-04 LAB — POCT GLYCOSYLATED HEMOGLOBIN (HGB A1C): HEMOGLOBIN A1C: 7.6

## 2016-05-04 MED ORDER — GABAPENTIN 300 MG PO CAPS: 300.0000 mg | ORAL_CAPSULE | Freq: Three times a day (TID) | ORAL | Status: DC

## 2016-05-04 MED ORDER — GLIPIZIDE 10 MG PO TABS: 10.0000 mg | ORAL_TABLET | Freq: Every day | ORAL | Status: DC

## 2016-05-04 MED ORDER — VOLTAREN 1 % TD GEL: 2.0000 g | Freq: Four times a day (QID) | TRANSDERMAL | Status: DC

## 2016-05-04 NOTE — Progress Notes (Signed)
Patient ID: [name], male   DOB: 1955/12/10, 59 y.o.   MRN: [id number]   Subjective:   Patient ID: [name] male   DOB: March 28, 1956 60 y.o.   MRN: [id number]  HPI: [name] is a 60 y.o. male with PMH as below, here for f/u DMII and back pain.  Please see Problem-Based charting for the status of the patient's chronic medical issues.     Past Medical History  Diagnosis Date  . TOBACCO ABUSE 11/25/2006  . CORONARY ARTERY DISEASE 11/25/2006    Cardiac cath by Dr Terrence Dupont 11/2003 LV showed good LV systolic function, EF of 0000000. Left main was patent. LAD has 20-30% mid stenosis. Diagonal 1 and diagonal 2 were patent. Left circumflex was patent. OM1 was less than 0.5 mm which was diffusely diseased. OM2 has 20% ostial stenosis which was patent which  was moderate size. OM3 was patent at prior PTCA and stented site. RCA has 20-30% proximal   . Hypertension   . Atrial arrhythmia   . Type II diabetes mellitus ([place])   . Cellulitis of knee, right 06/06/12  . Chronic otitis externa 06/06/12    C Multiple trials with antibiocs and antifungal. Cultures were positive for Pseudomonas. Follow up with ENT on 06/14/12    . NSTEMI (non-ST elevated myocardial infarction) ([place]) 08/14/2012    S/p Successful PTCA to proximal OM-3      Current Outpatient Prescriptions  Medication Sig Dispense Refill  . acetaminophen (TYLENOL) 500 MG tablet Take 1 tablet (500 mg total) by mouth every 8 (eight) hours as needed for fever. 30 tablet 0  . aspirin EC 81 MG tablet Take 1 tablet (81 mg total) by mouth daily. 90 tablet 3  . erythromycin (ROMYCIN) ophthalmic ointment Place 1 application into the left eye 3 (three) times daily. Use for 5 days then stop 3.5 g 0  . gabapentin (NEURONTIN) 300 MG capsule Take 1 capsule (300 mg total) by mouth 3 (three) times daily. 90 capsule 3  . glipiZIDE (GLUCOTROL) 10 MG tablet Take 1 tablet (10 mg total) by mouth daily. 30 tablet 3  . isosorbide mononitrate (IMDUR) 30 MG 24 hr  tablet TAKE ONE TABLET BY MOUTH ONCE DAILY 90 tablet 3  . ketorolac (ACULAR) 0.4 % SOLN Place 1 drop into the left eye 4 (four) times daily. 5 mL 0  . metFORMIN (GLUCOPHAGE) 1000 MG tablet TAKE ONE TABLET BY MOUTH TWICE DAILY WITH  MEALS 180 tablet 0  . NITROSTAT 0.4 MG SL tablet DISSOLVE ONE TABLET UNDER THE TONGUE EVERY 5 MINUTES AS NEEDED FOR CHEST PAIN.  DO NOT EXCEED A TOTAL OF 3 DOSES IN 15 MINUTES 30 tablet 0  . polyvinyl alcohol (LIQUIFILM TEARS) 1.4 % ophthalmic solution Place 1 drop into both eyes as needed for dry eyes. 15 mL 0  . pravastatin (PRAVACHOL) 40 MG tablet Take 1 tablet (40 mg total) by mouth daily. 90 tablet 3  . selenium sulfide (SELSUN) 2.5 % shampoo     . sotalol (BETAPACE) 80 MG tablet TAKE ONE-HALF TABLET BY MOUTH TWICE DAILY 180 tablet 0  . spironolactone (ALDACTONE) 25 MG tablet     . triamcinolone lotion (KENALOG) 0.1 % Apply topically 2 (two) times daily. 60 mL 1  . VOLTAREN 1 % GEL Apply 2 g topically 4 (four) times daily. 100 g 3  . XARELTO 20 MG TABS tablet Take 20 mg by mouth daily.      No current facility-administered medications for this  visit.   No family history on file. Social History   Social History  . Marital Status: Married    Spouse Name: N/A  . Number of Children: N/A  . Years of Education: N/A   Social History Main Topics  . Smoking status: Former Smoker -- 0.20 packs/day for 12 years    Types: Cigarettes    Quit date: 11/29/2014  . Smokeless tobacco: Never Used     Comment: quit about 33mths ago  . Alcohol Use: No  . Drug Use: No  . Sexual Activity: Not Asked   Other Topics Concern  . None   Social History Narrative   Review of Systems: No fevers, chills, bowel or bladder incontinence, or IVDU. Objective:  Physical Exam: Filed Vitals:   05/04/16 1518  BP: 123/66  Pulse: 75  Temp: 98.1 F (36.7 C)  TempSrc: Oral  Height: 6\' 3"  (1.905 m)  Weight: 267 lb 9.6 oz (121.383 kg)  SpO2: 100%   Physical Exam    Constitutional: He is oriented to person, place, and time and well-developed, well-nourished, and in no distress. No distress.  HENT:  Head: Normocephalic and atraumatic.  Eyes: EOM are normal. No scleral icterus.  Neck: No tracheal deviation present.  Cardiovascular: Normal rate.   Rhythm apparently regular.  Pulmonary/Chest: Effort normal and breath sounds normal. No stridor. No respiratory distress. He has no wheezes. He has no rales.  Abdominal: Soft. He exhibits no distension. There is no tenderness.  Musculoskeletal:  Lancinating pains down lateral leg past knee reproduced with straight leg raise bilaterally. No spinal point tenderness.  TTP over left lateral hip/PSIS.  Neurological: He is oriented to person, place, and time.  Skin: Skin is warm and dry. He is not diaphoretic.     Assessment & Plan:   Patient and case were discussed with Dr. Evette Doffing.  Please refer to Problem Based charting for further documentation.

## 2016-05-04 NOTE — Assessment & Plan Note (Signed)
Lab Results  Component Value Date   HGBA1C 7.6 05/04/2016   HGBA1C 6.8 10/31/2015   HGBA1C 6.5 07/10/2015     Assessment: Diabetes control:  fair Progress toward A1C goal:    Comments: Patient upset at decline in A1c. He denies low blood sugars, but does not check his sugars.  He would like to go back up on Glipizide for better control  Plan: Medications:  Continue Metformin 1000 mg BID. Increase Glipizide to 10 mg daily Home glucose monitoring: Frequency:   Timing:   Instruction/counseling given: reminded to bring medications to each visit Educational resources provided:   Self management tools provided:   Other plans: RTC 3 months to recheck A1c and assess symptoms of hypoglycemia

## 2016-05-04 NOTE — Patient Instructions (Signed)
1. INCREASE Gabapentin to 300 mg THREE times daily. 2. INCREASE Glipizide to 10 mg daily. 3. Apply Voltaren gel to low back as needed for pain relief. 4. Return to clinic in 3 months.  Hypoglycemia Hypoglycemia occurs when the glucose in your blood is too low. Glucose is a type of sugar that is your body's main energy source. Hormones, such as insulin and glucagon, control the level of glucose in the blood. Insulin lowers blood glucose and glucagon increases blood glucose. Having too much insulin in your blood stream, or not eating enough food containing sugar, can result in hypoglycemia. Hypoglycemia can happen to people with or without diabetes. It can develop quickly and can be a medical emergency.  CAUSES   Missing or delaying meals.  Not eating enough carbohydrates at meals.  Taking too much diabetes medicine.  Not timing your oral diabetes medicine or insulin doses with meals, snacks, and exercise.  Nausea and vomiting.  Certain medicines.  Severe illnesses, such as hepatitis, kidney disorders, and certain eating disorders.  Increased activity or exercise without eating something extra or adjusting medicines.  Drinking too much alcohol.  A nerve disorder that affects body functions like your heart rate, blood pressure, and digestion (autonomic neuropathy).  A condition where the stomach muscles do not function properly (gastroparesis). Therefore, medicines and food may not absorb properly.  Rarely, a tumor of the pancreas can produce too much insulin. SYMPTOMS   Hunger.  Sweating (diaphoresis).  Change in body temperature.  Shakiness.  Headache.  Anxiety.  Lightheadedness.  Irritability.  Difficulty concentrating.  Dry mouth.  Tingling or numbness in the hands or feet.  Restless sleep or sleep disturbances.  Altered speech and coordination.  Change in mental status.  Seizures or prolonged convulsions.  Combativeness.  Drowsiness  (lethargic).  Weakness.  Increased heart rate or palpitations.  Confusion.  Pale, gray skin color.  Blurred or double vision.  Fainting. DIAGNOSIS  A physical exam and medical history will be performed. Your caregiver may make a diagnosis based on your symptoms. Blood tests and other lab tests may be performed to confirm a diagnosis. Once the diagnosis is made, your caregiver will see if your signs and symptoms go away once your blood glucose is raised.  TREATMENT  Usually, you can easily treat your hypoglycemia when you notice symptoms.  Check your blood glucose. If it is less than 70 mg/dl, take one of the following:   3-4 glucose tablets.    cup juice.    cup regular soda.   1 cup skim milk.   -1 tube of glucose gel.   5-6 hard candies.   Avoid high-fat drinks or food that may delay a rise in blood glucose levels.  Do not take more than the recommended amount of sugary foods, drinks, gel, or tablets. Doing so will cause your blood glucose to go too high.   Wait 10-15 minutes and recheck your blood glucose. If it is still less than 70 mg/dl or below your target range, repeat treatment.   Eat a snack if it is more than 1 hour until your next meal.  There may be a time when your blood glucose may go so low that you are unable to treat yourself at home when you start to notice symptoms. You may need someone to help you. You may even faint or be unable to swallow. If you cannot treat yourself, someone will need to bring you to the hospital.  [place]  If  you have diabetes, follow your diabetes management plan by:  Taking your medicines as directed.  Following your exercise plan.  Following your meal plan. Do not skip meals. Eat on time.  Testing your blood glucose regularly. Check your blood glucose before and after exercise. If you exercise longer or different than usual, be sure to check blood glucose more frequently.  Wearing your  medical alert jewelry that says you have diabetes.  Identify the cause of your hypoglycemia. Then, develop ways to prevent the recurrence of hypoglycemia.  Do not take a hot bath or shower right after an insulin shot.  Always carry treatment with you. Glucose tablets are the easiest to carry.  If you are going to drink alcohol, drink it only with meals.  Tell friends or family members ways to keep you safe during a seizure. This may include removing hard or sharp objects from the area or turning you on your side.  Maintain a healthy weight. SEEK MEDICAL CARE IF:   You are having problems keeping your blood glucose in your target range.  You are having frequent episodes of hypoglycemia.  You feel you might be having side effects from your medicines.  You are not sure why your blood glucose is dropping so low.  You notice a change in vision or a new problem with your vision. SEEK IMMEDIATE MEDICAL CARE IF:   Confusion develops.  A change in mental status occurs.  The inability to swallow develops.  Fainting occurs.   This information is not intended to replace advice given to you by your health care provider. Make sure you discuss any questions you have with your health care provider.   Document Released: 11/15/2005 Document Revised: 11/20/2013 Document Reviewed: 07/22/2015 Elsevier Interactive Patient Education 2016 Elsevier Inc.   Gabapentin capsules or tablets What is this medicine? GABAPENTIN (GA ba pen tin) is used to control partial seizures in adults with epilepsy. It is also used to treat certain types of nerve pain. This medicine may be used for other purposes; ask your health care provider or pharmacist if you have questions. What should I tell my health care provider before I take this medicine? They need to know if you have any of these conditions: -kidney disease -suicidal thoughts, plans, or attempt; a previous suicide attempt by you or a family member -an  unusual or allergic reaction to gabapentin, other medicines, foods, dyes, or preservatives -pregnant or trying to get pregnant -breast-feeding How should I use this medicine? Take this medicine by mouth with a glass of water. Follow the directions on the prescription label. You can take it with or without food. If it upsets your stomach, take it with food.Take your medicine at regular intervals. Do not take it more often than directed. Do not stop taking except on your doctor's advice. If you are directed to break the 600 or 800 mg tablets in half as part of your dose, the extra half tablet should be used for the next dose. If you have not used the extra half tablet within 28 days, it should be thrown away. A special MedGuide will be given to you by the pharmacist with each prescription and refill. Be sure to read this information carefully each time. Talk to your pediatrician regarding the use of this medicine in children. Special care may be needed. Overdosage: If you think you have taken too much of this medicine contact a poison control center or emergency room at once. NOTE: This medicine is only  for you. Do not share this medicine with others. What if I miss a dose? If you miss a dose, take it as soon as you can. If it is almost time for your next dose, take only that dose. Do not take double or extra doses. What may interact with this medicine? Do not take this medicine with any of the following medications: -other gabapentin products This medicine may also interact with the following medications: -alcohol -antacids -antihistamines for allergy, cough and cold -certain medicines for anxiety or sleep -certain medicines for depression or psychotic disturbances -homatropine; hydrocodone -naproxen -narcotic medicines (opiates) for pain -phenothiazines like chlorpromazine, mesoridazine, prochlorperazine, thioridazine This list may not describe all possible interactions. Give your health  care provider a list of all the medicines, herbs, non-prescription drugs, or dietary supplements you use. Also tell them if you smoke, drink alcohol, or use illegal drugs. Some items may interact with your medicine. What should I watch for while using this medicine? Visit your doctor or health care professional for regular checks on your progress. You may want to keep a record at home of how you feel your condition is responding to treatment. You may want to share this information with your doctor or health care professional at each visit. You should contact your doctor or health care professional if your seizures get worse or if you have any new types of seizures. Do not stop taking this medicine or any of your seizure medicines unless instructed by your doctor or health care professional. Stopping your medicine suddenly can increase your seizures or their severity. Wear a medical identification bracelet or chain if you are taking this medicine for seizures, and carry a card that lists all your medications. You may get drowsy, dizzy, or have blurred vision. Do not drive, use machinery, or do anything that needs mental alertness until you know how this medicine affects you. To reduce dizzy or fainting spells, do not sit or stand up quickly, especially if you are an older patient. Alcohol can increase drowsiness and dizziness. Avoid alcoholic drinks. Your mouth may get dry. Chewing sugarless gum or sucking hard candy, and drinking plenty of water will help. The use of this medicine may increase the chance of suicidal thoughts or actions. Pay special attention to how you are responding while on this medicine. Any worsening of mood, or thoughts of suicide or dying should be reported to your health care professional right away. Women who become pregnant while using this medicine may enroll in the [place] Pregnancy Registry by calling [phone]. This registry collects information  about the safety of antiepileptic drug use during pregnancy. What side effects may I notice from receiving this medicine? Side effects that you should report to your doctor or health care professional as soon as possible: -allergic reactions like skin rash, itching or hives, swelling of the face, lips, or tongue -worsening of mood, thoughts or actions of suicide or dying Side effects that usually do not require medical attention (report to your doctor or health care professional if they continue or are bothersome): -constipation -difficulty walking or controlling muscle movements -dizziness -nausea -slurred speech -tiredness -tremors -weight gain This list may not describe all possible side effects. Call your doctor for medical advice about side effects. You may report side effects to FDA at 1-800-FDA-1088. Where should I keep my medicine? Keep out of reach of children. This medicine may cause accidental overdose and death if it taken by other adults, children, or pets. Mix  any unused medicine with a substance like cat litter or coffee grounds. Then throw the medicine away in a sealed container like a sealed bag or a coffee can with a lid. Do not use the medicine after the expiration date. Store at room temperature between 15 and 30 degrees C (59 and 86 degrees F). NOTE: This sheet is a summary. It may not cover all possible information. If you have questions about this medicine, talk to your doctor, pharmacist, or health care provider.    2016, Elsevier/Gold Standard. (2014-01-11 15:26:50)

## 2016-05-04 NOTE — Assessment & Plan Note (Signed)
Patient reports continued L>R low back pain, mildly relieved with Gabapentin 100 mg TID.  He would like the cream that he has previously been prescribed.  He has had previous relief with physical therapy, but does not recall how long ago he went.  He denies fevers, chills, point tenderness, IVDU, or h/o cancer.  A/P: Sciatica, stable.  No red flag symptoms.  Will refer back to PT, refill Voltaren gel, and titrate Gabapentin. - Gabapentin 300 mg TID - Voltaren gel - PT referral. - RTC 3 months to recheck back pain

## 2016-05-05 LAB — BMP8+ANION GAP
Anion Gap: 15 mmol/L (ref 10.0–18.0)
BUN/Creatinine Ratio: 15 (ref 9–20)
BUN: 12 mg/dL (ref 6–24)
CALCIUM: 9.1 mg/dL (ref 8.7–10.2)
CO2: 22 mmol/L (ref 18–29)
Chloride: 105 mmol/L (ref 96–106)
Creatinine, Ser: 0.8 mg/dL (ref 0.76–1.27)
GFR calc non Af Amer: 98 mL/min/{1.73_m2} (ref >59)
GFR, EST AFRICAN AMERICAN: 113 mL/min/{1.73_m2} (ref >59)
GLUCOSE: 193 mg/dL — AB (ref 65–99)
POTASSIUM: 4.2 mmol/L (ref 3.5–5.2)
Sodium: 142 mmol/L (ref 134–144)

## 2016-05-05 LAB — LIPID PANEL
CHOL/HDL RATIO: 5.6 ratio — AB (ref 0.0–5.0)
Cholesterol, Total: 192 mg/dL (ref 100–199)
HDL: 34 mg/dL — AB (ref >39)
LDL CALC: 121 mg/dL — AB (ref 0–99)
TRIGLYCERIDES: 186 mg/dL — AB (ref 0–149)
VLDL CHOLESTEROL CAL: 37 mg/dL (ref 5–40)

## 2016-05-05 LAB — HEPATIC FUNCTION PANEL (6)
ALT: 11 IU/L (ref 0–44)
AST: 11 IU/L (ref 0–40)
Albumin: 3.7 g/dL (ref 3.5–5.5)
Alkaline Phosphatase: 69 IU/L (ref 39–117)
Bilirubin Total: <0.2 mg/dL (ref 0.0–1.2)
Bilirubin, Direct: 0.05 mg/dL (ref 0.00–0.40)

## 2016-05-05 NOTE — Progress Notes (Signed)
Internal Medicine Clinic Attending  Case discussed with Dr. Taylor at the time of the visit.  We reviewed the resident's history and exam and pertinent patient test results.  I agree with the assessment, diagnosis, and plan of care documented in the resident's note. 

## 2016-05-13 ENCOUNTER — Other Ambulatory Visit: Payer: Self-pay | Admitting: Pharmacist

## 2016-05-13 ENCOUNTER — Telehealth: Payer: Self-pay

## 2016-05-13 DIAGNOSIS — I4891 Unspecified atrial fibrillation: Secondary | ICD-10-CM

## 2016-05-13 NOTE — Telephone Encounter (Signed)
[name] is a 60 y.o. male who was contacted via telephone for monitoring of rivaroxaban (Xarelto) therapy.  Patient is calling back left message with family member.   ASSESSMENT Indication(s): atrial fibrillation Duration: indefinite  Labs:    Component Value Date/Time   AST 11 05/04/2016 1521   ALT 11 05/04/2016 1521   NA 142 05/04/2016 1521   NA 141 04/09/2015 2156   K 4.2 05/04/2016 1521   CL 105 05/04/2016 1521   CO2 22 05/04/2016 1521   GLUCOSE 193* 05/04/2016 1521   GLUCOSE 189* 04/09/2015 2156   HGBA1C 7.6 05/04/2016 1527   HGBA1C 7.1* 08/14/2012 1508   BUN 12 05/04/2016 1521   BUN 17 04/09/2015 2156   CREATININE 0.80 05/04/2016 1521   CREATININE 0.65 05/10/2013 1443   CALCIUM 9.1 05/04/2016 1521   GFRNONAA 98 05/04/2016 1521   GFRNONAA >89 05/10/2013 1443   GFRAA 113 05/04/2016 1521   GFRAA >89 05/10/2013 1443   WBC 6.9 04/11/2015 0225   HGB 12.6* 04/11/2015 0225   HCT 39.0 04/11/2015 0225   PLT 138* 04/11/2015 0225    rivaroxaban (Xarelto) Dose: 20 mg daily  Safety: Patient has not had recent bleeding/thromboembolic events. Patient reports no recent signs or symptoms of bleeding, no signs of symptoms of thromboembolism. Medication changes: no.  Adherence: Patient reports no known adherence challenges. Patient does correctly recite the dose. Contacted pharmacy and records indicate refills are not consistent. Last fills (30 day supply) 4/28, 3/18. Patient stated he needs refills.  Patient Instructions: Patient advised to contact clinic or seek medical attention if signs/symptoms of bleeding or thromboembolism occur. Patient verbalized understanding by repeating back information.  Follow-up Next appointment not scheduled.   Angelena Form PharmD Candidate  05/13/2016, 10:58 AM

## 2016-05-14 ENCOUNTER — Encounter: Payer: Self-pay | Admitting: Internal Medicine

## 2016-05-19 ENCOUNTER — Encounter: Payer: Self-pay | Admitting: *Deleted

## 2016-05-19 MED ORDER — RIVAROXABAN 20 MG PO TABS: 20.0000 mg | ORAL_TABLET | Freq: Every day | ORAL | Status: DC

## 2016-05-31 ENCOUNTER — Ambulatory Visit: Payer: BLUE CROSS/BLUE SHIELD

## 2016-06-03 ENCOUNTER — Other Ambulatory Visit: Payer: Self-pay | Admitting: Internal Medicine

## 2016-06-03 ENCOUNTER — Ambulatory Visit: Payer: BLUE CROSS/BLUE SHIELD

## 2016-06-14 ENCOUNTER — Ambulatory Visit: Payer: BLUE CROSS/BLUE SHIELD | Attending: Internal Medicine | Admitting: Physical Therapy

## 2016-06-14 ENCOUNTER — Encounter: Payer: Self-pay | Admitting: Physical Therapy

## 2016-06-14 DIAGNOSIS — G8929 Other chronic pain: Secondary | ICD-10-CM | POA: Insufficient documentation

## 2016-06-14 DIAGNOSIS — M6281 Muscle weakness (generalized): Secondary | ICD-10-CM | POA: Diagnosis present

## 2016-06-14 DIAGNOSIS — M629 Disorder of muscle, unspecified: Secondary | ICD-10-CM | POA: Insufficient documentation

## 2016-06-14 DIAGNOSIS — M545 Low back pain: Secondary | ICD-10-CM | POA: Diagnosis not present

## 2016-06-14 DIAGNOSIS — R29898 Other symptoms and signs involving the musculoskeletal system: Secondary | ICD-10-CM | POA: Diagnosis present

## 2016-06-14 NOTE — Patient Instructions (Signed)
Access Code: [email or web address]  URL: [email or web address]  Date: 06/14/2016  Prepared by: Selinda Eon   Exercises  Supine Piriformis Stretch with Foot on Ground - 2 reps - 1 sets - 30s hold - 2x daily - 7x weekly  Hooklying Transversus Abdominis Palpation

## 2016-06-14 NOTE — Therapy (Signed)
[place], [place], [address] Phone: [phone]   Fax:  [phone]  Physical Therapy Evaluation  Patient Details  Name: [name] MRN: [id number] Date of Birth: [birth year] Referring Provider: Iline Oven, MD  Encounter Date: 06/14/2016      PT End of Session - 06/14/16 1723    Visit Number 1   Number of Visits 13   Date for PT Re-Evaluation 07/30/16   Authorization Type BCBS 30 visit limit   PT Start Time 1630   PT Stop Time 1707   PT Time Calculation (min) 37 min   Activity Tolerance Patient tolerated treatment well   Behavior During Therapy [facility] for tasks assessed/performed      Past Medical History  Diagnosis Date  . TOBACCO ABUSE 11/25/2006  . CORONARY ARTERY DISEASE 11/25/2006    Cardiac cath by Dr Terrence Dupont 11/2003 LV showed good LV systolic function, EF of 0000000. Left main was patent. LAD has 20-30% mid stenosis. Diagonal 1 and diagonal 2 were patent. Left circumflex was patent. OM1 was less than 0.5 mm which was diffusely diseased. OM2 has 20% ostial stenosis which was patent which  was moderate size. OM3 was patent at prior PTCA and stented site. RCA has 20-30% proximal   . Hypertension   . Atrial arrhythmia   . Type II diabetes mellitus ([place])   . Cellulitis of knee, right 06/06/12  . Chronic otitis externa 06/06/12    C Multiple trials with antibiocs and antifungal. Cultures were positive for Pseudomonas. Follow up with ENT on 06/14/12    . NSTEMI (non-ST elevated myocardial infarction) ([place]) 08/14/2012    S/p Successful PTCA to proximal OM-3       Past Surgical History  Procedure Laterality Date  . Coronary angioplasty with stent placement  ~ 2005    "1"  . Cystectomy  1990's    LUA  . Left heart catheterization with coronary angiogram N/A 08/15/2012    Procedure: LEFT HEART CATHETERIZATION WITH CORONARY ANGIOGRAM;  Surgeon: Clent Demark, MD;  Location: [facility] CATH LAB;  Service:  Cardiovascular;  Laterality: N/A;  . Percutaneous coronary stent intervention (pci-s)  08/15/2012    Procedure: PERCUTANEOUS CORONARY STENT INTERVENTION (PCI-S);  Surgeon: Clent Demark, MD;  Location: [facility] CATH LAB;  Service: Cardiovascular;;    There were no vitals filed for this visit.       Subjective Assessment - 06/14/16 1634    Subjective Pt works as a Curator. Pt reports feeling good for about a month after leaving PT last time. Radicular pain changes sides occasionally with pain radiating down antero-lateral thighs to feet. Back bothers him while he works but radicular pain comes on later. Scheduled for epidural tomorrow.    How long can you sit comfortably? unlimited   How long can you stand comfortably? 7 hr   How long can you walk comfortably? unlimited   Patient Stated Goals sleeping, work-ladders   Currently in Pain? Yes   Pain Score 6    Pain Location Back   Pain Orientation Right;Lower   Pain Descriptors / Indicators Aching   Pain Type Chronic pain   Pain Onset More than a month ago   Pain Frequency Constant   Aggravating Factors  walking (3hr+), laying supine, ladder   Pain Relieving Factors medications            [facility] PT Assessment - 06/14/16 0001    Assessment   Medical Diagnosis lumbosacral spondylosis without  myelopathy   Referring Provider Iline Oven, MD   Hand Dominance Right   Next MD Visit epidural tomorrow   Prior Therapy 2016   Precautions   Precautions None   Restrictions   Weight Bearing Restrictions No   Balance Screen   Has the patient fallen in the past 6 months No   [place] residence   Living Arrangements Spouse/significant other   [place] to enter   Entrance Stairs-Number of Steps 6   Prior Function   Level of Independence Independent   Vocation Full time employment  painter   Cognition   Overall Cognitive Status Within Functional Limits for tasks assessed    Observation/Other Assessments   Focus on Therapeutic Outcomes (FOTO)  41% disability   ROM / Strength   AROM / PROM / Strength AROM;Strength   AROM   Overall AROM Comments tends to stand with slight resting lumbar flexion   AROM Assessment Site Lumbar   Strength   Strength Assessment Site Hip   Right/Left Hip Right;Left   Right Hip Flexion 4/5   Right Hip Extension 3/5   Right Hip ABduction 4/5   Left Hip Flexion 4-/5   Left Hip Extension 3/5   Left Hip ABduction 3+/5   Palpation   Palpation comment concordant pain with palpation to R QL and lumbar paraspinals                   [facility] Adult PT Treatment/Exercise - 06/14/16 0001    Exercises   Exercises Lumbar;Knee/Hip   Lumbar Exercises: Supine   Ab Set 20 reps  heavy VC, discussed performing seated & standing   Knee/Hip Exercises: Stretches   Piriformis Stretch 30 seconds;Both   Manual Therapy   Manual therapy comments trigger point release R QL  instructed in use of tennis ball for home                PT Education - 06/14/16 1723    Education provided Yes   Education Details anatomy of condition, POC, HEP, tennis ball trigger point release   Person(s) Educated Patient   Methods Explanation;Demonstration;Tactile cues;Verbal cues;Handout   Comprehension Verbalized understanding;Returned demonstration;Verbal cues required;Tactile cues required;Need further instruction          PT Short Term Goals - 06/14/16 1733    PT SHORT TERM GOAL #1   Title Pt will be independent with HEP as it has been developed by 8/7   Time 3   Period Weeks   Status New   PT SHORT TERM GOAL #2   Title Pt will verbalize centralization of pain to low back   Time 3   Period Weeks   Status New           PT Long Term Goals - 06/14/16 1734    PT LONG TERM GOAL #1   Title Pt will be able to lay supine comfortably to go to sleep by 9/1   Baseline 6   Period Weeks   Status New   PT LONG TERM GOAL #2   Title Pt will  be able to climb ladder for work activities with minimal back discomfort   Time 6   Period Weeks   Status New   PT LONG TERM GOAL #3   Title Pt will verbalize ability to incorporate stretches into day to avoid excessive pain in the evenings   Time 6   Period Weeks   Status New  PT LONG TERM GOAL #4   Title Pt will verbalize average pain <=3/10 in low back to decrease effects of pain on daily activities   PT LONG TERM GOAL #5   Title FOTO to 65% ability to indicate significant functional improvement   Baseline 59% ability at evaluation   Time 6   Period Weeks   Status New               Plan - 06/14/16 1724    Clinical Impression Statement Pt presents to PT with complaints of LBP with raducular symptoms mostly down R leg but it can go to L. Pt works as a Curator and is on his feet for up to 10 hours a day without a break. Concordant pain recreated with palpation to QL and was reduced following manual treatment. Pt will benefit from skilled PT in order to strengthen core and pelvic region to stabilize trunk while performing work activities.    Rehab Potential Fair   Clinical Impairments Affecting Rehab Potential chronic pain and physical job   PT Frequency 2x / week   PT Duration 6 weeks   PT Treatment/Interventions ADLs/Self Care Home Management;Cryotherapy;Electrical Stimulation;Ultrasound;Traction;Moist Heat;Iontophoresis 4mg /ml Dexamethasone;Gait training;Functional mobility training;Therapeutic activities;Therapeutic exercise;Stair training;Balance training;Patient/family education;Neuromuscular re-education;Manual techniques;Taping;Passive range of motion;Dry needling   PT Next Visit Plan core strengthening, traction prn   PT Home Exercise Plan piriformis stretch, transverse abdominis bracing   Consulted and Agree with Plan of Care Patient      Patient will benefit from skilled therapeutic intervention in order to improve the following deficits and impairments:  Pain,  Improper body mechanics, Postural dysfunction, Increased muscle spasms, Decreased strength, Difficulty walking  Visit Diagnosis: Right low back pain, with sciatica presence unspecified - Plan: PT plan of care cert/re-cert  Muscle weakness (generalized) - Plan: PT plan of care cert/re-cert     Problem List Patient Active Problem List   Diagnosis Date Noted  . Sensation of foreign body in eye 01/05/2016  . Chronic anticoagulation (Xarelto) 12/26/2015  . Tooth infection 02/05/2015  . Right lumbar radiculitis 11/19/2014  . Insomnia 05/22/2014  . Healthcare maintenance 12/13/2013  . Greater trochanteric bursitis of left hip 05/16/2013  . Dysphagia, unspecified(787.20) 05/10/2013  . Lumbosacral spondylosis without myelopathy 04/18/2013  . Lumbar facet arthropathy 04/18/2013  . Sacroiliac joint dysfunction of left side 02/21/2013  . Leg length discrepancy 02/21/2013  . Seborrhea capitis 01/23/2013  . NSTEMI (non-ST elevated myocardial infarction) ([place]) 08/14/2012  . Tobacco abuse 08/08/2012  . Hypertension 09/25/2011  . GERD 04/08/2010  . Chronic radicular low back pain 07/20/2007  . Diabetes mellitus type 2, controlled ([place]) 11/25/2006  . HYPOGONADISM 11/25/2006  . Coronary atherosclerosis 11/25/2006  . Atrial fibrillation ([place]) 11/25/2006    Caron Tardif C. Stephone Gum PT, DPT 06/14/2016 5:41 PM   [facility] [address] [place], [place], [address] Phone: [phone]   Fax:  [phone]  Name: [name] MRN: [id number] Date of Birth: [birth year]

## 2016-06-16 ENCOUNTER — Ambulatory Visit: Payer: BLUE CROSS/BLUE SHIELD | Admitting: Physical Therapy

## 2016-06-16 DIAGNOSIS — M545 Low back pain: Secondary | ICD-10-CM | POA: Diagnosis not present

## 2016-06-16 DIAGNOSIS — M2560 Stiffness of unspecified joint, not elsewhere classified: Secondary | ICD-10-CM

## 2016-06-16 DIAGNOSIS — M6281 Muscle weakness (generalized): Secondary | ICD-10-CM

## 2016-06-16 DIAGNOSIS — M2569 Stiffness of other specified joint, not elsewhere classified: Secondary | ICD-10-CM

## 2016-06-16 NOTE — Patient Instructions (Addendum)
      Isometric Hold With Pelvic Floor (Hook-Lying)  Lie with hips and knees bent. Slowly inhale, and then exhale. Pull navel toward spine and tighten pelvic floor. Hold for __10_ seconds. Continue to breathe in and out during hold. Rest for _10__ seconds. Repeat __10_ times. Do __2-3_ times a day.   Knee Fold  Lie on back, legs bent, arms by sides. Exhale, lifting knee to chest. Inhale, returning. Keep abdominals flat, navel to spine. Repeat __10__ times, alternating legs. Do __2__ sessions per day.  Knee Drop  Keep pelvis stable. Without rotating hips, slowly drop knee to side, pause, return to center, bring knee across midline toward opposite hip. Feel obliques engaging. Repeat for ___10_ times each leg.   Copyright  VHI. All rights reserved.       Heel Slide to Straight   Slide one leg down to straight. Return. Be sure pelvis does not rock forward, tilt, rotate, or tip to side. Do _10__ times. Restabilize pelvis. Repeat with other leg. Do __1-2_ sets, __2_ times per day.  Bridge    Lie back, legs bent. Inhale, pressing hips up. Keeping ribs in, lengthen lower back. Exhale, rolling down along spine from top. Repeat _10___ times. Do __2__ sessions per day.  http://pm.exer.us/54   Copyright  VHI. All rights reserved.   Abduction: Side Leg Lift (Eccentric) - Side-Lying    Lie on side. Lift top leg slightly higher than shoulder level. Keep top leg straight with body, toes pointing forward. Slowly lower for 3-5 seconds. _10-20__ reps per set, __2_ sets per day, _7__ days per week.

## 2016-06-16 NOTE — Therapy (Signed)
[place], [place], [address] Phone: [phone]   Fax:  [phone]  Physical Therapy Treatment  Patient Details  Name: [name] MRN: [id number] Date of Birth: 1956/05/30 Referring Provider: Iline Oven, MD  Encounter Date: 06/16/2016      PT End of Session - 06/16/16 1336    Visit Number 2   Number of Visits 13   Date for PT Re-Evaluation 07/30/16   Authorization Type BCBS 30 visit limit   PT Start Time 0128   PT Stop Time 0215   PT Time Calculation (min) 47 min      Past Medical History  Diagnosis Date  . TOBACCO ABUSE 11/25/2006  . CORONARY ARTERY DISEASE 11/25/2006    Cardiac cath by Dr Terrence Dupont 11/2003 LV showed good LV systolic function, EF of 0000000. Left main was patent. LAD has 20-30% mid stenosis. Diagonal 1 and diagonal 2 were patent. Left circumflex was patent. OM1 was less than 0.5 mm which was diffusely diseased. OM2 has 20% ostial stenosis which was patent which  was moderate size. OM3 was patent at prior PTCA and stented site. RCA has 20-30% proximal   . Hypertension   . Atrial arrhythmia   . Type II diabetes mellitus ([place])   . Cellulitis of knee, right 06/06/12  . Chronic otitis externa 06/06/12    C Multiple trials with antibiocs and antifungal. Cultures were positive for Pseudomonas. Follow up with ENT on 06/14/12    . NSTEMI (non-ST elevated myocardial infarction) ([place]) 08/14/2012    S/p Successful PTCA to proximal OM-3       Past Surgical History  Procedure Laterality Date  . Coronary angioplasty with stent placement  ~ 2005    "1"  . Cystectomy  1990's    LUA  . Left heart catheterization with coronary angiogram N/A 08/15/2012    Procedure: LEFT HEART CATHETERIZATION WITH CORONARY ANGIOGRAM;  Surgeon: Clent Demark, MD;  Location: [facility] CATH LAB;  Service: Cardiovascular;  Laterality: N/A;  . Percutaneous coronary stent intervention (pci-s)  08/15/2012    Procedure:  PERCUTANEOUS CORONARY STENT INTERVENTION (PCI-S);  Surgeon: Clent Demark, MD;  Location: [facility] CATH LAB;  Service: Cardiovascular;;    There were no vitals filed for this visit.      Subjective Assessment - 06/16/16 1336    Currently in Pain? Yes   Pain Score 3    Pain Location Back   Pain Orientation Right;Lower   Pain Descriptors / Indicators Aching                         [facility] Adult PT Treatment/Exercise - 06/16/16 0001    Lumbar Exercises: Supine   Ab Set 20 reps   Clam 20 reps   Heel Slides 10 reps   Bent Knee Raise 20 reps   Bridge 10 reps   Knee/Hip Exercises: Stretches   Piriformis Stretch 3 reps;30 seconds   Knee/Hip Exercises: Aerobic   Nustep L 6 UE/LE x 5 minutes   Modalities   Modalities Traction   Traction   Type of Traction Lumbar   Min (lbs) 70   Max (lbs) 35   Hold Time 60   Rest Time 10   Time 15                PT Education - 06/16/16 1353    Education provided Yes   Education Details Prepilates HEP , bridge and side  lying abduction   Person(s) Educated Patient   Methods Explanation;Handout   Comprehension Verbalized understanding          PT Short Term Goals - 06/14/16 1733    PT SHORT TERM GOAL #1   Title Pt will be independent with HEP as it has been developed by 8/7   Time 3   Period Weeks   Status New   PT SHORT TERM GOAL #2   Title Pt will verbalize centralization of pain to low back   Time 3   Period Weeks   Status New           PT Long Term Goals - 06/14/16 1734    PT LONG TERM GOAL #1   Title Pt will be able to lay supine comfortably to go to sleep by 9/1   Baseline 6   Period Weeks   Status New   PT LONG TERM GOAL #2   Title Pt will be able to climb ladder for work activities with minimal back discomfort   Time 6   Period Weeks   Status New   PT LONG TERM GOAL #3   Title Pt will verbalize ability to incorporate stretches into day to avoid excessive pain in the evenings   Time 6    Period Weeks   Status New   PT LONG TERM GOAL #4   Title Pt will verbalize average pain <=3/10 in low back to decrease effects of pain on daily activities   PT LONG TERM GOAL #5   Title FOTO to 65% ability to indicate significant functional improvement   Baseline 59% ability at evaluation   Time 6   Period Weeks   Status New               Plan - 06/16/16 1410    Clinical Impression Statement Pt reports less pain today however has did not work much today. Reviewed his initial HEP with pt demonstrating independence. Instructed pt in core strengthening and updated HEP. No increased pain. Traction today per patient request.    PT Next Visit Plan core strengthening, traction prn; assess benefit of traction this visit.       Patient will benefit from skilled therapeutic intervention in order to improve the following deficits and impairments:  Pain, Improper body mechanics, Postural dysfunction, Increased muscle spasms, Decreased strength, Difficulty walking  Visit Diagnosis: Right low back pain, with sciatica presence unspecified  Muscle weakness (generalized)  Decreased range of motion of trunk and back     Problem List Patient Active Problem List   Diagnosis Date Noted  . Sensation of foreign body in eye 01/05/2016  . Chronic anticoagulation (Xarelto) 12/26/2015  . Tooth infection 02/05/2015  . Right lumbar radiculitis 11/19/2014  . Insomnia 05/22/2014  . Healthcare maintenance 12/13/2013  . Greater trochanteric bursitis of left hip 05/16/2013  . Dysphagia, unspecified(787.20) 05/10/2013  . Lumbosacral spondylosis without myelopathy 04/18/2013  . Lumbar facet arthropathy 04/18/2013  . Sacroiliac joint dysfunction of left side 02/21/2013  . Leg length discrepancy 02/21/2013  . Seborrhea capitis 01/23/2013  . NSTEMI (non-ST elevated myocardial infarction) ([place]) 08/14/2012  . Tobacco abuse 08/08/2012  . Hypertension 09/25/2011  . GERD 04/08/2010  . Chronic radicular  low back pain 07/20/2007  . Diabetes mellitus type 2, controlled ([place]) 11/25/2006  . HYPOGONADISM 11/25/2006  . Coronary atherosclerosis 11/25/2006  . Atrial fibrillation ([place]) 11/25/2006    Dorene Ar, PTA 06/16/2016, 2:15 PM  [facility] [phone]  [place], [place], [address] Phone: [phone]   Fax:  [phone]  Name: [name] MRN: [id number] Date of Birth: [birth year]

## 2016-06-22 ENCOUNTER — Ambulatory Visit: Payer: BLUE CROSS/BLUE SHIELD | Admitting: Physical Therapy

## 2016-06-24 ENCOUNTER — Ambulatory Visit: Payer: BLUE CROSS/BLUE SHIELD | Admitting: Physical Therapy

## 2016-06-24 DIAGNOSIS — M2569 Stiffness of other specified joint, not elsewhere classified: Secondary | ICD-10-CM

## 2016-06-24 DIAGNOSIS — M79604 Pain in right leg: Secondary | ICD-10-CM

## 2016-06-24 DIAGNOSIS — M545 Low back pain, unspecified: Secondary | ICD-10-CM

## 2016-06-24 DIAGNOSIS — M2560 Stiffness of unspecified joint, not elsewhere classified: Secondary | ICD-10-CM

## 2016-06-24 DIAGNOSIS — G8929 Other chronic pain: Secondary | ICD-10-CM

## 2016-06-24 DIAGNOSIS — M6281 Muscle weakness (generalized): Secondary | ICD-10-CM

## 2016-06-24 DIAGNOSIS — M629 Disorder of muscle, unspecified: Secondary | ICD-10-CM

## 2016-06-24 NOTE — Therapy (Signed)
[place], [place], [address] Phone: [phone]   Fax:  [phone]  Physical Therapy Treatment  Patient Details  Name: [name] MRN: [id number] Date of Birth: 05/24/1956 Referring Provider: Iline Oven, MD  Encounter Date: 06/24/2016      PT End of Session - 06/24/16 1637    Visit Number 3   Number of Visits 13   Date for PT Re-Evaluation 07/30/16   Authorization Type BCBS 30 visit limit   PT Start Time 0430   PT Stop Time 0500  pt request to leave early   PT Time Calculation (min) 30 min      Past Medical History:  Diagnosis Date  . Atrial arrhythmia   . Cellulitis of knee, right 06/06/12  . Chronic otitis externa 06/06/12   C Multiple trials with antibiocs and antifungal. Cultures were positive for Pseudomonas. Follow up with ENT on 06/14/12    . CORONARY ARTERY DISEASE 11/25/2006   Cardiac cath by Dr Terrence Dupont 11/2003 LV showed good LV systolic function, EF of 0000000. Left main was patent. LAD has 20-30% mid stenosis. Diagonal 1 and diagonal 2 were patent. Left circumflex was patent. OM1 was less than 0.5 mm which was diffusely diseased. OM2 has 20% ostial stenosis which was patent which  was moderate size. OM3 was patent at prior PTCA and stented site. RCA has 20-30% proximal   . Hypertension   . NSTEMI (non-ST elevated myocardial infarction) ([place]) 08/14/2012   S/p Successful PTCA to proximal OM-3     . TOBACCO ABUSE 11/25/2006  . Type II diabetes mellitus ([place])     Past Surgical History:  Procedure Laterality Date  . CORONARY ANGIOPLASTY WITH STENT PLACEMENT  ~ 2005   "1"  . CYSTECTOMY  1990's   LUA  . LEFT HEART CATHETERIZATION WITH CORONARY ANGIOGRAM N/A 08/15/2012   Procedure: LEFT HEART CATHETERIZATION WITH CORONARY ANGIOGRAM;  Surgeon: Clent Demark, MD;  Location: [place] CATH LAB;  Service: Cardiovascular;  Laterality: N/A;  . PERCUTANEOUS CORONARY STENT INTERVENTION (PCI-S)   08/15/2012   Procedure: PERCUTANEOUS CORONARY STENT INTERVENTION (PCI-S);  Surgeon: Clent Demark, MD;  Location: [facility] CATH LAB;  Service: Cardiovascular;;    There were no vitals filed for this visit.                       [place] Adult PT Treatment/Exercise - 06/24/16 0001      Lumbar Exercises: Supine   Clam 20 reps   Clam Limitations green band    Bent Knee Raise 10 reps  2 sets   Bent Knee Raise Limitations Table Top then down one at a time.    Bridge 10 reps   Straight Leg Raise 15 reps   Straight Leg Raises Limitations with ab set     Lumbar Exercises: Sidelying   Hip Abduction 10 reps  2 sets     Knee/Hip Exercises: Aerobic   Nustep L 6 UE/LE x 10 minutes                  PT Short Term Goals - 06/14/16 1733      PT SHORT TERM GOAL #1   Title Pt will be independent with HEP as it has been developed by 8/7   Time 3   Period Weeks   Status New     PT SHORT TERM GOAL #2   Title Pt will verbalize centralization of pain to low  back   Time 3   Period Weeks   Status New           PT Long Term Goals - 06/14/16 1734      PT LONG TERM GOAL #1   Title Pt will be able to lay supine comfortably to go to sleep by 9/1   Baseline 6   Period Weeks   Status New     PT LONG TERM GOAL #2   Title Pt will be able to climb ladder for work activities with minimal back discomfort   Time 6   Period Weeks   Status New     PT LONG TERM GOAL #3   Title Pt will verbalize ability to incorporate stretches into day to avoid excessive pain in the evenings   Time 6   Period Weeks   Status New     PT LONG TERM GOAL #4   Title Pt will verbalize average pain <=3/10 in low back to decrease effects of pain on daily activities     PT LONG TERM GOAL #5   Title FOTO to 65% ability to indicate significant functional improvement   Baseline 59% ability at evaluation   Time 6   Period Weeks   Status New               Plan - 06/24/16 1637     Clinical Impression Statement He reports no leg pain if he keeps moving. It starts once he gets still. Pt demonstrates good core contorl with HEP. Advanced to table top exercise with alternating toe tape with good control. Began side lying hip abduction strengthenin. Pt request to end treatment early due to his wife being sick.    PT Next Visit Plan core strengthening, traction prn;       Patient will benefit from skilled therapeutic intervention in order to improve the following deficits and impairments:  Pain, Improper body mechanics, Postural dysfunction, Increased muscle spasms, Decreased strength, Difficulty walking  Visit Diagnosis: Right low back pain, with sciatica presence unspecified  Muscle weakness (generalized)  Decreased range of motion of trunk and back  Low back pain of over 3 months duration  Hamstring tightness of both lower extremities  Low back pain radiating to both legs     Problem List Patient Active Problem List   Diagnosis Date Noted  . Sensation of foreign body in eye 01/05/2016  . Chronic anticoagulation (Xarelto) 12/26/2015  . Tooth infection 02/05/2015  . Right lumbar radiculitis 11/19/2014  . Insomnia 05/22/2014  . Healthcare maintenance 12/13/2013  . Greater trochanteric bursitis of left hip 05/16/2013  . Dysphagia, unspecified(787.20) 05/10/2013  . Lumbosacral spondylosis without myelopathy 04/18/2013  . Lumbar facet arthropathy 04/18/2013  . Sacroiliac joint dysfunction of left side 02/21/2013  . Leg length discrepancy 02/21/2013  . Seborrhea capitis 01/23/2013  . NSTEMI (non-ST elevated myocardial infarction) ([place]) 08/14/2012  . Tobacco abuse 08/08/2012  . Hypertension 09/25/2011  . GERD 04/08/2010  . Chronic radicular low back pain 07/20/2007  . Diabetes mellitus type 2, controlled ([place]) 11/25/2006  . HYPOGONADISM 11/25/2006  . Coronary atherosclerosis 11/25/2006  . Atrial fibrillation ([place]) 11/25/2006    Dorene Ar,  PTA 06/24/2016, 5:17 PM  [facility] [address] [place], [place], [address] Phone: [phone]   Fax:  [phone]  Name: ALVARO CURRIN MRN: [id number] Date of Birth: 1956-01-22

## 2016-06-29 ENCOUNTER — Ambulatory Visit: Payer: BLUE CROSS/BLUE SHIELD | Attending: Internal Medicine | Admitting: Physical Therapy

## 2016-06-29 DIAGNOSIS — R29898 Other symptoms and signs involving the musculoskeletal system: Secondary | ICD-10-CM | POA: Insufficient documentation

## 2016-06-29 DIAGNOSIS — M2560 Stiffness of unspecified joint, not elsewhere classified: Secondary | ICD-10-CM

## 2016-06-29 DIAGNOSIS — M545 Low back pain, unspecified: Secondary | ICD-10-CM

## 2016-06-29 DIAGNOSIS — M6281 Muscle weakness (generalized): Secondary | ICD-10-CM | POA: Diagnosis present

## 2016-06-29 DIAGNOSIS — G8929 Other chronic pain: Secondary | ICD-10-CM | POA: Insufficient documentation

## 2016-06-29 DIAGNOSIS — M629 Disorder of muscle, unspecified: Secondary | ICD-10-CM

## 2016-06-29 DIAGNOSIS — M79604 Pain in right leg: Secondary | ICD-10-CM

## 2016-06-29 DIAGNOSIS — M2569 Stiffness of other specified joint, not elsewhere classified: Secondary | ICD-10-CM

## 2016-06-29 NOTE — Therapy (Signed)
[place], [place], [address] Phone: [phone]   Fax:  [phone]  Physical Therapy Treatment  Patient Details  Name: [name] MRN: [id number] Date of Birth: [birth year] Referring Provider: Iline Oven, MD  Encounter Date: 06/29/2016      PT End of Session - 06/29/16 1631    Visit Number 4   Number of Visits 13   Date for PT Re-Evaluation 07/30/16   Authorization Type BCBS 30 visit limit   PT Start Time 0345   PT Stop Time 0429   PT Time Calculation (min) 44 min   Activity Tolerance Patient tolerated treatment well   Behavior During Therapy [facility] for tasks assessed/performed      Past Medical History:  Diagnosis Date  . Atrial arrhythmia   . Cellulitis of knee, right 06/06/12  . Chronic otitis externa 06/06/12   C Multiple trials with antibiocs and antifungal. Cultures were positive for Pseudomonas. Follow up with ENT on 06/14/12    . CORONARY ARTERY DISEASE 11/25/2006   Cardiac cath by Dr Terrence Dupont 11/2003 LV showed good LV systolic function, EF of 0000000. Left main was patent. LAD has 20-30% mid stenosis. Diagonal 1 and diagonal 2 were patent. Left circumflex was patent. OM1 was less than 0.5 mm which was diffusely diseased. OM2 has 20% ostial stenosis which was patent which  was moderate size. OM3 was patent at prior PTCA and stented site. RCA has 20-30% proximal   . Hypertension   . NSTEMI (non-ST elevated myocardial infarction) ([place]) 08/14/2012   S/p Successful PTCA to proximal OM-3     . TOBACCO ABUSE 11/25/2006  . Type II diabetes mellitus ([place])     Past Surgical History:  Procedure Laterality Date  . CORONARY ANGIOPLASTY WITH STENT PLACEMENT  ~ 2005   "1"  . CYSTECTOMY  1990's   LUA  . LEFT HEART CATHETERIZATION WITH CORONARY ANGIOGRAM N/A 08/15/2012   Procedure: LEFT HEART CATHETERIZATION WITH CORONARY ANGIOGRAM;  Surgeon: Clent Demark, MD;  Location: [place] CATH LAB;  Service:  Cardiovascular;  Laterality: N/A;  . PERCUTANEOUS CORONARY STENT INTERVENTION (PCI-S)  08/15/2012   Procedure: PERCUTANEOUS CORONARY STENT INTERVENTION (PCI-S);  Surgeon: Clent Demark, MD;  Location: [facility] CATH LAB;  Service: Cardiovascular;;    There were no vitals filed for this visit.      Subjective Assessment - 06/29/16 1550    Subjective I have no pain in my right leg now but the pain in the back is a 7/10.  I worked hard painting a door   Currently in Pain? Yes   Pain Score 7    Pain Location Back   Pain Orientation Right;Lower   Pain Descriptors / Indicators Aching   Pain Type Chronic pain   Pain Onset More than a month ago   Pain Frequency Constant                         [facility] Adult PT Treatment/Exercise - 06/29/16 1553      Lumbar Exercises: Supine   Clam 20 reps   Clam Limitations green band    Bent Knee Raise 10 reps  2 sets   Bent Knee Raise Limitations Table Top then down one at a time.    Bridge 10 reps   Straight Leg Raise 15 reps   Straight Leg Raises Limitations with ab set and 4 lb   Other Supine Lumbar Exercises supine hip extension supine  with green t band 20 x right and then left     Lumbar Exercises: Sidelying   Clam 20 reps;3 seconds  greeen t band   Clam Limitations right and then Left     Lumbar Exercises: Quadruped   Madcat/Old Horse 10 reps   Madcat/Old Horse Limitations 5/10 pain   Single Arm Raise 10 reps;3 seconds   Single Arm Raise Weights (lbs) VC to maintain Ab set   Straight Leg Raise 10 reps;3 seconds   Straight Leg Raises Limitations use of yardstick to decrease rotation of trunk     Knee/Hip Exercises: Stretches   Piriformis Stretch 3 reps;30 seconds   Piriformis Stretch Limitations also in sitting as well as supine     Traction   Type of Traction --   Min (lbs) --   Max (lbs) --   Hold Time --   Rest Time --   Time --     Manual Therapy   Manual Therapy Joint mobilization   Joint Mobilization Lumbar  PA Grade 3/4 and lateral upa for L4, L5 transverse process grade 3/4                PT Education - 06/29/16 1626    Education provided Yes   Education Details Pt given piriformis stretch and table top exericise added to HEP   Person(s) Educated Patient   Methods Explanation;Demonstration;Verbal cues;Handout   Comprehension Verbalized understanding;Returned demonstration          PT Short Term Goals - 06/29/16 1632      PT SHORT TERM GOAL #1   Title Pt will be independent with HEP as it has been developed by 8/7   Time 3   Period Weeks   Status On-going     PT SHORT TERM GOAL #2   Title Pt will verbalize centralization of pain to low back   Baseline Pt with no pain in right leg and just in back today   Time 3   Period Weeks   Status On-going           PT Long Term Goals - 06/14/16 1734      PT LONG TERM GOAL #1   Title Pt will be able to lay supine comfortably to go to sleep by 9/1   Baseline 6   Period Weeks   Status New     PT LONG TERM GOAL #2   Title Pt will be able to climb ladder for work activities with minimal back discomfort   Time 6   Period Weeks   Status New     PT LONG TERM GOAL #3   Title Pt will verbalize ability to incorporate stretches into day to avoid excessive pain in the evenings   Time 6   Period Weeks   Status New     PT LONG TERM GOAL #4   Title Pt will verbalize average pain <=3/10 in low back to decrease effects of pain on daily activities     PT LONG TERM GOAL #5   Title FOTO to 65% ability to indicate significant functional improvement   Baseline 59% ability at evaluation   Time 6   Period Weeks   Status New               Plan - 06/29/16 1633    Clinical Impression Statement Pt reports no leg pain today and that he loves the traction machine.  Pt initially at 7/10 and reduced to 3/10 after treatment and  declined the traction for today after exercise.  Pt demonstrating good core control but needs more work  with quadriped exericises with ab setting.  Pt given green t band and clamshell exericise sidelying. Pt was able to use 2 versions of piriformis stretch to decrease lower back pain.     Rehab Potential Fair   Clinical Impairments Affecting Rehab Potential chronic pain and physical job   PT Frequency 2x / week   PT Duration 6 weeks   PT Treatment/Interventions ADLs/Self Care Home Management;Cryotherapy;Electrical Stimulation;Ultrasound;Traction;Moist Heat;Iontophoresis 4mg /ml Dexamethasone;Gait training;Functional mobility training;Therapeutic activities;Therapeutic exercise;Stair training;Balance training;Patient/family education;Neuromuscular re-education;Manual techniques;Taping;Passive range of motion;Dry needling   PT Next Visit Plan core strengthening, traction prn;    PT Home Exercise Plan piriformis stretch, transverse abdominis bracing and table top   Consulted and Agree with Plan of Care Patient      Patient will benefit from skilled therapeutic intervention in order to improve the following deficits and impairments:  Pain, Improper body mechanics, Postural dysfunction, Increased muscle spasms, Decreased strength, Difficulty walking  Visit Diagnosis: Right low back pain, with sciatica presence unspecified  Muscle weakness (generalized)  Decreased range of motion of trunk and back  Low back pain of over 3 months duration  Hamstring tightness of both lower extremities  Low back pain radiating to both legs     Problem List Patient Active Problem List   Diagnosis Date Noted  . Sensation of foreign body in eye 01/05/2016  . Chronic anticoagulation (Xarelto) 12/26/2015  . Tooth infection 02/05/2015  . Right lumbar radiculitis 11/19/2014  . Insomnia 05/22/2014  . Healthcare maintenance 12/13/2013  . Greater trochanteric bursitis of left hip 05/16/2013  . Dysphagia, unspecified(787.20) 05/10/2013  . Lumbosacral spondylosis without myelopathy 04/18/2013  . Lumbar facet  arthropathy 04/18/2013  . Sacroiliac joint dysfunction of left side 02/21/2013  . Leg length discrepancy 02/21/2013  . Seborrhea capitis 01/23/2013  . NSTEMI (non-ST elevated myocardial infarction) ([place]) 08/14/2012  . Tobacco abuse 08/08/2012  . Hypertension 09/25/2011  . GERD 04/08/2010  . Chronic radicular low back pain 07/20/2007  . Diabetes mellitus type 2, controlled ([place]) 11/25/2006  . HYPOGONADISM 11/25/2006  . Coronary atherosclerosis 11/25/2006  . Atrial fibrillation ([place]) 11/25/2006    Voncille Lo, PT 06/29/16 4:39 PM Phone: [phone] Fax: [facility] [address] [place], [place], [address] Phone: [phone]   Fax:  [phone]  Name: MCLEAN JABBOUR MRN: [id number] Date of Birth: 27-Dec-1955

## 2016-06-29 NOTE — Patient Instructions (Signed)
Mr Fanta,, use this stretch at work when your back is twinging and pain down leg.. Remember to extend back as shown in clinic at counter and then rotate trunk to right and " close it down"  Hip Stretch  Put right ankle over left knee. Let right knee fall downward, but keep ankle in place. Feel the stretch in hip. May push down gently with hand to feel stretch. Hold ____ seconds while counting out loud. Repeat with other leg. Repeat _3___ times Hld for 30 to 60 seconds. Do _1-2___ sessions per day.   OR   Stretching: Piriformis (Supine)  Pull right knee toward opposite shoulder. Hold _30-60 ___ seconds. Relax. Repeat _3___ times per set. Do __1__ sets per session. Do _1-2___ sessions per day.  [place]   Toe Touch   Lie on back, legs folded to chest, arms by sides. Exhale, lowering leg to just touch toes to mat. Inhale, returning knee to chest. Keep abdominals flat, navel to spine. Repeat _10___ times, Do one leg at a time.  Keep 90 degree tabletop. Not like above but shown in clinic. Do _1-2___ sessions per day.  Voncille Lo, PT 06/29/16 4:26 PM Phone: [phone] Fax: [phone]

## 2016-07-01 ENCOUNTER — Ambulatory Visit: Payer: BLUE CROSS/BLUE SHIELD | Admitting: Physical Therapy

## 2016-07-02 NOTE — Telephone Encounter (Signed)
Patient was seen in clinic with Karlyn Agee, PharmD candidate. I agree with the assessment and plan of care documented. Recommend checking CBC.

## 2016-07-06 ENCOUNTER — Ambulatory Visit: Payer: BLUE CROSS/BLUE SHIELD | Admitting: Physical Therapy

## 2016-07-06 ENCOUNTER — Encounter: Payer: Self-pay | Admitting: Physical Therapy

## 2016-07-06 DIAGNOSIS — M545 Low back pain: Secondary | ICD-10-CM | POA: Diagnosis not present

## 2016-07-06 DIAGNOSIS — M6281 Muscle weakness (generalized): Secondary | ICD-10-CM

## 2016-07-06 NOTE — Therapy (Signed)
[place], [place], [address] Phone: [phone]   Fax:  [phone]  Physical Therapy Treatment  Patient Details  Name: WEIR CROFF MRN: [id number] Date of Birth: 1956-04-08 Referring Provider: Iline Oven, MD  Encounter Date: 07/06/2016      PT End of Session - 07/06/16 1541    Visit Number 5   Number of Visits 13   Date for PT Re-Evaluation 07/30/16   Authorization Type BCBS 30 visit limit   PT Start Time 1540   PT Stop Time 1627   PT Time Calculation (min) 47 min   Activity Tolerance Patient tolerated treatment well   Behavior During Therapy [facility] for tasks assessed/performed      Past Medical History:  Diagnosis Date  . Atrial arrhythmia   . Cellulitis of knee, right 06/06/12  . Chronic otitis externa 06/06/12   C Multiple trials with antibiocs and antifungal. Cultures were positive for Pseudomonas. Follow up with ENT on 06/14/12    . CORONARY ARTERY DISEASE 11/25/2006   Cardiac cath by Dr Terrence Dupont 11/2003 LV showed good LV systolic function, EF of 0000000. Left main was patent. LAD has 20-30% mid stenosis. Diagonal 1 and diagonal 2 were patent. Left circumflex was patent. OM1 was less than 0.5 mm which was diffusely diseased. OM2 has 20% ostial stenosis which was patent which  was moderate size. OM3 was patent at prior PTCA and stented site. RCA has 20-30% proximal   . Hypertension   . NSTEMI (non-ST elevated myocardial infarction) ([place]) 08/14/2012   S/p Successful PTCA to proximal OM-3     . TOBACCO ABUSE 11/25/2006  . Type II diabetes mellitus ([place])     Past Surgical History:  Procedure Laterality Date  . CORONARY ANGIOPLASTY WITH STENT PLACEMENT  ~ 2005   "1"  . CYSTECTOMY  1990's   LUA  . LEFT HEART CATHETERIZATION WITH CORONARY ANGIOGRAM N/A 08/15/2012   Procedure: LEFT HEART CATHETERIZATION WITH CORONARY ANGIOGRAM;  Surgeon: Clent Demark, MD;  Location: [place] CATH LAB;  Service:  Cardiovascular;  Laterality: N/A;  . PERCUTANEOUS CORONARY STENT INTERVENTION (PCI-S)  08/15/2012   Procedure: PERCUTANEOUS CORONARY STENT INTERVENTION (PCI-S);  Surgeon: Clent Demark, MD;  Location: [facility] CATH LAB;  Service: Cardiovascular;;    There were no vitals filed for this visit.      Subjective Assessment - 07/06/16 1541    Subjective Occasionally noted pain down R leg mostly in the morning upon getting out of bed.    Currently in Pain? Yes   Pain Score 5    Pain Location Back   Pain Orientation Lower;Right   Pain Descriptors / Indicators --  "it just hurts"   Aggravating Factors  standing from being in supine position                         [facility] Adult PT Treatment/Exercise - 07/06/16 0001      Lumbar Exercises: Standing   Other Standing Lumbar Exercises high kneeling ab set with 5lb diagonals bilat UE     Lumbar Exercises: Supine   Bent Knee Raise 10 reps   Bent Knee Raise Limitations x10 marches only, x10 table top holds   Other Supine Lumbar Exercises hooklying to side rolling  cues for tight core     Lumbar Exercises: Quadruped   Single Arm Raise 5 reps;Right;Left   Plank elbow to PB rollout planks     Knee/Hip Exercises:  Aerobic   Nustep LE only L6 5 min     Traction   Type of Traction Lumbar   Min (lbs) 100   Max (lbs) 30   Hold Time 60   Rest Time 10   Time 10                PT Education - 07/06/16 1545    Education provided Yes   Education Details exercise form/rationale, HEP   Person(s) Educated Patient   Methods Explanation;Demonstration;Tactile cues;Verbal cues   Comprehension Verbalized understanding;Returned demonstration;Verbal cues required;Tactile cues required;Need further instruction          PT Short Term Goals - 07/06/16 1556      PT SHORT TERM GOAL #1   Title Pt will be independent with HEP as it has been developed by 8/7   Status Achieved     PT SHORT TERM GOAL #2   Title Pt will verbalize  centralization of pain to low back   Status Achieved           PT Long Term Goals - 06/14/16 1734      PT LONG TERM GOAL #1   Title Pt will be able to lay supine comfortably to go to sleep by 9/1   Baseline 6   Period Weeks   Status New     PT LONG TERM GOAL #2   Title Pt will be able to climb ladder for work activities with minimal back discomfort   Time 6   Period Weeks   Status New     PT LONG TERM GOAL #3   Title Pt will verbalize ability to incorporate stretches into day to avoid excessive pain in the evenings   Time 6   Period Weeks   Status New     PT LONG TERM GOAL #4   Title Pt will verbalize average pain <=3/10 in low back to decrease effects of pain on daily activities     PT LONG TERM GOAL #5   Title FOTO to 65% ability to indicate significant functional improvement   Baseline 59% ability at evaluation   Time 6   Period Weeks   Status New               Plan - 07/06/16 1552    Clinical Impression Statement Pt demo improved core control but cont to have pain with supine to sit movement indicating need for further training for core control in dynamic movements and variable postures.    PT Next Visit Plan core strengthening, traction prn; bed mobility and core control in rolling.    PT Home Exercise Plan piriformis stretch, transverse abdominis bracing and table top   Consulted and Agree with Plan of Care Patient      Patient will benefit from skilled therapeutic intervention in order to improve the following deficits and impairments:     Visit Diagnosis: Right low back pain, with sciatica presence unspecified  Muscle weakness (generalized)     Problem List Patient Active Problem List   Diagnosis Date Noted  . Sensation of foreign body in eye 01/05/2016  . Chronic anticoagulation (Xarelto) 12/26/2015  . Tooth infection 02/05/2015  . Right lumbar radiculitis 11/19/2014  . Insomnia 05/22/2014  . Healthcare maintenance 12/13/2013  .  Greater trochanteric bursitis of left hip 05/16/2013  . Dysphagia, unspecified(787.20) 05/10/2013  . Lumbosacral spondylosis without myelopathy 04/18/2013  . Lumbar facet arthropathy 04/18/2013  . Sacroiliac joint dysfunction of left side 02/21/2013  . Leg  length discrepancy 02/21/2013  . Seborrhea capitis 01/23/2013  . NSTEMI (non-ST elevated myocardial infarction) ([place]) 08/14/2012  . Tobacco abuse 08/08/2012  . Hypertension 09/25/2011  . GERD 04/08/2010  . Chronic radicular low back pain 07/20/2007  . Diabetes mellitus type 2, controlled ([place]) 11/25/2006  . HYPOGONADISM 11/25/2006  . Coronary atherosclerosis 11/25/2006  . Atrial fibrillation ([place]) 11/25/2006    Stephan Nelis C. Jaylanie Boschee PT, DPT 07/06/16 4:28 PM   [place] [facility] [address] [place], [place], [address] Phone: [phone]   Fax:  [phone]  Name: KASHAN RIVETT MRN: [id number] Date of Birth: 1956/01/19

## 2016-07-07 ENCOUNTER — Ambulatory Visit: Payer: BLUE CROSS/BLUE SHIELD | Admitting: Physical Therapy

## 2016-07-07 ENCOUNTER — Encounter: Payer: Self-pay | Admitting: Physical Therapy

## 2016-07-07 DIAGNOSIS — M6281 Muscle weakness (generalized): Secondary | ICD-10-CM

## 2016-07-07 DIAGNOSIS — M545 Low back pain: Secondary | ICD-10-CM | POA: Diagnosis not present

## 2016-07-07 NOTE — Therapy (Signed)
[place], [place], [address] Phone: [phone]   Fax:  [phone]  Physical Therapy Treatment  Patient Details  Name: [name] MRN: [id number] Date of Birth: [birth year] Referring Provider: Iline Oven, MD  Encounter Date: 07/07/2016      PT End of Session - 07/07/16 1540    Visit Number 6   Number of Visits 13   Date for PT Re-Evaluation 07/30/16   Authorization Type BCBS 30 visit limit   PT Start Time 1545   PT Stop Time 1637   PT Time Calculation (min) 52 min   Activity Tolerance Patient tolerated treatment well   Behavior During Therapy [facility] for tasks assessed/performed      Past Medical History:  Diagnosis Date  . Atrial arrhythmia   . Cellulitis of knee, right 06/06/12  . Chronic otitis externa 06/06/12   C Multiple trials with antibiocs and antifungal. Cultures were positive for Pseudomonas. Follow up with ENT on 06/14/12    . CORONARY ARTERY DISEASE 11/25/2006   Cardiac cath by Dr Terrence Dupont 11/2003 LV showed good LV systolic function, EF of 0000000. Left main was patent. LAD has 20-30% mid stenosis. Diagonal 1 and diagonal 2 were patent. Left circumflex was patent. OM1 was less than 0.5 mm which was diffusely diseased. OM2 has 20% ostial stenosis which was patent which  was moderate size. OM3 was patent at prior PTCA and stented site. RCA has 20-30% proximal   . Hypertension   . NSTEMI (non-ST elevated myocardial infarction) ([place]) 08/14/2012   S/p Successful PTCA to proximal OM-3     . TOBACCO ABUSE 11/25/2006  . Type II diabetes mellitus ([place])     Past Surgical History:  Procedure Laterality Date  . CORONARY ANGIOPLASTY WITH STENT PLACEMENT  ~ 2005   "1"  . CYSTECTOMY  1990's   LUA  . LEFT HEART CATHETERIZATION WITH CORONARY ANGIOGRAM N/A 08/15/2012   Procedure: LEFT HEART CATHETERIZATION WITH CORONARY ANGIOGRAM;  Surgeon: Clent Demark, MD;  Location: [place] CATH LAB;  Service:  Cardiovascular;  Laterality: N/A;  . PERCUTANEOUS CORONARY STENT INTERVENTION (PCI-S)  08/15/2012   Procedure: PERCUTANEOUS CORONARY STENT INTERVENTION (PCI-S);  Surgeon: Clent Demark, MD;  Location: [facility] CATH LAB;  Service: Cardiovascular;;    There were no vitals filed for this visit.      Subjective Assessment - 07/07/16 1541    Subjective Pt reports he was able to get out of bed this morning without pain down his leg by using techniques for tight core. High pain in R low back that began about 3 hours ago. Finished a job around 9 this morning.  Feels that traction is very helpful to reduce concordant pain.    Currently in Pain? Yes   Pain Score 7    Pain Location Back   Pain Orientation Lower;Right   Pain Descriptors / Indicators --  "it just hurts"                         [facility] Adult PT Treatment/Exercise - 07/07/16 0001      Lumbar Exercises: Stretches   Single Knee to Chest Stretch 10 seconds  10 reps; RLE only   Prone Mid Back Stretch Limitations child pose     Lumbar Exercises: Supine   Bent Knee Raise 20 reps   Bent Knee Raise Limitations x10 marches only, x10 table top holds     Modalities   Modalities  Moist Heat     Moist Heat Therapy   Number Minutes Moist Heat 10 Minutes   Moist Heat Location Lumbar Spine     Traction   Type of Traction Lumbar   Min (lbs) 100   Max (lbs) 30   Hold Time 60   Rest Time 10   Time 15                PT Education - 07/07/16 1552    Education provided Yes   Education Details core to protect back   Person(s) Educated Patient   Methods Explanation;Demonstration;Tactile cues;Verbal cues   Comprehension Verbalized understanding;Returned demonstration;Verbal cues required;Tactile cues required;Need further instruction          PT Short Term Goals - 07/06/16 1556      PT SHORT TERM GOAL #1   Title Pt will be independent with HEP as it has been developed by 8/7   Status Achieved     PT SHORT TERM  GOAL #2   Title Pt will verbalize centralization of pain to low back   Status Achieved           PT Long Term Goals - 06/14/16 1734      PT LONG TERM GOAL #1   Title Pt will be able to lay supine comfortably to go to sleep by 9/1   Baseline 6   Period Weeks   Status New     PT LONG TERM GOAL #2   Title Pt will be able to climb ladder for work activities with minimal back discomfort   Time 6   Period Weeks   Status New     PT LONG TERM GOAL #3   Title Pt will verbalize ability to incorporate stretches into day to avoid excessive pain in the evenings   Time 6   Period Weeks   Status New     PT LONG TERM GOAL #4   Title Pt will verbalize average pain <=3/10 in low back to decrease effects of pain on daily activities     PT LONG TERM GOAL #5   Title FOTO to 65% ability to indicate significant functional improvement   Baseline 59% ability at evaluation   Time 6   Period Weeks   Status New               Plan - 07/07/16 1628    Clinical Impression Statement Pt reported he did not feel any pain following treatment today. Willcontinue to benefit from core endurance training to reach functional LTG.    PT Next Visit Plan core strengthening, traction prn; bed mobility and core control in rolling.    Consulted and Agree with Plan of Care Patient      Patient will benefit from skilled therapeutic intervention in order to improve the following deficits and impairments:     Visit Diagnosis: Right low back pain, with sciatica presence unspecified  Muscle weakness (generalized)     Problem List Patient Active Problem List   Diagnosis Date Noted  . Sensation of foreign body in eye 01/05/2016  . Chronic anticoagulation (Xarelto) 12/26/2015  . Tooth infection 02/05/2015  . Right lumbar radiculitis 11/19/2014  . Insomnia 05/22/2014  . Healthcare maintenance 12/13/2013  . Greater trochanteric bursitis of left hip 05/16/2013  . Dysphagia, unspecified(787.20)  05/10/2013  . Lumbosacral spondylosis without myelopathy 04/18/2013  . Lumbar facet arthropathy 04/18/2013  . Sacroiliac joint dysfunction of left side 02/21/2013  . Leg length discrepancy 02/21/2013  .  Seborrhea capitis 01/23/2013  . NSTEMI (non-ST elevated myocardial infarction) ([place]) 08/14/2012  . Tobacco abuse 08/08/2012  . Hypertension 09/25/2011  . GERD 04/08/2010  . Chronic radicular low back pain 07/20/2007  . Diabetes mellitus type 2, controlled ([place]) 11/25/2006  . HYPOGONADISM 11/25/2006  . Coronary atherosclerosis 11/25/2006  . Atrial fibrillation ([place]) 11/25/2006   Keondrick Dilks C. Jadavion Spoelstra PT, DPT 07/07/16 4:30 PM   [facility] [address] [place], [place], [address] Phone: [phone]   Fax:  [phone]  Name: [name] MRN: [id number] Date of Birth: [birth year]

## 2016-07-13 ENCOUNTER — Ambulatory Visit: Payer: BLUE CROSS/BLUE SHIELD | Admitting: Physical Therapy

## 2016-07-15 ENCOUNTER — Encounter: Payer: BLUE CROSS/BLUE SHIELD | Admitting: Physical Therapy

## 2016-07-20 ENCOUNTER — Ambulatory Visit: Payer: BLUE CROSS/BLUE SHIELD | Admitting: Physical Therapy

## 2016-07-21 ENCOUNTER — Encounter: Payer: Self-pay | Admitting: Physical Therapy

## 2016-07-21 ENCOUNTER — Ambulatory Visit: Payer: BLUE CROSS/BLUE SHIELD | Admitting: Physical Therapy

## 2016-07-21 DIAGNOSIS — M6281 Muscle weakness (generalized): Secondary | ICD-10-CM

## 2016-07-21 DIAGNOSIS — M545 Low back pain: Secondary | ICD-10-CM | POA: Diagnosis not present

## 2016-07-21 NOTE — Patient Instructions (Signed)
Access Code: [email or web address]  URL: [email or web address]  Date: 07/21/2016  Prepared by: Selinda Eon   Exercises  Hooklying Single Knee to Chest Stretch - 30 reps - 1 sets - 5 hold - 2x daily - 7x weekly  Supine Double Knee to Chest - 30 reps - 1 sets - 5 hold - 2x daily - 7x weekly  Supine Posterior Pelvic Tilt - 15 reps - 1 sets - 10 hold - 3x daily - 7x weekly

## 2016-07-21 NOTE — Therapy (Signed)
[place], [place], [address] Phone: [phone]   Fax:  [phone]  Physical Therapy Treatment  Patient Details  Name: [name] MRN: [id number] Date of Birth: 12/28/1955 Referring Provider: Bartholomew Crews, MD  Encounter Date: 07/21/2016      PT End of Session - 07/21/16 1602    Visit Number 7   Number of Visits 13   Date for PT Re-Evaluation 07/30/16   Authorization Type BCBS 30 visit limit   PT Start Time 1550   PT Stop Time 1640   PT Time Calculation (min) 50 min   Activity Tolerance Patient tolerated treatment well   Behavior During Therapy [facility] for tasks assessed/performed      Past Medical History:  Diagnosis Date  . Atrial arrhythmia   . Cellulitis of knee, right 06/06/12  . Chronic otitis externa 06/06/12   C Multiple trials with antibiocs and antifungal. Cultures were positive for Pseudomonas. Follow up with ENT on 06/14/12    . CORONARY ARTERY DISEASE 11/25/2006   Cardiac cath by Dr Terrence Dupont 11/2003 LV showed good LV systolic function, EF of 0000000. Left main was patent. LAD has 20-30% mid stenosis. Diagonal 1 and diagonal 2 were patent. Left circumflex was patent. OM1 was less than 0.5 mm which was diffusely diseased. OM2 has 20% ostial stenosis which was patent which  was moderate size. OM3 was patent at prior PTCA and stented site. RCA has 20-30% proximal   . Hypertension   . NSTEMI (non-ST elevated myocardial infarction) ([place]) 08/14/2012   S/p Successful PTCA to proximal OM-3     . TOBACCO ABUSE 11/25/2006  . Type II diabetes mellitus ([place])     Past Surgical History:  Procedure Laterality Date  . CORONARY ANGIOPLASTY WITH STENT PLACEMENT  ~ 2005   "1"  . CYSTECTOMY  1990's   LUA  . LEFT HEART CATHETERIZATION WITH CORONARY ANGIOGRAM N/A 08/15/2012   Procedure: LEFT HEART CATHETERIZATION WITH CORONARY ANGIOGRAM;  Surgeon: Clent Demark, MD;  Location: [facility] CATH LAB;  Service:  Cardiovascular;  Laterality: N/A;  . PERCUTANEOUS CORONARY STENT INTERVENTION (PCI-S)  08/15/2012   Procedure: PERCUTANEOUS CORONARY STENT INTERVENTION (PCI-S);  Surgeon: Clent Demark, MD;  Location: [facility] CATH LAB;  Service: Cardiovascular;;    There were no vitals filed for this visit.      Subjective Assessment - 07/21/16 1557    Subjective Attempted to do injection but reports MD was unable to place it due to arthritis. Pt reports he is depressed and about ready to give up. radicular pain comes and goes, does not feel it while working- feels it later in the day.    Currently in Pain? Yes   Pain Score 5    Pain Location Back   Pain Orientation Lower   Pain Descriptors / Indicators Aching            [facility] PT Assessment - 07/21/16 0001      Assessment   Referring Provider Bartholomew Crews, MD                     [facility] Adult PT Treatment/Exercise - 07/21/16 0001      Lumbar Exercises: Stretches   Single Knee to Chest Stretch Limitations 2' alt 5s holds   Double Knee to Chest Stretch Limitations 3' 5s holds     Lumbar Exercises: Supine   AB Set Limitations pelvic tilt with LE iso ADD 3' 5s holds  Bent Knee Raise Limitations with iso flexion press opp UE 3' alt LE     Modalities   Modalities Iontophoresis     Iontophoresis   Type of Iontophoresis Dexamethasone   Location R L5/S1   Dose 1cc   Time 5     Traction   Type of Traction Lumbar   Min (lbs) 130   Max (lbs) 30   Hold Time 60   Rest Time 10   Time 10                PT Education - 07/21/16 1601    Education provided Yes   Education Details stenosis, flexion bias program   Person(s) Educated Patient   Methods Explanation;Demonstration;Tactile cues;Verbal cues;Handout   Comprehension Verbalized understanding;Returned demonstration;Verbal cues required;Tactile cues required;Need further instruction          PT Short Term Goals - 07/06/16 1556      PT SHORT TERM GOAL #1    Title Pt will be independent with HEP as it has been developed by 8/7   Status Achieved     PT SHORT TERM GOAL #2   Title Pt will verbalize centralization of pain to low back   Status Achieved           PT Long Term Goals - 06/14/16 1734      PT LONG TERM GOAL #1   Title Pt will be able to lay supine comfortably to go to sleep by 9/1   Baseline 6   Period Weeks   Status New     PT LONG TERM GOAL #2   Title Pt will be able to climb ladder for work activities with minimal back discomfort   Time 6   Period Weeks   Status New     PT LONG TERM GOAL #3   Title Pt will verbalize ability to incorporate stretches into day to avoid excessive pain in the evenings   Time 6   Period Weeks   Status New     PT LONG TERM GOAL #4   Title Pt will verbalize average pain <=3/10 in low back to decrease effects of pain on daily activities     PT LONG TERM GOAL #5   Title FOTO to 65% ability to indicate significant functional improvement   Baseline 59% ability at evaluation   Time 6   Period Weeks   Status New               Plan - 07/21/16 1606    Clinical Impression Statement Moved to fully flexion bias program with traction. Will increase to 3x/week in attempt to decrease pain.    Rehab Potential Fair   Clinical Impairments Affecting Rehab Potential chronic pain and physical job   PT Frequency 3x / week   PT Duration 3 weeks   PT Treatment/Interventions ADLs/Self Care Home Management;Cryotherapy;Electrical Stimulation;Ultrasound;Traction;Moist Heat;Iontophoresis 4mg /ml Dexamethasone;Gait training;Functional mobility training;Therapeutic activities;Therapeutic exercise;Stair training;Balance training;Patient/family education;Neuromuscular re-education;Manual techniques;Taping;Passive range of motion;Dry needling   PT Next Visit Plan flexion program, core strengthening, traction, manual soft tissue to lower back.  FOTO   PT Home Exercise Plan SKTC, DKTC, posterior pelvic tilts.     Consulted and Agree with Plan of Care Patient      Patient will benefit from skilled therapeutic intervention in order to improve the following deficits and impairments:  Pain, Improper body mechanics, Postural dysfunction, Increased muscle spasms, Decreased strength, Difficulty walking  Visit Diagnosis: Right low back pain, with sciatica presence unspecified -  Plan: PT plan of care cert/re-cert  Muscle weakness (generalized) - Plan: PT plan of care cert/re-cert     Problem List Patient Active Problem List   Diagnosis Date Noted  . Sensation of foreign body in eye 01/05/2016  . Chronic anticoagulation (Xarelto) 12/26/2015  . Tooth infection 02/05/2015  . Right lumbar radiculitis 11/19/2014  . Insomnia 05/22/2014  . Healthcare maintenance 12/13/2013  . Greater trochanteric bursitis of left hip 05/16/2013  . Dysphagia, unspecified(787.20) 05/10/2013  . Lumbosacral spondylosis without myelopathy 04/18/2013  . Lumbar facet arthropathy 04/18/2013  . Sacroiliac joint dysfunction of left side 02/21/2013  . Leg length discrepancy 02/21/2013  . Seborrhea capitis 01/23/2013  . NSTEMI (non-ST elevated myocardial infarction) ([place]) 08/14/2012  . Tobacco abuse 08/08/2012  . Hypertension 09/25/2011  . GERD 04/08/2010  . Chronic radicular low back pain 07/20/2007  . Diabetes mellitus type 2, controlled ([place]) 11/25/2006  . HYPOGONADISM 11/25/2006  . Coronary atherosclerosis 11/25/2006  . Atrial fibrillation ([place]) 11/25/2006    Zamyra Allensworth C. Rio Taber PT, DPT 07/21/16 5:38 PM   [facility] [address] [place], [place], [address] Phone: [phone]   Fax:  [phone]  Name: [name] MRN: [id number] Date of Birth: 1956/02/14

## 2016-07-22 ENCOUNTER — Ambulatory Visit: Payer: BLUE CROSS/BLUE SHIELD | Admitting: Physical Therapy

## 2016-07-22 DIAGNOSIS — M545 Low back pain, unspecified: Secondary | ICD-10-CM

## 2016-07-22 DIAGNOSIS — M2560 Stiffness of unspecified joint, not elsewhere classified: Secondary | ICD-10-CM

## 2016-07-22 DIAGNOSIS — G8929 Other chronic pain: Secondary | ICD-10-CM

## 2016-07-22 DIAGNOSIS — M2569 Stiffness of other specified joint, not elsewhere classified: Secondary | ICD-10-CM

## 2016-07-22 DIAGNOSIS — M79604 Pain in right leg: Secondary | ICD-10-CM

## 2016-07-22 DIAGNOSIS — M629 Disorder of muscle, unspecified: Secondary | ICD-10-CM

## 2016-07-22 DIAGNOSIS — M6281 Muscle weakness (generalized): Secondary | ICD-10-CM

## 2016-07-23 NOTE — Therapy (Signed)
[place], [place], [address] Phone: [phone]   Fax:  [phone]  Physical Therapy Treatment  Patient Details  Name: [name] MRN: [id number] Date of Birth: December 14, 1955 Referring Provider: Bartholomew Crews, MD  Encounter Date: 07/22/2016      PT End of Session - 07/22/16 1641    Visit Number 8   Number of Visits 13   Date for PT Re-Evaluation 07/30/16   Authorization Type BCBS 30 visit limit   PT Start Time 0430   PT Stop Time 0520   PT Time Calculation (min) 50 min      Past Medical History:  Diagnosis Date  . Atrial arrhythmia   . Cellulitis of knee, right 06/06/12  . Chronic otitis externa 06/06/12   C Multiple trials with antibiocs and antifungal. Cultures were positive for Pseudomonas. Follow up with ENT on 06/14/12    . CORONARY ARTERY DISEASE 11/25/2006   Cardiac cath by Dr Terrence Dupont 11/2003 LV showed good LV systolic function, EF of 0000000. Left main was patent. LAD has 20-30% mid stenosis. Diagonal 1 and diagonal 2 were patent. Left circumflex was patent. OM1 was less than 0.5 mm which was diffusely diseased. OM2 has 20% ostial stenosis which was patent which  was moderate size. OM3 was patent at prior PTCA and stented site. RCA has 20-30% proximal   . Hypertension   . NSTEMI (non-ST elevated myocardial infarction) ([place]) 08/14/2012   S/p Successful PTCA to proximal OM-3     . TOBACCO ABUSE 11/25/2006  . Type II diabetes mellitus ([place])     Past Surgical History:  Procedure Laterality Date  . CORONARY ANGIOPLASTY WITH STENT PLACEMENT  ~ 2005   "1"  . CYSTECTOMY  1990's   LUA  . LEFT HEART CATHETERIZATION WITH CORONARY ANGIOGRAM N/A 08/15/2012   Procedure: LEFT HEART CATHETERIZATION WITH CORONARY ANGIOGRAM;  Surgeon: Clent Demark, MD;  Location: [facility] CATH LAB;  Service: Cardiovascular;  Laterality: N/A;  . PERCUTANEOUS CORONARY STENT INTERVENTION (PCI-S)  08/15/2012   Procedure:  PERCUTANEOUS CORONARY STENT INTERVENTION (PCI-S);  Surgeon: Clent Demark, MD;  Location: [facility] CATH LAB;  Service: Cardiovascular;;    There were no vitals filed for this visit.          [facility] PT Assessment - 07/23/16 0001      Observation/Other Assessments   Focus on Therapeutic Outcomes (FOTO)  41% disability  no change                     [facility] Adult PT Treatment/Exercise - 07/23/16 0001      Lumbar Exercises: Supine   Ab Set 5 reps   Heel Slides 20 reps   Bent Knee Raise 20 reps  2 sets   Bent Knee Raise Limitations Table Top then down one at a time.    Straight Leg Raise 15 reps   Straight Leg Raises Limitations with abset     Knee/Hip Exercises: Aerobic   Nustep LE only L6 7 min     Traction   Type of Traction Lumbar   Min (lbs) 130   Max (lbs) 30   Hold Time 60   Rest Time 10   Time 10                  PT Short Term Goals - 07/06/16 1556      PT SHORT TERM GOAL #1   Title Pt will be independent with HEP  as it has been developed by 8/7   Status Achieved     PT SHORT TERM GOAL #2   Title Pt will verbalize centralization of pain to low back   Status Achieved           PT Long Term Goals - 07/23/16 1126      PT LONG TERM GOAL #1   Title Pt will be able to lay supine comfortably to go to sleep by 9/1   Baseline 6   Time 4   Period Weeks   Status On-going     PT LONG TERM GOAL #2   Title Pt will be able to climb ladder for work activities with minimal back discomfort   Time 6   Period Weeks   Status On-going     PT LONG TERM GOAL #3   Title Pt will verbalize ability to incorporate stretches into day to avoid excessive pain in the evenings   Time 6   Period Weeks   Status Unable to assess     PT LONG TERM GOAL #4   Title Pt will verbalize average pain <=3/10 in low back to decrease effects of pain on daily activities   Time 4   Period Weeks   Status On-going     PT LONG TERM GOAL #5   Title FOTO to 65% ability  to indicate significant functional improvement   Baseline 59% ability at evaluation and 8th visit   Time 6   Period Weeks   Status On-going               Plan - 07/22/16 1710    Clinical Impression Statement Focused flexion biased core strength and followed with traction per last visit. Pt reports not working today so pain is not bad. He is concerned that spinal injections will no longer be an option for him. FOTO score unchanged from initial evaluation.    PT Next Visit Plan flexion program, core strengthening, traction, manual soft tissue to lower back.  CHECK ALL GOALS   PT Home Exercise Plan SKTC, DKTC, posterior pelvic tilts. table top, piriformis stretch, hip abduction.   Consulted and Agree with Plan of Care Patient      Patient will benefit from skilled therapeutic intervention in order to improve the following deficits and impairments:  Pain, Improper body mechanics, Postural dysfunction, Increased muscle spasms, Decreased strength, Difficulty walking  Visit Diagnosis: Right low back pain, with sciatica presence unspecified  Muscle weakness (generalized)  Decreased range of motion of trunk and back  Low back pain of over 3 months duration  Hamstring tightness of both lower extremities  Low back pain radiating to both legs     Problem List Patient Active Problem List   Diagnosis Date Noted  . Sensation of foreign body in eye 01/05/2016  . Chronic anticoagulation (Xarelto) 12/26/2015  . Tooth infection 02/05/2015  . Right lumbar radiculitis 11/19/2014  . Insomnia 05/22/2014  . Healthcare maintenance 12/13/2013  . Greater trochanteric bursitis of left hip 05/16/2013  . Dysphagia, unspecified(787.20) 05/10/2013  . Lumbosacral spondylosis without myelopathy 04/18/2013  . Lumbar facet arthropathy 04/18/2013  . Sacroiliac joint dysfunction of left side 02/21/2013  . Leg length discrepancy 02/21/2013  . Seborrhea capitis 01/23/2013  . NSTEMI (non-ST elevated  myocardial infarction) ([place]) 08/14/2012  . Tobacco abuse 08/08/2012  . Hypertension 09/25/2011  . GERD 04/08/2010  . Chronic radicular low back pain 07/20/2007  . Diabetes mellitus type 2, controlled ([place]) 11/25/2006  . HYPOGONADISM 11/25/2006  .  Coronary atherosclerosis 11/25/2006  . Atrial fibrillation ([place]) 11/25/2006    Dorene Ar, PTA 07/23/2016, 11:28 AM  [facility] [address] [place], [place], [address] Phone: [phone]   Fax:  [phone]  Name: VALLEY MENELEY MRN: [id number] Date of Birth: 1956-03-06

## 2016-07-27 ENCOUNTER — Encounter: Payer: Self-pay | Admitting: Physical Therapy

## 2016-07-27 ENCOUNTER — Ambulatory Visit: Payer: BLUE CROSS/BLUE SHIELD | Admitting: Physical Therapy

## 2016-07-27 DIAGNOSIS — M545 Low back pain: Secondary | ICD-10-CM | POA: Diagnosis not present

## 2016-07-27 DIAGNOSIS — M6281 Muscle weakness (generalized): Secondary | ICD-10-CM

## 2016-07-27 NOTE — Therapy (Signed)
[place], [place], [address] Phone: [phone]   Fax:  [phone]  Physical Therapy Treatment  Patient Details  Name: [name] MRN: [id number] Date of Birth: April 30, 1956 Referring Provider: Bartholomew Crews, MD  Encounter Date: 07/27/2016      PT End of Session - 07/27/16 1111    Visit Number 9   Number of Visits 13   Date for PT Re-Evaluation 07/30/16   Authorization Type BCBS 30 visit limit   PT Start Time 1102   PT Stop Time 1149   PT Time Calculation (min) 47 min      Past Medical History:  Diagnosis Date  . Atrial arrhythmia   . Cellulitis of knee, right 06/06/12  . Chronic otitis externa 06/06/12   C Multiple trials with antibiocs and antifungal. Cultures were positive for Pseudomonas. Follow up with ENT on 06/14/12    . CORONARY ARTERY DISEASE 11/25/2006   Cardiac cath by Dr Terrence Dupont 11/2003 LV showed good LV systolic function, EF of 0000000. Left main was patent. LAD has 20-30% mid stenosis. Diagonal 1 and diagonal 2 were patent. Left circumflex was patent. OM1 was less than 0.5 mm which was diffusely diseased. OM2 has 20% ostial stenosis which was patent which  was moderate size. OM3 was patent at prior PTCA and stented site. RCA has 20-30% proximal   . Hypertension   . NSTEMI (non-ST elevated myocardial infarction) ([place]) 08/14/2012   S/p Successful PTCA to proximal OM-3     . TOBACCO ABUSE 11/25/2006  . Type II diabetes mellitus ([place])     Past Surgical History:  Procedure Laterality Date  . CORONARY ANGIOPLASTY WITH STENT PLACEMENT  ~ 2005   "1"  . CYSTECTOMY  1990's   LUA  . LEFT HEART CATHETERIZATION WITH CORONARY ANGIOGRAM N/A 08/15/2012   Procedure: LEFT HEART CATHETERIZATION WITH CORONARY ANGIOGRAM;  Surgeon: Clent Demark, MD;  Location: [facility] CATH LAB;  Service: Cardiovascular;  Laterality: N/A;  . PERCUTANEOUS CORONARY STENT INTERVENTION (PCI-S)  08/15/2012   Procedure:  PERCUTANEOUS CORONARY STENT INTERVENTION (PCI-S);  Surgeon: Clent Demark, MD;  Location: [facility] CATH LAB;  Service: Cardiovascular;;    There were no vitals filed for this visit.      Subjective Assessment - 07/27/16 1110    Subjective Pt reports he is having some back pain today after tilling a garden yesterday that took him about 4 hours    Currently in Pain? Yes                         [facility] Adult PT Treatment/Exercise - 07/27/16 0001      Lumbar Exercises: Stretches   Single Knee to Chest Stretch Limitations 10x5s ea     Lumbar Exercises: Supine   Large Ball Abdominal Isometric Limitations 10x10s     Knee/Hip Exercises: Stretches   Piriformis Stretch 2 reps;30 seconds     Traction   Type of Traction Lumbar   Min (lbs) 30   Max (lbs) 130   Hold Time 60   Rest Time 10   Time 15                PT Education - 07/27/16 1111    Education provided Yes   Education Details exercise form/rationale   Person(s) Educated Patient   Methods Explanation;Demonstration;Tactile cues;Verbal cues   Comprehension Verbalized understanding;Returned demonstration;Verbal cues required;Tactile cues required;Need further instruction  PT Short Term Goals - 07/06/16 1556      PT SHORT TERM GOAL #1   Title Pt will be independent with HEP as it has been developed by 8/7   Status Achieved     PT SHORT TERM GOAL #2   Title Pt will verbalize centralization of pain to low back   Status Achieved           PT Long Term Goals - 07/23/16 1126      PT LONG TERM GOAL #1   Title Pt will be able to lay supine comfortably to go to sleep by 9/1   Baseline 6   Time 4   Period Weeks   Status On-going     PT LONG TERM GOAL #2   Title Pt will be able to climb ladder for work activities with minimal back discomfort   Time 6   Period Weeks   Status On-going     PT LONG TERM GOAL #3   Title Pt will verbalize ability to incorporate stretches into day to avoid  excessive pain in the evenings   Time 6   Period Weeks   Status Unable to assess     PT LONG TERM GOAL #4   Title Pt will verbalize average pain <=3/10 in low back to decrease effects of pain on daily activities   Time 4   Period Weeks   Status On-going     PT LONG TERM GOAL #5   Title FOTO to 65% ability to indicate significant functional improvement   Baseline 59% ability at evaluation and 8th visit   Time 6   Period Weeks   Status On-going               Plan - 07/27/16 1248    Clinical Impression Statement Pt reported improved pain levels following treatment. Was educated in importance of not over-doing it. Will continue with flexion bias program per POC.    PT Next Visit Plan flexion program, core strengthening, traction, manual soft tissue to lower back.  CHECK ALL GOALS   Consulted and Agree with Plan of Care Patient      Patient will benefit from skilled therapeutic intervention in order to improve the following deficits and impairments:     Visit Diagnosis: Right low back pain, with sciatica presence unspecified  Muscle weakness (generalized)     Problem List Patient Active Problem List   Diagnosis Date Noted  . Sensation of foreign body in eye 01/05/2016  . Chronic anticoagulation (Xarelto) 12/26/2015  . Tooth infection 02/05/2015  . Right lumbar radiculitis 11/19/2014  . Insomnia 05/22/2014  . Healthcare maintenance 12/13/2013  . Greater trochanteric bursitis of left hip 05/16/2013  . Dysphagia, unspecified(787.20) 05/10/2013  . Lumbosacral spondylosis without myelopathy 04/18/2013  . Lumbar facet arthropathy 04/18/2013  . Sacroiliac joint dysfunction of left side 02/21/2013  . Leg length discrepancy 02/21/2013  . Seborrhea capitis 01/23/2013  . NSTEMI (non-ST elevated myocardial infarction) ([place]) 08/14/2012  . Tobacco abuse 08/08/2012  . Hypertension 09/25/2011  . GERD 04/08/2010  . Chronic radicular low back pain 07/20/2007  . Diabetes  mellitus type 2, controlled ([place]) 11/25/2006  . HYPOGONADISM 11/25/2006  . Coronary atherosclerosis 11/25/2006  . Atrial fibrillation ([place]) 11/25/2006    Thomasena Vandenheuvel C. Jager Koska PT, DPT 07/27/16 12:54 PM ' [facility] [address] [place], [place], [address] Phone: [phone]   Fax:  [phone]  Name: [name] MRN: [id number] Date of Birth: [birth year]

## 2016-07-28 ENCOUNTER — Ambulatory Visit: Payer: BLUE CROSS/BLUE SHIELD | Admitting: Physical Therapy

## 2016-07-28 DIAGNOSIS — M545 Low back pain: Secondary | ICD-10-CM | POA: Diagnosis not present

## 2016-07-28 DIAGNOSIS — M6281 Muscle weakness (generalized): Secondary | ICD-10-CM

## 2016-07-28 NOTE — Therapy (Signed)
[place], [place], [address] Phone: [phone]   Fax:  [phone]  Physical Therapy Treatment  Patient Details  Name: [name] MRN: [id number] Date of Birth: August 11, 1956 Referring Provider: Bartholomew Crews, MD  Encounter Date: 07/28/2016      PT End of Session - 07/28/16 1418    Visit Number 10   Number of Visits 13   Date for PT Re-Evaluation 07/30/16   Authorization Type BC[employer] 30 visit limit   PT Start Time [id number]   PT Stop Time 1506   PT Time Calculation (min) 51 min   Activity Tolerance Patient tolerated treatment well   Behavior During Therapy [facility] for tasks assessed/performed      Past Medical History:  Diagnosis Date  . Atrial arrhythmia   . Cellulitis of knee, right 06/06/12  . Chronic otitis externa 06/06/12   C Multiple trials with antibiocs and antifungal. Cultures were positive for Pseudomonas. Follow up with ENT on 06/14/12    . CORONARY ARTERY DISEASE 11/25/2006   Cardiac cath by Dr Terrence Dupont 11/2003 LV showed good LV systolic function, EF of 0000000. Left main was patent. LAD has 20-30% mid stenosis. Diagonal 1 and diagonal 2 were patent. Left circumflex was patent. OM1 was less than 0.5 mm which was diffusely diseased. OM2 has 20% ostial stenosis which was patent which  was moderate size. OM3 was patent at prior PTCA and stented site. RCA has 20-30% proximal   . Hypertension   . NSTEMI (non-ST elevated myocardial infarction) ([place]) 08/14/2012   S/p Successful PTCA to proximal OM-3     . TOBACCO ABUSE 11/25/2006  . Type II diabetes mellitus ([place])     Past Surgical History:  Procedure Laterality Date  . CORONARY ANGIOPLASTY WITH STENT PLACEMENT  ~ 2005   "1"  . CYSTECTOMY  1990's   LUA  . LEFT HEART CATHETERIZATION WITH CORONARY ANGIOGRAM N/A 08/15/2012   Procedure: LEFT HEART CATHETERIZATION WITH CORONARY ANGIOGRAM;  Surgeon: Clent Demark, MD;  Location: [facility] CATH LAB;  Service:  Cardiovascular;  Laterality: N/A;  . PERCUTANEOUS CORONARY STENT INTERVENTION (PCI-S)  08/15/2012   Procedure: PERCUTANEOUS CORONARY STENT INTERVENTION (PCI-S);  Surgeon: Clent Demark, MD;  Location: [facility] C[facility];  Service: Cardiovascular;;    There were no vitals filed for this visit.      Subjective Assessment - 07/28/16 1416    Subjective Pt reports having mild back pain today with radicular pain into R leg- ant-lateral thight   Currently in Pain? Yes   Pain Score 4    Pain Location Back   Pain Orientation Lower   Pain Type Chronic pain                         [facility] Adult PT Treatment/Exercise - 07/28/16 0001      Lumbar Exercises: Supine   Ab Set 20 reps   Bent Knee Raise Limitations 2' alt LE, cues for pelvic tilt   Large Ball Abdominal Isometric 20 reps;5 seconds   Large Ball Oblique Isometric Limitations supine oblique chop, resisted by PT, GTB   Other Supine Lumbar Exercises decompression exercise series   Other Supine Lumbar Exercises pelvic tilt + iso hamstring curl over physioball 5s holds     Modalities   Modalities Electrical Stimulation     Moist Heat Therapy   Number Minutes Moist Heat 10 Minutes  concurrent with ESTIM   Moist Heat Location Lumbar  Spine     Electrical Stimulation   Electrical Stimulation Location lumbar   Electrical Stimulation Parameters IFC   Electrical Stimulation Goals Pain                PT Education - 07/28/16 1417    Education provided Yes   Education Details exercise form/rationale   Person(s) Educated Patient   Methods Explanation;Demonstration;Tactile cues;Verbal cues;Handout   Comprehension Verbalized understanding;Returned demonstration;Verbal cues required;Tactile cues required;Need further instruction          PT Short Term Goals - 07/06/16 1556      PT SHORT TERM GOAL #1   Title Pt will be independent with HEP as it has been developed by 8/7   Status Achieved     PT SHORT TERM GOAL #2    Title Pt will verbalize centralization of pain to low back   Status Achieved           PT Long Term Goals - 07/23/16 1126      PT LONG TERM GOAL #1   Title Pt will be able to lay supine comfortably to go to sleep by 9/1   Baseline 6   Time 4   Period Weeks   Status On-going     PT LONG TERM GOAL #2   Title Pt will be able to climb ladder for work activities with minimal back discomfort   Time 6   Period Weeks   Status On-going     PT LONG TERM GOAL #3   Title Pt will verbalize ability to incorporate stretches into day to avoid excessive pain in the evenings   Time 6   Period Weeks   Status Unable to assess     PT LONG TERM GOAL #4   Title Pt will verbalize average pain <=3/10 in low back to decrease effects of pain on daily activities   Time 4   Period Weeks   Status On-going     PT LONG TERM GOAL #5   Title FOTO to 65% ability to indicate significant functional improvement   Baseline 59% ability at evaluation and 8th visit   Time 6   Period Weeks   Status On-going               Plan - 07/28/16 1457    Clinical Impression Statement pt reported resolution of radicular pain with exercises today. Is going to PCP tomorrow and plans to ask for another MRI.    PT Next Visit Plan flexion program, core strengthening, traction, manual soft tissue to lower back.  CHECK ALL GOALS   Consulted and Agree with Plan of Care Patient      Patient will benefit from skilled therapeutic intervention in order to improve the following deficits and impairments:     Visit Diagnosis: Right low back pain, with sciatica presence unspecified  Muscle weakness (generalized)     Problem List Patient Active Problem List   Diagnosis Date Noted  . Sensation of foreign body in eye 01/05/2016  . Chronic anticoagulation (Xarelto) 12/26/2015  . Tooth infection 02/05/2015  . Right lumbar radiculitis 11/19/2014  . Insomnia 05/22/2014  . Healthcare maintenance 12/13/2013  .  Greater trochanteric bursitis of left hip 05/16/2013  . Dysphagia, unspecified(787.20) 05/10/2013  . Lumbosacral spondylosis without myelopathy 04/18/2013  . Lumbar facet arthropathy 04/18/2013  . Sacroiliac joint dysfunction of left side 02/21/2013  . Leg length discrepancy 02/21/2013  . Seborrhea capitis 01/23/2013  . NSTEMI (non-ST elevated myocardial infarction) ([place]) 08/14/2012  .  Tobacco abuse 08/08/2012  . Hypertension 09/25/2011  . GERD 04/08/2010  . Chronic radicular low back pain 07/20/2007  . Diabetes mellitus type 2, controlled ([place]) 11/25/2006  . HYPOGONADISM 11/25/2006  . Coronary atherosclerosis 11/25/2006  . Atrial fibrillation ([place]) 11/25/2006    Tylicia Sherman C. Surena Welge PT, DPT 07/28/16 2:59 PM   [facility] [facility] [address] [place], [place], [address] Phone: [phone]   Fax:  [phone]  Name: JT HAEFS MRN: [id number] Date of Birth: [birth year]

## 2016-07-29 ENCOUNTER — Ambulatory Visit (INDEPENDENT_AMBULATORY_CARE_PROVIDER_SITE_OTHER): Payer: BLUE CROSS/BLUE SHIELD | Admitting: Internal Medicine

## 2016-07-29 DIAGNOSIS — M47817 Spondylosis without myelopathy or radiculopathy, lumbosacral region: Secondary | ICD-10-CM

## 2016-07-29 NOTE — Patient Instructions (Signed)
Thank you for your visit today  Please continue your physical therapy and continue to exercise   You can take tylenol as needed- you can take up to 4 g of tylenol

## 2016-07-29 NOTE — Progress Notes (Signed)
   CC: continuing back pain with sciatica HPI: [name] is a 59 y.o. man with PMH noted below here for continuing back pain with sciatica   Please see Problem List/A&P for the status of the patient's chronic medical problems   Past Medical History:  Diagnosis Date  . Atrial arrhythmia   . Cellulitis of knee, right 06/06/12  . Chronic otitis externa 06/06/12   C Multiple trials with antibiocs and antifungal. Cultures were positive for Pseudomonas. Follow up with ENT on 06/14/12    . CORONARY ARTERY DISEASE 11/25/2006   Cardiac cath by Dr Terrence Dupont 11/2003 LV showed good LV systolic function, EF of 0000000. Left main was patent. LAD has 20-30% mid stenosis. Diagonal 1 and diagonal 2 were patent. Left circumflex was patent. OM1 was less than 0.5 mm which was diffusely diseased. OM2 has 20% ostial stenosis which was patent which  was moderate size. OM3 was patent at prior PTCA and stented site. RCA has 20-30% proximal   . Hypertension   . NSTEMI (non-ST elevated myocardial infarction) ([facility]) 08/14/2012   S/p Successful PTCA to proximal OM-3     . TOBACCO ABUSE 11/25/2006  . Type II diabetes mellitus ([facility])     Review of Systems: Denies fevers, chills fatigue Denies b/b incontinence  Has back pain with sciatica in both legs Denies joint pains  Physical Exam: Vitals:   07/29/16 1551  BP: 100/65  Pulse: 69  Temp: 97.7 F (36.5 C)  TempSrc: Oral  SpO2: 100%  Weight: 259 lb 9.6 oz (117.8 kg)  Height: 6\' 3"  (1.905 m)    General: A&O, in NAD CV: RRR, normal s1, s2, no m/r/g, Resp: equal and symmetric breath sounds, no wheezing or crackles  Abdomen: soft, nontender, nondistended, +BS Back: positive str leg test b/l. Back nontender to palpation in spinous processes.  Assessment & Plan:   See encounters tab for problem based medical decision making. Patient discussed with Dr. Eppie Gibson

## 2016-07-29 NOTE — Assessment & Plan Note (Signed)
Patient is here for continuing low back pain with sciatica symptoms. MRI had shown L5-S1 foraminal stenosis. He currently is on the gabapentin, and the voltaren gel. He uses tylenol PRN but not the full dose. He denies fevers, chills, point tenderness or other red flags like incontinence. He has been to several physical therapy sessions which pt says not really helped. On exam, positive str leg test bilaterally. He works as a Curator and so says that he is not able to do the kinds of work he has been doing like looking straight up at the ceiling with a roller.  Plan -advised that his symptoms will be present lifelong and that the fact that he is functional right now is encouraging. -encouraged him to continue PT and exercise -continue gabapentin -as he is on xarelto, cannot prescribe NSAIDs, nbut advised that he can take tylenol up to 4 grams per day -follow up with PCP

## 2016-07-30 NOTE — Progress Notes (Signed)
Case discussed with Dr. Saraiya at the time of the visit.  We reviewed the resident's history and exam and pertinent patient test results.  I agree with the assessment, diagnosis and plan of care documented in the resident's note. 

## 2016-08-03 ENCOUNTER — Ambulatory Visit: Payer: BLUE CROSS/BLUE SHIELD | Admitting: Physical Therapy

## 2016-08-04 ENCOUNTER — Ambulatory Visit: Payer: BLUE CROSS/BLUE SHIELD | Attending: Internal Medicine | Admitting: Physical Therapy

## 2016-08-04 ENCOUNTER — Encounter: Payer: Self-pay | Admitting: Physical Therapy

## 2016-08-04 DIAGNOSIS — M545 Low back pain: Secondary | ICD-10-CM | POA: Diagnosis not present

## 2016-08-04 DIAGNOSIS — M6281 Muscle weakness (generalized): Secondary | ICD-10-CM | POA: Diagnosis present

## 2016-08-04 NOTE — Therapy (Addendum)
[place], [place], [address] Phone: [phone]   Fax:  [phone]  Physical Therapy Treatment/Discharge Summary  Patient Details  Name: [name] MRN: [id number] Date of Birth: [birth year] Referring Provider: Bartholomew Crews, MD  Encounter Date: 08/04/2016      PT End of Session - 08/04/16 1410    Visit Number 11   Number of Visits 13   Authorization Type BCBS 30 visit limit   PT Start Time 1415   PT Stop Time 1458   PT Time Calculation (min) 43 min   Activity Tolerance Patient tolerated treatment well   Behavior During Therapy [facility] for tasks assessed/performed      Past Medical History:  Diagnosis Date  . Atrial arrhythmia   . Cellulitis of knee, right 06/06/12  . Chronic otitis externa 06/06/12   C Multiple trials with antibiocs and antifungal. Cultures were positive for Pseudomonas. Follow up with ENT on 06/14/12    . CORONARY ARTERY DISEASE 11/25/2006   Cardiac cath by Dr Terrence Dupont 11/2003 LV showed good LV systolic function, EF of 09-23%. Left main was patent. LAD has 20-30% mid stenosis. Diagonal 1 and diagonal 2 were patent. Left circumflex was patent. OM1 was less than 0.5 mm which was diffusely diseased. OM2 has 20% ostial stenosis which was patent which  was moderate size. OM3 was patent at prior PTCA and stented site. RCA has 20-30% proximal   . Hypertension   . NSTEMI (non-ST elevated myocardial infarction) ([place]) 08/14/2012   S/p Successful PTCA to proximal OM-3     . TOBACCO ABUSE 11/25/2006  . Type II diabetes mellitus ([place])     Past Surgical History:  Procedure Laterality Date  . CORONARY ANGIOPLASTY WITH STENT PLACEMENT  ~ 2005   "1"  . CYSTECTOMY  1990's   LUA  . LEFT HEART CATHETERIZATION WITH CORONARY ANGIOGRAM N/A 08/15/2012   Procedure: LEFT HEART CATHETERIZATION WITH CORONARY ANGIOGRAM;  Surgeon: Clent Demark, MD;  Location: [facility] CATH LAB;  Service: Cardiovascular;   Laterality: N/A;  . PERCUTANEOUS CORONARY STENT INTERVENTION (PCI-S)  08/15/2012   Procedure: PERCUTANEOUS CORONARY STENT INTERVENTION (PCI-S);  Surgeon: Clent Demark, MD;  Location: [facility] CATH LAB;  Service: Cardiovascular;;    There were no vitals filed for this visit.      Subjective Assessment - 08/04/16 1418    Subjective Pt continues to have pain with working.   Currently in Pain? Yes   Pain Score 4    Pain Location Back   Pain Orientation Lower            [facility] PT Assessment - 08/04/16 0001      Observation/Other Assessments   Focus on Therapeutic Outcomes (FOTO)  54% ability                     [facility] Adult PT Treatment/Exercise - 08/04/16 0001      Lumbar Exercises: Stretches   Single Knee to Chest Stretch Limitations 10x5s ea   Double Knee to Chest Stretch 5 reps;10 seconds     Lumbar Exercises: Standing   Other Standing Lumbar Exercises pelvic tilt seated & standing   Other Standing Lumbar Exercises sit to stand with abdominal control     Lumbar Exercises: Supine   Ab Set 10 reps   Large Ball Abdominal Isometric 15 reps;5 seconds   Large Ball Abdominal Isometric Limitations with small reach   Other Supine Lumbar Exercises decompression exercises  Other Supine Lumbar Exercises hooklying marches; 26/90 LE holds                PT Education - 08/04/16 1408    Education provided Yes   Education Details exercise form/rationale, possible lumbar brace during harder jobs   Person(s) Educated Patient   Methods Explanation;Demonstration;Tactile cues;Verbal cues;Handout   Comprehension Verbalized understanding;Returned demonstration;Verbal cues required;Tactile cues required          PT Short Term Goals - 07/06/16 1556      PT SHORT TERM GOAL #1   Title Pt will be independent with HEP as it has been developed by 8/7   Status Achieved     PT SHORT TERM GOAL #2   Title Pt will verbalize centralization of pain to low back   Status  Achieved           PT Long Term Goals - 08/04/16 1452      PT LONG TERM GOAL #1   Title Pt will be able to lay supine comfortably to go to sleep by 9/1   Status Achieved     PT LONG TERM GOAL #2   Title Pt will be able to climb ladder for work activities with minimal back discomfort   Baseline moderate   Status Not Met     PT LONG TERM GOAL #3   Title Pt will verbalize ability to incorporate stretches into day to avoid excessive pain in the evenings   Status Achieved     PT LONG TERM GOAL #4   Title Pt will verbalize average pain <=3/10 in low back to decrease effects of pain on daily activities   Baseline 6/10 on average   Status Not Met     PT LONG TERM GOAL #5   Title FOTO to 65% ability to indicate significant functional improvement   Baseline 59%   Status Not Met               Plan - 08/04/16 1459    Clinical Impression Statement Pt has not made significant functional improvement with physical therapy at this time and is being d/c to independent program. Pt verbalized comfort and understanding of exercises and was able to demonstrate with minimal cuing. Was provided with HEP printout. Instructed to contact us with any further questions or needs.    Consulted and Agree with Plan of Care Patient;Family member/caregiver   Family Member Consulted Wife      Patient will benefit from skilled therapeutic intervention in order to improve the following deficits and impairments:     Visit Diagnosis: Right low back pain, with sciatica presence unspecified  Muscle weakness (generalized)     Problem List Patient Active Problem List   Diagnosis Date Noted  . Sensation of foreign body in eye 01/05/2016  . Chronic anticoagulation (Xarelto) 12/26/2015  . Tooth infection 02/05/2015  . Right lumbar radiculitis 11/19/2014  . Insomnia 05/22/2014  . Healthcare maintenance 12/13/2013  . Greater trochanteric bursitis of left hip 05/16/2013  . Dysphagia,  unspecified(787.20) 05/10/2013  . Lumbosacral spondylosis without myelopathy 04/18/2013  . Lumbar facet arthropathy 04/18/2013  . Sacroiliac joint dysfunction of left side 02/21/2013  . Leg length discrepancy 02/21/2013  . Seborrhea capitis 01/23/2013  . NSTEMI (non-ST elevated myocardial infarction) ([place]) 08/14/2012  . Tobacco abuse 08/08/2012  . Hypertension 09/25/2011  . GERD 04/08/2010  . Chronic radicular low back pain 07/20/2007  . Diabetes mellitus type 2, controlled ([place]) 11/25/2006  . HYPOGONADISM 11/25/2006  . Coronary  atherosclerosis 11/25/2006  . Atrial fibrillation ([place]) 11/25/2006    Israel Wunder C. Solara Goodchild PT, DPT 08/04/16 3:01 PM   [facility] [address] [place], [place], [address] Phone: [phone]   Fax:  [phone]  Name: [name] MRN: [id number] Date of Birth: August 09, 1956   PHYSICAL THERAPY DISCHARGE SUMMARY  Visits from Start of Care: 11  Current functional level related to goals / functional outcomes: See above   Remaining deficits: See above   Education / Equipment: Anatomy of condition, POC, HEP, exercise form/rationale, work Personal assistant  Plan: Patient agrees to discharge.  Patient goals were partially met. Patient is being discharged due to lack of progress.  ?????

## 2016-08-06 ENCOUNTER — Other Ambulatory Visit: Payer: Self-pay | Admitting: Internal Medicine

## 2016-08-06 DIAGNOSIS — E119 Type 2 diabetes mellitus without complications: Secondary | ICD-10-CM

## 2016-08-17 ENCOUNTER — Other Ambulatory Visit: Payer: Self-pay | Admitting: Internal Medicine

## 2016-08-17 DIAGNOSIS — M47817 Spondylosis without myelopathy or radiculopathy, lumbosacral region: Secondary | ICD-10-CM

## 2016-08-25 ENCOUNTER — Emergency Department (HOSPITAL_COMMUNITY): Payer: BLUE CROSS/BLUE SHIELD

## 2016-08-25 ENCOUNTER — Inpatient Hospital Stay (HOSPITAL_COMMUNITY)
Admission: EM | Admit: 2016-08-25 | Discharge: 2016-08-28 | DRG: 871 | Disposition: A | Discharge status: Home / Self Care | Payer: BLUE CROSS/BLUE SHIELD | Attending: Internal Medicine | Admitting: Internal Medicine

## 2016-08-25 ENCOUNTER — Encounter (HOSPITAL_COMMUNITY): Payer: Self-pay | Admitting: Emergency Medicine

## 2016-08-25 DIAGNOSIS — Z955 Presence of coronary angioplasty implant and graft: Secondary | ICD-10-CM

## 2016-08-25 DIAGNOSIS — R197 Diarrhea, unspecified: Secondary | ICD-10-CM | POA: Diagnosis not present

## 2016-08-25 DIAGNOSIS — I252 Old myocardial infarction: Secondary | ICD-10-CM

## 2016-08-25 DIAGNOSIS — R112 Nausea with vomiting, unspecified: Secondary | ICD-10-CM | POA: Diagnosis not present

## 2016-08-25 DIAGNOSIS — R319 Hematuria, unspecified: Secondary | ICD-10-CM | POA: Diagnosis present

## 2016-08-25 DIAGNOSIS — R509 Fever, unspecified: Secondary | ICD-10-CM | POA: Diagnosis present

## 2016-08-25 DIAGNOSIS — Z7901 Long term (current) use of anticoagulants: Secondary | ICD-10-CM

## 2016-08-25 DIAGNOSIS — J189 Pneumonia, unspecified organism: Secondary | ICD-10-CM

## 2016-08-25 DIAGNOSIS — Z79899 Other long term (current) drug therapy: Secondary | ICD-10-CM | POA: Diagnosis not present

## 2016-08-25 DIAGNOSIS — I10 Essential (primary) hypertension: Secondary | ICD-10-CM | POA: Diagnosis present

## 2016-08-25 DIAGNOSIS — I480 Paroxysmal atrial fibrillation: Secondary | ICD-10-CM | POA: Diagnosis present

## 2016-08-25 DIAGNOSIS — Z7982 Long term (current) use of aspirin: Secondary | ICD-10-CM | POA: Diagnosis not present

## 2016-08-25 DIAGNOSIS — F17200 Nicotine dependence, unspecified, uncomplicated: Secondary | ICD-10-CM | POA: Diagnosis present

## 2016-08-25 DIAGNOSIS — K529 Noninfective gastroenteritis and colitis, unspecified: Secondary | ICD-10-CM | POA: Diagnosis present

## 2016-08-25 DIAGNOSIS — A481 Legionnaires' disease: Secondary | ICD-10-CM | POA: Diagnosis not present

## 2016-08-25 DIAGNOSIS — B370 Candidal stomatitis: Secondary | ICD-10-CM | POA: Diagnosis not present

## 2016-08-25 DIAGNOSIS — M549 Dorsalgia, unspecified: Secondary | ICD-10-CM | POA: Diagnosis not present

## 2016-08-25 DIAGNOSIS — A419 Sepsis, unspecified organism: Secondary | ICD-10-CM | POA: Diagnosis present

## 2016-08-25 DIAGNOSIS — Z7984 Long term (current) use of oral hypoglycemic drugs: Secondary | ICD-10-CM | POA: Diagnosis not present

## 2016-08-25 DIAGNOSIS — I2510 Atherosclerotic heart disease of native coronary artery without angina pectoris: Secondary | ICD-10-CM | POA: Diagnosis present

## 2016-08-25 DIAGNOSIS — E119 Type 2 diabetes mellitus without complications: Secondary | ICD-10-CM | POA: Diagnosis present

## 2016-08-25 LAB — URINALYSIS, ROUTINE W REFLEX MICROSCOPIC
BILIRUBIN URINE: NEGATIVE
Glucose, UA: 250 mg/dL — AB
Ketones, ur: NEGATIVE mg/dL
Leukocytes, UA: NEGATIVE
Nitrite: NEGATIVE
Specific Gravity, Urine: 1.034 — ABNORMAL HIGH (ref 1.005–1.030)
pH: 7 (ref 5.0–8.0)

## 2016-08-25 LAB — I-STAT TROPONIN, ED: TROPONIN I, POC: 0.03 ng/mL (ref 0.00–0.08)

## 2016-08-25 LAB — URINE MICROSCOPIC-ADD ON
SQUAMOUS EPITHELIAL / LPF: NONE SEEN
WBC, UA: NONE SEEN WBC/hpf (ref 0–5)

## 2016-08-25 LAB — CBC
HCT: 42.9 % (ref 39.0–52.0)
HEMOGLOBIN: 14.3 g/dL (ref 13.0–17.0)
MCH: 28.4 pg (ref 26.0–34.0)
MCHC: 33.3 g/dL (ref 30.0–36.0)
MCV: 85.3 fL (ref 78.0–100.0)
Platelets: 163 10*3/uL (ref 150–400)
RBC: 5.03 MIL/uL (ref 4.22–5.81)
RDW: 15.7 % — ABNORMAL HIGH (ref 11.5–15.5)
WBC: 28 10*3/uL — AB (ref 4.0–10.5)

## 2016-08-25 LAB — HEPATIC FUNCTION PANEL
ALT: 17 U/L (ref 17–63)
AST: 16 U/L (ref 15–41)
Albumin: 3.3 g/dL — ABNORMAL LOW (ref 3.5–5.0)
Alkaline Phosphatase: 62 U/L (ref 38–126)
BILIRUBIN DIRECT: 0.1 mg/dL (ref 0.1–0.5)
BILIRUBIN INDIRECT: 1.1 mg/dL — AB (ref 0.3–0.9)
Total Bilirubin: 1.2 mg/dL (ref 0.3–1.2)
Total Protein: 7.5 g/dL (ref 6.5–8.1)

## 2016-08-25 LAB — BASIC METABOLIC PANEL
ANION GAP: 8 (ref 5–15)
BUN: 15 mg/dL (ref 6–20)
CALCIUM: 8.9 mg/dL (ref 8.9–10.3)
CO2: 25 mmol/L (ref 22–32)
Chloride: 106 mmol/L (ref 101–111)
Creatinine, Ser: 1.04 mg/dL (ref 0.61–1.24)
GLUCOSE: 183 mg/dL — AB (ref 65–99)
Potassium: 3.4 mmol/L — ABNORMAL LOW (ref 3.5–5.1)
SODIUM: 139 mmol/L (ref 135–145)

## 2016-08-25 LAB — I-STAT CG4 LACTIC ACID, ED
LACTIC ACID, VENOUS: 1.97 mmol/L — AB (ref 0.5–1.9)
Lactic Acid, Venous: 0.88 mmol/L (ref 0.5–1.9)

## 2016-08-25 LAB — LIPASE, BLOOD: Lipase: 18 U/L (ref 11–51)

## 2016-08-25 MED ORDER — DEXTROSE 5 % IV SOLN
500.0000 mg | Freq: Once | INTRAVENOUS | Status: AC
  Administered 2016-08-25: 500 mg via INTRAVENOUS
  Filled 2016-08-25: qty 500

## 2016-08-25 MED ORDER — DEXTROSE 5 % IV SOLN
500.0000 mg | INTRAVENOUS | Status: DC
  Administered 2016-08-26: 500 mg via INTRAVENOUS
  Filled 2016-08-25 (×2): qty 500

## 2016-08-25 MED ORDER — ONDANSETRON HCL 4 MG/2ML IJ SOLN: 4.0000 mg | Freq: Four times a day (QID) | INTRAMUSCULAR | Status: DC | PRN

## 2016-08-25 MED ORDER — SODIUM CHLORIDE 0.9 % IV BOLUS (SEPSIS)
1000.0000 mL | Freq: Once | INTRAVENOUS | Status: AC
  Administered 2016-08-25: 1000 mL via INTRAVENOUS

## 2016-08-25 MED ORDER — RIVAROXABAN 20 MG PO TABS
20.0000 mg | ORAL_TABLET | Freq: Every day | ORAL | Status: DC
  Administered 2016-08-26 – 2016-08-28 (×3): 20 mg via ORAL
  Filled 2016-08-25 (×3): qty 1

## 2016-08-25 MED ORDER — SODIUM CHLORIDE 0.9 % IV BOLUS (SEPSIS)
500.0000 mL | Freq: Once | INTRAVENOUS | Status: AC
  Administered 2016-08-25: 500 mL via INTRAVENOUS

## 2016-08-25 MED ORDER — ACETAMINOPHEN 650 MG RE SUPP: 650.0000 mg | Freq: Four times a day (QID) | RECTAL | Status: DC | PRN

## 2016-08-25 MED ORDER — SODIUM CHLORIDE 0.9% FLUSH
3.0000 mL | Freq: Two times a day (BID) | INTRAVENOUS | Status: DC
  Administered 2016-08-26 – 2016-08-28 (×4): 3 mL via INTRAVENOUS

## 2016-08-25 MED ORDER — ACETAMINOPHEN 325 MG PO TABS
650.0000 mg | ORAL_TABLET | Freq: Once | ORAL | Status: AC
  Administered 2016-08-25: 650 mg via ORAL
  Filled 2016-08-25: qty 2

## 2016-08-25 MED ORDER — ONDANSETRON HCL 4 MG/2ML IJ SOLN
4.0000 mg | Freq: Once | INTRAMUSCULAR | Status: AC
  Administered 2016-08-25: 4 mg via INTRAVENOUS
  Filled 2016-08-25: qty 2

## 2016-08-25 MED ORDER — DEXTROSE 5 % IV SOLN
1.0000 g | INTRAVENOUS | Status: DC
  Administered 2016-08-26 – 2016-08-27 (×2): 1 g via INTRAVENOUS
  Filled 2016-08-25 (×2): qty 10

## 2016-08-25 MED ORDER — DEXTROSE 5 % IV SOLN
1.0000 g | Freq: Once | INTRAVENOUS | Status: AC
  Administered 2016-08-25: 1 g via INTRAVENOUS
  Filled 2016-08-25: qty 10

## 2016-08-25 MED ORDER — ONDANSETRON HCL 4 MG PO TABS
4.0000 mg | ORAL_TABLET | Freq: Four times a day (QID) | ORAL | Status: DC | PRN
  Administered 2016-08-26 (×2): 4 mg via ORAL
  Filled 2016-08-25 (×2): qty 1

## 2016-08-25 MED ORDER — ACETAMINOPHEN 325 MG PO TABS
650.0000 mg | ORAL_TABLET | Freq: Four times a day (QID) | ORAL | Status: DC | PRN
  Administered 2016-08-26 (×2): 650 mg via ORAL
  Filled 2016-08-25 (×2): qty 2

## 2016-08-25 NOTE — H&P (Signed)
Date: 08/26/2016               Patient Name:  [name] MRN: [id number]  DOB: May 10, 1956 Age / Sex: 60 y.o., male   PCP: Valinda Party, DO         Medical Service: Internal Medicine Teaching Service         Attending Physician: Dr. Carlyle Basques, MD    First Contact: Dr. Ophelia Shoulder Pager: [phone]  Second Contact: Dr. Julious Oka Pager: [phone]       After Hours (After 5p/  First Contact Pager: [phone]  weekends / holidays): Second Contact Pager: [phone]   Chief Complaint: Sepsis secondary to community-acquired pneumonia  History of Present Illness: [name] is a 60 year old male with PMH of T2DM, CAD, HTN, PAF on xarelto, who originally presented to the [facility] emergency department on 08/25/2016 complaining of fevers, HA, body aches, productive cough, shortness of breath, nausea, vomiting and diarrhea. The patient reported that his symptoms began a few days ago with body aches, cough, shortness of breath and fever and worsened day before admission with increasing symptoms and new onset of N/V/D. He denies sick contacts or recent illness. He feels that he has sputum that he is unable to cough up, and denies hemoptysis. He endorses right sided chest pain that is exacerbated by cough and deep breaths. Vomit is non bloody, non bilious. Diarrhea is watery, accompanied by constant lower abdominal pain, but without blood or melena; he reports about 9 episodes in the last 2 days. Patient was being seen by ENT for chronic otitis media and prescribed clindamycin (of which he took only one dose due to giving him stomach pain), and later keflex which he has not started. He denies dysuria, hematuria, flank pain, or increased urgency.   In the emergency department the patient was febrile to 100.6, HR 101, RR 29, BP 143/81 and satting 100% on room air. Labs were significant for leukocytosis with a white blood cell count 28.0 and a venous lactic acid of 1.97. UA was significant for  glucose, hemoglobin, >300 protein, too many to count RBC's and inc specific gravity. Lipase and troponin were negative. Blood cultures, urinalysis and urine culture were also ordered. Chest x-ray demonstrated right upper lobe pneumonia. The patient was given 2.5 L of normal saline fluid and started on azithromycin and ceftriaxone for the treatment of community-acquired pneumonia complicated by sepsis. He was then transferred to the [facility] for admission to the internal medicine teaching service for further workup and evaluation.  Meds:  Current Meds  Medication Sig  . acetaminophen (TYLENOL) 500 MG tablet Take 1 tablet (500 mg total) by mouth every 8 (eight) hours as needed for fever. (Patient taking differently: Take 500-1,000 mg by mouth every 8 (eight) hours as needed for moderate pain or fever. )  . aspirin EC 81 MG tablet Take 1 tablet (81 mg total) by mouth daily.  Marland Kitchen gabapentin (NEURONTIN) 300 MG capsule TAKE ONE CAPSULE BY MOUTH THREE TIMES DAILY (Patient taking differently: TAKE 300mg  CAPSULE BY MOUTH THREE TIMES DAILY)  . glipiZIDE (GLUCOTROL) 10 MG tablet TAKE ONE TABLET BY MOUTH ONCE DAILY  . isosorbide mononitrate (IMDUR) 30 MG 24 hr tablet TAKE ONE TABLET BY MOUTH ONCE DAILY (Patient taking differently: TAKE 30mg  TABLET BY MOUTH ONCE DAILY)  . metFORMIN (GLUCOPHAGE) 1000 MG tablet TAKE ONE TABLET BY MOUTH TWICE DAILY WITH MEALS  . morphine (MSIR) 15 MG tablet Take 15 mg by  mouth every 4 (four) hours as needed. for pain  . NITROSTAT 0.4 MG SL tablet DISSOLVE ONE TABLET UNDER THE TONGUE EVERY 5 MINUTES AS NEEDED FOR CHEST PAIN.  DO NOT EXCEED A TOTAL OF 3 DOSES IN 15 MINUTES  . rivaroxaban (XARELTO) 20 MG TABS tablet Take 1 tablet (20 mg total) by mouth daily.  . sotalol (BETAPACE) 80 MG tablet TAKE ONE-HALF TABLET BY MOUTH TWICE DAILY (Patient taking differently: TAKE 40mg  or ONE-HALF TABLET BY MOUTH once DAILY)  . spironolactone (ALDACTONE) 25 MG tablet Take 25 mg by mouth  daily.   Marland Kitchen triamcinolone lotion (KENALOG) 0.1 % Apply topically 2 (two) times daily. (Patient taking differently: Apply 1 application topically 2 (two) times daily as needed (rash). )  . VOLTAREN 1 % GEL Apply 2 g topically 4 (four) times daily. (Patient taking differently: Apply 2 g topically 4 (four) times daily as needed (pain). )     Allergies: Allergies as of 08/25/2016 - Review Complete 08/25/2016  Allergen Reaction Noted  . Clindamycin/lincomycin Diarrhea and Nausea And Vomiting 08/25/2016   Past Medical History:  Diagnosis Date  . Atrial arrhythmia   . Cellulitis of knee, right 06/06/12  . Chronic otitis externa 06/06/12   C Multiple trials with antibiocs and antifungal. Cultures were positive for Pseudomonas. Follow up with ENT on 06/14/12    . CORONARY ARTERY DISEASE 11/25/2006   Cardiac cath by Dr Terrence Dupont 11/2003 LV showed good LV systolic function, EF of 0000000. Left main was patent. LAD has 20-30% mid stenosis. Diagonal 1 and diagonal 2 were patent. Left circumflex was patent. OM1 was less than 0.5 mm which was diffusely diseased. OM2 has 20% ostial stenosis which was patent which  was moderate size. OM3 was patent at prior PTCA and stented site. RCA has 20-30% proximal   . Hypertension   . NSTEMI (non-ST elevated myocardial infarction) ([place]) 08/14/2012   S/p Successful PTCA to proximal OM-3     . TOBACCO ABUSE 11/25/2006  . Type II diabetes mellitus ([place])     Family History: Mother died of unknown cancer  Social History: Works as a Curator. Smokes about 0.5ppd x 4 years; denies alcohol use and abuse; remote history of marijuana use, no current illicit drug use  Review of Systems: A complete ROS was negative except as per HPI.   Physical Exam: Blood pressure 121/75, pulse 71, temperature 97.8 F (36.6 C), temperature source Oral, resp. rate 17, height 6\' 3"  (1.905 m), weight 114.9 kg (253 lb 6.4 oz), SpO2 97 %. Constitutional: NAD, lying in bed comfortably with eyes  closed CV: RRR, no murmurs, rubs or gallops appreciated Resp: mild diffuse wheezing, expiratory rhonchi appreciated on R>L, exam limited by inadequately deep breaths, no increased work of breathing.  Abd: soft, decreased but present BS, mildly tender to palpation in LLQ, +CVA tenderness on R Ext: warm, minimal bilateral LE edema, 2+ pulses   EKG: No acute ST segment elevations, EKG without significant change from priors.  CXR: Right upper lobe consolidation consistent with pneumonia  Assessment & Plan by Problem: Mr. Siplin is a 60 year old male with a past medical history of coronary artery disease, hypertension, type 2 diabetes and paroxysmal atrial fibrillation on Xarelto who presents with sepsis secondary to community-acquired pneumonia.   Sepsis secondary to community-acquired pneumonia: The patient presented with a respiratory rate of 28, febrile to 100.6 with a leukocytosis and a chest x-ray demonstrating a right upper lobe consolidation consistent with pneumonia. He was given 2.5 L of  IV fluids in the emergency department and started on azithromycin and ceftriaxone for community-acquired pneumonia. He also has pleuritic chest pain with unchanged EKG and - troponin --continue azithromycin and ceftriaxone --obtain infuenza panel; legionella urine ag; strep pneumo urine ag --acetaminophen for fever and body aches --O2 via [place] to maintain O2 sats >92% --IVF at 125ml/hr NS --f/u AM CBC --f/u blood and urine cultures  Nausea, vomiting, diarrhea: Patient with 2 day history of nausea, vomiting, diarrhea, and lower abdominal pain that began after fevers, cough, and body aches. Patient had negligent exposure to clindamycin earlier this month. These symptoms could be related to his pneumonia if part of flu syndrome or legionella infection.  --zofran 4mg  q6 PRN --obtain infuenza panel; legionella urine ag; strep pneumo urine ag --f/u GI panel --test for C. Diff  --enteric  precautions  Hematuria and flank pain: Patient found to have too many to count RBC's, >300 protein and increased specific gravity on UA. He denies noticing hematuria but lab tech noted amber color to urine. His Cr is 1.04 and Patient denies history of kidney stones or kidney pathology. Could be 2/2 concurrent respiratory/gi infection(s). 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 He is also on chronic Xarelto therapy for PAF. --repeat UA --f/u PT/INR/aPPT, AM CBC --consider further workup for post-infectious glomerulonephritis, IgA nephropathy if appropriate  T2DM: Patient with history of T2DM managed with glipizide and metformin. Last A1c 7.6 on 05/04/2016. --hold metformin and glipizide --SSI with meals  PAF: Patient with history of PAF on chronic Xarelto therapy and rate controlled with sotalol 40mg  BID, currently in sinus rhythm --cardiac monitoring --continue xarelto 20mg  daily and sotalol 40mg  BID  CAD: Patient with history of CAD s/p stent in 2005 and 2013. Patient managed with pravastatin --continue pravastatin 40mg   HTN: Patient with history of hypertension controlled with sotalol 40mg  BID, imdur 30mg  daily, and spironolactone 25mg  daily. BP controlled on admission. --continue home regimen and monitor BP  Dispo: Admit patient to Inpatient with expected length of stay greater than 2 midnights.  Signed: Alphonzo Grieve, MD 08/26/2016, 12:32 AM  Pager: [phone]

## 2016-08-25 NOTE — ED Notes (Signed)
[facility] notified for patient transport.

## 2016-08-25 NOTE — Progress Notes (Signed)
Pharmacy Antibiotic Note  [name] is a 61 y.o. male admitted on 08/25/2016 with sepsis.  Pharmacy has been consulted for ceftriaxone/azithromycin dosing.  Plan:  Ceftriaxone 1gm IV q24h  Azithromycin 500mg  IV q24h  Neither antibiotic requires renal dose adjustment so pharmacy will sign-off at this time.      Temp (24hrs), Avg:100.2 F (37.9 C), Min:99.7 F (37.6 C), Max:100.6 F (38.1 C)   Recent Labs Lab 08/25/16 1056 08/25/16 1221  WBC 28.0*  --   CREATININE 1.04  --   LATICACIDVEN  --  1.97*    CrCl cannot be calculated (Unknown ideal weight.).    No Known Allergies   Thank you for allowing pharmacy to be a part of this patient's care.  Doreene Eland, PharmD, BCPS.   Pager: [phone] 08/25/2016 12:43 PM

## 2016-08-25 NOTE — ED Provider Notes (Signed)
[facility] DEPT Provider Note   CSN: [id number] Arrival date & time: 08/25/16  1035     History   Chief Complaint Chief Complaint  Patient presents with  . Chest Pain    HPI [name] is a 60 y.o. male.  [name] is a 60 y.o. Male who presents to the emergency department with his wife complaining of fevers, body aches, productive cough, shortness of breath, nausea, vomiting and diarrhea for the past 3 days. Patient reports his symptoms began Monday with high fevers of 104. patient had associated nausea, vomiting and diarrhea. He also reports increased coughing and shortness of breath. He reports some generalized body aches including chest pain that is worse with coughing. She reports 2 episodes of vomiting and 4 episodes of watery stool today. No hematemesis or hematochezia. He reports generalized abdominal pain. He has been taking Tylenol for fevers and he last had it around 7:30 or 8 AM this morning. Patient has a history of paroxysmal atrial fibrillation and is on Xarelto. No hemoptysis, hematochezia, hematemesis, urinary symptoms, rashes, leg pain, leg swelling, or syncope.   The history is provided by the patient and the spouse. No language interpreter was used.  Chest Pain   Associated symptoms include abdominal pain, cough, a fever, nausea, shortness of breath and vomiting. Pertinent negatives include no back pain, no headaches, no palpitations and no weakness.    Past Medical History:  Diagnosis Date  . Atrial arrhythmia   . Cellulitis of knee, right 06/06/12  . Chronic otitis externa 06/06/12   C Multiple trials with antibiocs and antifungal. Cultures were positive for Pseudomonas. Follow up with ENT on 06/14/12    . CORONARY ARTERY DISEASE 11/25/2006   Cardiac cath by Dr Terrence Dupont 11/2003 LV showed good LV systolic function, EF of 0000000. Left main was patent. LAD has 20-30% mid stenosis. Diagonal 1 and diagonal 2 were patent. Left circumflex was patent. OM1 was less  than 0.5 mm which was diffusely diseased. OM2 has 20% ostial stenosis which was patent which  was moderate size. OM3 was patent at prior PTCA and stented site. RCA has 20-30% proximal   . Hypertension   . NSTEMI (non-ST elevated myocardial infarction) ([place]) 08/14/2012   S/p Successful PTCA to proximal OM-3     . TOBACCO ABUSE 11/25/2006  . Type II diabetes mellitus [facility])     Patient Active Problem List   Diagnosis Date Noted  . Sepsis ([place]) 08/25/2016  . Sensation of foreign body in eye 01/05/2016  . Chronic anticoagulation (Xarelto) 12/26/2015  . Tooth infection 02/05/2015  . Right lumbar radiculitis 11/19/2014  . Insomnia 05/22/2014  . Healthcare maintenance 12/13/2013  . Greater trochanteric bursitis of left hip 05/16/2013  . Dysphagia, unspecified(787.20) 05/10/2013  . Lumbosacral spondylosis without myelopathy 04/18/2013  . Lumbar facet arthropathy 04/18/2013  . Sacroiliac joint dysfunction of left side 02/21/2013  . Leg length discrepancy 02/21/2013  . Seborrhea capitis 01/23/2013  . NSTEMI (non-ST elevated myocardial infarction) ([place]) 08/14/2012  . Tobacco abuse 08/08/2012  . Hypertension 09/25/2011  . GERD 04/08/2010  . Chronic radicular low back pain 07/20/2007  . Diabetes mellitus type 2, controlled ([place]) 11/25/2006  . HYPOGONADISM 11/25/2006  . Coronary atherosclerosis 11/25/2006  . Atrial fibrillation ([place]) 11/25/2006    Past Surgical History:  Procedure Laterality Date  . CORONARY ANGIOPLASTY WITH STENT PLACEMENT  ~ 2005   "1"  . CYSTECTOMY  1990's   LUA  . LEFT HEART CATHETERIZATION WITH CORONARY ANGIOGRAM N/A 08/15/2012  Procedure: LEFT HEART CATHETERIZATION WITH CORONARY ANGIOGRAM;  Surgeon: Clent Demark, MD;  Location: [facility] CATH LAB;  Service: Cardiovascular;  Laterality: N/A;  . PERCUTANEOUS CORONARY STENT INTERVENTION (PCI-S)  08/15/2012   Procedure: PERCUTANEOUS CORONARY STENT INTERVENTION (PCI-S);  Surgeon: Clent Demark, MD;  Location: [facility] CATH LAB;   Service: Cardiovascular;;       Home Medications    Prior to Admission medications   Medication Sig Start Date End Date Taking? Authorizing Provider  acetaminophen (TYLENOL) 500 MG tablet Take 1 tablet (500 mg total) by mouth every 8 (eight) hours as needed for fever. Patient taking differently: Take 500-1,000 mg by mouth every 8 (eight) hours as needed for moderate pain or fever.  11/01/13  Yes Otho Bellows, MD  aspirin EC 81 MG tablet Take 1 tablet (81 mg total) by mouth daily. 08/23/13  Yes Otho Bellows, MD  gabapentin (NEURONTIN) 300 MG capsule TAKE ONE CAPSULE BY MOUTH THREE TIMES DAILY Patient taking differently: TAKE 300mg  CAPSULE BY MOUTH THREE TIMES DAILY 08/18/16  Yes Jessica Ratliff Hoffman, DO  glipiZIDE (GLUCOTROL) 10 MG tablet TAKE ONE TABLET BY MOUTH ONCE DAILY 08/09/16  Yes Jessica Ratliff Hoffman, DO  isosorbide mononitrate (IMDUR) 30 MG 24 hr tablet TAKE ONE TABLET BY MOUTH ONCE DAILY Patient taking differently: TAKE 30mg  TABLET BY MOUTH ONCE DAILY 08/29/15  Yes Iline Oven, MD  metFORMIN (GLUCOPHAGE) 1000 MG tablet TAKE ONE TABLET BY MOUTH TWICE DAILY WITH MEALS 06/04/16  Yes Axel Filler, MD  morphine (MSIR) 15 MG tablet Take 15 mg by mouth every 4 (four) hours as needed. for pain 08/20/16  Yes Historical Provider, MD  NITROSTAT 0.4 MG SL tablet DISSOLVE ONE TABLET UNDER THE TONGUE EVERY 5 MINUTES AS NEEDED FOR CHEST PAIN.  DO NOT EXCEED A TOTAL OF 3 DOSES IN 15 MINUTES 11/18/14  Yes Otho Bellows, MD  rivaroxaban (XARELTO) 20 MG TABS tablet Take 1 tablet (20 mg total) by mouth daily. 05/19/16  Yes Iline Oven, MD  sotalol (BETAPACE) 80 MG tablet TAKE ONE-HALF TABLET BY MOUTH TWICE DAILY Patient taking differently: TAKE 40mg  or ONE-HALF TABLET BY MOUTH once DAILY 10/28/15  Yes Iline Oven, MD  spironolactone (ALDACTONE) 25 MG tablet Take 25 mg by mouth daily.  08/06/15  Yes Historical Provider, MD  triamcinolone lotion (KENALOG) 0.1 % Apply  topically 2 (two) times daily. Patient taking differently: Apply 1 application topically 2 (two) times daily as needed (rash).  09/26/15  Yes Carly J Rivet, MD  VOLTAREN 1 % GEL Apply 2 g topically 4 (four) times daily. Patient taking differently: Apply 2 g topically 4 (four) times daily as needed (pain).  05/04/16  Yes Iline Oven, MD  cephALEXin (KEFLEX) 500 MG capsule Take 500 mg by mouth 4 (four) times daily. For 15 days 08/16/16   Historical Provider, MD  ketorolac (ACULAR) 0.4 % SOLN Place 1 drop into the left eye 4 (four) times daily. Patient not taking: Reported on 08/25/2016 01/05/16   Corky Sox, MD  polyvinyl alcohol (LIQUIFILM TEARS) 1.4 % ophthalmic solution Place 1 drop into both eyes as needed for dry eyes. Patient not taking: Reported on 08/25/2016 04/11/15   Langley Gauss Moding, MD  pravastatin (PRAVACHOL) 40 MG tablet Take 1 tablet (40 mg total) by mouth daily. Patient not taking: Reported on 08/25/2016 12/23/15   Iline Oven, MD    Family History No family history on file.  Social History Social History  Substance  Use Topics  . Smoking status: Former Smoker    Packs/day: 0.20    Years: 12.00    Types: Cigarettes    Quit date: 11/29/2014  . Smokeless tobacco: Never Used     Comment: quit about 14mths ago  . Alcohol use No     Allergies   Clindamycin/lincomycin   Review of Systems Review of Systems  Constitutional: Positive for fatigue and fever.  HENT: Negative for congestion, sore throat and trouble swallowing.   Eyes: Negative for visual disturbance.  Respiratory: Positive for cough and shortness of breath.   Cardiovascular: Positive for chest pain. Negative for palpitations and leg swelling.  Gastrointestinal: Positive for abdominal pain, diarrhea, nausea and vomiting. Negative for blood in stool.  Genitourinary: Negative for difficulty urinating and dysuria.  Musculoskeletal: Negative for back pain and neck pain.  Skin: Negative for rash.    Neurological: Negative for syncope, weakness, light-headedness and headaches.     Physical Exam Updated Vital Signs BP 143/81   Pulse 90   Temp 100.6 F (38.1 C) (Rectal)   Resp (!) 28   SpO2 100%   Physical Exam  Constitutional: He is oriented to person, place, and time. He appears well-developed and well-nourished. No distress.  HENT:  Head: Normocephalic and atraumatic.  Mouth/Throat: Oropharynx is clear and moist.  Eyes: Conjunctivae are normal. Pupils are equal, round, and reactive to light. Right eye exhibits no discharge. Left eye exhibits no discharge.  Neck: Neck supple. No JVD present.  Cardiovascular: Normal rate, regular rhythm, normal heart sounds and intact distal pulses.  Exam reveals no gallop and no friction rub.   No murmur heard. Pulmonary/Chest: Effort normal. No stridor. No respiratory distress. He has rales.  Crackles and diminished lung sounds bilaterally, worse in left base. Symmetric chest expansion bilaterally. No increased work of breathing.  Abdominal: Soft. Bowel sounds are normal. He exhibits no distension. There is tenderness. There is no guarding.  Abdomen is soft. Bowel sounds are present. Patient's abdomen is mildly diffusely tender to palpation. No focal tenderness. No peritoneal signs.  Musculoskeletal: He exhibits no edema.  Lymphadenopathy:    He has no cervical adenopathy.  Neurological: He is alert and oriented to person, place, and time. Coordination normal.  Skin: Skin is warm and dry. Capillary refill takes less than 2 seconds. No rash noted. He is not diaphoretic. No erythema. No pallor.  Psychiatric: He has a normal mood and affect. His behavior is normal.  Nursing note and vitals reviewed.    ED Treatments / Results  Labs (all labs ordered are listed, but only abnormal results are displayed) Labs Reviewed  BASIC METABOLIC PANEL - Abnormal; Notable for the following:       Result Value   Potassium 3.4 (*)    Glucose, Bld 183  (*)    All other components within normal limits  CBC - Abnormal; Notable for the following:    WBC 28.0 (*)    RDW 15.7 (*)    All other components within normal limits  HEPATIC FUNCTION PANEL - Abnormal; Notable for the following:    Albumin 3.3 (*)    Indirect Bilirubin 1.1 (*)    All other components within normal limits  I-STAT CG4 LACTIC ACID, ED - Abnormal; Notable for the following:    Lactic Acid, Venous 1.97 (*)    All other components within normal limits  GASTROINTESTINAL PANEL BY PCR, STOOL (REPLACES STOOL CULTURE)  CULTURE, BLOOD (ROUTINE X 2)  CULTURE, BLOOD (ROUTINE X  2)  URINE CULTURE  LIPASE, BLOOD  URINALYSIS, ROUTINE W REFLEX MICROSCOPIC (NOT AT [facility])  I-STAT TROPOININ, ED  I-STAT CG4 LACTIC ACID, ED    EKG  EKG Interpretation  Date/Time:  Wednesday August 25 2016 10:45:57 EDT Ventricular Rate:  96 PR Interval:    QRS Duration: 91 QT Interval:  320 QTC Calculation: 405 R Axis:   83 Text Interpretation:  Sinus rhythm Nonspecific repol abnormality, diffuse leads ST-t wave abnormality No significant change since last tracing Abnormal ekg Confirmed by Carmin Muskrat  MD [phone]) on 08/25/2016 10:49:58 AM       Radiology Dg Chest 2 View  Result Date: 08/25/2016 CLINICAL DATA:  Fever and shortness of breath.  Chest pain. EXAM: CHEST  2 VIEW COMPARISON:  04/09/2015 FINDINGS: There is a consolidative infiltrate in the posterior aspect of the right upper lobe. Popping peribronchial thickening. No effusions. Heart size and vascularity are normal. Coronary artery stent in place. Chronic fullness of the aortopulmonary window probably due to enlargement of the main pulmonary artery. IMPRESSION: Right upper lobe pneumonia.  Bilateral bronchitic changes. Electronically Signed   By: Lorriane Shire M.D.   On: 08/25/2016 12:42    Procedures Procedures (including critical care time)  Medications Ordered in ED Medications  cefTRIAXone (ROCEPHIN) 1 g in dextrose 5 %  50 mL IVPB (not administered)  azithromycin (ZITHROMAX) 500 mg in dextrose 5 % 250 mL IVPB (not administered)  sodium chloride 0.9 % bolus 500 mL (0 mLs Intravenous Stopped 08/25/16 1256)  cefTRIAXone (ROCEPHIN) 1 g in dextrose 5 % 50 mL IVPB (0 g Intravenous Stopped 08/25/16 1328)  azithromycin (ZITHROMAX) 500 mg in dextrose 5 % 250 mL IVPB (500 mg Intravenous New Bag/Given 08/25/16 1329)  sodium chloride 0.9 % bolus 1,000 mL (1,000 mLs Intravenous New Bag/Given 08/25/16 1304)  ondansetron (ZOFRAN) injection 4 mg (4 mg Intravenous Given 08/25/16 1419)     Initial Impression / Assessment and Plan / ED Course  I have reviewed the triage vital signs and the nursing notes.  Pertinent labs & imaging results that were available during my care of the patient were reviewed by me and considered in my medical decision making (see chart for details).  Clinical Course   This  is a 60 y.o. Male who presents to the emergency department with his wife complaining of fevers, body aches, productive cough, shortness of breath, nausea, vomiting and diarrhea for the past 3 days. Patient reports his symptoms began Monday with high fevers of 104. patient had associated nausea, vomiting and diarrhea. He also reports increased coughing and shortness of breath. On arrival to the emergency department patient had an oxygen saturation of 84% on room air. This improved with 3 L via nasal cannula. His rectal temperature is 100.6. He has diminished lung sounds and crackles bilaterally on lung exam. He has diffuse abdominal tenderness without focal tenderness. He is alert and oriented 3.  Patient's white count returned at 28,000 and with his fever of 100.6 code sepsis was activated at the time of my evaluation. Lactic acid returned mildly elevated 1.97.  Troponin is not elevated. Creatinine is 1.04. Antibodies were ordered for community-acquired pneumonia. Chest x-ray pending. Chest x-ray revealed right upper lobe pneumonia and  bilateral bronchitic changes. Patient has started his antibiotics.  13:36  sepsis recheck completed. Patient has good capillary refill. He is having some nausea still. We'll provide her with Zofran. He is hemodynamically stable. I discussed plan for admission and he agrees with plan. I consulted  with internal medicine teaching service. The patient is a patient of internal medicine. They accepted the patient for admission to stepdown and Dr. Baxter Flattery is the attending physician.  I ordered repeat lactic acid which is pending. Patient awaiting transfer to [facility] for admission.   This patient was discussed with Dr. Vanita Panda who agrees with assessment and plan.   Final Clinical Impressions(s) / ED Diagnoses   Final diagnoses:  Sepsis, due to unspecified organism ([place])  CAP (community acquired pneumonia)  Nausea vomiting and diarrhea    New Prescriptions New Prescriptions   No medications on file     Waynetta Pean, PA-C 08/25/16 [place], MD 08/25/16 [phone]

## 2016-08-25 NOTE — ED Triage Notes (Signed)
Pt reports central chest pain , no radiation. Also reports nausea. Hx heart attack. denies shortness of breath. Alert and oriented x 4. EKG exam in process.

## 2016-08-26 DIAGNOSIS — R197 Diarrhea, unspecified: Secondary | ICD-10-CM

## 2016-08-26 DIAGNOSIS — R109 Unspecified abdominal pain: Secondary | ICD-10-CM

## 2016-08-26 DIAGNOSIS — J189 Pneumonia, unspecified organism: Secondary | ICD-10-CM

## 2016-08-26 DIAGNOSIS — R319 Hematuria, unspecified: Secondary | ICD-10-CM

## 2016-08-26 DIAGNOSIS — R112 Nausea with vomiting, unspecified: Secondary | ICD-10-CM

## 2016-08-26 DIAGNOSIS — A419 Sepsis, unspecified organism: Principal | ICD-10-CM

## 2016-08-26 LAB — URINE MICROSCOPIC-ADD ON

## 2016-08-26 LAB — GASTROINTESTINAL PANEL BY PCR, STOOL (REPLACES STOOL CULTURE)
ADENOVIRUS F40/41: NOT DETECTED
ASTROVIRUS: NOT DETECTED
CAMPYLOBACTER SPECIES: NOT DETECTED
CRYPTOSPORIDIUM: NOT DETECTED
Cyclospora cayetanensis: NOT DETECTED
E. coli O157: NOT DETECTED
ENTEROTOXIGENIC E COLI (ETEC): NOT DETECTED
Entamoeba histolytica: NOT DETECTED
Enteroaggregative E coli (EAEC): NOT DETECTED
Enteropathogenic E coli (EPEC): NOT DETECTED
GIARDIA LAMBLIA: NOT DETECTED
NOROVIRUS GI/GII: NOT DETECTED
PLESIMONAS SHIGELLOIDES: NOT DETECTED
ROTAVIRUS A: NOT DETECTED
SALMONELLA SPECIES: NOT DETECTED
SAPOVIRUS (I, II, IV, AND V): NOT DETECTED
SHIGA LIKE TOXIN PRODUCING E COLI (STEC): NOT DETECTED
Shigella/Enteroinvasive E coli (EIEC): NOT DETECTED
VIBRIO CHOLERAE: NOT DETECTED
Vibrio species: NOT DETECTED
Yersinia enterocolitica: NOT DETECTED

## 2016-08-26 LAB — CBC
HEMATOCRIT: 37.1 % — AB (ref 39.0–52.0)
HEMOGLOBIN: 11.8 g/dL — AB (ref 13.0–17.0)
MCH: 27.8 pg (ref 26.0–34.0)
MCHC: 31.8 g/dL (ref 30.0–36.0)
MCV: 87.5 fL (ref 78.0–100.0)
Platelets: 140 10*3/uL — ABNORMAL LOW (ref 150–400)
RBC: 4.24 MIL/uL (ref 4.22–5.81)
RDW: 15.9 % — ABNORMAL HIGH (ref 11.5–15.5)
WBC: 24.4 10*3/uL — AB (ref 4.0–10.5)

## 2016-08-26 LAB — C DIFFICILE QUICK SCREEN W PCR REFLEX
C DIFFICILE (CDIFF) INTERP: NOT DETECTED
C DIFFICILE (CDIFF) TOXIN: NEGATIVE
C DIFFICLE (CDIFF) ANTIGEN: NEGATIVE

## 2016-08-26 LAB — URINALYSIS, ROUTINE W REFLEX MICROSCOPIC
Glucose, UA: 100 mg/dL — AB
Ketones, ur: 15 mg/dL — AB
Leukocytes, UA: NEGATIVE
Nitrite: NEGATIVE
Protein, ur: >300 mg/dL — AB
pH: 6 (ref 5.0–8.0)

## 2016-08-26 LAB — GLUCOSE, CAPILLARY
GLUCOSE-CAPILLARY: 136 mg/dL — AB (ref 65–99)
GLUCOSE-CAPILLARY: 149 mg/dL — AB (ref 65–99)
GLUCOSE-CAPILLARY: 106 mg/dL — AB (ref 65–99)
Glucose-Capillary: 121 mg/dL — ABNORMAL HIGH (ref 65–99)

## 2016-08-26 LAB — COMPREHENSIVE METABOLIC PANEL
ALBUMIN: 2.4 g/dL — AB (ref 3.5–5.0)
ALT: 12 U/L — AB (ref 17–63)
AST: 11 U/L — AB (ref 15–41)
Alkaline Phosphatase: 53 U/L (ref 38–126)
Anion gap: 5 (ref 5–15)
BUN: 12 mg/dL (ref 6–20)
CHLORIDE: 107 mmol/L (ref 101–111)
CO2: 27 mmol/L (ref 22–32)
Calcium: 8.3 mg/dL — ABNORMAL LOW (ref 8.9–10.3)
Creatinine, Ser: 1.07 mg/dL (ref 0.61–1.24)
GFR calc Af Amer: >60 mL/min (ref >60)
Glucose, Bld: 157 mg/dL — ABNORMAL HIGH (ref 65–99)
POTASSIUM: 3.7 mmol/L (ref 3.5–5.1)
SODIUM: 139 mmol/L (ref 135–145)
Total Bilirubin: 0.6 mg/dL (ref 0.3–1.2)
Total Protein: 6.3 g/dL — ABNORMAL LOW (ref 6.5–8.1)

## 2016-08-26 LAB — URINE CULTURE: Culture: NO GROWTH

## 2016-08-26 LAB — MRSA PCR SCREENING: MRSA BY PCR: NEGATIVE

## 2016-08-26 LAB — INFLUENZA PANEL BY PCR (TYPE A & B)
H1N1FLUPCR: NOT DETECTED
INFLAPCR: NEGATIVE
Influenza B By PCR: NEGATIVE

## 2016-08-26 LAB — STREP PNEUMONIAE URINARY ANTIGEN: STREP PNEUMO URINARY ANTIGEN: NEGATIVE

## 2016-08-26 LAB — APTT: APTT: 37 s — AB (ref 24–36)

## 2016-08-26 LAB — PROTIME-INR
INR: 1.22
Prothrombin Time: 15.5 seconds — ABNORMAL HIGH (ref 11.4–15.2)

## 2016-08-26 MED ORDER — POTASSIUM CHLORIDE 20 MEQ/15ML (10%) PO SOLN
40.0000 meq | Freq: Once | ORAL | Status: AC
  Administered 2016-08-26: 40 meq via ORAL
  Filled 2016-08-26: qty 30

## 2016-08-26 MED ORDER — DICLOFENAC SODIUM 1 % TD GEL
4.0000 g | Freq: Four times a day (QID) | TRANSDERMAL | Status: DC | PRN
  Filled 2016-08-26: qty 100

## 2016-08-26 MED ORDER — GABAPENTIN 300 MG PO CAPS
300.0000 mg | ORAL_CAPSULE | Freq: Three times a day (TID) | ORAL | Status: DC
  Administered 2016-08-26 – 2016-08-28 (×7): 300 mg via ORAL
  Filled 2016-08-26 (×7): qty 1

## 2016-08-26 MED ORDER — SODIUM CHLORIDE 0.9 % IV SOLN
INTRAVENOUS | Status: AC
  Administered 2016-08-26 (×2): via INTRAVENOUS

## 2016-08-26 MED ORDER — ISOSORBIDE MONONITRATE ER 30 MG PO TB24
30.0000 mg | ORAL_TABLET | Freq: Every day | ORAL | Status: DC
  Administered 2016-08-26 – 2016-08-28 (×3): 30 mg via ORAL
  Filled 2016-08-26 (×3): qty 1

## 2016-08-26 MED ORDER — IBUPROFEN 200 MG PO TABS
600.0000 mg | ORAL_TABLET | Freq: Four times a day (QID) | ORAL | Status: DC | PRN
  Administered 2016-08-26: 600 mg via ORAL
  Filled 2016-08-26: qty 3

## 2016-08-26 MED ORDER — INSULIN ASPART 100 UNIT/ML ~~LOC~~ SOLN
0.0000 [IU] | Freq: Three times a day (TID) | SUBCUTANEOUS | Status: DC
  Administered 2016-08-26 (×2): 1 [IU] via SUBCUTANEOUS
  Administered 2016-08-27: 2 [IU] via SUBCUTANEOUS
  Administered 2016-08-27 (×2): 1 [IU] via SUBCUTANEOUS

## 2016-08-26 MED ORDER — SODIUM CHLORIDE 0.9 % IV SOLN
INTRAVENOUS | Status: AC
  Administered 2016-08-26 – 2016-08-27 (×2): via INTRAVENOUS

## 2016-08-26 MED ORDER — SPIRONOLACTONE 25 MG PO TABS
25.0000 mg | ORAL_TABLET | Freq: Every day | ORAL | Status: DC
  Administered 2016-08-26 – 2016-08-28 (×3): 25 mg via ORAL
  Filled 2016-08-26 (×3): qty 1

## 2016-08-26 MED ORDER — SOTALOL HCL 80 MG PO TABS
40.0000 mg | ORAL_TABLET | Freq: Two times a day (BID) | ORAL | Status: DC
  Administered 2016-08-26 – 2016-08-28 (×5): 40 mg via ORAL
  Filled 2016-08-26 (×6): qty 0.5

## 2016-08-26 NOTE — Progress Notes (Signed)
Subjective: Patient continues to have diarrhea and myalgias. He states that he is feeling somewhat improved. He has no additional questions this morning.  Objective:  Vital signs in last 24 hours: Vitals:   08/26/16 0017 08/26/16 0500 08/26/16 0746 08/26/16 1304  BP: 121/75  119/75 113/68  Pulse: 71  (!) 59 (!) 58  Resp: 17  15 16   Temp: 97.8 F (36.6 C)  98.2 F (36.8 C) 98 F (36.7 C)  TempSrc: Oral  Oral Oral  SpO2: 97%  95% 97%  Weight:  257 lb 8 oz (116.8 kg)    Height:       Physical Exam  Constitutional: He is oriented to person, place, and time. He appears well-developed and well-nourished.  Cardiovascular: Normal rate and regular rhythm.  Exam reveals no gallop and no friction rub.   No murmur heard. Respiratory: Effort normal. He has wheezes. He has rales.  Patient with mild wheezing and right lower lobe crackles on auscultation.  GI: Soft. Bowel sounds are normal. He exhibits no distension. There is no tenderness.  Musculoskeletal: He exhibits no edema.  Neurological: He is alert and oriented to person, place, and time.     Assessment/Plan: Mr. Earnst is a 60 year old male with a past medical history of coronary artery disease, hypertension, type 2 diabetes and paroxysmal atrial fibrillation on Xarelto who presents with sepsis secondary to community-acquired pneumonia.   1. Sepsis secondary to community-acquired pneumonia, improving The patient presented with a respiratory rate of 28, febrile to 100.6 with a leukocytosis and a chest x-ray demonstrating a right upper lobe consolidation consistent with pneumonia. He was given 2.5 L of IV fluids in the emergency department and started on azithromycin and ceftriaxone for community-acquired pneumonia.  --continue azithromycin and ceftriaxone --obtain infuenza panel; legionella urine ag; strep pneumo urine ag --acetaminophen for fever and body aches --O2 via [place] to maintain O2 sats >92% --IVF at 160ml/hr NS --  Leukocytosis- 24.4 --f/u blood and urine cultures  2. Nausea, vomiting, diarrhea Patient with 2 day history of nausea, vomiting, diarrhea, and lower abdominal pain that began after fevers, cough, and body aches. Patient had negligent exposure to clindamycin earlier this month. These symptoms could be related to his pneumonia if part of flu syndrome or legionella infection.  --zofran 4mg  q6 PRN --obtain infuenza panel; legionella urine ag; strep pneumo urine ag --f/u GI panel --C. difficile negative --enteric precautions  3. Hematuria and flank pain  Patient found to have too many to count RBC's, >300 protein and increased specific gravity on UA. He denies noticing hematuria but lab tech noted amber color to urine. His Cr is 1.04 and Patient denies history of kidney stones or kidney pathology. Could be 2/2 concurrent respiratory/gi infection(s). 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 He is also on chronic Xarelto therapy for PAF. -- Creatinine 1.07 -- INR 1.22, APTT 37 --consider further workup for post-infectious glomerulonephritis, IgA nephropathy if appropriate  4. T2DM Patient with history of T2DM managed with glipizide and metformin. Last A1c 7.6 on 05/04/2016. --hold metformin and glipizide --SSI with meals  5. PAF Patient with history of PAF on chronic Xarelto therapy and rate controlled with sotalol 40mg  BID, currently in sinus rhythm --cardiac monitoring --continue xarelto 20mg  daily and sotalol 40mg  BID  6. CAD Patient with history of CAD s/p stent in 2005 and 2013. Patient managed with pravastatin --continue pravastatin 40mg   7. HTN Patient with history of  hypertension controlled with sotalol 40mg  BID, imdur 30mg  daily, and spironolactone 25mg  daily. BP controlled on admission. --continue home regimen and monitor BP  Dispo: Anticipated discharge in approximately 1-2 day(s).   Ophelia Shoulder, MD 08/26/2016, 1:21  PM Pager: [phone]

## 2016-08-27 DIAGNOSIS — Z79899 Other long term (current) drug therapy: Secondary | ICD-10-CM

## 2016-08-27 DIAGNOSIS — I2510 Atherosclerotic heart disease of native coronary artery without angina pectoris: Secondary | ICD-10-CM

## 2016-08-27 DIAGNOSIS — I480 Paroxysmal atrial fibrillation: Secondary | ICD-10-CM

## 2016-08-27 DIAGNOSIS — Z7984 Long term (current) use of oral hypoglycemic drugs: Secondary | ICD-10-CM

## 2016-08-27 DIAGNOSIS — B370 Candidal stomatitis: Secondary | ICD-10-CM

## 2016-08-27 DIAGNOSIS — Z7901 Long term (current) use of anticoagulants: Secondary | ICD-10-CM

## 2016-08-27 DIAGNOSIS — A481 Legionnaires' disease: Secondary | ICD-10-CM

## 2016-08-27 DIAGNOSIS — M549 Dorsalgia, unspecified: Secondary | ICD-10-CM

## 2016-08-27 DIAGNOSIS — E119 Type 2 diabetes mellitus without complications: Secondary | ICD-10-CM

## 2016-08-27 DIAGNOSIS — I10 Essential (primary) hypertension: Secondary | ICD-10-CM

## 2016-08-27 DIAGNOSIS — F17200 Nicotine dependence, unspecified, uncomplicated: Secondary | ICD-10-CM

## 2016-08-27 LAB — BASIC METABOLIC PANEL
ANION GAP: 4 — AB (ref 5–15)
BUN: 11 mg/dL (ref 6–20)
CALCIUM: 8.4 mg/dL — AB (ref 8.9–10.3)
CO2: 23 mmol/L (ref 22–32)
Chloride: 110 mmol/L (ref 101–111)
Creatinine, Ser: 0.85 mg/dL (ref 0.61–1.24)
GFR calc Af Amer: >60 mL/min (ref >60)
GLUCOSE: 131 mg/dL — AB (ref 65–99)
Potassium: 3.9 mmol/L (ref 3.5–5.1)
SODIUM: 137 mmol/L (ref 135–145)

## 2016-08-27 LAB — CK: Total CK: 43 U/L — ABNORMAL LOW (ref 49–397)

## 2016-08-27 LAB — CBC
HCT: 34.5 % — ABNORMAL LOW (ref 39.0–52.0)
Hemoglobin: 10.9 g/dL — ABNORMAL LOW (ref 13.0–17.0)
MCH: 27.6 pg (ref 26.0–34.0)
MCHC: 31.6 g/dL (ref 30.0–36.0)
MCV: 87.3 fL (ref 78.0–100.0)
PLATELETS: 128 10*3/uL — AB (ref 150–400)
RBC: 3.95 MIL/uL — AB (ref 4.22–5.81)
RDW: 15.9 % — AB (ref 11.5–15.5)
WBC: 9.9 10*3/uL (ref 4.0–10.5)

## 2016-08-27 LAB — HEMOGLOBIN A1C
HEMOGLOBIN A1C: 6.5 % — AB (ref 4.8–5.6)
MEAN PLASMA GLUCOSE: 140 mg/dL

## 2016-08-27 LAB — GLUCOSE, CAPILLARY
GLUCOSE-CAPILLARY: 136 mg/dL — AB (ref 65–99)
Glucose-Capillary: 133 mg/dL — ABNORMAL HIGH (ref 65–99)
Glucose-Capillary: 158 mg/dL — ABNORMAL HIGH (ref 65–99)
Glucose-Capillary: 147 mg/dL — ABNORMAL HIGH (ref 65–99)

## 2016-08-27 LAB — LEGIONELLA PNEUMOPHILA SEROGP 1 UR AG: L. pneumophila Serogp 1 Ur Ag: NEGATIVE

## 2016-08-27 MED ORDER — ALBUTEROL SULFATE (2.5 MG/3ML) 0.083% IN NEBU: 2.5000 mg | INHALATION_SOLUTION | RESPIRATORY_TRACT | Status: DC | PRN

## 2016-08-27 MED ORDER — ALBUTEROL SULFATE (2.5 MG/3ML) 0.083% IN NEBU
2.5000 mg | INHALATION_SOLUTION | Freq: Once | RESPIRATORY_TRACT | Status: AC
  Administered 2016-08-27: 2.5 mg via RESPIRATORY_TRACT
  Filled 2016-08-27: qty 3

## 2016-08-27 MED ORDER — CEFUROXIME AXETIL 500 MG PO TABS
500.0000 mg | ORAL_TABLET | Freq: Two times a day (BID) | ORAL | Status: DC
  Administered 2016-08-27: 500 mg via ORAL
  Filled 2016-08-27 (×2): qty 1

## 2016-08-27 MED ORDER — MAGIC MOUTHWASH
5.0000 mL | Freq: Three times a day (TID) | ORAL | Status: DC
  Administered 2016-08-27 – 2016-08-28 (×3): 5 mL via ORAL
  Filled 2016-08-27 (×5): qty 5

## 2016-08-27 MED ORDER — MAGIC MOUTHWASH
1.0000 mL | Freq: Three times a day (TID) | ORAL | Status: DC
  Filled 2016-08-27: qty 5

## 2016-08-27 MED ORDER — CEFUROXIME SODIUM 1.5 G IJ SOLR: 500.0000 mg | Freq: Three times a day (TID) | INTRAMUSCULAR | Status: DC

## 2016-08-27 MED ORDER — CEFUROXIME AXETIL 500 MG PO TABS
500.0000 mg | ORAL_TABLET | Freq: Two times a day (BID) | ORAL | Status: DC
  Administered 2016-08-28: 500 mg via ORAL
  Filled 2016-08-27 (×2): qty 1

## 2016-08-27 MED ORDER — INSULIN ASPART 100 UNIT/ML ~~LOC~~ SOLN
0.0000 [IU] | Freq: Three times a day (TID) | SUBCUTANEOUS | Status: DC
  Administered 2016-08-28: 1 [IU] via SUBCUTANEOUS
  Administered 2016-08-28: 2 [IU] via SUBCUTANEOUS

## 2016-08-27 MED ORDER — AZITHROMYCIN 500 MG PO TABS
500.0000 mg | ORAL_TABLET | Freq: Every day | ORAL | Status: DC
  Administered 2016-08-28: 500 mg via ORAL
  Filled 2016-08-27: qty 1

## 2016-08-27 NOTE — Progress Notes (Signed)
Subjective: No acute events overnight. Patient states that he is feeling much better this morning. He says that his diarrhea has almost entirely resolved. He is breathing more comfortably and is able to have good by mouth intake. He had no additional questions this morning.  Objective:  Vital signs in last 24 hours: Vitals:   08/26/16 1627 08/26/16 2018 08/27/16 0000 08/27/16 0400  BP: 116/74 (!) 102/52 103/69 122/77  Pulse: (!) 56 (!) 56 (!) 55 60  Resp: 20 17 (!) 21 16  Temp: 98.9 F (37.2 C) 98 F (36.7 C) 97.8 F (36.6 C) 97.8 F (36.6 C)  TempSrc: Oral Oral Oral Oral  SpO2: 100% 100% 98% 100%  Weight:    261 lb 12.8 oz (118.8 kg)  Height:       Physical Exam  Constitutional: He is oriented to person, place, and time. He appears well-developed and well-nourished.  HENT:  Head: Normocephalic and atraumatic.  Cardiovascular: Normal rate and regular rhythm.  Exam reveals no gallop and no friction rub.   No murmur heard. Respiratory: Effort normal. He has wheezes. He has rales.  Patient with mild bilateral wheezes. Crackles appreciated but appeared more prominent on the left compared with the right. Overall his pulmonary examination is improved from prior  GI: Soft. Bowel sounds are normal. He exhibits no distension. There is no tenderness.  Musculoskeletal: He exhibits no edema.  Neurological: He is alert and oriented to person, place, and time.     Assessment/Plan: Mr. Macari is a 60 year old male with a past medical history of coronary artery disease, hypertension, type 2 diabetes and paroxysmal atrial fibrillation on Xarelto who presents with sepsis secondary to community-acquired pneumonia.  In summary, patient has improved greatly. He says his diarrhea has almost completely resolved. He is breathing better and his pulmonary examination is improved this morning. We'll transition to PO antibiotics and transfer him to a normal floor.  1. Community-acquired pneumonia,  improving  Patient no longer septic. Symptomatically and objectively improving. -- Oral azithromycin for 7 days from (08/27/2016), oral cefuroxime 500 mg twice a day for 2 days from (08/27/2016) -- Follow legionella urine antigen --acetaminophen for fever and body aches --IVF at 172ml/hr NS -- Leukocytosis- resolved at 9.9 -- Blood and urine cultures no growth to date  2. Nausea, vomiting, diarrhea, resolved Patient states that his diarrhea is completely resolved. He denied nausea or vomiting this morning.  --zofran 4mg  q6 PRN --obtain infuenza panel; legionella urine ag; strep pneumo urine ag -- GI Pathogen panel negative --C. difficile negative  3. Hematuria and flank pain  Patient found to have too many to count RBC's, >300 protein and increased specific gravity on UA. He denies noticing hematuria but lab tech noted amber color to urine. His Cr is 1.04 and Patient denies history of kidney stones or kidney pathology. Could be 2/2 concurrent respiratory/gi infection(s). 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 He is also on chronic Xarelto therapy for PAF. -- Creatinine 0.85 -- INR 1.22, APTT 37 --consider further workup for post-infectious glomerulonephritis, IgA nephropathy if appropriate -- We'll make recommendation for outpatient follow-up.  4. T2DM Patient with history of T2DM managed with glipizide and metformin. Last A1c 7.6 on 05/04/2016. --hold metformin and glipizide --SSI with meals  5. PAF Patient with history of PAF on chronic Xarelto therapy and rate controlled with sotalol 40mg  BID, currently in sinus rhythm --cardiac monitoring --continue xarelto 20mg  daily and sotalol  40mg  BID  6. CAD Patient with history of CAD s/p stent in 2005 and 2013. Patient managed with pravastatin --continue pravastatin 40mg   7. HTN Patient with history of hypertension controlled with sotalol 40mg  BID, imdur 30mg  daily, and spironolactone  25mg  daily. BP controlled on admission. --continue home regimen and monitor BP  8. Oral candidiasis White plaques consistent with oral candidiasis located on the superior hard palate. -- Magic mouthwash  Dispo: Anticipated discharge tomorrow.   Ophelia Shoulder, MD 08/27/2016, 7:52 AM Pager: [phone]

## 2016-08-28 DIAGNOSIS — K529 Noninfective gastroenteritis and colitis, unspecified: Secondary | ICD-10-CM

## 2016-08-28 LAB — GLUCOSE, CAPILLARY
Glucose-Capillary: 141 mg/dL — ABNORMAL HIGH (ref 65–99)
Glucose-Capillary: 176 mg/dL — ABNORMAL HIGH (ref 65–99)

## 2016-08-28 MED ORDER — NITROGLYCERIN 0.4 MG SL SUBL: 0.4000 mg | SUBLINGUAL_TABLET | SUBLINGUAL | 0 refills | Status: DC | PRN

## 2016-08-28 MED ORDER — SOTALOL HCL 80 MG PO TABS: 40.0000 mg | ORAL_TABLET | Freq: Two times a day (BID) | ORAL | 0 refills | Status: DC

## 2016-08-28 MED ORDER — CEFUROXIME AXETIL 500 MG PO TABS: 500.0000 mg | ORAL_TABLET | Freq: Two times a day (BID) | ORAL | 0 refills | Status: DC

## 2016-08-28 NOTE — Plan of Care (Signed)
Problem: Education: Goal: Knowledge of [facility] Education information/materials will improve Outcome: Progressing Per shift report patient was informed about disease process and pending transfer with teach back given

## 2016-08-28 NOTE — Progress Notes (Signed)
Subjective: Patient with no complaints this morning. No SOB or difficulty breathing. Not requiring any No further diarrhea. Eating and drinking without difficulty.   Objective: Vital signs in last 24 hours: Vitals:   08/28/16 0400 08/28/16 0500 08/28/16 0803 08/28/16 1212  BP: 118/70  133/76   Pulse: 65 66    Resp: (!) 21 (!) 23    Temp:   99.2 F (37.3 C) 99.1 F (37.3 C)  TempSrc:   Oral Oral  SpO2: 96% 97%    Weight:      Height:       Weight change: 2 lb 1.6 oz (0.953 kg)  Intake/Output Summary (Last 24 hours) at 08/28/16 1309 Last data filed at 08/28/16 1215  Gross per 24 hour  Intake              960 ml  Output             3225 ml  Net            -2265 ml    Physical Exam  Constitutional: He appears well-developed and well-nourished.  Cardiovascular: Normal rate and regular rhythm.  Exam reveals no gallop and no friction rub.   No murmur heard. Respiratory: Effort normal. Mild bibasilar crackles, L>R. No wheezing.  GI: Soft. Bowel sounds are normal. He exhibits no distension. There is no tenderness.  Musculoskeletal: He exhibits no edema.  Neurological: He is alert and oriented to person, place, and time.   Medications: I have reviewed the patient's current medications. Scheduled Meds: . azithromycin  500 mg Oral Daily  . cefUROXime  500 mg Oral BID WC  . gabapentin  300 mg Oral TID  . insulin aspart  0-9 Units Subcutaneous TID WC  . isosorbide mononitrate  30 mg Oral Daily  . magic mouthwash  5 mL Oral TID  . rivaroxaban  20 mg Oral Daily  . sodium chloride flush  3 mL Intravenous Q12H  . sotalol  40 mg Oral BID  . spironolactone  25 mg Oral Daily   Continuous Infusions:  PRN Meds:.acetaminophen **OR** acetaminophen, albuterol, diclofenac sodium, ibuprofen, ondansetron **OR** ondansetron (ZOFRAN) IV Assessment/Plan:  1. Community-acquired pneumonia: Clinically improved. No SOB or difficulty breathing. He is not requring any supplemental O2 with 100% O2  sat on room air. Legionella urine antigen negative. Will D/C azithro today. Ready for discharge.  --D/C azithromycin -oral cefuroxime 500 mg twice a day, last day today --acetaminophen for fever and body aches -- Leukocytosis- resolved -- Blood and urine cultures no growth to date, follow cultures --follow up in [facility] clinic  2. Nausea, vomiting, diarrhea - Resolved  3. Hematuria and flank pain - No further flank pain. Outpatient follow up for hematuria. -- Creatinine stable -- recommendation for outpatient follow-up.  4. T2DM Patient with history of T2DM managed with glipizide and metformin. Last A1c 7.6 on 05/04/2016. --hold metformin and glipizide --SSI with meals  5. PAF Patient with history of PAF on chronic Xarelto therapy and rate controlled with sotalol 40mg  BID, currently in sinus rhythm --cardiac monitoring --continue xarelto 20mg  daily and sotalol 40mg  BID  6. CAD Patient with history of CAD s/p stent in 2005 and 2013. Patient managed with pravastatin --continue pravastatin 40mg   7. HTN Patient with history of hypertension controlled with sotalol 40mg  BID, imdur 30mg  daily, and spironolactone 25mg  daily. BP controlled on admission. --continue home regimen and monitor BP  8. Oral candidiasis White plaques consistent with oral candidiasis located on the superior hard  palate. -- Magic mouthwash  Dispo: Discharge today  The patient does have a current PCP (Jessica Young Berry, DO) and does need an [facility] hospital follow-up appointment after discharge.  The patient does not have transportation limitations that hinder transportation to clinic appointments.   LOS: 3 days   Maryellen Pile, MD IMTS PGY-1 [phone] 08/28/2016, 1:09 PM

## 2016-08-29 NOTE — Discharge Summary (Signed)
Name: [name] MRN: [id number] DOB: [birth year] 60 y.o. PCP: Tyrone Party, DO  Date of Admission: 08/25/2016 10:57 AM Date of Discharge: 08/29/2016 Attending Physician: No att. providers found  Discharge Diagnosis: 1. Community-acquired pneumonia 2. Gastroenteritis 3. Hematuria  Discharge Medications:   Medication List    STOP taking these medications   cephALEXin 500 MG capsule Commonly known as:  KEFLEX   ketorolac 0.4 % Soln Commonly known as:  ACULAR   polyvinyl alcohol 1.4 % ophthalmic solution Commonly known as:  LIQUIFILM TEARS     TAKE these medications   acetaminophen 500 MG tablet Commonly known as:  TYLENOL Take 1 tablet (500 mg total) by mouth every 8 (eight) hours as needed for fever. What changed:  how much to take  reasons to take this   aspirin EC 81 MG tablet Take 1 tablet (81 mg total) by mouth daily.   cefUROXime 500 MG tablet Commonly known as:  CEFTIN Take 1 tablet (500 mg total) by mouth 2 (two) times daily with a meal.   gabapentin 300 MG capsule Commonly known as:  NEURONTIN TAKE ONE CAPSULE BY MOUTH THREE TIMES DAILY What changed:  See the new instructions.   glipiZIDE 10 MG tablet Commonly known as:  GLUCOTROL TAKE ONE TABLET BY MOUTH ONCE DAILY   isosorbide mononitrate 30 MG 24 hr tablet Commonly known as:  IMDUR TAKE ONE TABLET BY MOUTH ONCE DAILY What changed:  See the new instructions.   metFORMIN 1000 MG tablet Commonly known as:  GLUCOPHAGE TAKE ONE TABLET BY MOUTH TWICE DAILY WITH MEALS   morphine 15 MG tablet Commonly known as:  MSIR Take 15 mg by mouth every 4 (four) hours as needed. for pain   nitroGLYCERIN 0.4 MG SL tablet Commonly known as:  NITROSTAT Place 1 tablet (0.4 mg total) under the tongue every 5 (five) minutes as needed for chest pain. What changed:  See the new instructions.   pravastatin 40 MG tablet Commonly known as:  PRAVACHOL Take 1 tablet (40 mg total) by mouth daily.     rivaroxaban 20 MG Tabs tablet Commonly known as:  XARELTO Take 1 tablet (20 mg total) by mouth daily.   sotalol 80 MG tablet Commonly known as:  BETAPACE Take 0.5 tablets (40 mg total) by mouth 2 (two) times daily. What changed:  See the new instructions.   spironolactone 25 MG tablet Commonly known as:  ALDACTONE Take 25 mg by mouth daily.   triamcinolone lotion 0.1 % Commonly known as:  KENALOG Apply topically 2 (two) times daily. What changed:  how much to take  when to take this  reasons to take this   VOLTAREN 1 % Gel Generic drug:  diclofenac sodium Apply 2 g topically 4 (four) times daily. What changed:  when to take this  reasons to take this       Disposition and follow-up:   [name] was discharged from [facility] in Good condition.  At the hospital follow up visit please address:  1.  Please ensure the patient has completed his course of antibiotics. The patient had hematuria during his hospital stay without a clear etiology. He may benefit from urology referral. Please see below under #3.  2.  Labs / imaging needed at time of follow-up: Urinalysis  3.  Pending labs/ test needing follow-up: None  Follow-up Appointments: Follow-up Information    Boyd Kerbs, DO. Schedule an appointment as soon as possible for a  visit today.   Specialty:  Internal Medicine Contact information: [place] [address] [place] Hospital Course by problem list:  1. Community-acquired pneumonia The patient presented to the [facility] emergency department on 08/25/2016 with a chief complaint of fevers, headache, body aches, shortness of breath with a productive cough and diarrhea. In the emergency department the patient was febrile to 100.6, HR 101, RR 29, BP 143/81 setting 100 percent on room air. Labs were significant for leukocytosis with white blood cell count of 28 and venous lactic acid of 1.97.  Urinalysis demonstrated too many red blood cells to count. Lipase and troponin were negative. Patient had a chest x-ray which demonstrated right upper lobe pneumonia. He was given 2.5 L normal saline fluid and started azithromycin and ceftriaxone for community-acquired pneumonia complicated by sepsis. He was then transferred to the [facility] for admission to the internal medicine teaching service for further workup and evaluation.  Once admitted the patient defervesced and his vitals improved to within normal limits. He had blood cultures and urine culture obtained in the emergency department which demonstrated no growth to date. He was continued on IV azithromycin and ceftriaxone for 24 hours and then de-escalated to oral therapy. His oral therapy regimen consisted of azithromycin and cefuroxime. He improved clinically and he was transferred from an [facility] bed to normal floor for an additional day of observation on oral therapy. Legionella and strep pneumo urine antigens were both negative. By 08/28/2016 the patient was afebrile, hemodynamicly stable and on appropriate oral antibiotics. He will be discharged on cefuroxime 500 mg twice a day plus azithromycin 500 mg twice a day.  2. Gastroenteritis At the time of presentation the patient endorsed a one-week history of abdominal pain associated with diarrhea. He said he had multiple loose stools each day. The most likely etiology for the patient's gastroenteritis is related to his pulmonary symptoms and infection. His gastroenteritis medically improved with the treatment of his community-acquired pneumonia. At the time of discharge the patient stated that his symptoms had almost entirely resolved.  3. Hematuria Patient's urinalysis on time of presentation demonstrated large hemoglobin. Microscopic examination of the specimen demonstrated too numerous to count red blood cells. The patient denied any urinary symptoms. A repeat urinalysis with reflexive  microscopy was performed on 08/26/2016 which demonstrated large hemoglobin. Microscopy from this sample demonstrated no red blood cells seen. Patient was complaining of diffuse myalgias and had an elevated creatinine kinase during his admission. The hematuria from the urinalysis may be falsely elevated secondary to myoglobin release from potential myopathy. The etiology of the patient's hematuria was not fully elucidated during his hospitalization. He may benefit from urology follow-up given his long tobacco abuse history. At the time of discharge the patient's most recent microscopic urinalysis demonstrated 0-5 red blood cells per high-powered field and he was complaining of no urinary symptoms. Please discuss with the patient the potential for urology follow-up for repeat urinalysis in the outpatient setting. Discharge Vitals:    BP 133/76 (BP Location: Right Arm)   Pulse 66   Temp 99.1 F (37.3 C) (Oral)   Resp (!) 23   Ht 6\' 3"  (1.905 m)   Wt 263 lb 14.4 oz (119.7 kg)   SpO2 97%   BMI 32.99 kg/m   Pertinent Labs, Studies, and Procedures:  1. Chest x-ray-right upper lobe pneumonia  Discharge Instructions: Discharge Instructions    Call MD for:  difficulty breathing, headache or visual disturbances    Complete by:  As directed    Call MD for:  temperature >100.4    Complete by:  As directed    Diet - low sodium heart healthy    Complete by:  As directed    Increase activity slowly    Complete by:  As directed       Signed: Ophelia Shoulder, MD 08/29/2016, 10:57 AM   Pager: [phone]

## 2016-08-30 LAB — CULTURE, BLOOD (ROUTINE X 2)
CULTURE: NO GROWTH
CULTURE: NO GROWTH

## 2016-09-07 ENCOUNTER — Telehealth: Payer: Self-pay

## 2016-09-09 ENCOUNTER — Other Ambulatory Visit: Payer: Self-pay | Admitting: Internal Medicine

## 2016-09-10 ENCOUNTER — Ambulatory Visit (INDEPENDENT_AMBULATORY_CARE_PROVIDER_SITE_OTHER): Payer: BLUE CROSS/BLUE SHIELD | Admitting: Internal Medicine

## 2016-09-10 ENCOUNTER — Encounter: Payer: Self-pay | Admitting: Internal Medicine

## 2016-09-10 VITALS — BP 119/70 | HR 65 | Temp 98.0°F | Ht 75.0 in | Wt 254.8 lb

## 2016-09-10 DIAGNOSIS — B370 Candidal stomatitis: Secondary | ICD-10-CM | POA: Insufficient documentation

## 2016-09-10 DIAGNOSIS — F17210 Nicotine dependence, cigarettes, uncomplicated: Secondary | ICD-10-CM

## 2016-09-10 DIAGNOSIS — M5416 Radiculopathy, lumbar region: Secondary | ICD-10-CM

## 2016-09-10 DIAGNOSIS — I480 Paroxysmal atrial fibrillation: Secondary | ICD-10-CM

## 2016-09-10 DIAGNOSIS — R310 Gross hematuria: Secondary | ICD-10-CM

## 2016-09-10 DIAGNOSIS — Z7901 Long term (current) use of anticoagulants: Secondary | ICD-10-CM

## 2016-09-10 DIAGNOSIS — Z23 Encounter for immunization: Secondary | ICD-10-CM | POA: Diagnosis not present

## 2016-09-10 DIAGNOSIS — Z8719 Personal history of other diseases of the digestive system: Secondary | ICD-10-CM | POA: Diagnosis not present

## 2016-09-10 DIAGNOSIS — Z8701 Personal history of pneumonia (recurrent): Secondary | ICD-10-CM

## 2016-09-10 DIAGNOSIS — Z09 Encounter for follow-up examination after completed treatment for conditions other than malignant neoplasm: Secondary | ICD-10-CM | POA: Diagnosis not present

## 2016-09-10 DIAGNOSIS — G8929 Other chronic pain: Secondary | ICD-10-CM

## 2016-09-10 DIAGNOSIS — R197 Diarrhea, unspecified: Secondary | ICD-10-CM

## 2016-09-10 MED ORDER — GABAPENTIN 300 MG PO CAPS: 600.0000 mg | ORAL_CAPSULE | Freq: Three times a day (TID) | ORAL | 3 refills | Status: DC

## 2016-09-10 NOTE — Assessment & Plan Note (Signed)
Patient with oral candidiasis during hospitalization; treated with magic mouthwash. No symptoms now.  --resolved

## 2016-09-10 NOTE — Patient Instructions (Addendum)
I'm glad you're feeling better!  We will call you with the results of your urine test and what next actions we need to take.   You got your flu shot today, and you won't need another pneumonia shot until you're 66.  For your back pain:  --we increased the gabapentin dose to 600mg  three times a day; with your left over pills, you can take two of the 300mg  pills three times a day until you run out, then we will fill the new prescription --Use heat on your back --continue doing the stretches and exercises that you learned in physical therapy

## 2016-09-10 NOTE — Assessment & Plan Note (Signed)
Patient with chronic lower back pain with right leg radiculopathy. Patient has been with PT on and off for 3 years and is compliant with continuing stretching and exercises he has learned. He states he only used heat once, and it did help. He has not had much change in pain with Gabapentin 300mg  TID.  Plan: --patient receiving injection with pain management clinic next week --increase gabapentin to 600mg  TID, advised about increased sedation --encouraged use of heat, stretching and strengthening exercises

## 2016-09-10 NOTE — Assessment & Plan Note (Signed)
Patient with 3 episodes per day diarrhea since discharge on 9/30, without fever, chills, abdominal pain, mucous, blood or melena. Today, he has had no diarrhea. In setting of CAP treated with antibiotics, symptoms consistent with antibiotic associated diarrhea, another possibility is post-infectious diarrhea. No concern for C diff at this time.

## 2016-09-10 NOTE — Progress Notes (Signed)
   CC: hospital follow up  HPI:  [name] is a 60 y.o. with a PMH of PAF, T2DM, CAD s/p stents in 2005 and 2013, and HTN presenting for a hospital f/u for community acquired pneumonia and gastroenteritis.  CAP: Patient was treated with cefuroxime and azithromycin and did not require further antibiotic treatment past day of discharge on 9/30. Pt has had about 3 episodes a day of diarrhea without fever or abdominal pain. He denies hematochezia or melena. Today, he has not had any diarrhea. No fevers, chills, cough.   Hematuria: On initial admission UA, patient had hematuria with too many RBC's to count on microscopy. Repeated UA's revealed Hgb. 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 Admitting team suggested urology evaluation outpatient. Patient continues to have hematuria, without dysuria, clots, or flank pain.  Oral Candidiasis: Patient with oral candidiasis in the hospital, treated with magic mouthwash. Resolved, and patient no longer having mouth pain.  Please see problem based Assessment and Plan for status of patients chronic conditions.  Past Medical History:  Diagnosis Date  . Atrial arrhythmia   . Cellulitis of knee, right 06/06/12  . Chronic otitis externa 06/06/12   C Multiple trials with antibiocs and antifungal. Cultures were positive for Pseudomonas. Follow up with ENT on 06/14/12    . CORONARY ARTERY DISEASE 11/25/2006   Cardiac cath by Dr Terrence Dupont 11/2003 LV showed good LV systolic function, EF of 0000000. Left main was patent. LAD has 20-30% mid stenosis. Diagonal 1 and diagonal 2 were patent. Left circumflex was patent. OM1 was less than 0.5 mm which was diffusely diseased. OM2 has 20% ostial stenosis which was patent which  was moderate size. OM3 was patent at prior PTCA and stented site. RCA has 20-30% proximal   . Hypertension   . NSTEMI (non-ST elevated myocardial infarction) ([place]) 08/14/2012   S/p Successful PTCA to proximal OM-3       . TOBACCO ABUSE 11/25/2006  . Type II diabetes mellitus ([place])     Review of Systems:   Review of Systems  Constitutional: Negative for chills and fever.  Respiratory: Negative for cough, hemoptysis, sputum production, shortness of breath and wheezing.   Cardiovascular: Negative for chest pain, palpitations and leg swelling.  Gastrointestinal: Positive for diarrhea (3 episodes a day, none today). Negative for abdominal pain, blood in stool, constipation, melena, nausea and vomiting.  Genitourinary: Positive for hematuria. Negative for dysuria, flank pain, frequency and urgency.  Musculoskeletal: Positive for back pain (with radiation to R leg) and joint pain.  Neurological: Negative for tingling, sensory change and focal weakness.    Physical Exam:  Vitals:   09/10/16 1528  BP: 119/70  Pulse: 65  Temp: 98 F (36.7 C)  TempSrc: Oral  SpO2: 100%  Weight: 254 lb 12.8 oz (115.6 kg)  Height: 6\' 3"  (1.905 m)   Physical Exam  Constitutional: NAD, pleasant ENT: no oral candidiasis observed, mucous membranes moist, no oropharyngeal exudate CV: RRR, no murmurs, rubs or gallops appreciated, 1+ pitting edema bil LE, 2+ pulses throughout Resp: CTAB, no increased work of breathing, no wheezing or crackles appreciated Abd: soft, +BS, NDNT MSK: no spinal tenderness, tenderness over bil SI joints, pain in R lower back with resisted flexion, strength and sensation intact in bil LE   Assessment & Plan:   See Encounters Tab for problem based charting.   Patient seen with Dr. Carmel Sacramento, MD Internal Medicine PGY1

## 2016-09-10 NOTE — Assessment & Plan Note (Addendum)
Patient states he has had on-off hematuria for years though on admission denied history of it. On admission to hospital 08/25/2016, patient had gross hematuria and microscopy was positive for too many RBC's to count; repeat testing the following day was positive for only hemoglobin. 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 He is also on Xarelto therapy for PAF which could be causing this hematuria. We will pursue further evaluation to determine renal vs bladder origin.  Plan: --UA with microcopy --if RBC dysmorphology, will do renal U/S and refer to nephro --if no RBC dysmorphology, will likely refer to urology for direct visualization with cystoscopy  Addendum: UA + for RBC's no dysmorphology noted; will refer to urology for direct visualization via cystoscopy; patient contacted, agrees with plan and has no questions at this time.

## 2016-09-11 LAB — URINALYSIS, COMPLETE
Bilirubin, UA: NEGATIVE
GLUCOSE, UA: NEGATIVE
Ketones, UA: NEGATIVE
Leukocytes, UA: NEGATIVE
Nitrite, UA: NEGATIVE
PH UA: 5.5 (ref 5.0–7.5)
Specific Gravity, UA: 1.024 (ref 1.005–1.030)
UUROB: 0.2 mg/dL (ref 0.2–1.0)

## 2016-09-11 LAB — MICROSCOPIC EXAMINATION
Bacteria, UA: NONE SEEN
Casts: NONE SEEN /lpf
Epithelial Cells (non renal): NONE SEEN /hpf (ref 0–10)

## 2016-09-13 ENCOUNTER — Other Ambulatory Visit: Payer: Self-pay | Admitting: *Deleted

## 2016-09-13 MED ORDER — ISOSORBIDE MONONITRATE ER 30 MG PO TB24: 30.0000 mg | ORAL_TABLET | Freq: Every day | ORAL | 0 refills | Status: DC

## 2016-09-13 NOTE — Addendum Note (Signed)
Addended by: Thurnell Lose on: 09/13/2016 05:57 PM   Modules accepted: Orders

## 2016-09-13 NOTE — Progress Notes (Signed)
Internal Medicine Clinic Attending  I saw and evaluated the patient.  I personally confirmed the key portions of the history and exam documented by Dr. Svalina and I reviewed pertinent patient test results.  The assessment, diagnosis, and plan were formulated together and I agree with the documentation in the resident's note.  

## 2016-10-05 ENCOUNTER — Other Ambulatory Visit: Payer: Self-pay | Admitting: Internal Medicine

## 2016-10-06 NOTE — Telephone Encounter (Signed)
I don't believe I have seen this patient in clinic. I will do another 3 month supply of IMDUR. I would like to see him at least once before I give 6 months to a year supply of the medication. I see that he has appointment in february.  I will reassess for refills then.  Thanks!

## 2016-10-06 NOTE — Telephone Encounter (Signed)
Pt received 90-day supply of Imdur on  10/16 with no additional refills.  Pharmacy is requesting additional refills be placed on pt's profile if appropriate, please advise.Regenia Skeeter, Darlene Cassady11/8/201711:02 AM

## 2016-10-07 ENCOUNTER — Telehealth: Payer: Self-pay

## 2016-10-07 ENCOUNTER — Encounter: Payer: BLUE CROSS/BLUE SHIELD | Admitting: Internal Medicine

## 2016-10-10 NOTE — Telephone Encounter (Signed)
Unable to reach patient.

## 2016-11-04 ENCOUNTER — Other Ambulatory Visit: Payer: Self-pay | Admitting: Internal Medicine

## 2016-11-19 ENCOUNTER — Other Ambulatory Visit: Payer: Self-pay | Admitting: Internal Medicine

## 2016-11-19 DIAGNOSIS — E119 Type 2 diabetes mellitus without complications: Secondary | ICD-10-CM

## 2016-11-23 ENCOUNTER — Other Ambulatory Visit: Payer: Self-pay | Admitting: Internal Medicine

## 2016-11-23 DIAGNOSIS — M5416 Radiculopathy, lumbar region: Principal | ICD-10-CM

## 2016-11-23 DIAGNOSIS — G8929 Other chronic pain: Secondary | ICD-10-CM

## 2016-12-03 ENCOUNTER — Ambulatory Visit (INDEPENDENT_AMBULATORY_CARE_PROVIDER_SITE_OTHER): Payer: BLUE CROSS/BLUE SHIELD | Admitting: Internal Medicine

## 2016-12-03 ENCOUNTER — Encounter (INDEPENDENT_AMBULATORY_CARE_PROVIDER_SITE_OTHER): Payer: Self-pay

## 2016-12-03 VITALS — BP 111/72 | HR 87 | Temp 97.7°F | Wt 258.4 lb

## 2016-12-03 DIAGNOSIS — R310 Gross hematuria: Secondary | ICD-10-CM | POA: Diagnosis not present

## 2016-12-03 DIAGNOSIS — F17210 Nicotine dependence, cigarettes, uncomplicated: Secondary | ICD-10-CM | POA: Diagnosis not present

## 2016-12-03 DIAGNOSIS — Z23 Encounter for immunization: Secondary | ICD-10-CM

## 2016-12-03 DIAGNOSIS — I480 Paroxysmal atrial fibrillation: Secondary | ICD-10-CM

## 2016-12-03 DIAGNOSIS — Z7901 Long term (current) use of anticoagulants: Secondary | ICD-10-CM | POA: Diagnosis not present

## 2016-12-03 LAB — POCT URINALYSIS DIPSTICK
BILIRUBIN UA: NEGATIVE
GLUCOSE UA: NEGATIVE
KETONES UA: NEGATIVE
Leukocytes, UA: NEGATIVE
Nitrite, UA: NEGATIVE
Urobilinogen, UA: 0.2
pH, UA: 5.5

## 2016-12-03 NOTE — Progress Notes (Signed)
CC: blood in urine HPI: [name] is a 61 y.o. male with a h/o of PAF, T2DM, CAD s/p stents in 2005 and 2013, and HTN  who presents for evaluation of hematuria.  Please see Problem-based charting for HPI and the status of patient's chronic medical conditions.  Past Medical History:  Diagnosis Date  . Atrial arrhythmia   . CAP (community acquired pneumonia)   . Cellulitis of knee, right 06/06/12  . Chronic otitis externa 06/06/12   C Multiple trials with antibiocs and antifungal. Cultures were positive for Pseudomonas. Follow up with ENT on 06/14/12    . CORONARY ARTERY DISEASE 11/25/2006   Cardiac cath by Dr Terrence Dupont 11/2003 LV showed good LV systolic function, EF of 0000000. Left main was patent. LAD has 20-30% mid stenosis. Diagonal 1 and diagonal 2 were patent. Left circumflex was patent. OM1 was less than 0.5 mm which was diffusely diseased. OM2 has 20% ostial stenosis which was patent which  was moderate size. OM3 was patent at prior PTCA and stented site. RCA has 20-30% proximal   . Hypertension   . NSTEMI (non-ST elevated myocardial infarction) ([place]) 08/14/2012   S/p Successful PTCA to proximal OM-3     . TOBACCO ABUSE 11/25/2006  . Type II diabetes mellitus ([place])     Social Hx: Social History  Substance Use Topics  . Smoking status: Current Some Day Smoker    Packs/day: 0.20    Years: 12.00    Types: Cigarettes    Last attempt to quit: 11/29/2014  . Smokeless tobacco: Never Used  . Alcohol use 0.0 oz/week     Comment: socially   Review of Systems: ROS in HPI. Otherwise: Review of Systems  Constitutional: Negative for chills, fever and weight loss.  Respiratory: Negative for cough and shortness of breath.   Cardiovascular: Negative for chest pain and leg swelling.  Gastrointestinal: Negative for abdominal pain, constipation, diarrhea, nausea and vomiting.  Genitourinary: Positive for hematuria. Negative for dysuria, flank pain, frequency and urgency.    Physical  Exam: Vitals:   12/03/16 1445  BP: 111/72  Pulse: 87  Temp: 97.7 F (36.5 C)  TempSrc: Oral  SpO2: 99%  Weight: 258 lb 6.4 oz (117.2 kg)   Physical Exam  Constitutional: He is cooperative. No distress.  Cardiovascular: Normal rate, regular rhythm, normal heart sounds and normal pulses.  Exam reveals no gallop.   No murmur heard. Pulmonary/Chest: Effort normal and breath sounds normal. No respiratory distress. He has no wheezes. He has no rhonchi. He has no rales.  Abdominal: Soft. Bowel sounds are normal. There is no tenderness.  Musculoskeletal: He exhibits no edema.  Skin: Skin is intact.    Assessment & Plan:  See encounters tab for problem based medical decision making. Patient discussed with Dr. Angelia Mould  Hematuria, gross Patient was last seen for gross hematuria on 10/16 in the clinic. At that time the patient had dysmorphic red cells on microscopic urinalysis. Referral was made to urology for direct visualization with cystoscopy. Patient reports that she had cystoscopy and full evaluation by urology who did not find any abnormalities suggestive of carcinoma or other causes of bleeding. Patient reports that urology recommended that his hematuria was likely a result of his continued Xarelto use. Last blood counts were 11.8 while he was in the hospital in September 2017. We'll plan to recheck his blood counts today. Microscopic urinalysis shows large amounts of blood, but no gross hematuria  Patient remains on Xarelto  anticoagulation for paroxysmal A. fib. He denies any pain with urination, dysuria, urinary frequency. He has no fevers, chills, or abdominal pain.  The patient reports that she has only episodic gross hematuria roughly one time a week when he has self-limited bright red urine. He denies any fatigue, shortness of breath and has no pallor on exam. We will plan to continue with anticoagulation given his risk for stroke with A. fib and plan to follow-up his blood counts  and UA in 3 months.  We'll plan to get records from urology in order to follow-up her with any recognitions they have. Return precautions discussed.  Need for vaccination with 13-polyvalent pneumococcal conjugate vaccine Pneumococcal vaccine indicated in patient with diabetes, Prevnar administered today.   Signed: Holley Raring, MD 12/03/2016, 3:26 PM  Pager: [phone]

## 2016-12-03 NOTE — Progress Notes (Signed)
Internal Medicine Clinic Attending  Case discussed with Dr. Gay Filler at the time of the visit.  We reviewed the resident's history and exam and pertinent patient test results.  I agree with the assessment, diagnosis, and plan of care documented in the resident's note. We will need to obtain the urology notes as Xarelto use alone would not explain his gross hematuria if he has had an unremarkable cystoscopy he would likely need imaging of his upper urinary tract.

## 2016-12-03 NOTE — Assessment & Plan Note (Addendum)
Pneumococcal vaccine indicated in patient with diabetes, Prevnar administered today.

## 2016-12-03 NOTE — Patient Instructions (Addendum)
We will check you blood counts today to make sure you are not anemic from the bleeding in your bladder. If you blood counts are normal, we will not make any changes today but plan to follow up with blood work in 3 months.

## 2016-12-03 NOTE — Assessment & Plan Note (Addendum)
Patient was last seen for gross hematuria on 10/16 in the clinic. At that time the patient had dysmorphic red cells on microscopic urinalysis. Referral was made to urology for direct visualization with cystoscopy. Patient reports that she had cystoscopy and full evaluation by urology who did not find any abnormalities suggestive of carcinoma or other causes of bleeding. Patient reports that urology recommended that his hematuria was likely a result of his continued Xarelto use. Last blood counts were 11.8 while he was in the hospital in September 2017. We'll plan to recheck his blood counts today. Microscopic urinalysis shows large amounts of blood, but no gross hematuria  Patient remains on Xarelto anticoagulation for paroxysmal A. fib. He denies any pain with urination, dysuria, urinary frequency. He has no fevers, chills, or abdominal pain.  The patient reports that she has only episodic gross hematuria roughly one time a week when he has self-limited bright red urine. He denies any fatigue, shortness of breath and has no pallor on exam. We will plan to continue with anticoagulation given his risk for stroke with A. fib and plan to follow-up his blood counts and UA in 3 months.  We'll plan to get records from urology in order to follow-up her with any recognitions they have. Return precautions discussed.

## 2016-12-04 LAB — URINALYSIS, ROUTINE W REFLEX MICROSCOPIC
Bilirubin, UA: NEGATIVE
GLUCOSE, UA: NEGATIVE
KETONES UA: NEGATIVE
LEUKOCYTES UA: NEGATIVE
Nitrite, UA: NEGATIVE
SPEC GRAV UA: 1.025 (ref 1.005–1.030)
Urobilinogen, Ur: 0.2 mg/dL (ref 0.2–1.0)
pH, UA: 5 (ref 5.0–7.5)

## 2016-12-04 LAB — MICROSCOPIC EXAMINATION: Casts: NONE SEEN /lpf

## 2016-12-04 LAB — CBC
Hematocrit: 40.3 % (ref 37.5–51.0)
Hemoglobin: 13.8 g/dL (ref 13.0–17.7)
MCH: 28.6 pg (ref 26.6–33.0)
MCHC: 34.2 g/dL (ref 31.5–35.7)
MCV: 83 fL (ref 79–97)
PLATELETS: 188 10*3/uL (ref 150–379)
RBC: 4.83 x10E6/uL (ref 4.14–5.80)
RDW: 16 % — AB (ref 12.3–15.4)
WBC: 8 10*3/uL (ref 3.4–10.8)

## 2016-12-12 ENCOUNTER — Other Ambulatory Visit: Payer: Self-pay | Admitting: Internal Medicine

## 2016-12-12 DIAGNOSIS — I4891 Unspecified atrial fibrillation: Secondary | ICD-10-CM

## 2017-01-10 ENCOUNTER — Other Ambulatory Visit: Payer: Self-pay | Admitting: Internal Medicine

## 2017-01-13 ENCOUNTER — Encounter: Payer: BLUE CROSS/BLUE SHIELD | Admitting: Internal Medicine

## 2017-01-18 ENCOUNTER — Encounter: Payer: Self-pay | Admitting: Internal Medicine

## 2017-01-18 ENCOUNTER — Other Ambulatory Visit: Payer: Self-pay | Admitting: Physical Medicine and Rehabilitation

## 2017-01-18 DIAGNOSIS — M545 Low back pain: Secondary | ICD-10-CM

## 2017-01-23 ENCOUNTER — Ambulatory Visit
Admission: RE | Admit: 2017-01-23 | Discharge: 2017-01-23 | Disposition: A | Discharge status: Home / Self Care | Payer: BLUE CROSS/BLUE SHIELD | Source: Ambulatory Visit | Attending: Physical Medicine and Rehabilitation | Admitting: Physical Medicine and Rehabilitation

## 2017-01-23 DIAGNOSIS — M545 Low back pain: Secondary | ICD-10-CM

## 2017-02-13 ENCOUNTER — Other Ambulatory Visit: Payer: Self-pay | Admitting: Internal Medicine

## 2017-02-13 DIAGNOSIS — E785 Hyperlipidemia, unspecified: Secondary | ICD-10-CM

## 2017-02-13 DIAGNOSIS — E119 Type 2 diabetes mellitus without complications: Secondary | ICD-10-CM

## 2017-02-16 IMAGING — CR DG CHEST 2V
2 series · 2 of 2 positions shown · non-contrast
Comparison: 04/09/2015

CLINICAL DATA: Fever and shortness of breath.  Chest pain.

EXAM:
CHEST  2 VIEW

[w chest lat (1 of 2)]
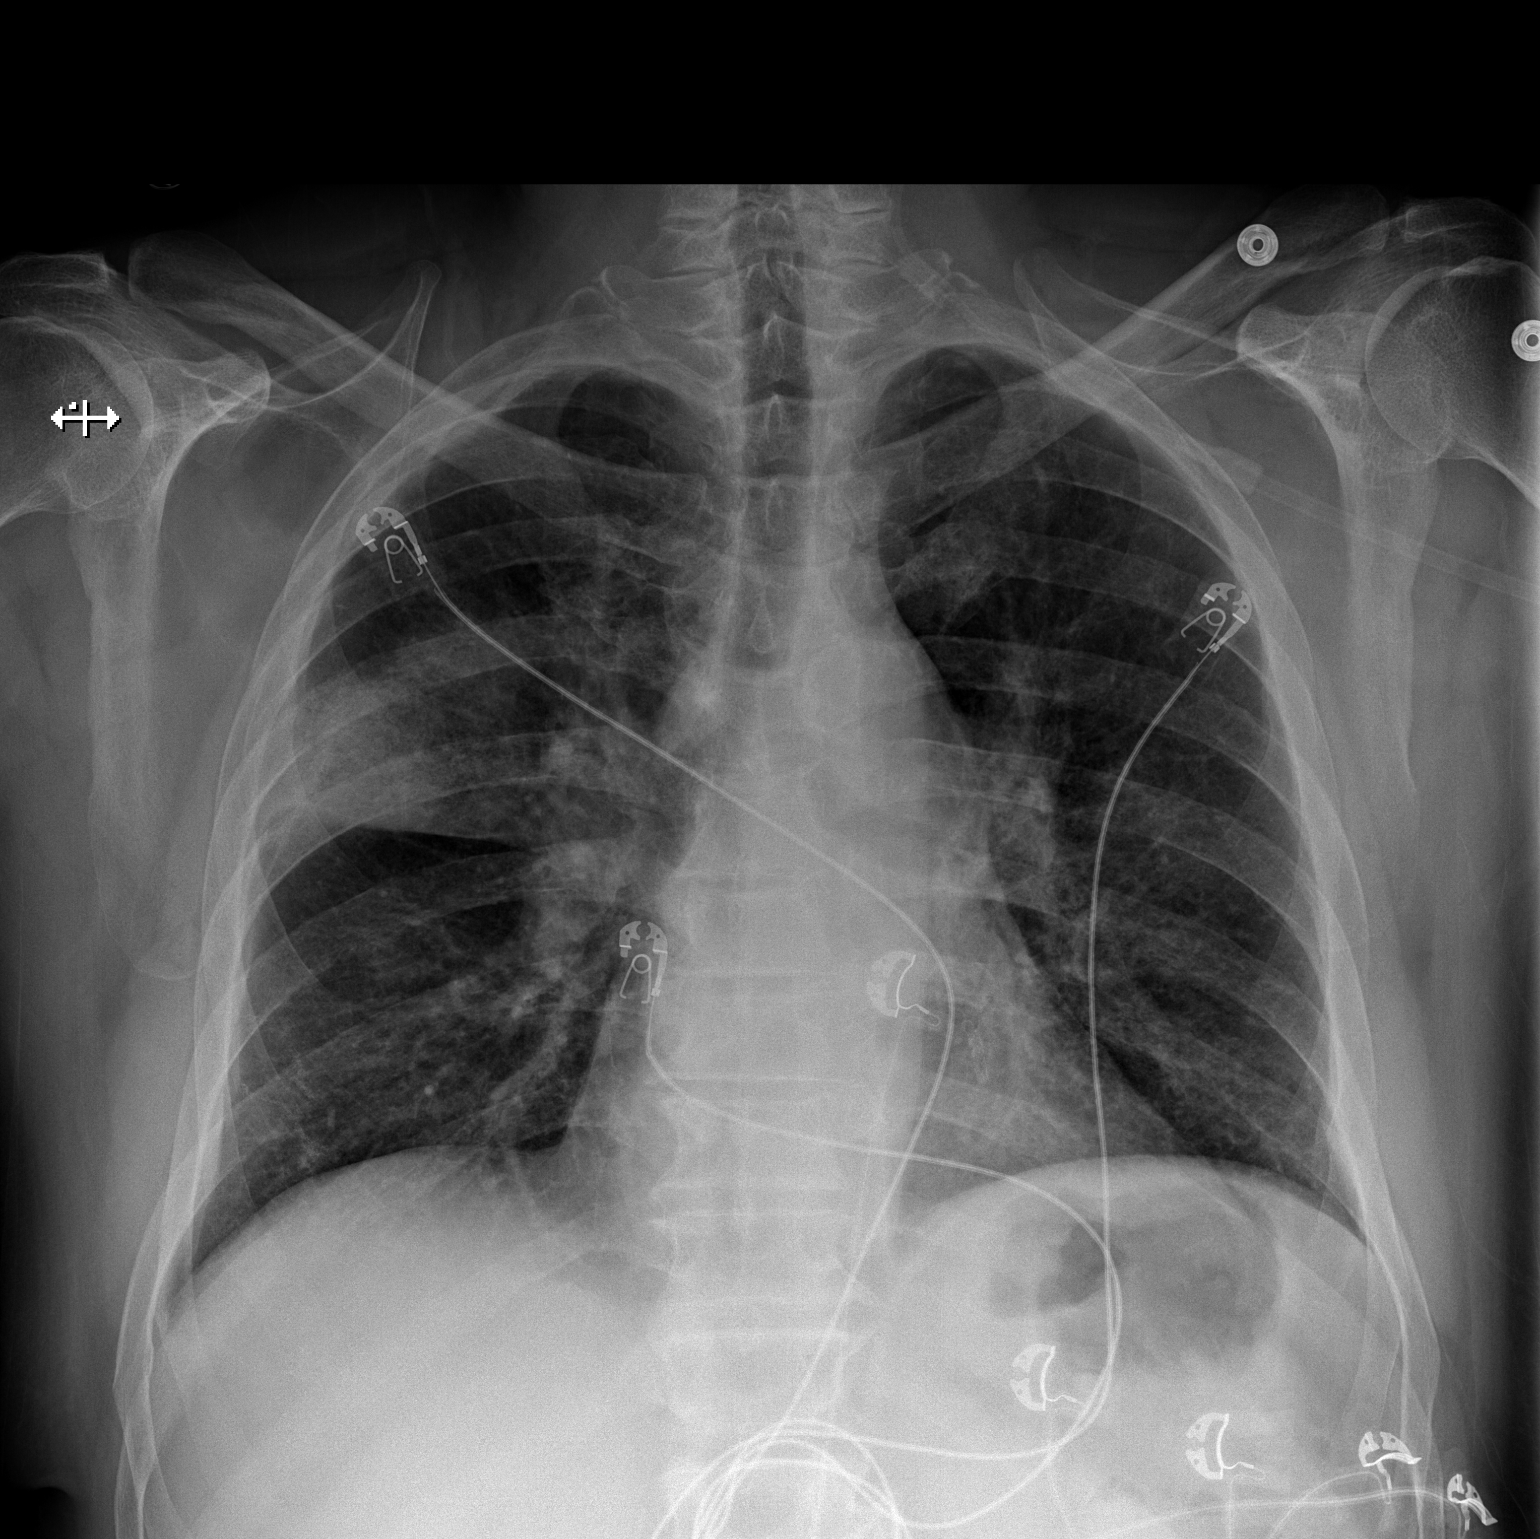

[w chest lat (2 of 2)]
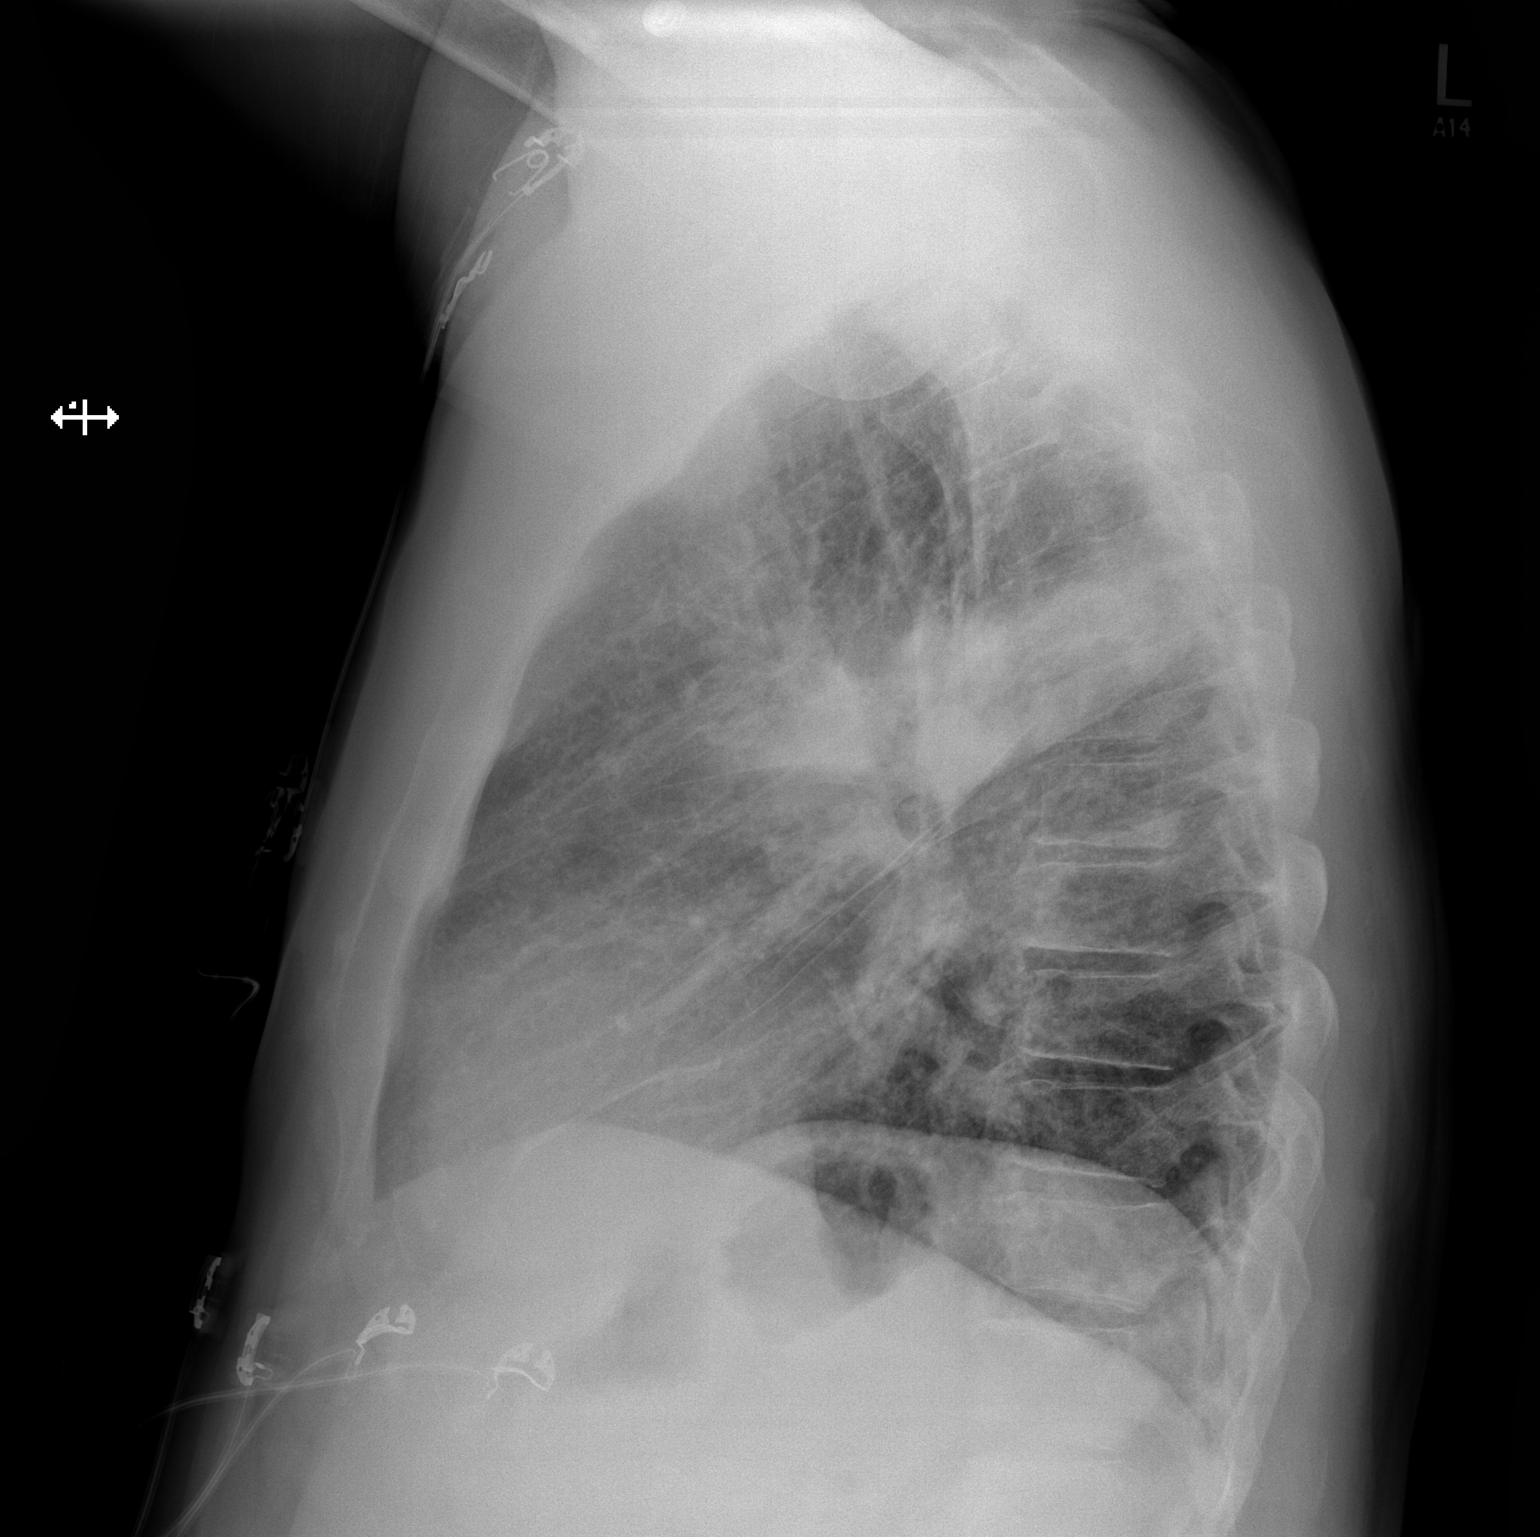

[2 of 2 positions shown; findings below may reference images not displayed]

FINDINGS: There is a consolidative infiltrate in the posterior aspect of the
right upper lobe. Popping peribronchial thickening. No effusions.

Heart size and vascularity are normal. Coronary artery stent in
place. Chronic fullness of the aortopulmonary window probably due to
enlargement of the main pulmonary artery.
IMPRESSION: Right upper lobe pneumonia.  Bilateral bronchitic changes.

## 2017-03-02 ENCOUNTER — Ambulatory Visit: Payer: BLUE CROSS/BLUE SHIELD | Attending: Physical Medicine and Rehabilitation | Admitting: Physical Therapy

## 2017-03-02 DIAGNOSIS — M6281 Muscle weakness (generalized): Secondary | ICD-10-CM | POA: Diagnosis not present

## 2017-03-02 DIAGNOSIS — M5416 Radiculopathy, lumbar region: Secondary | ICD-10-CM | POA: Diagnosis present

## 2017-03-02 DIAGNOSIS — G8929 Other chronic pain: Secondary | ICD-10-CM | POA: Insufficient documentation

## 2017-03-02 DIAGNOSIS — M5441 Lumbago with sciatica, right side: Secondary | ICD-10-CM | POA: Diagnosis present

## 2017-03-02 NOTE — Patient Instructions (Signed)
Posterior Pelvic Tilt Knee to chest Double knee to chest Supine march  Give from previous episode of PT

## 2017-03-02 NOTE — Therapy (Signed)
[place], [place], [address] Phone: [phone]   Fax:  [phone]  Physical Therapy Evaluation  Patient Details  Name: [name] MRN: [id number] Date of Birth: August 05, 1956 Referring Provider: Dr. Kirtland Bouchard   Encounter Date: 03/02/2017      PT End of Session - 03/02/17 1504    Visit Number 1   Number of Visits 12   Date for PT Re-Evaluation 04/13/17   PT Start Time 6962   PT Stop Time 1510   PT Time Calculation (min) 53 min   Activity Tolerance Patient tolerated treatment well   Behavior During Therapy [facility] for tasks assessed/performed      Past Medical History:  Diagnosis Date  . Atrial arrhythmia   . CAP (community acquired pneumonia)   . Cellulitis of knee, right 06/06/12  . Chronic otitis externa 06/06/12   C Multiple trials with antibiocs and antifungal. Cultures were positive for Pseudomonas. Follow up with ENT on 06/14/12    . CORONARY ARTERY DISEASE 11/25/2006   Cardiac cath by Dr Terrence Dupont 11/2003 LV showed good LV systolic function, EF of 95-28%. Left main was patent. LAD has 20-30% mid stenosis. Diagonal 1 and diagonal 2 were patent. Left circumflex was patent. OM1 was less than 0.5 mm which was diffusely diseased. OM2 has 20% ostial stenosis which was patent which  was moderate size. OM3 was patent at prior PTCA and stented site. RCA has 20-30% proximal   . Hypertension   . NSTEMI (non-ST elevated myocardial infarction) ([place]) 08/14/2012   S/p Successful PTCA to proximal OM-3     . TOBACCO ABUSE 11/25/2006  . Type II diabetes mellitus ([place])     Past Surgical History:  Procedure Laterality Date  . CORONARY ANGIOPLASTY WITH STENT PLACEMENT  ~ 2005   "1"  . CYSTECTOMY  1990's   LUA  . LEFT HEART CATHETERIZATION WITH CORONARY ANGIOGRAM N/A 08/15/2012   Procedure: LEFT HEART CATHETERIZATION WITH CORONARY ANGIOGRAM;  Surgeon: Clent Demark, MD;  Location: [place] CATH LAB;  Service: Cardiovascular;   Laterality: N/A;  . PERCUTANEOUS CORONARY STENT INTERVENTION (PCI-S)  08/15/2012   Procedure: PERCUTANEOUS CORONARY STENT INTERVENTION (PCI-S);  Surgeon: Clent Demark, MD;  Location: [facility] CATH LAB;  Service: Cardiovascular;;    There were no vitals filed for this visit.       Subjective Assessment - 03/02/17 1425    Subjective Pt presents with chronic Rt. sided low back and Rt. leg pain   Pt had a recent MRI.  He has been dealing with this pain for years.  Recently he noticed pain interfering with work.  Pt seeing Neurosurgeon in a couple weeks.  He would like to avoid surgery.  He has gotten away from his HEP.    Patient is accompained by: Family member   Limitations Standing;Lifting;Walking;House hold activities   How long can you sit comfortably? not limited    How long can you walk comfortably? works 4-5 hours then I sit and rest and it hits me    Diagnostic tests 12/2016: MRI showed lumbar spondylosis, anterolisthesis, and severe L sided foraminal stenosis   Patient Stated Goals relieve this pain   Currently in Pain? Yes   Pain Score 5    Pain Location Back   Pain Orientation Lower   Pain Descriptors / Indicators Aching   Pain Type Chronic pain   Pain Radiating Towards Rt. LE    Pain Onset More than a month ago  Pain Frequency Intermittent   Aggravating Factors  overactivity, carrying lifting, working    Pain Relieving Factors gentle movement    Effect of Pain on Daily Activities deals with alot of pain at times, not comfortable with mobilty, Rt. LE pain    Multiple Pain Sites Yes            [facility] PT Assessment - 03/02/17 1431      Assessment   Medical Diagnosis Lumbar spondylosis   Referring Provider Dr. Kirtland Bouchard    Onset Date/Surgical Date --  Chronic    Next MD Visit unsure   Prior Therapy Yes      Precautions   Precautions None     Restrictions   Weight Bearing Restrictions No     Balance Screen   Has the patient fallen in the past 6 months No   Has  the patient had a decrease in activity level because of a fear of falling?  No   Is the patient reluctant to leave their home because of a fear of falling?  No     Home Environment   Living Environment Private residence     Prior Function   Level of Independence Independent with basic ADLs;Independent with household mobility without device   Vocation Part time employment   Doctor, hospital, maintenance, power washing houses   Leisure Family, friends      Cognition   Overall Cognitive Status Within Functional Limits for tasks assessed     Observation/Other Assessments   Focus on Therapeutic Outcomes (FOTO)  30%     Sensation   Light Touch Appears Intact     Functional Tests   Functional tests Squat;Single leg stance     Squat   Comments poor hip hinge      Single Leg Stance   Comments 10 sec each leg      Posture/Postural Control   Posture/Postural Control Postural limitations   Postural Limitations Rounded Shoulders;Forward head   Posture Comments sits forward to relieve low back pain      AROM   Lumbar Flexion 25%   Lumbar Extension 50% pain    Lumbar - Right Rotation WFL pain   Lumbar - Left Rotation WFL pain      Strength   Right Hip Flexion 5/5   Left Hip Flexion 5/5   Right Knee Flexion 5/5   Right Knee Extension 5/5   Left Knee Flexion 5/5   Left Knee Extension 5/5     Flexibility   Hamstrings 50-55 deg      Palpation   Palpation comment NT                    [facility] Adult PT Treatment/Exercise - 03/02/17 1450      Self-Care   Self-Care Other Self-Care Comments   Other Self-Care Comments  arthritis, HEP      Lumbar Exercises: Stretches   Single Knee to Chest Stretch 3 reps;30 seconds   Double Knee to Chest Stretch 1 rep   Pelvic Tilt 5 reps     Traction   Type of Traction Lumbar   Min (lbs) 75   Max (lbs) 100   Hold Time 60   Rest Time 15   Time 15                PT Education - 03/02/17 1502    Education  provided Yes   Education Details PT/POC, HEP, options for treatment, anatomy, neurosurgery  Person(s) Educated Patient   Methods Explanation;Handout   Comprehension Verbalized understanding;Returned demonstration;Need further instruction          PT Short Term Goals - 03/02/17 1510      PT SHORT TERM GOAL #1   Title Pt will be independent with HEP    Time 3   Period Weeks   Status New     PT SHORT TERM GOAL #2   Title Pt will verbalize centralization of pain to low back   Time 3   Period Weeks   Status New           PT Long Term Goals - 03/02/17 1511      PT LONG TERM GOAL #1   Title Pt will be I with full HEP and do regularly for maximal recovery.    Time 6   Period Weeks   Status New     PT LONG TERM GOAL #2   Title Pt will be able to climb ladder for work activities with minimal back discomfort   Time 6   Period Weeks   Status New     PT LONG TERM GOAL #3   Title Pt will verbalize ability to incorporate stretches into day to avoid excessive pain in the evenings   Time 6   Period Weeks   Status New     PT LONG TERM GOAL #4   Title Pt will verbalize average pain <5/10 in low back to decrease effects of pain on daily activities   Time 6   Period Weeks   Status New               Plan - 03/02/17 1505    Clinical Impression Statement Pt with low complexity eval, familiar to this clinic for 2 previous episodes of care for similar back issues.  He cont to be limited in his ability to work, recover from physical activity.  He continues to need education on arthritis and its impact on his pain.  He is considering surgery.     Rehab Potential Good   PT Frequency 2x / week   PT Duration 6 weeks  if progressing    PT Treatment/Interventions ADLs/Self Care Home Management;Moist Heat;Traction;Therapeutic activities;Taping;Therapeutic exercise;Ultrasound;Manual techniques;Cryotherapy;Electrical Stimulation;Functional mobility training;Patient/family education    PT Next Visit Plan check HEP and develop further, repeat traction if favorable    PT Home Exercise Plan PPT, knee to chest, double knee to chest and lower ab    Consulted and Agree with Plan of Care Patient      Patient will benefit from skilled therapeutic intervention in order to improve the following deficits and impairments:  Decreased mobility, Decreased strength, Improper body mechanics, Impaired flexibility, Pain, Increased fascial restricitons, Difficulty walking  Visit Diagnosis: Muscle weakness (generalized)  Radiculopathy, lumbar region  Chronic bilateral low back pain with right-sided sciatica     Problem List Patient Active Problem List   Diagnosis Date Noted  . Need for vaccination with 13-polyvalent pneumococcal conjugate vaccine 12/03/2016  . Hematuria, gross 09/10/2016  . Oral candidiasis 09/10/2016  . Diarrhea 09/10/2016  . Sensation of foreign body in eye 01/05/2016  . Chronic anticoagulation (Xarelto) 12/26/2015  . Right lumbar radiculitis 11/19/2014  . Insomnia 05/22/2014  . Healthcare maintenance 12/13/2013  . Greater trochanteric bursitis of left hip 05/16/2013  . Dysphagia, unspecified(787.20) 05/10/2013  . Lumbosacral spondylosis without myelopathy 04/18/2013  . Lumbar facet arthropathy 04/18/2013  . Sacroiliac joint dysfunction of left side 02/21/2013  . Leg length discrepancy  02/21/2013  . Seborrhea capitis 01/23/2013  . NSTEMI (non-ST elevated myocardial infarction) ([place]) 08/14/2012  . Tobacco abuse 08/08/2012  . Hypertension 09/25/2011  . GERD 04/08/2010  . Chronic radicular low back pain 07/20/2007  . Diabetes mellitus type 2, controlled ([place]) 11/25/2006  . HYPOGONADISM 11/25/2006  . Coronary atherosclerosis 11/25/2006  . Atrial fibrillation ([place]) 11/25/2006    Mylia Pondexter 03/02/2017, 3:29 PM  [facility] [address] [place], [place], [address] Phone: [phone]   Fax:   [phone]  Name: [name] MRN: [id number] Date of Birth: 1956-10-19   Raeford Razor, PT 03/02/17 3:30 PM Phone: [phone] Fax: [phone]

## 2017-03-09 ENCOUNTER — Ambulatory Visit: Payer: BLUE CROSS/BLUE SHIELD | Admitting: Physical Therapy

## 2017-03-09 DIAGNOSIS — M6281 Muscle weakness (generalized): Secondary | ICD-10-CM | POA: Diagnosis not present

## 2017-03-09 DIAGNOSIS — M5416 Radiculopathy, lumbar region: Secondary | ICD-10-CM

## 2017-03-09 DIAGNOSIS — G8929 Other chronic pain: Secondary | ICD-10-CM

## 2017-03-09 DIAGNOSIS — M5441 Lumbago with sciatica, right side: Secondary | ICD-10-CM

## 2017-03-09 NOTE — Therapy (Signed)
[place], [place], [address] Phone: [phone]   Fax:  [phone]  Physical Therapy Treatment  Patient Details  Name: [name] MRN: [id number] Date of Birth: 1955-12-25 Referring Provider: Dr. Kirtland Bouchard   Encounter Date: 03/09/2017      PT End of Session - 03/09/17 1350    Visit Number 2   Number of Visits 12   Date for PT Re-Evaluation 04/13/17   PT Start Time 8299   PT Stop Time 1437   PT Time Calculation (min) 60 min   Activity Tolerance Patient tolerated treatment well   Behavior During Therapy [facility] for tasks assessed/performed      Past Medical History:  Diagnosis Date  . Atrial arrhythmia   . CAP (community acquired pneumonia)   . Cellulitis of knee, right 06/06/12  . Chronic otitis externa 06/06/12   C Multiple trials with antibiocs and antifungal. Cultures were positive for Pseudomonas. Follow up with ENT on 06/14/12    . CORONARY ARTERY DISEASE 11/25/2006   Cardiac cath by Dr Terrence Dupont 11/2003 LV showed good LV systolic function, EF of 37-16%. Left main was patent. LAD has 20-30% mid stenosis. Diagonal 1 and diagonal 2 were patent. Left circumflex was patent. OM1 was less than 0.5 mm which was diffusely diseased. OM2 has 20% ostial stenosis which was patent which  was moderate size. OM3 was patent at prior PTCA and stented site. RCA has 20-30% proximal   . Hypertension   . NSTEMI (non-ST elevated myocardial infarction) ([place]) 08/14/2012   S/p Successful PTCA to proximal OM-3     . TOBACCO ABUSE 11/25/2006  . Type II diabetes mellitus ([place])     Past Surgical History:  Procedure Laterality Date  . CORONARY ANGIOPLASTY WITH STENT PLACEMENT  ~ 2005   "1"  . CYSTECTOMY  1990's   LUA  . LEFT HEART CATHETERIZATION WITH CORONARY ANGIOGRAM N/A 08/15/2012   Procedure: LEFT HEART CATHETERIZATION WITH CORONARY ANGIOGRAM;  Surgeon: Clent Demark, MD;  Location: [place] CATH LAB;  Service: Cardiovascular;   Laterality: N/A;  . PERCUTANEOUS CORONARY STENT INTERVENTION (PCI-S)  08/15/2012   Procedure: PERCUTANEOUS CORONARY STENT INTERVENTION (PCI-S);  Surgeon: Clent Demark, MD;  Location: [facility] CATH LAB;  Service: Cardiovascular;;    There were no vitals filed for this visit.      Subjective Assessment - 03/09/17 1344    Subjective Min pain today, has no pain in leg today.  3 days ago had some pretty bad pain. Has not done his exercises.  Traction helped last time.    Patient is accompained by: Family member   Currently in Pain? Yes   Pain Score 5    Pain Location Back   Pain Orientation Lower   Pain Descriptors / Indicators Aching   Pain Type Chronic pain   Pain Onset More than a month ago   Pain Frequency Intermittent   Aggravating Factors  working    Pain Relieving Factors light walking, sitting, resting, traction                          [facility] Adult PT Treatment/Exercise - 03/09/17 1353      Self-Care   Other Self-Care Comments  body mech, work tasks, HEP and core      Lumbar Exercises: Stretches   Active Hamstring Stretch 1 rep;30 seconds   Single Knee to Chest Stretch 2 reps;30 seconds   Lower Trunk Rotation  10 seconds   Lower Trunk Rotation Limitations x 10    Pelvic Tilt 10 seconds   Pelvic Tilt Limitations x 10    Piriformis Stretch 2 reps;30 seconds   Piriformis Stretch Limitations knee to chest with rotation      Lumbar Exercises: Supine   Ab Set 10 reps   AB Set Limitations isometric ball press in hooklying    Heel Slides 10 reps   Heel Slides Limitations hamstring curl    Dead Bug 5 reps   Dead Bug Limitations 10 sec hold isometric    Bridge 10 reps   Bridge Limitations feet on the ball    Other Supine Lumbar Exercises chest lift 5 sec hold for upper abdominals      Moist Heat Therapy   Number Minutes Moist Heat 15 Minutes   Moist Heat Location Lumbar Spine     Electrical Stimulation   Electrical Stimulation Location lumbar   Electrical  Stimulation Action IFC   Electrical Stimulation Parameters to tol    Electrical Stimulation Goals Pain     Traction   Type of Traction --   Min (lbs) --   Max (lbs) --   Hold Time --   Rest Time --   Time --                PT Education - 03/09/17 1349    Education provided Yes   Education Details HEP reinforcement    Person(s) Educated Patient   Methods Explanation   Comprehension Verbalized understanding          PT Short Term Goals - 03/09/17 1404      PT SHORT TERM GOAL #1   Title Pt will be independent with HEP    Status On-going     PT SHORT TERM GOAL #2   Title Pt will verbalize centralization of pain to low back   Status On-going           PT Long Term Goals - 03/09/17 1404      PT LONG TERM GOAL #1   Title Pt will be I with full HEP and do regularly for maximal recovery.    Status On-going     PT LONG TERM GOAL #2   Title Pt will be able to climb ladder for work activities with minimal back discomfort   Status On-going     PT LONG TERM GOAL #3   Title Pt will verbalize ability to incorporate stretches into day to avoid excessive pain in the evenings   Status On-going     PT LONG TERM GOAL #4   Title Pt will verbalize average pain <5/10 in low back to decrease effects of pain on daily activities   Status On-going     PT LONG TERM GOAL #5   Title FOTO to 65% ability to indicate significant functional improvement   Status On-going               Plan - 03/09/17 1422    Clinical Impression Statement Patient reports with mod pain in low back, needs reiteration to do his HEP.  Was able to say how he modifies his body mechanics for lifting and maneuvering ladder.  Would like to get back into the gym but needs education on what to do and what to avoid.  Declined traction today, preferred IFC and heat.    PT Next Visit Plan HEP! Practice lifting for work simulation, ladder? [employer] for functional strengthening    PT  Home Exercise Plan  PPT, knee to chest, double knee to chest and lower ab , hamstring stretch and bridge.    Consulted and Agree with Plan of Care Patient;Family member/caregiver   Family Member Consulted wife       Patient will benefit from skilled therapeutic intervention in order to improve the following deficits and impairments:  Decreased mobility, Decreased strength, Improper body mechanics, Impaired flexibility, Pain, Increased fascial restricitons, Difficulty walking  Visit Diagnosis: Muscle weakness (generalized)  Radiculopathy, lumbar region  Chronic bilateral low back pain with right-sided sciatica     Problem List Patient Active Problem List   Diagnosis Date Noted  . Need for vaccination with 13-polyvalent pneumococcal conjugate vaccine 12/03/2016  . Hematuria, gross 09/10/2016  . Oral candidiasis 09/10/2016  . Diarrhea 09/10/2016  . Sensation of foreign body in eye 01/05/2016  . Chronic anticoagulation (Xarelto) 12/26/2015  . Right lumbar radiculitis 11/19/2014  . Insomnia 05/22/2014  . Healthcare maintenance 12/13/2013  . Greater trochanteric bursitis of left hip 05/16/2013  . Dysphagia, unspecified(787.20) 05/10/2013  . Lumbosacral spondylosis without myelopathy 04/18/2013  . Lumbar facet arthropathy 04/18/2013  . Sacroiliac joint dysfunction of left side 02/21/2013  . Leg length discrepancy 02/21/2013  . Seborrhea capitis 01/23/2013  . NSTEMI (non-ST elevated myocardial infarction) ([place]) 08/14/2012  . Tobacco abuse 08/08/2012  . Hypertension 09/25/2011  . GERD 04/08/2010  . Chronic radicular low back pain 07/20/2007  . Diabetes mellitus type 2, controlled ([place]) 11/25/2006  . HYPOGONADISM 11/25/2006  . Coronary atherosclerosis 11/25/2006  . Atrial fibrillation ([place]) 11/25/2006    Nelia Rogoff 03/09/2017, 2:33 PM  [facility] [address] [place], [place], [address] Phone: [phone]   Fax:  [phone]  Name: [name] MRN: [id number] Date of Birth: [birth year]  Raeford Razor, PT 03/09/17 2:34 PM Phone: [phone] Fax: [phone]

## 2017-03-09 NOTE — Patient Instructions (Addendum)
Hamstring: Towel Stretch (Supine)    Lie on back. Loop towel around left foot, hip and knee at 90. Straighten knee and pull foot toward body. Hold _30__ seconds. Relax. Repeat _3__ times. Do __1-2_ times a day. Repeat with other leg.    Copyright  VHI. All rights reserved.   Bridging    Slowly raise buttocks from floor, keeping stomach tight. Repeat _10___ times per set. Do _1___ sets per session. Do ___2_ sessions per day.  http://orth.exer.us/1096   Copyright  VHI. All rights reserved.

## 2017-03-15 ENCOUNTER — Ambulatory Visit: Payer: BLUE CROSS/BLUE SHIELD | Admitting: Physical Therapy

## 2017-03-15 DIAGNOSIS — G8929 Other chronic pain: Secondary | ICD-10-CM

## 2017-03-15 DIAGNOSIS — M6281 Muscle weakness (generalized): Secondary | ICD-10-CM

## 2017-03-15 DIAGNOSIS — M5441 Lumbago with sciatica, right side: Secondary | ICD-10-CM

## 2017-03-15 DIAGNOSIS — M5416 Radiculopathy, lumbar region: Secondary | ICD-10-CM

## 2017-03-15 NOTE — Therapy (Signed)
[facility] [address] [place], [place], [address] Phone: [phone]   Fax:  [phone]  Physical Therapy Treatment  Patient Details  Name: [name] MRN: [id number] Date of Birth: 05-02-1956 Referring Provider: Dr. Jenelle Mages   Encounter Date: 03/15/2017      PT End of Session - 03/15/17 1458    Visit Number 3   Number of Visits 12   Date for PT Re-Evaluation 04/13/17   PT Start Time 1418   PT Stop Time 1507   PT Time Calculation (min) 49 min   Activity Tolerance Patient tolerated treatment well   Behavior During Therapy [facility] for tasks assessed/performed      Past Medical History:  Diagnosis Date  . Atrial arrhythmia   . CAP (community acquired pneumonia)   . Cellulitis of knee, right 06/06/12  . Chronic otitis externa 06/06/12   C Multiple trials with antibiocs and antifungal. Cultures were positive for Pseudomonas. Follow up with ENT on 06/14/12    . CORONARY ARTERY DISEASE 11/25/2006   Cardiac cath by Dr Sharyn Lull 11/2003 LV showed good LV systolic function, EF of 55-60%. Left main was patent. LAD has 20-30% mid stenosis. Diagonal 1 and diagonal 2 were patent. Left circumflex was patent. OM1 was less than 0.5 mm which was diffusely diseased. OM2 has 20% ostial stenosis which was patent which  was moderate size. OM3 was patent at prior PTCA and stented site. RCA has 20-30% proximal   . Hypertension   . NSTEMI (non-ST elevated myocardial infarction) (HCC) 08/14/2012   S/p Successful PTCA to proximal OM-3     . TOBACCO ABUSE 11/25/2006  . Type II diabetes mellitus (HCC)     Past Surgical History:  Procedure Laterality Date  . CORONARY ANGIOPLASTY WITH STENT PLACEMENT  ~ 2005   "1"  . CYSTECTOMY  1990's   LUA  . LEFT HEART CATHETERIZATION WITH CORONARY ANGIOGRAM N/A 08/15/2012   Procedure: LEFT HEART CATHETERIZATION WITH CORONARY ANGIOGRAM;  Surgeon: Robynn Pane, MD;  Location: [facility] CATH LAB;  Service: Cardiovascular;   Laterality: N/A;  . PERCUTANEOUS CORONARY STENT INTERVENTION (PCI-S)  08/15/2012   Procedure: PERCUTANEOUS CORONARY STENT INTERVENTION (PCI-S);  Surgeon: Robynn Pane, MD;  Location: [facility] CATH LAB;  Service: Cardiovascular;;    There were no vitals filed for this visit.      Subjective Assessment - 03/15/17 1430    Subjective Ive been exercising, doing my stretches.  If I could just manage this pain.     Currently in Pain? Yes   Pain Score 3    Pain Location Back   Pain Orientation Lower   Pain Descriptors / Indicators Aching   Pain Type Chronic pain   Pain Onset More than a month ago   Pain Frequency Intermittent   Aggravating Factors  when I sit after working then go to stand it hurts. (I'm tore up!)   Pain Relieving Factors stretching            [facility] Adult PT Treatment/Exercise - 03/15/17 1432      Lumbar Exercises: Stretches   Single Knee to Chest Stretch 2 reps;30 seconds   Lower Trunk Rotation 10 seconds   Lower Trunk Rotation Limitations x 10      Lumbar Exercises: Aerobic   Stationary Bike Nustep L5 UE and LE 5 min      Lumbar Exercises: Supine   Bent Knee Raise 10 reps   Bridge 10 reps   Bridge Limitations feet  on the ball      Lumbar Exercises: Sidelying   Clam 20 reps   Hip Abduction 10 reps     Traction   Type of Traction Lumbar   Min (lbs) 75   Max (lbs) 100   Hold Time 60   Rest Time 15   Time 15                  PT Short Term Goals - 03/15/17 1440      PT SHORT TERM GOAL #1   Title Pt will be independent with HEP    Status Achieved     PT SHORT TERM GOAL #2   Title Pt will verbalize centralization of pain to low back   Status Partially Met           PT Long Term Goals - 03/15/17 1440      PT LONG TERM GOAL #1   Title Pt will be I with full HEP and do regularly for maximal recovery.    Status On-going     PT LONG TERM GOAL #2   Title Pt will be able to climb ladder for work activities with minimal back discomfort    Status On-going     PT LONG TERM GOAL #3   Title Pt will verbalize ability to incorporate stretches into day to avoid excessive pain in the evenings   Status On-going     PT LONG TERM GOAL #4   Title Pt will verbalize average pain <5/10 in low back to decrease effects of pain on daily activities   Status On-going     PT LONG TERM GOAL #5   Title FOTO to 65% ability to indicate significant functional improvement   Status On-going               Plan - 03/15/17 1500    Clinical Impression Statement Patient doing well, has been more consistent with HEP.  Goals in progress. Unable to do higher level core exercises in supine without increasing back pain    PT Next Visit Plan Practice lifting for work simulation, ladder? Freemotion for functional strengthening    PT Home Exercise Plan PPT, knee to chest, double knee to chest and lower ab , hamstring stretch and bridge.    Consulted and Agree with Plan of Care Patient;Family member/caregiver   Family Member Consulted wife       Patient will benefit from skilled therapeutic intervention in order to improve the following deficits and impairments:  Decreased mobility, Decreased strength, Improper body mechanics, Impaired flexibility, Pain, Increased fascial restricitons, Difficulty walking  Visit Diagnosis: Muscle weakness (generalized)  Radiculopathy, lumbar region  Chronic bilateral low back pain with right-sided sciatica     Problem List Patient Active Problem List   Diagnosis Date Noted  . Need for vaccination with 13-polyvalent pneumococcal conjugate vaccine 12/03/2016  . Hematuria, gross 09/10/2016  . Oral candidiasis 09/10/2016  . Diarrhea 09/10/2016  . Sensation of foreign body in eye 01/05/2016  . Chronic anticoagulation (Xarelto) 12/26/2015  . Right lumbar radiculitis 11/19/2014  . Insomnia 05/22/2014  . Healthcare maintenance 12/13/2013  . Greater trochanteric bursitis of left hip 05/16/2013  . Dysphagia,  unspecified(787.20) 05/10/2013  . Lumbosacral spondylosis without myelopathy 04/18/2013  . Lumbar facet arthropathy ([place]) 04/18/2013  . Sacroiliac joint dysfunction of left side 02/21/2013  . Leg length discrepancy 02/21/2013  . Seborrhea capitis 01/23/2013  . NSTEMI (non-ST elevated myocardial infarction) ([place]) 08/14/2012  . Tobacco abuse 08/08/2012  .  Hypertension 09/25/2011  . GERD 04/08/2010  . Chronic radicular low back pain 07/20/2007  . Diabetes mellitus type 2, controlled ([place]) 11/25/2006  . HYPOGONADISM 11/25/2006  . Coronary atherosclerosis 11/25/2006  . Atrial fibrillation ([place]) 11/25/2006    PAA,JENNIFER 03/15/2017, 3:04 PM  [facility] [address] [place], [place], [address] Phone: [phone]   Fax:  [phone]  Name: [name] MRN: [id number] Date of Birth: [birth year]  Raeford Razor, PT 03/15/17 3:05 PM Phone: [phone] Fax: [phone]

## 2017-03-16 ENCOUNTER — Ambulatory Visit: Payer: BLUE CROSS/BLUE SHIELD | Admitting: Physical Therapy

## 2017-03-16 DIAGNOSIS — M6281 Muscle weakness (generalized): Secondary | ICD-10-CM

## 2017-03-16 DIAGNOSIS — G8929 Other chronic pain: Secondary | ICD-10-CM

## 2017-03-16 DIAGNOSIS — M5441 Lumbago with sciatica, right side: Secondary | ICD-10-CM

## 2017-03-16 DIAGNOSIS — M5416 Radiculopathy, lumbar region: Secondary | ICD-10-CM

## 2017-03-16 NOTE — Therapy (Signed)
[place], [place], [address] Phone: [phone]   Fax:  [phone]  Physical Therapy Treatment  Patient Details  Name: [name] MRN: [id number] Date of Birth: 1956-06-10 Referring Provider: Dr. Kirtland Bouchard   Encounter Date: 03/16/2017      PT End of Session - 03/16/17 1521    Visit Number 4   Number of Visits 12   Date for PT Re-Evaluation 04/13/17   PT Start Time 1427   PT Stop Time 1530   PT Time Calculation (min) 63 min   Activity Tolerance Patient tolerated treatment well   Behavior During Therapy [facility] for tasks assessed/performed      Past Medical History:  Diagnosis Date  . Atrial arrhythmia   . CAP (community acquired pneumonia)   . Cellulitis of knee, right 06/06/12  . Chronic otitis externa 06/06/12   C Multiple trials with antibiocs and antifungal. Cultures were positive for Pseudomonas. Follow up with ENT on 06/14/12    . CORONARY ARTERY DISEASE 11/25/2006   Cardiac cath by Dr Terrence Dupont 11/2003 LV showed good LV systolic function, EF of 50-53%. Left main was patent. LAD has 20-30% mid stenosis. Diagonal 1 and diagonal 2 were patent. Left circumflex was patent. OM1 was less than 0.5 mm which was diffusely diseased. OM2 has 20% ostial stenosis which was patent which  was moderate size. OM3 was patent at prior PTCA and stented site. RCA has 20-30% proximal   . Hypertension   . NSTEMI (non-ST elevated myocardial infarction) ([place]) 08/14/2012   S/p Successful PTCA to proximal OM-3     . TOBACCO ABUSE 11/25/2006  . Type II diabetes mellitus ([place])     Past Surgical History:  Procedure Laterality Date  . CORONARY ANGIOPLASTY WITH STENT PLACEMENT  ~ 2005   "1"  . CYSTECTOMY  1990's   LUA  . LEFT HEART CATHETERIZATION WITH CORONARY ANGIOGRAM N/A 08/15/2012   Procedure: LEFT HEART CATHETERIZATION WITH CORONARY ANGIOGRAM;  Surgeon: Clent Demark, MD;  Location: [place] CATH LAB;  Service: Cardiovascular;   Laterality: N/A;  . PERCUTANEOUS CORONARY STENT INTERVENTION (PCI-S)  08/15/2012   Procedure: PERCUTANEOUS CORONARY STENT INTERVENTION (PCI-S);  Surgeon: Clent Demark, MD;  Location: [facility] CATH LAB;  Service: Cardiovascular;;    There were no vitals filed for this visit.      Subjective Assessment - 03/16/17 1430    Subjective My Rt. hip is hurting today, 4/10.     Currently in Pain? Yes   Pain Score 4    Pain Location Hip   Pain Orientation Right;Proximal;Lateral   Pain Descriptors / Indicators Sore   Pain Type Chronic pain   Pain Onset More than a month ago   Pain Frequency Intermittent   Aggravating Factors  sitting after work    Pain Relieving Factors stretching               [facility] Adult PT Treatment/Exercise - 03/16/17 1436      Therapeutic Activites    Therapeutic Activities Lifting;Work Economist;Other Therapeutic Activities   Lifting hip hinge and weight in heels    Work Simulation diagonal pull 2 plates each UE , oblique twist (transverse plane)    Other Therapeutic Activities Used Freemotion for more dynamic strengthening: 2-3 plates: ext, row , pushing, pulling, squat with lift overhead 2 plates      Lumbar Exercises: Stretches   Prone on Elbows Stretch 2 reps;60 seconds     Lumbar Exercises: Aerobic  Elliptical level 1 ramp 3 for 5 min used arm for added challenge      Lumbar Exercises: Prone   Straight Leg Raise 10 reps     Moist Heat Therapy   Number Minutes Moist Heat 15 Minutes   Moist Heat Location Lumbar Spine     Electrical Stimulation   Electrical Stimulation Location lumbar   Electrical Stimulation Action IFC   Electrical Stimulation Parameters to tol    Electrical Stimulation Goals Pain     Manual Therapy   Manual Therapy Myofascial release;Passive ROM   Myofascial Release lumbar and hips    Passive ROM hip ER and IR with piriformis compression                 PT Education - 03/16/17 1520    Education provided Yes    Education Details core activation with functional strengthening    Person(s) Educated Patient   Methods Explanation   Comprehension Verbalized understanding          PT Short Term Goals - 03/15/17 1440      PT SHORT TERM GOAL #1   Title Pt will be independent with HEP    Status Achieved     PT SHORT TERM GOAL #2   Title Pt will verbalize centralization of pain to low back   Status Partially Met           PT Long Term Goals - 03/15/17 1440      PT LONG TERM GOAL #1   Title Pt will be I with full HEP and do regularly for maximal recovery.    Status On-going     PT LONG TERM GOAL #2   Title Pt will be able to climb ladder for work activities with minimal back discomfort   Status On-going     PT LONG TERM GOAL #3   Title Pt will verbalize ability to incorporate stretches into day to avoid excessive pain in the evenings   Status On-going     PT LONG TERM GOAL #4   Title Pt will verbalize average pain <5/10 in low back to decrease effects of pain on daily activities   Status On-going     PT LONG TERM GOAL #5   Title FOTO to 65% ability to indicate significant functional improvement   Status On-going               Plan - 03/16/17 1521    Clinical Impression Statement MIn pain increase in Rt. hip today with functional strengthening.  Could be sore also from yesterday's workout.  Poor hip hinge, cont to reinforce stretching at home and using proper lifting techniques.    PT Next Visit Plan Practice lifting for work simulation, ladder? Freemotion for functional strengthening    PT Home Exercise Plan PPT, knee to chest, double knee to chest and lower ab , hamstring stretch and bridge.    Consulted and Agree with Plan of Care Patient      Patient will benefit from skilled therapeutic intervention in order to improve the following deficits and impairments:  Decreased mobility, Decreased strength, Improper body mechanics, Impaired flexibility, Pain, Increased fascial  restricitons, Difficulty walking  Visit Diagnosis: Muscle weakness (generalized)  Radiculopathy, lumbar region  Chronic bilateral low back pain with right-sided sciatica     Problem List Patient Active Problem List   Diagnosis Date Noted  . Need for vaccination with 13-polyvalent pneumococcal conjugate vaccine 12/03/2016  . Hematuria, gross 09/10/2016  . Oral candidiasis  09/10/2016  . Diarrhea 09/10/2016  . Sensation of foreign body in eye 01/05/2016  . Chronic anticoagulation (Xarelto) 12/26/2015  . Right lumbar radiculitis 11/19/2014  . Insomnia 05/22/2014  . Healthcare maintenance 12/13/2013  . Greater trochanteric bursitis of left hip 05/16/2013  . Dysphagia, unspecified(787.20) 05/10/2013  . Lumbosacral spondylosis without myelopathy 04/18/2013  . Lumbar facet arthropathy ([place]) 04/18/2013  . Sacroiliac joint dysfunction of left side 02/21/2013  . Leg length discrepancy 02/21/2013  . Seborrhea capitis 01/23/2013  . NSTEMI (non-ST elevated myocardial infarction) ([place]) 08/14/2012  . Tobacco abuse 08/08/2012  . Hypertension 09/25/2011  . GERD 04/08/2010  . Chronic radicular low back pain 07/20/2007  . Diabetes mellitus type 2, controlled ([place]) 11/25/2006  . HYPOGONADISM 11/25/2006  . Coronary atherosclerosis 11/25/2006  . Atrial fibrillation ([place]) 11/25/2006    Seidy Labreck 03/16/2017, 3:25 PM  [facility] [address] [place], [place], [address] Phone: [phone]   Fax:  [phone]  Name: [name] MRN: [id number] Date of Birth: 02/04/1956  Raeford Razor, PT 03/16/17 3:26 PM Phone: [phone] Fax: [phone]

## 2017-03-21 ENCOUNTER — Ambulatory Visit: Payer: BLUE CROSS/BLUE SHIELD | Admitting: Physical Therapy

## 2017-03-21 DIAGNOSIS — M6281 Muscle weakness (generalized): Secondary | ICD-10-CM

## 2017-03-21 DIAGNOSIS — M5416 Radiculopathy, lumbar region: Secondary | ICD-10-CM

## 2017-03-21 DIAGNOSIS — M5441 Lumbago with sciatica, right side: Secondary | ICD-10-CM

## 2017-03-21 DIAGNOSIS — G8929 Other chronic pain: Secondary | ICD-10-CM

## 2017-03-21 NOTE — Therapy (Signed)
[place], [place], [address] Phone: [phone]   Fax:  [phone]  Physical Therapy Treatment  Patient Details  Name: [name] MRN: [id number] Date of Birth: 06-May-1956 Referring Provider: Dr. Kirtland Bouchard   Encounter Date: 03/21/2017      PT End of Session - 03/21/17 1426    Visit Number 5   Number of Visits 12   Date for PT Re-Evaluation 04/13/17   PT Start Time 1418   PT Stop Time 1500   PT Time Calculation (min) 42 min   Activity Tolerance Patient tolerated treatment well   Behavior During Therapy [facility] for tasks assessed/performed      Past Medical History:  Diagnosis Date  . Atrial arrhythmia   . CAP (community acquired pneumonia)   . Cellulitis of knee, right 06/06/12  . Chronic otitis externa 06/06/12   C Multiple trials with antibiocs and antifungal. Cultures were positive for Pseudomonas. Follow up with ENT on 06/14/12    . CORONARY ARTERY DISEASE 11/25/2006   Cardiac cath by Dr Terrence Dupont 11/2003 LV showed good LV systolic function, EF of 49-82%. Left main was patent. LAD has 20-30% mid stenosis. Diagonal 1 and diagonal 2 were patent. Left circumflex was patent. OM1 was less than 0.5 mm which was diffusely diseased. OM2 has 20% ostial stenosis which was patent which  was moderate size. OM3 was patent at prior PTCA and stented site. RCA has 20-30% proximal   . Hypertension   . NSTEMI (non-ST elevated myocardial infarction) ([place]) 08/14/2012   S/p Successful PTCA to proximal OM-3     . TOBACCO ABUSE 11/25/2006  . Type II diabetes mellitus ([place])     Past Surgical History:  Procedure Laterality Date  . CORONARY ANGIOPLASTY WITH STENT PLACEMENT  ~ 2005   "1"  . CYSTECTOMY  1990's   LUA  . LEFT HEART CATHETERIZATION WITH CORONARY ANGIOGRAM N/A 08/15/2012   Procedure: LEFT HEART CATHETERIZATION WITH CORONARY ANGIOGRAM;  Surgeon: Clent Demark, MD;  Location: [place] CATH LAB;  Service: Cardiovascular;   Laterality: N/A;  . PERCUTANEOUS CORONARY STENT INTERVENTION (PCI-S)  08/15/2012   Procedure: PERCUTANEOUS CORONARY STENT INTERVENTION (PCI-S);  Surgeon: Clent Demark, MD;  Location: [facility] CATH LAB;  Service: Cardiovascular;;    There were no vitals filed for this visit.      Subjective Assessment - 03/21/17 1428    Subjective My back is OK.  Was able to till my yard without a whole lot of pain in my back. No  leg pain today.  Sees Surgeon (Dr. Ronnald Ramp) tomorrow.     Currently in Pain? Yes   Pain Score 2    Pain Location Back   Pain Orientation Right;Lower;Left   Pain Descriptors / Indicators Tightness   Pain Type Chronic pain   Pain Radiating Towards not today, can be Rt. LE    Pain Onset More than a month ago   Pain Frequency Intermittent   Aggravating Factors  sitting down after a long day working   Pain Relieving Factors HEP              [facility] Adult PT Treatment/Exercise - 03/21/17 1434      Lumbar Exercises: Stretches   Active Hamstring Stretch 2 reps;30 seconds   Lower Trunk Rotation 10 seconds   Lower Trunk Rotation Limitations x 10    ITB Stretch 2 reps;30 seconds   ITB Stretch Limitations with strap    Piriformis Stretch 2 reps;30  seconds   Piriformis Stretch Limitations knee to chest with rotation      Lumbar Exercises: Aerobic   Stationary Bike NuStep L5 for 8 min      Lumbar Exercises: Machines for Strengthening   Leg Press 2-3 plates x 20 reps for core and proper lifting techniques    Other Lumbar Machine Exercise Standing lat pull down/high ROW 25 lbs    Other Lumbar Machine Exercise shoulder ext 20 lbs x 10      Lumbar Exercises: Standing   Other Standing Lumbar Exercises standing sink stretch  for "traction"      Lumbar Exercises: Supine   Bridge 10 reps   Bridge Limitations small ball squeeze    Large Ball Abdominal Isometric 10 reps   Other Supine Lumbar Exercises bridge added march and clam x 10 each      Lumbar Exercises: Sidelying   Hip  Abduction 20 reps                  PT Short Term Goals - 03/21/17 1438      PT SHORT TERM GOAL #1   Title Pt will be independent with HEP    Status Achieved     PT SHORT TERM GOAL #2   Title Pt will verbalize centralization of pain to low back   Status Partially Met           PT Long Term Goals - 03/21/17 1438      PT LONG TERM GOAL #1   Title Pt will be I with full HEP and do regularly for maximal recovery.    Status On-going     PT LONG TERM GOAL #2   Title Pt will be able to climb ladder for work activities with minimal back discomfort   Status On-going     PT LONG TERM GOAL #3   Title Pt will verbalize ability to incorporate stretches into day to avoid excessive pain in the evenings   Status On-going     PT LONG TERM GOAL #4   Title Pt will verbalize average pain <5/10 in low back to decrease effects of pain on daily activities   Status On-going     PT LONG TERM GOAL #5   Title FOTO to 65% ability to indicate significant functional improvement   Status On-going               Plan - 03/21/17 1502    Clinical Impression Statement Patient able to do yardwork, gardening without increasing back or leg pain.  Improving functionally and understands needs for regular HEP.     PT Next Visit Plan check goals, see what MD said ,Practice lifting for work simulation, ladder? Freemotion for functional strengthening , Cont with core, stretching.    PT Home Exercise Plan PPT, knee to chest, double knee to chest and lower ab , hamstring stretch and bridge.    Consulted and Agree with Plan of Care Patient      Patient will benefit from skilled therapeutic intervention in order to improve the following deficits and impairments:  Decreased mobility, Decreased strength, Improper body mechanics, Impaired flexibility, Pain, Increased fascial restricitons, Difficulty walking  Visit Diagnosis: Muscle weakness (generalized)  Radiculopathy, lumbar region  Chronic  bilateral low back pain with right-sided sciatica     Problem List Patient Active Problem List   Diagnosis Date Noted  . Need for vaccination with 13-polyvalent pneumococcal conjugate vaccine 12/03/2016  . Hematuria, gross 09/10/2016  . Oral candidiasis  09/10/2016  . Diarrhea 09/10/2016  . Sensation of foreign body in eye 01/05/2016  . Chronic anticoagulation (Xarelto) 12/26/2015  . Right lumbar radiculitis 11/19/2014  . Insomnia 05/22/2014  . Healthcare maintenance 12/13/2013  . Greater trochanteric bursitis of left hip 05/16/2013  . Dysphagia, unspecified(787.20) 05/10/2013  . Lumbosacral spondylosis without myelopathy 04/18/2013  . Lumbar facet arthropathy ([place]) 04/18/2013  . Sacroiliac joint dysfunction of left side 02/21/2013  . Leg length discrepancy 02/21/2013  . Seborrhea capitis 01/23/2013  . NSTEMI (non-ST elevated myocardial infarction) ([place]) 08/14/2012  . Tobacco abuse 08/08/2012  . Hypertension 09/25/2011  . GERD 04/08/2010  . Chronic radicular low back pain 07/20/2007  . Diabetes mellitus type 2, controlled ([place]) 11/25/2006  . HYPOGONADISM 11/25/2006  . Coronary atherosclerosis 11/25/2006  . Atrial fibrillation ([place]) 11/25/2006    PAA,JENNIFER 03/21/2017, 3:06 PM  [facility] [address] [place], [place], [address] Phone: [phone]   Fax:  [phone]  Name: [name] MRN: [id number] Date of Birth: July 22, 1956   Raeford Razor, PT 03/21/17 3:07 PM Phone: [phone] Fax: [phone]

## 2017-03-23 ENCOUNTER — Ambulatory Visit: Payer: BLUE CROSS/BLUE SHIELD | Admitting: Physical Therapy

## 2017-03-28 ENCOUNTER — Ambulatory Visit: Payer: BLUE CROSS/BLUE SHIELD | Admitting: Physical Therapy

## 2017-03-28 DIAGNOSIS — M5441 Lumbago with sciatica, right side: Secondary | ICD-10-CM

## 2017-03-28 DIAGNOSIS — G8929 Other chronic pain: Secondary | ICD-10-CM

## 2017-03-28 DIAGNOSIS — M6281 Muscle weakness (generalized): Secondary | ICD-10-CM | POA: Diagnosis not present

## 2017-03-28 DIAGNOSIS — M5416 Radiculopathy, lumbar region: Secondary | ICD-10-CM

## 2017-03-28 NOTE — Therapy (Signed)
[place], [place], [address] Phone: [phone]   Fax:  [phone]  Physical Therapy Treatment  Patient Details  Name: [name] MRN: [id number] Date of Birth: January 05, 1956 Referring Provider: Dr. Kirtland Bouchard   Encounter Date: 03/28/2017      PT End of Session - 03/28/17 1459    Visit Number 6   Number of Visits 12   Date for PT Re-Evaluation 04/13/17   PT Start Time 8381   PT Stop Time 1510   PT Time Calculation (min) 54 min   Activity Tolerance Patient tolerated treatment well   Behavior During Therapy [facility] for tasks assessed/performed      Past Medical History:  Diagnosis Date  . Atrial arrhythmia   . CAP (community acquired pneumonia)   . Cellulitis of knee, right 06/06/12  . Chronic otitis externa 06/06/12   C Multiple trials with antibiocs and antifungal. Cultures were positive for Pseudomonas. Follow up with ENT on 06/14/12    . CORONARY ARTERY DISEASE 11/25/2006   Cardiac cath by Dr Terrence Dupont 11/2003 LV showed good LV systolic function, EF of 84-03%. Left main was patent. LAD has 20-30% mid stenosis. Diagonal 1 and diagonal 2 were patent. Left circumflex was patent. OM1 was less than 0.5 mm which was diffusely diseased. OM2 has 20% ostial stenosis which was patent which  was moderate size. OM3 was patent at prior PTCA and stented site. RCA has 20-30% proximal   . Hypertension   . NSTEMI (non-ST elevated myocardial infarction) ([place]) 08/14/2012   S/p Successful PTCA to proximal OM-3     . TOBACCO ABUSE 11/25/2006  . Type II diabetes mellitus ([place])     Past Surgical History:  Procedure Laterality Date  . CORONARY ANGIOPLASTY WITH STENT PLACEMENT  ~ 2005   "1"  . CYSTECTOMY  1990's   LUA  . LEFT HEART CATHETERIZATION WITH CORONARY ANGIOGRAM N/A 08/15/2012   Procedure: LEFT HEART CATHETERIZATION WITH CORONARY ANGIOGRAM;  Surgeon: Clent Demark, MD;  Location: [place] CATH LAB;  Service: Cardiovascular;   Laterality: N/A;  . PERCUTANEOUS CORONARY STENT INTERVENTION (PCI-S)  08/15/2012   Procedure: PERCUTANEOUS CORONARY STENT INTERVENTION (PCI-S);  Surgeon: Clent Demark, MD;  Location: [facility] CATH LAB;  Service: Cardiovascular;;    There were no vitals filed for this visit.      Subjective Assessment - 03/28/17 1424    Subjective My back is about a 3/10 today.  Was a 9/10 after my last job.  The MD recommends surgery but I have about 5-6 weeks to decide.     Currently in Pain? Yes   Pain Score 3    Pain Location Back   Pain Orientation Right;Left;Lower   Pain Descriptors / Indicators Sore   Pain Type Chronic pain   Pain Onset More than a month ago   Pain Frequency Intermittent   Aggravating Factors  heavy work   Pain Relieving Factors HEP, stretching, heat, PT               [facility] Adult PT Treatment/Exercise - 03/28/17 1421      Lumbar Exercises: Stretches   Single Knee to Chest Stretch 2 reps;30 seconds   Double Knee to Chest Stretch 3 reps;30 seconds   Lower Trunk Rotation 10 seconds   Lower Trunk Rotation Limitations x 10    Pelvic Tilt 10 seconds   Pelvic Tilt Limitations x 10    Piriformis Stretch 2 reps;30 seconds   Piriformis Stretch  Limitations knee to chest with rotation      Lumbar Exercises: Aerobic   Elliptical level 10 ramp and level 1 resistance , 6 min for cardio, conditioning      Lumbar Exercises: Standing   Row Strengthening;Both;20 reps   Theraband Level (Row) --  4 plates   Row Limitations high row    Other Standing Lumbar Exercises Freemotion 3 plates trunk rotation x 10 each side      Lumbar Exercises: Supine   Bent Knee Raise 10 reps   Dead Bug 5 reps   Dead Bug Limitations 10 sec iso hold    Bridge 10 reps;Other (comment)   Bridge Limitations small ball squeeze   single leg bridge x 10 each      Moist Heat Therapy   Number Minutes Moist Heat 15 Minutes   Moist Heat Location Lumbar Spine     Electrical Stimulation   Electrical  Stimulation Location lumbar   Electrical Stimulation Action IFC   Electrical Stimulation Parameters to tol   Electrical Stimulation Goals Pain                PT Education - 03/28/17 1503    Education provided Yes   Education Details plan to finish POC and then DC, surgery and outcomes    Person(s) Educated Patient   Methods Explanation   Comprehension Verbalized understanding          PT Short Term Goals - 03/28/17 1440      PT SHORT TERM GOAL #1   Title Pt will be independent with HEP    Status Achieved     PT SHORT TERM GOAL #2   Title Pt will verbalize centralization of pain to low back   Baseline had some this AM is intermittent about 3-4 days per week in AM    Status Partially Met           PT Long Term Goals - 03/28/17 1441      PT LONG TERM GOAL #1   Title Pt will be I with full HEP and do regularly for maximal recovery.    Status On-going     PT LONG TERM GOAL #2   Title Pt will be able to climb ladder for work activities with minimal back discomfort   Status Achieved     PT LONG TERM GOAL #3   Title Pt will verbalize ability to incorporate stretches into day to avoid excessive pain in the evenings   Status Partially Met     PT LONG TERM GOAL #4   Title Pt will verbalize average pain <5/10 in low back to decrease effects of pain on daily activities   Baseline improving    Status Partially Met     PT LONG TERM GOAL #5   Title FOTO to 65% ability to indicate significant functional improvement   Status Unable to assess               Plan - 03/28/17 1504    Clinical Impression Statement Patient with increase in pain due to a heavy workload over the weekend.  LE pain is becoming intermittent.  Does his daily exercises.  Is considering surgery.  Would like Home TENS unit. I told him if surgery is recommended then extending his PT will not be an option.  He was agreeable.    PT Next Visit Plan Practice lifting for work simulation, ladder?  Freemotion for functional strengthening , Cont with core, stretching.  PT Home Exercise Plan PPT, knee to chest, double knee to chest and lower ab , hamstring stretch and bridge.    Consulted and Agree with Plan of Care Patient      Patient will benefit from skilled therapeutic intervention in order to improve the following deficits and impairments:  Decreased mobility, Decreased strength, Improper body mechanics, Impaired flexibility, Pain, Increased fascial restricitons, Difficulty walking  Visit Diagnosis: Muscle weakness (generalized)  Radiculopathy, lumbar region  Chronic bilateral low back pain with right-sided sciatica     Problem List Patient Active Problem List   Diagnosis Date Noted  . Need for vaccination with 13-polyvalent pneumococcal conjugate vaccine 12/03/2016  . Hematuria, gross 09/10/2016  . Oral candidiasis 09/10/2016  . Diarrhea 09/10/2016  . Sensation of foreign body in eye 01/05/2016  . Chronic anticoagulation (Xarelto) 12/26/2015  . Right lumbar radiculitis 11/19/2014  . Insomnia 05/22/2014  . Healthcare maintenance 12/13/2013  . Greater trochanteric bursitis of left hip 05/16/2013  . Dysphagia, unspecified(787.20) 05/10/2013  . Lumbosacral spondylosis without myelopathy 04/18/2013  . Lumbar facet arthropathy ([place]) 04/18/2013  . Sacroiliac joint dysfunction of left side 02/21/2013  . Leg length discrepancy 02/21/2013  . Seborrhea capitis 01/23/2013  . NSTEMI (non-ST elevated myocardial infarction) ([place]) 08/14/2012  . Tobacco abuse 08/08/2012  . Hypertension 09/25/2011  . GERD 04/08/2010  . Chronic radicular low back pain 07/20/2007  . Diabetes mellitus type 2, controlled ([place]) 11/25/2006  . HYPOGONADISM 11/25/2006  . Coronary atherosclerosis 11/25/2006  . Atrial fibrillation ([place]) 11/25/2006    Shaydon Lease 03/28/2017, 3:05 PM  [facility] [address] [place], [place], [address] Phone:  [phone]   Fax:  [phone]  Name: [name] MRN: [id number] Date of Birth: 1955-12-16  Raeford Razor, PT 03/28/17 3:06 PM Phone: [phone] Fax: [phone]

## 2017-03-30 ENCOUNTER — Ambulatory Visit: Payer: BLUE CROSS/BLUE SHIELD | Admitting: Physical Therapy

## 2017-04-05 ENCOUNTER — Ambulatory Visit: Payer: BLUE CROSS/BLUE SHIELD | Attending: Physical Medicine and Rehabilitation | Admitting: Physical Therapy

## 2017-04-05 DIAGNOSIS — M5416 Radiculopathy, lumbar region: Secondary | ICD-10-CM | POA: Diagnosis present

## 2017-04-05 DIAGNOSIS — G8929 Other chronic pain: Secondary | ICD-10-CM | POA: Insufficient documentation

## 2017-04-05 DIAGNOSIS — M5441 Lumbago with sciatica, right side: Secondary | ICD-10-CM | POA: Insufficient documentation

## 2017-04-05 DIAGNOSIS — M6281 Muscle weakness (generalized): Secondary | ICD-10-CM | POA: Insufficient documentation

## 2017-04-05 DIAGNOSIS — M2560 Stiffness of unspecified joint, not elsewhere classified: Secondary | ICD-10-CM

## 2017-04-05 DIAGNOSIS — M2569 Stiffness of other specified joint, not elsewhere classified: Secondary | ICD-10-CM

## 2017-04-05 DIAGNOSIS — R29898 Other symptoms and signs involving the musculoskeletal system: Secondary | ICD-10-CM | POA: Insufficient documentation

## 2017-04-06 NOTE — Therapy (Signed)
[place], [place], [address] Phone: [phone]   Fax:  [phone]  Physical Therapy Treatment  Patient Details  Name: [name] MRN: [id number] Date of Birth: June 06, 1956 Referring Provider: Dr. Kirtland Bouchard   Encounter Date: 04/05/2017      PT End of Session - 04/05/17 1536    Visit Number 7   Number of Visits 12   Date for PT Re-Evaluation 04/13/17   PT Start Time 0330   PT Stop Time 6063  increased time due to TENS unit malfunction   PT Time Calculation (min) 65 min      Past Medical History:  Diagnosis Date  . Atrial arrhythmia   . CAP (community acquired pneumonia)   . Cellulitis of knee, right 06/06/12  . Chronic otitis externa 06/06/12   C Multiple trials with antibiocs and antifungal. Cultures were positive for Pseudomonas. Follow up with ENT on 06/14/12    . CORONARY ARTERY DISEASE 11/25/2006   Cardiac cath by Dr Terrence Dupont 11/2003 LV showed good LV systolic function, EF of 01-60%. Left main was patent. LAD has 20-30% mid stenosis. Diagonal 1 and diagonal 2 were patent. Left circumflex was patent. OM1 was less than 0.5 mm which was diffusely diseased. OM2 has 20% ostial stenosis which was patent which  was moderate size. OM3 was patent at prior PTCA and stented site. RCA has 20-30% proximal   . Hypertension   . NSTEMI (non-ST elevated myocardial infarction) ([place]) 08/14/2012   S/p Successful PTCA to proximal OM-3     . TOBACCO ABUSE 11/25/2006  . Type II diabetes mellitus ([place])     Past Surgical History:  Procedure Laterality Date  . CORONARY ANGIOPLASTY WITH STENT PLACEMENT  ~ 2005   "1"  . CYSTECTOMY  1990's   LUA  . LEFT HEART CATHETERIZATION WITH CORONARY ANGIOGRAM N/A 08/15/2012   Procedure: LEFT HEART CATHETERIZATION WITH CORONARY ANGIOGRAM;  Surgeon: Clent Demark, MD;  Location: [place] CATH LAB;  Service: Cardiovascular;  Laterality: N/A;  . PERCUTANEOUS CORONARY STENT INTERVENTION (PCI-S)   08/15/2012   Procedure: PERCUTANEOUS CORONARY STENT INTERVENTION (PCI-S);  Surgeon: Clent Demark, MD;  Location: [facility] C[facility] LAB;  Service: Cardiovascular;;    There were no vitals filed for this visit.      Subjective Assessment - 04/05/17 1537    Subjective Pain has been worse this week    Currently in Pain? Yes   Pain Score 5    Pain Orientation Lower;Mid   Pain Descriptors / Indicators Aching;Burning   Pain Radiating Towards Rt leg more    Aggravating Factors  tilling up the garden    Pain Relieving Factors HEP, stretching, heat, PT                          [facility] Adult PT Treatment/Exercise - 04/06/17 0001      Self-Care   Self-Care Other Self-Care Comments   Other Self-Care Comments  Use of TENS unit, Discussed contraindications, precautons, don/doffing, instruction to wife.      Lumbar Exercises: Stretches   Prone on Elbows Stretch 2 reps;60 seconds     Lumbar Exercises: Prone   Other Prone Lumbar Exercises Tra contract with h/s curls, donkey kicks x10 each, only 3 on right due to weakness and increased t low back pain , Prone Tra contract with Hip IR/ER      Traction   Type of Traction Lumbar   Min (  lbs) 75   Max (lbs) 100   Hold Time 60   Rest Time 15   Time 15     Manual Therapy   Passive ROM hip ER and IR with piriformis compression                   PT Short Term Goals - 03/28/17 1440      PT SHORT TERM GOAL #1   Title Pt will be independent with HEP    Status Achieved     PT SHORT TERM GOAL #2   Title Pt will verbalize centralization of pain to low back   Baseline had some this AM is intermittent about 3-4 days per week in AM    Status Partially Met           PT Long Term Goals - 03/28/17 1441      PT LONG TERM GOAL #1   Title Pt will be I with full HEP and do regularly for maximal recovery.    Status On-going     PT LONG TERM GOAL #2   Title Pt will be able to climb ladder for work activities with minimal back  discomfort   Status Achieved     PT LONG TERM GOAL #3   Title Pt will verbalize ability to incorporate stretches into day to avoid excessive pain in the evenings   Status Partially Met     PT LONG TERM GOAL #4   Title Pt will verbalize average pain <5/10 in low back to decrease effects of pain on daily activities   Baseline improving    Status Partially Met     PT LONG TERM GOAL #5   Title FOTO to 65% ability to indicate significant functional improvement   Status Unable to assess               Plan - 04/06/17 1006    Clinical Impression Statement Increased pain after tilling the garden and planting. Felt he had to do it. Worked in  prone for therex and performed traction per pt request. Instructed pt in TENS unit use however onlt able to use one setting due to lack of charge in battery. Pt to try it at home and bring it back to next appt.    PT Next Visit Plan Practice lifting for work simulation, ladder? Freemotion for functional strengthening if pt's pain is controled, Cont with core, stretching. PATIENT TO BRING TENS FOR FURTHER INSTRUCTION    PT Home Exercise Plan PPT, knee to chest, double knee to chest and lower ab , hamstring stretch and bridge.    Consulted and Agree with Plan of Care Patient   Family Member Consulted wife       Patient will benefit from skilled therapeutic intervention in order to improve the following deficits and impairments:  Decreased mobility, Decreased strength, Improper body mechanics, Impaired flexibility, Pain, Increased fascial restricitons, Difficulty walking  Visit Diagnosis: Muscle weakness (generalized)  Radiculopathy, lumbar region  Chronic bilateral low back pain with right-sided sciatica  Decreased range of motion of trunk and back     Problem List Patient Active Problem List   Diagnosis Date Noted  . Need for vaccination with 13-polyvalent pneumococcal conjugate vaccine 12/03/2016  . Hematuria, gross 09/10/2016  . Oral  candidiasis 09/10/2016  . Diarrhea 09/10/2016  . Sensation of foreign body in eye 01/05/2016  . Chronic anticoagulation (Xarelto) 12/26/2015  . Right lumbar radiculitis 11/19/2014  . Insomnia 05/22/2014  . Healthcare maintenance  12/13/2013  . Greater trochanteric bursitis of left hip 05/16/2013  . Dysphagia, unspecified(787.20) 05/10/2013  . Lumbosacral spondylosis without myelopathy 04/18/2013  . Lumbar facet arthropathy ([place]) 04/18/2013  . Sacroiliac joint dysfunction of left side 02/21/2013  . Leg length discrepancy 02/21/2013  . Seborrhea capitis 01/23/2013  . NSTEMI (non-ST elevated myocardial infarction) ([place]) 08/14/2012  . Tobacco abuse 08/08/2012  . Hypertension 09/25/2011  . GERD 04/08/2010  . Chronic radicular low back pain 07/20/2007  . Diabetes mellitus type 2, controlled ([place]) 11/25/2006  . HYPOGONADISM 11/25/2006  . Coronary atherosclerosis 11/25/2006  . Atrial fibrillation ([place]) 11/25/2006    Dorene Ar, PTA 04/06/2017, 10:09 AM  [facility] [address] [place], [place], [address] Phone: [phone]   Fax:  [phone]  Name: [name] MRN: [id number] Date of Birth: 29-Jul-1956

## 2017-04-07 ENCOUNTER — Ambulatory Visit: Payer: BLUE CROSS/BLUE SHIELD | Admitting: Physical Therapy

## 2017-04-07 DIAGNOSIS — G8929 Other chronic pain: Secondary | ICD-10-CM

## 2017-04-07 DIAGNOSIS — M5416 Radiculopathy, lumbar region: Secondary | ICD-10-CM

## 2017-04-07 DIAGNOSIS — M5441 Lumbago with sciatica, right side: Secondary | ICD-10-CM

## 2017-04-07 DIAGNOSIS — M6281 Muscle weakness (generalized): Secondary | ICD-10-CM | POA: Diagnosis not present

## 2017-04-07 NOTE — Patient Instructions (Signed)
TENS stands for Transcutaneous Electrical Nerve Stimulation. In other words, electrical impulses are allowed to pass through the skin in order to excite a nerve.   Purpose and Use of TENS:  TENS is a method used to manage acute and chronic pain without the use of drugs. It has been effective in managing pain associated with surgery, sprains, strains, trauma, rheumatoid arthritis, and neuralgias. It is a non-addictive, low risk, and non-invasive technique used to control pain. It is not, by any means, a curative form of treatment.   How TENS Works:  Most TENS units are a Paramedic unit powered by one 9 volt battery. Attached to the outside of the unit are two lead wires where two pins and/or snaps connect on each wire. All units come with a set of four reusable pads or electrodes. These are placed on the skin surrounding the area involved. By inserting the leads into  the pads, the electricity can pass from the unit making the circuit complete.  As the intensity is turned up slowly, the electrical current enters the body from the electrodes through the skin to the surrounding nerve fibers. This triggers the release of hormones from within the body. These hormones contain pain relievers. By increasing the circulation of these hormones, the person's pain may be lessened. It is also believed that the electrical stimulation itself helps to block the pain messages being sent to the brain, thus also decreasing the body's perception of pain.   Hazards:  TENS units are NOT to be used by patients with PACEMAKERS, DEFIBRILLATORS, DIABETIC PUMPS, PREGNANT WOMEN, and patients with SEIZURE DISORDERS.  TENS units are NOT to be used over the heart, throat, brain, or spinal cord.  One of the major side effects from the TENS unit may be skin irritation. Some people may develop a rash if they are sensitive to the materials used in the electrodes or the connecting wires.   Wear the unit for 30 min as needed.    Avoid overuse due the body getting used to the stem making it not as effective over time.

## 2017-04-07 NOTE — Therapy (Addendum)
[place], [place], [address] Phone: [phone]   Fax:  [phone]  Physical Therapy Treatment and Discharge  Patient Details  Name: [name] MRN: [id number] Date of Birth: [birth year] Referring Provider: Dr. Kirtland Bouchard   Encounter Date: 04/07/2017      PT End of Session - 04/07/17 1553    Visit Number 8   Number of Visits 12   Date for PT Re-Evaluation 04/13/17   PT Start Time 8676   PT Stop Time 1640   PT Time Calculation (min) 55 min   Activity Tolerance Patient tolerated treatment well   Behavior During Therapy [facility] for tasks assessed/performed      Past Medical History:  Diagnosis Date  . Atrial arrhythmia   . CAP (community acquired pneumonia)   . Cellulitis of knee, right 06/06/12  . Chronic otitis externa 06/06/12   C Multiple trials with antibiocs and antifungal. Cultures were positive for Pseudomonas. Follow up with ENT on 06/14/12    . CORONARY ARTERY DISEASE 11/25/2006   Cardiac cath by Dr Terrence Dupont 11/2003 LV showed good LV systolic function, EF of 19-50%. Left main was patent. LAD has 20-30% mid stenosis. Diagonal 1 and diagonal 2 were patent. Left circumflex was patent. OM1 was less than 0.5 mm which was diffusely diseased. OM2 has 20% ostial stenosis which was patent which  was moderate size. OM3 was patent at prior PTCA and stented site. RCA has 20-30% proximal   . Hypertension   . NSTEMI (non-ST elevated myocardial infarction) ([place]) 08/14/2012   S/p Successful PTCA to proximal OM-3     . TOBACCO ABUSE 11/25/2006  . Type II diabetes mellitus ([place])     Past Surgical History:  Procedure Laterality Date  . CORONARY ANGIOPLASTY WITH STENT PLACEMENT  ~ 2005   "1"  . CYSTECTOMY  1990's   LUA  . LEFT HEART CATHETERIZATION WITH CORONARY ANGIOGRAM N/A 08/15/2012   Procedure: LEFT HEART CATHETERIZATION WITH CORONARY ANGIOGRAM;  Surgeon: Clent Demark, MD;  Location: [facility] CATH LAB;  Service:  Cardiovascular;  Laterality: N/A;  . PERCUTANEOUS CORONARY STENT INTERVENTION (PCI-S)  08/15/2012   Procedure: PERCUTANEOUS CORONARY STENT INTERVENTION (PCI-S);  Surgeon: Clent Demark, MD;  Location: [facility] CATH LAB;  Service: Cardiovascular;;    There were no vitals filed for this visit.      Subjective Assessment - 04/07/17 1550    Subjective Its been in my leg more lately.  Pt with TENS unit today. Needs a refresher.  Sees Dr. Arturo Morton next week.     Currently in Pain? Yes   Pain Score 5    Pain Location Back   Pain Orientation Right;Left;Lower   Pain Descriptors / Indicators Aching   Pain Type Chronic pain   Pain Radiating Towards Rt. leg    Pain Onset More than a month ago   Pain Frequency Intermittent                [facility] Adult PT Treatment/Exercise - 04/07/17 1553      Self-Care   Self-Care ADL's;Lifting   Lifting using a hoe in garden, avoiding bending to weed   Other Self-Care Comments  Reviewed use of TENS unit, Discussed contraindications, precautons, don/doffing, instruction to wife.   increased time due to unit malfunction and switch out      Traction   Type of Traction Lumbar   Min (lbs) 75   Max (lbs) 100   Hold Time 60  Rest Time 15   Time 15                PT Education - 04/07/17 2151    Education provided Yes   Education Details Home TENS, different units.    Person(s) Educated Patient;Spouse   Methods Explanation;Demonstration;Tactile cues;Verbal cues;Handout   Comprehension Verbalized understanding;Returned demonstration          PT Short Term Goals - 03/28/17 1440      PT SHORT TERM GOAL #1   Title Pt will be independent with HEP    Status Achieved     PT SHORT TERM GOAL #2   Title Pt will verbalize centralization of pain to low back   Baseline had some this AM is intermittent about 3-4 days per week in AM    Status Partially Met           PT Long Term Goals - 04/07/17 2156      PT LONG TERM GOAL #1   Title Pt will  be I with full HEP and do regularly for maximal recovery.    Status On-going     PT LONG TERM GOAL #2   Title Pt will be able to climb ladder for work activities with minimal back discomfort   Status Partially Met     PT LONG TERM GOAL #3   Title Pt will verbalize ability to incorporate stretches into day to avoid excessive pain in the evenings   Status Partially Met     PT LONG TERM GOAL #4   Title Pt will verbalize average pain <5/10 in low back to decrease effects of pain on daily activities   Status Partially Met     PT LONG TERM GOAL #5   Title FOTO to 65% ability to indicate significant functional improvement   Status Unable to assess               Plan - 04/07/17 2152    Clinical Impression Statement Unit patient brought in today was not functioning as needed, it was confusing for the patient.  We changed to a different unit (Med. Modalities) that was simpler and more effective.  He and his wife were able to demo the unit without difficulty.  Discused using good body mechanics ALL the time not just with heavier workloads, IE. bending in the garden and pushing, pulling with a flexed spine.    PT Next Visit Plan TENS follow up.  Work Economist, Economist. Ther ex vs modalities depending on pain. Wrap up and DC 04/13/17.    PT Home Exercise Plan PPT, knee to chest, double knee to chest and lower ab , hamstring stretch and bridge.    Consulted and Agree with Plan of Care Patient   Family Member Consulted wife       Patient will benefit from skilled therapeutic intervention in order to improve the following deficits and impairments:  Decreased mobility, Decreased strength, Improper body mechanics, Impaired flexibility, Pain, Increased fascial restricitons, Difficulty walking  Visit Diagnosis: Muscle weakness (generalized)  Radiculopathy, lumbar region  Chronic bilateral low back pain with right-sided sciatica     Problem List Patient Active Problem List    Diagnosis Date Noted  . Need for vaccination with 13-polyvalent pneumococcal conjugate vaccine 12/03/2016  . Hematuria, gross 09/10/2016  . Oral candidiasis 09/10/2016  . Diarrhea 09/10/2016  . Sensation of foreign body in eye 01/05/2016  . Chronic anticoagulation (Xarelto) 12/26/2015  . Right lumbar radiculitis 11/19/2014  . Insomnia 05/22/2014  .  Healthcare maintenance 12/13/2013  . Greater trochanteric bursitis of left hip 05/16/2013  . Dysphagia, unspecified(787.20) 05/10/2013  . Lumbosacral spondylosis without myelopathy 04/18/2013  . Lumbar facet arthropathy ([place]) 04/18/2013  . Sacroiliac joint dysfunction of left side 02/21/2013  . Leg length discrepancy 02/21/2013  . Seborrhea capitis 01/23/2013  . NSTEMI (non-ST elevated myocardial infarction) ([place]) 08/14/2012  . Tobacco abuse 08/08/2012  . Hypertension 09/25/2011  . GERD 04/08/2010  . Chronic radicular low back pain 07/20/2007  . Diabetes mellitus type 2, controlled ([place]) 11/25/2006  . HYPOGONADISM 11/25/2006  . Coronary atherosclerosis 11/25/2006  . Atrial fibrillation ([place]) 11/25/2006    PAA,JENNIFER 04/07/2017, 10:00 PM  [facility] [address] [place], [place], [address] Phone: [phone]   Fax:  [phone]  Name: IRIS TATSCH MRN: [id number] Date of Birth: [birth year]  Raeford Razor, PT 04/07/17 10:00 PM Phone: [phone] Fax: [phone]  PHYSICAL THERAPY DISCHARGE SUMMARY  Visits from Start of Care: 8  Current functional level related to goals / functional outcomes: See above for most recent info    Remaining deficits: Hip, core weakness when isolated.  Functionally doing well, lives with pain     Education / Equipment: TENS, posture, body mechanics, lifting and HEP  Plan: Patient agrees to discharge.  Patient goals were partially met. Patient is being discharged due to not returning since the last visit.  ?????    Patient called to  cancel his last 2 appts.  Was planning to DC after those.  He is considering neurosurgery.    Raeford Razor, PT 04/21/17 1:37 PM Phone: [phone] Fax: [phone]

## 2017-04-11 ENCOUNTER — Other Ambulatory Visit: Payer: Self-pay | Admitting: Student in an Organized Health Care Education/Training Program

## 2017-04-12 ENCOUNTER — Ambulatory Visit: Payer: BLUE CROSS/BLUE SHIELD | Admitting: Physical Therapy

## 2017-04-13 ENCOUNTER — Ambulatory Visit: Payer: BLUE CROSS/BLUE SHIELD | Admitting: Physical Therapy

## 2017-04-27 ENCOUNTER — Ambulatory Visit (INDEPENDENT_AMBULATORY_CARE_PROVIDER_SITE_OTHER): Payer: BLUE CROSS/BLUE SHIELD | Admitting: Internal Medicine

## 2017-04-27 ENCOUNTER — Encounter: Payer: Self-pay | Admitting: Internal Medicine

## 2017-04-27 DIAGNOSIS — R110 Nausea: Secondary | ICD-10-CM

## 2017-04-27 DIAGNOSIS — R109 Unspecified abdominal pain: Secondary | ICD-10-CM | POA: Diagnosis not present

## 2017-04-27 NOTE — Progress Notes (Signed)
   CC: stomach discomfort   HPI:  [name] is a 61 y.o. with pmh as listed below is here with nausea and stomach discomfort for 3 weeks.  The discomfort feels like "nausea" not really pain, located periumbilically, Has some nausea, no vomitting. No diarrhea, no GI bleed. It starts when he wakes up in the morning, feels it while lying down at night too, lasts throughout the day. Does not change with food intake or moving around, no radiation.   No recent travel, no sick contacts, no dietary changes. He noticed he was taking bactrim given by his ENT doctor for otitis externa which caused some stomach problems, he stopped taking it 1 week ago. He is also on morphine for chronic back pain. No constipation.   Past Medical History:  Diagnosis Date  . Atrial arrhythmia   . CAP (community acquired pneumonia)   . Cellulitis of knee, right 06/06/12  . Chronic otitis externa 06/06/12   C Multiple trials with antibiocs and antifungal. Cultures were positive for Pseudomonas. Follow up with ENT on 06/14/12    . CORONARY ARTERY DISEASE 11/25/2006   Cardiac cath by Dr Terrence Dupont 11/2003 LV showed good LV systolic function, EF of 73-56%. Left main was patent. LAD has 20-30% mid stenosis. Diagonal 1 and diagonal 2 were patent. Left circumflex was patent. OM1 was less than 0.5 mm which was diffusely diseased. OM2 has 20% ostial stenosis which was patent which  was moderate size. OM3 was patent at prior PTCA and stented site. RCA has 20-30% proximal   . Hypertension   . NSTEMI (non-ST elevated myocardial infarction) ([place]) 08/14/2012   S/p Successful PTCA to proximal OM-3     . TOBACCO ABUSE 11/25/2006  . Type II diabetes mellitus ([place])     Review of Systems:   Review of Systems  Constitutional: Negative for chills and fever.  Cardiovascular: Negative for chest pain and palpitations.  Gastrointestinal: Positive for nausea. Negative for abdominal pain, blood in stool, constipation, diarrhea, heartburn and  vomiting.  Neurological: Negative for dizziness and headaches.     Physical Exam:  Vitals:   04/27/17 1350  BP: 126/67  Pulse: (!) 59  Temp: 98.1 F (36.7 C)  TempSrc: Oral  SpO2: 97%  Weight: 265 lb 8 oz (120.4 kg)  Height: 6\' 3"  (1.905 m)   Physical Exam  Constitutional: He is oriented to person, place, and time. He appears well-developed and well-nourished.  HENT:  Head: Normocephalic and atraumatic.  Respiratory: Effort normal and breath sounds normal. No respiratory distress. He has no wheezes.  GI: Soft. Bowel sounds are normal. He exhibits no distension. There is no tenderness.  Musculoskeletal: Normal range of motion. He exhibits no edema.  Neurological: He is alert and oriented to person, place, and time.  Psychiatric: He has a normal mood and affect.    Assessment & Plan:   See Encounters Tab for problem based charting.  Patient discussed with Dr. Evette Doffing

## 2017-04-27 NOTE — Patient Instructions (Signed)
Continue to monitor your symptom.  Let us know if it gets worse.

## 2017-04-27 NOTE — Assessment & Plan Note (Signed)
Unclear etiology of abdominal discomfort/nausea x 3 weeks. Not associated with any food intake, nothing really changes the symptoms. This could be 2/2 to medications including recent use of bactrim (already stopped it 1 week ago) or could be from morphine which he takes chronically for last 6 months. He has actually maintained his weight since last visit on January and is able to keep down food. This overall seems mild.   We discussed possible referral to GI for EGD but patient deferred that for now.  Will continue to monitor as the sx are mild and he is not losing weight.

## 2017-04-28 NOTE — Progress Notes (Signed)
Internal Medicine Clinic Attending  Case discussed with Dr. Ahmed at the time of the visit.  We reviewed the resident's history and exam and pertinent patient test results.  I agree with the assessment, diagnosis, and plan of care documented in the resident's note. 

## 2017-05-16 ENCOUNTER — Ambulatory Visit (INDEPENDENT_AMBULATORY_CARE_PROVIDER_SITE_OTHER): Payer: BLUE CROSS/BLUE SHIELD | Admitting: Internal Medicine

## 2017-05-16 ENCOUNTER — Encounter: Payer: Self-pay | Admitting: Internal Medicine

## 2017-05-16 VITALS — BP 126/66 | HR 74 | Temp 98.7°F | Ht 75.0 in | Wt 257.9 lb

## 2017-05-16 DIAGNOSIS — R197 Diarrhea, unspecified: Secondary | ICD-10-CM

## 2017-05-16 DIAGNOSIS — R5383 Other fatigue: Secondary | ICD-10-CM

## 2017-05-16 DIAGNOSIS — Z86010 Personal history of colonic polyps: Secondary | ICD-10-CM | POA: Diagnosis not present

## 2017-05-16 DIAGNOSIS — R634 Abnormal weight loss: Secondary | ICD-10-CM

## 2017-05-16 DIAGNOSIS — R1031 Right lower quadrant pain: Secondary | ICD-10-CM | POA: Diagnosis not present

## 2017-05-16 DIAGNOSIS — R110 Nausea: Secondary | ICD-10-CM | POA: Diagnosis not present

## 2017-05-16 DIAGNOSIS — R109 Unspecified abdominal pain: Secondary | ICD-10-CM

## 2017-05-16 DIAGNOSIS — Z8719 Personal history of other diseases of the digestive system: Secondary | ICD-10-CM

## 2017-05-16 DIAGNOSIS — R1032 Left lower quadrant pain: Secondary | ICD-10-CM | POA: Diagnosis not present

## 2017-05-16 LAB — GLUCOSE, CAPILLARY: Glucose-Capillary: 78 mg/dL (ref 65–99)

## 2017-05-16 LAB — POCT GLYCOSYLATED HEMOGLOBIN (HGB A1C): HEMOGLOBIN A1C: 6.5

## 2017-05-16 MED ORDER — ONDANSETRON HCL 4 MG PO TABS: 4.0000 mg | ORAL_TABLET | Freq: Three times a day (TID) | ORAL | 0 refills | Status: DC | PRN

## 2017-05-16 NOTE — Patient Instructions (Signed)
Mr. Champney it was nice meeting you today.  -I have ordered some labs and will call you when I get the results.  -Take Zofran as needed for nausea  -Please call the clinic if you start having diarrhea again as additional lab studies will have to be ordered.  -Return for a follow-up visit in 4 weeks.

## 2017-05-16 NOTE — Assessment & Plan Note (Addendum)
History of present illness Patient reports having intermittent bilateral lower abdominal discomfort, diarrhea, nausea, and fatigue for over a month. Denies having any episodes of vomiting. He does not believe that his symptoms are related to eating. States he was not able to tolerate much of a by mouth intake until yesterday when he had chicken soup. His last episode of diarrhea was 2 days ago and he has not had any more bowel movements since then. Denies any recent travel, trying new foods, or change in his drinking water. States his diarrhea was watery, foul-smelling, and would sometimes stick to the toilet. Denies having any episodes of fecal incontinence. Reports having bloody bowel movements "for a long time" which he attributes to taking Xarelto for his A. Fib. He was not able to give me more details about his hematochezia. States his lower abdominal discomfort is tolerable as he has not required any pain medications. Denies having any abdominal bloating or flatus. Does report having life stressors. States his abdominal discomfort is worse in the morning. Denies having any alleviating factors. He has tried over-the-counter Pepto-Bismol and Alka-Seltzer but to no avail. Denies any prior history of abdominal surgeries or radiation. Denies any laxative use. Denies any history of a thyroid disorder. He has been taking metformin for several years for his diabetes and states he never had any problems tolerating this medication. Denies any family history of colon cancer. Denies having any dysphagia, odynophagia, GERD, or dyspepsia symptoms.  Assessment Patient's history of hematochezia and fatigue are concerning for possible colon cancer. In addition, although acute, patient has lost 8 pounds in the past 18 days (per chart review). Colonoscopy done in December 2014 revealed 2 sessile polyps (5-8 mm) in the transverse colon and also 2 hyperplastic polyps. Biopsy result showing sessile serrated adenoma and mucosal  prolapse polyp. Repeat colonoscopy was recommended in 5 years. Inflammatory bowel disease is also in the differential for weight loss, hematochezia, diarrhea, and lower abdominal discomfort. A malabsorption syndrome is also a possibility as patient reports having foul-smelling, fatty stools. Antibiotic exposure to Bactrim last month also puts him at risk for possible infectious diarrhea (C. Difficile). Hyperthyroidism needs to be ruled out as it could explain diarrhea and weight loss. The etiology of his nausea is unclear as patient does not endorse any symptoms of GERD/ dyspepsia.   Plan -CBC to check hemoglobin and white count -CMP to check electrolytes and nutritional status -TSH to rule out hyperthyroidism -Referral to GI for repeat colonoscopy -Zofran as needed for nausea -No stool studies have been ordered at this time since patient's diarrhea resolved 2 days ago. He has been advised to call the clinic if he starts having diarrhea again. In that case, consider ordering the following studies: -Stool fat (quantitative) to check for malabsorption -Stool pH to check for carbohydrate malabsorption -Stool lactoferrin to rule out an infectious cause of diarrhea -Stool sodium and potassium to check an osmolar gap to help differentiate between secretory versus osmotic etiology diarrhea  Addendum 05/17/2017 at 8:51 AM: Hemoglobin 13.0 and white count normal. Sodium borderline increased at 145. Albumin normal. TSH low at 0.331. Spoke to the patient over the phone and discussed lab results with him. Advised him to stay hydrated.  -Check free T4  Addendum 05/18/2017 at 5:52 PM: Free T4 normal. Patient's elevated TSH and normal free T4 likely reflect subclinical hyperthyroidism. -Recheck thyroid studies in 2-3 months. Spoke to the patient over the phone and he agrees with the plan.

## 2017-05-16 NOTE — Progress Notes (Signed)
   CC: Patient is here for a follow-up of his abdominal discomfort, diarrhea, and nausea. Labs have been requested by cardiology.  HPI:  [name] is a 61 y.o. male with a past medical history of conditions listed below presenting to the clinic for a follow-up of his abdominal discomfort, diarrhea, and nausea. Patient also had a prescription with him signed by Dr. Terrence Dupont (cardiology) requesting the following labs to be done: hepatic function panel, basic metabolic panel, and hemoglobin A1c. Lab results will be forwarded to cardiology. Please see problem based charting for the status of the patient's current and chronic medical conditions.   Past Medical History:  Diagnosis Date  . Atrial arrhythmia   . CAP (community acquired pneumonia)   . Cellulitis of knee, right 06/06/12  . Chronic otitis externa 06/06/12   C Multiple trials with antibiocs and antifungal. Cultures were positive for Pseudomonas. Follow up with ENT on 06/14/12    . CORONARY ARTERY DISEASE 11/25/2006   Cardiac cath by Dr Terrence Dupont 11/2003 LV showed good LV systolic function, EF of 95-28%. Left main was patent. LAD has 20-30% mid stenosis. Diagonal 1 and diagonal 2 were patent. Left circumflex was patent. OM1 was less than 0.5 mm which was diffusely diseased. OM2 has 20% ostial stenosis which was patent which  was moderate size. OM3 was patent at prior PTCA and stented site. RCA has 20-30% proximal   . Hypertension   . NSTEMI (non-ST elevated myocardial infarction) ([place]) 08/14/2012   S/p Successful PTCA to proximal OM-3     . TOBACCO ABUSE 11/25/2006  . Type II diabetes mellitus ([place])     Review of Systems: Pertinent positives mentioned in HPI. Patient denies having any shortness of breath or cough. Remainder of all ROS negative.   Physical Exam:  Vitals:   05/16/17 1423  BP: 126/66  Pulse: 74  Temp: 98.7 F (37.1 C)  TempSrc: Oral  SpO2: 96%  Weight: 257 lb 14.4 oz (117 kg)  Height: 6\' 3"  (1.905 m)   Physical  Exam  Constitutional: He is oriented to person, place, and time. He appears well-developed and well-nourished. No distress.  HENT:  Head: Normocephalic and atraumatic.  Eyes: Right eye exhibits no discharge. Left eye exhibits no discharge.  Cardiovascular: Normal rate, regular rhythm and intact distal pulses.   Pulmonary/Chest: Effort normal and breath sounds normal. No respiratory distress. He has no wheezes.  Mild crackles appreciated at the left lung base.  Abdominal: Soft. Bowel sounds are normal. He exhibits no distension. There is no tenderness.  Musculoskeletal: He exhibits no edema.  Neurological: He is alert and oriented to person, place, and time.  Skin: Skin is warm and dry.    Assessment & Plan:   See Encounters Tab for problem based charting.  Patient discussed with Dr. Daryll Drown

## 2017-05-17 LAB — CBC WITH DIFFERENTIAL/PLATELET
BASOS: 0 %
Basophils Absolute: 0 10*3/uL (ref 0.0–0.2)
EOS (ABSOLUTE): 0.1 10*3/uL (ref 0.0–0.4)
EOS: 2 %
HEMATOCRIT: 40.9 % (ref 37.5–51.0)
HEMOGLOBIN: 13 g/dL (ref 13.0–17.7)
IMMATURE GRANULOCYTES: 0 %
Immature Grans (Abs): 0 10*3/uL (ref 0.0–0.1)
LYMPHS ABS: 2.9 10*3/uL (ref 0.7–3.1)
Lymphs: 48 %
MCH: 28 pg (ref 26.6–33.0)
MCHC: 31.8 g/dL (ref 31.5–35.7)
MCV: 88 fL (ref 79–97)
MONOCYTES: 8 %
Monocytes Absolute: 0.5 10*3/uL (ref 0.1–0.9)
Neutrophils Absolute: 2.5 10*3/uL (ref 1.4–7.0)
Neutrophils: 42 %
Platelets: 143 10*3/uL — ABNORMAL LOW (ref 150–379)
RBC: 4.64 x10E6/uL (ref 4.14–5.80)
RDW: 15.4 % (ref 12.3–15.4)
WBC: 6 10*3/uL (ref 3.4–10.8)

## 2017-05-17 LAB — TSH: TSH: 0.331 u[IU]/mL — ABNORMAL LOW (ref 0.450–4.500)

## 2017-05-17 LAB — CMP14 + ANION GAP
ALBUMIN: 3.8 g/dL (ref 3.6–4.8)
ALK PHOS: 65 IU/L (ref 39–117)
ALT: 16 IU/L (ref 0–44)
ANION GAP: 15 mmol/L (ref 10.0–18.0)
AST: 19 IU/L (ref 0–40)
Albumin/Globulin Ratio: 1.6 (ref 1.2–2.2)
BUN / CREAT RATIO: 19 (ref 10–24)
BUN: 17 mg/dL (ref 8–27)
Bilirubin Total: 0.3 mg/dL (ref 0.0–1.2)
CO2: 25 mmol/L (ref 20–29)
CREATININE: 0.9 mg/dL (ref 0.76–1.27)
Calcium: 9.1 mg/dL (ref 8.6–10.2)
Chloride: 105 mmol/L (ref 96–106)
GFR calc Af Amer: 107 mL/min/{1.73_m2} (ref >59)
GFR calc non Af Amer: 93 mL/min/{1.73_m2} (ref >59)
Globulin, Total: 2.4 g/dL (ref 1.5–4.5)
Glucose: 74 mg/dL (ref 65–99)
Potassium: 4.1 mmol/L (ref 3.5–5.2)
Sodium: 145 mmol/L — ABNORMAL HIGH (ref 134–144)
TOTAL PROTEIN: 6.2 g/dL (ref 6.0–8.5)

## 2017-05-17 NOTE — Addendum Note (Signed)
Addended by: Shela Leff on: 05/17/2017 09:06 AM   Modules accepted: Orders

## 2017-05-19 NOTE — Progress Notes (Signed)
Internal Medicine Clinic Attending  Case discussed with Dr. Rathoreat the time of the visit. We reviewed the resident's history and exam and pertinent patient test results. I agree with the assessment, diagnosis, and plan of care documented in the resident's note.  

## 2017-05-20 LAB — T4, FREE: FREE T4: 1.63 ng/dL (ref 0.82–1.77)

## 2017-05-20 LAB — SPECIMEN STATUS REPORT

## 2017-06-13 ENCOUNTER — Telehealth: Payer: Self-pay | Admitting: Internal Medicine

## 2017-06-16 NOTE — Telephone Encounter (Signed)
Needs an appt

## 2017-06-16 NOTE — Telephone Encounter (Signed)
Just spoke with patient. He agreed to an appointment for 06/20/17 at 9:15 am in [facility].

## 2017-06-20 ENCOUNTER — Ambulatory Visit (INDEPENDENT_AMBULATORY_CARE_PROVIDER_SITE_OTHER): Payer: BLUE CROSS/BLUE SHIELD | Admitting: Internal Medicine

## 2017-06-20 ENCOUNTER — Encounter: Payer: Self-pay | Admitting: Internal Medicine

## 2017-06-20 VITALS — BP 133/77 | HR 56 | Temp 97.9°F | Ht 75.0 in | Wt 252.5 lb

## 2017-06-20 DIAGNOSIS — D649 Anemia, unspecified: Secondary | ICD-10-CM | POA: Diagnosis not present

## 2017-06-20 DIAGNOSIS — F17210 Nicotine dependence, cigarettes, uncomplicated: Secondary | ICD-10-CM | POA: Diagnosis not present

## 2017-06-20 DIAGNOSIS — Z809 Family history of malignant neoplasm, unspecified: Secondary | ICD-10-CM | POA: Diagnosis not present

## 2017-06-20 DIAGNOSIS — Z79899 Other long term (current) drug therapy: Secondary | ICD-10-CM | POA: Diagnosis not present

## 2017-06-20 DIAGNOSIS — R1032 Left lower quadrant pain: Secondary | ICD-10-CM

## 2017-06-20 DIAGNOSIS — R1031 Right lower quadrant pain: Secondary | ICD-10-CM

## 2017-06-20 DIAGNOSIS — Z881 Allergy status to other antibiotic agents status: Secondary | ICD-10-CM | POA: Diagnosis not present

## 2017-06-20 DIAGNOSIS — R109 Unspecified abdominal pain: Secondary | ICD-10-CM

## 2017-06-20 MED ORDER — PROMETHAZINE HCL 12.5 MG PO TABS: 12.5000 mg | ORAL_TABLET | Freq: Four times a day (QID) | ORAL | 0 refills | Status: DC | PRN

## 2017-06-20 NOTE — Progress Notes (Signed)
   CC: Abdominal pain  HPI:  [name] is a 61 y.o. with history noted below that presents to the acute care clinic for abdominal pain. He reports bilateral lower abdominal pain that started on Friday. He states that it is constant and does not radiate. He reports that it is not associated with food intake.  He states his last bowel movement was yesterday.  He denies fever/chills, vomiting, blood in stool, diarrhea or constipation. He states he has seen a gastroenterologist in the past month for endoscopy and colonoscopy and states he was told results were normal.  Past Medical History:  Diagnosis Date  . Atrial arrhythmia   . CAP (community acquired pneumonia)   . Cellulitis of knee, right 06/06/12  . Chronic otitis externa 06/06/12   C Multiple trials with antibiocs and antifungal. Cultures were positive for Pseudomonas. Follow up with ENT on 06/14/12    . CORONARY ARTERY DISEASE 11/25/2006   Cardiac cath by Dr Terrence Dupont 11/2003 LV showed good LV systolic function, EF of 67-12%. Left main was patent. LAD has 20-30% mid stenosis. Diagonal 1 and diagonal 2 were patent. Left circumflex was patent. OM1 was less than 0.5 mm which was diffusely diseased. OM2 has 20% ostial stenosis which was patent which  was moderate size. OM3 was patent at prior PTCA and stented site. RCA has 20-30% proximal   . Hypertension   . NSTEMI (non-ST elevated myocardial infarction) ([place]) 08/14/2012   S/p Successful PTCA to proximal OM-3     . TOBACCO ABUSE 11/25/2006  . Type II diabetes mellitus ([place])     Review of Systems:  Review of Systems  Respiratory: Negative for shortness of breath.   Cardiovascular: Negative for chest pain.  Gastrointestinal: Positive for abdominal pain and nausea. Negative for blood in stool, constipation, diarrhea and vomiting.  Genitourinary: Negative for flank pain.     Physical Exam:  Vitals:   06/20/17 0909  BP: 133/77  Pulse: (!) 56  Temp: 97.9 F (36.6 C)  TempSrc: Oral    SpO2: 98%  Weight: 252 lb 8 oz (114.5 kg)  Height: 6\' 3"  (1.905 m)   Physical Exam  Cardiovascular: Normal rate, regular rhythm and normal heart sounds.  Exam reveals no gallop and no friction rub.   No murmur heard. Pulmonary/Chest: Effort normal.  Abdominal: Soft. Bowel sounds are normal. He exhibits no distension.  Tenderness to lower quadrants bilaterally  Musculoskeletal: He exhibits no edema.  Skin: Skin is warm and dry.    Assessment & Plan:   See encounters tab for problem based medical decision making.    Patient discussed with Dr. Lynnae January

## 2017-06-20 NOTE — Patient Instructions (Signed)
Mr. Mclear,  It was a pleasure meeting you today. Please take Phenergan to help with her nausea. Please follow up with her gastroenterologist for your abdominal pain. Please return to the clinic if your symptoms do not improve or worsen, develop fever/chills, or vomiting.

## 2017-06-21 DIAGNOSIS — D649 Anemia, unspecified: Secondary | ICD-10-CM | POA: Insufficient documentation

## 2017-06-21 LAB — CBC
Hematocrit: 43.9 % (ref 37.5–51.0)
Hemoglobin: 14.8 g/dL (ref 13.0–17.7)
MCH: 28.5 pg (ref 26.6–33.0)
MCHC: 33.7 g/dL (ref 31.5–35.7)
MCV: 84 fL (ref 79–97)
PLATELETS: 176 10*3/uL (ref 150–379)
RBC: 5.2 x10E6/uL (ref 4.14–5.80)
RDW: 14.8 % (ref 12.3–15.4)
WBC: 5 10*3/uL (ref 3.4–10.8)

## 2017-06-21 LAB — IRON AND TIBC
IRON: 68 ug/dL (ref 38–169)
Iron Saturation: 22 % (ref 15–55)
TIBC: 312 ug/dL (ref 250–450)
UIBC: 244 ug/dL (ref 111–343)

## 2017-06-21 LAB — FERRITIN: FERRITIN: 71 ng/mL (ref 30–400)

## 2017-06-21 NOTE — Assessment & Plan Note (Signed)
Assessment: bilateral lower abdominal pain Patient has been evaluated in the clinic on 04/27/17 and 05/16/17 for same symptoms.  Patient was referred to GI for colonoscopy and endoscopy on last visit.  Results of the procedures noted internal hemorrhoids.  With no signs of infection, wbc within normal limits and no fever, no signs of bowel obstruction (having bowel movements no vomiting) I suspect the patient's pain mat be related to spasms occurring in the colon.  Told patient to make a follow up appointment to GI to continue to discuss his chronic abdominal pain.  Plan -phenergan  - follow up with GI

## 2017-06-21 NOTE — Assessment & Plan Note (Signed)
Assessment:  History of normocytic anemia Patient has had fluctuations with hemoglobin over the past several years.  Ferritin has never been checked.  Will rule out IDA with ferritin  Plan -cbc -ferritin

## 2017-06-22 NOTE — Progress Notes (Signed)
Internal Medicine Clinic Attending  Case discussed with Dr. Hoffman at the time of the visit.  We reviewed the resident's history and exam and pertinent patient test results.  I agree with the assessment, diagnosis, and plan of care documented in the resident's note.  

## 2017-06-23 ENCOUNTER — Other Ambulatory Visit: Payer: Self-pay | Admitting: *Deleted

## 2017-06-23 MED ORDER — ISOSORBIDE MONONITRATE ER 30 MG PO TB24: 30.0000 mg | ORAL_TABLET | Freq: Every day | ORAL | 3 refills | Status: DC

## 2017-07-23 ENCOUNTER — Other Ambulatory Visit: Payer: Self-pay | Admitting: Internal Medicine

## 2017-07-23 DIAGNOSIS — I4891 Unspecified atrial fibrillation: Secondary | ICD-10-CM

## 2017-07-26 ENCOUNTER — Other Ambulatory Visit: Payer: Self-pay | Admitting: Internal Medicine

## 2017-07-26 DIAGNOSIS — E119 Type 2 diabetes mellitus without complications: Secondary | ICD-10-CM

## 2017-08-22 ENCOUNTER — Ambulatory Visit (INDEPENDENT_AMBULATORY_CARE_PROVIDER_SITE_OTHER): Payer: BLUE CROSS/BLUE SHIELD | Admitting: Internal Medicine

## 2017-08-22 VITALS — BP 107/65 | HR 57 | Temp 97.4°F | Wt 260.8 lb

## 2017-08-22 DIAGNOSIS — Z23 Encounter for immunization: Secondary | ICD-10-CM

## 2017-08-22 DIAGNOSIS — L84 Corns and callosities: Secondary | ICD-10-CM

## 2017-08-22 DIAGNOSIS — Z7901 Long term (current) use of anticoagulants: Secondary | ICD-10-CM

## 2017-08-22 DIAGNOSIS — E1142 Type 2 diabetes mellitus with diabetic polyneuropathy: Secondary | ICD-10-CM

## 2017-08-22 DIAGNOSIS — F17210 Nicotine dependence, cigarettes, uncomplicated: Secondary | ICD-10-CM

## 2017-08-22 DIAGNOSIS — I10 Essential (primary) hypertension: Secondary | ICD-10-CM | POA: Diagnosis not present

## 2017-08-22 DIAGNOSIS — K0889 Other specified disorders of teeth and supporting structures: Secondary | ICD-10-CM | POA: Diagnosis not present

## 2017-08-22 DIAGNOSIS — I4891 Unspecified atrial fibrillation: Secondary | ICD-10-CM

## 2017-08-22 DIAGNOSIS — L219 Seborrheic dermatitis, unspecified: Secondary | ICD-10-CM

## 2017-08-22 DIAGNOSIS — B351 Tinea unguium: Secondary | ICD-10-CM | POA: Diagnosis not present

## 2017-08-22 DIAGNOSIS — L218 Other seborrheic dermatitis: Secondary | ICD-10-CM

## 2017-08-22 DIAGNOSIS — I2510 Atherosclerotic heart disease of native coronary artery without angina pectoris: Secondary | ICD-10-CM

## 2017-08-22 DIAGNOSIS — G629 Polyneuropathy, unspecified: Secondary | ICD-10-CM | POA: Insufficient documentation

## 2017-08-22 NOTE — Patient Instructions (Signed)
We are referring you to a foot doctor. Try to wear cushioned shoes like tennis shoes to relieve your pain.  For your tooth pain, please see a dentist. You should have a list of dentists that are covered by your insurance. If you develop fevers, chills, swelling, are unable to eat or drink due to the pain, please return to clinic for re-evaluation.

## 2017-08-22 NOTE — Progress Notes (Signed)
   CC: foot pain  HPI:  TyroneTyrone Moody is a 61 y.o. with a PMH of T2DM, HTN, A fib on chronic anticoagulation, CAD presenting to clinic for foot pain.  Foot pain: [name] endorses 2 wk h/o plantar foot pain that is exacerbated by weight bearing. He endorses associated swelling and states the pain is pressure-like. He denies trauma, injury, redness, fevers, chills. Wearing tennis shoes helps with the pain.   Tooth pain:  [name] that is exacerbated by pressure, heat and cold. He denies fevers, chills, lymphadenopathy.   Seborrheic dermatitis of bilateral ears: [name] with extensive involvement of external ears with crusting and flaking. He has seen ENT for this previously who had started a topical medicine ([name] unsure if steroid or antifungal). [name].   Please see problem based Assessment and Plan for status of patients chronic conditions.  Past Medical History:  Diagnosis Date  . Atrial arrhythmia   . CAP (community acquired pneumonia)   . Cellulitis of knee, right 06/06/12  . Chronic otitis externa 06/06/12   C Multiple trials with antibiocs and antifungal. Cultures were positive for Pseudomonas. Follow up with ENT on 06/14/12    . CORONARY ARTERY DISEASE 12/Moody/2007   Cardiac cath by Dr Terrence Dupont 11/2003 LV showed good LV systolic function, EF of 74-94%. Left main was patent. LAD has 20-30% mid stenosis. Diagonal 1 and diagonal 2 were patent. Left circumflex was patent. OM1 was less than 0.5 mm which was diffusely diseased. OM2 has 20% ostial stenosis which was patent which  was moderate size. OM3 was patent at prior PTCA and stented site. RCA has 20-30% proximal   . Hypertension   . NSTEMI (non-ST elevated myocardial infarction) ([place]) 08/14/2012   S/p Successful PTCA to proximal OM-3     . TOBACCO ABUSE 12/Moody/2007  . Type II diabetes mellitus (HCC)     Review of Systems:   ROS Per HPI  Physical  Exam:  Vitals:   08/22/17 1332  BP: 107/65  Pulse: (!) 57  Temp: (!) 97.4 F (36.3 C)  TempSrc: Oral  SpO2: 99%  Weight: 260 lb 12.8 oz (118.3 kg)   GENERAL- alert, co-operative, appears as stated age, not in any distress. HEENT- Atraumatic, normocephalic, extensive seborrheic dermatitis on external ears with flaking and some crusting, implant from tooth Moody missing; mild tenderness of surrounding gum line without abscess; no cervical, submandibular or submental lymphadenopathy. CARDIAC- RRR, no murmurs, rubs or gallops. RESP- Moving equal volumes of air, and clear to auscultation bilaterally, no wheezes or crackles. NEURO- no sensation in left plantar aspect of first MTP joint, otherwise monofilament sensation intact in left foot  EXTREMITIES- pulse 2+, symmetric, no pedal edema. Onychomycosis on bil LEs SKIN- Warm, dry, no rash. Area of early callus development over posterior aspect of 2nd and 3rd MTP joints on left foot with pain on palpation; no skin break. PSYCH- Normal mood and affect, appropriate thought content and speech.  Assessment & Plan:   See Encounters Tab for problem based charting.   [name] seen with Dr. Nilsa Nutting, MD Internal Medicine PGY2

## 2017-08-23 ENCOUNTER — Encounter: Payer: Self-pay | Admitting: Internal Medicine

## 2017-08-23 DIAGNOSIS — L219 Seborrheic dermatitis, unspecified: Secondary | ICD-10-CM | POA: Insufficient documentation

## 2017-08-23 NOTE — Assessment & Plan Note (Signed)
Patient endorses 2 wk h/o plantar foot pain that is exacerbated by weight bearing. He endorses associated swelling and states the pain is pressure-like. He denies trauma, injury, redness, fevers, chills. Wearing tennis shoes helps with the pain.   Exam reveals lack of sensation with monofilament testing of left plantar surface of 1st MPT joint; also early callous formation that is TTP overlying posterior 2nd and 3rd MTP joints on the left foot which is causing his foot pain. No evidence of fracture, ulcer, infection.  Plan: --refer to podiatry for comprehensive diabetic foot care including management of severe onychomycosis --advised patient to wear supportive shoes

## 2017-08-23 NOTE — Assessment & Plan Note (Signed)
Patient with extensive involvement of external ears with crusting and flaking. He has seen ENT for this previously who had started a topical medicine (patient unsure if steroid or antifungal). Patient only applied consistently for a few days.   Plan: --advised patient to f/u with ENT

## 2017-08-23 NOTE — Assessment & Plan Note (Signed)
Patient with multi-week progressive pain of tooth 28. He denies fevers, chills, lymphadenopathy.   On exam tooth #28 is missing (appears to have been an implant); he has mild tenderness of his gumline, but no evidence of deep tissue infection.  Plan: --advised patient to contact insurance to obtain dentist. --advised to rtc if he develops swelling, fever, chills, is unable to tolerate PO

## 2017-08-24 ENCOUNTER — Encounter: Payer: Self-pay | Admitting: *Deleted

## 2017-08-25 NOTE — Progress Notes (Signed)
Internal Medicine Clinic Attending  I saw and evaluated the patient.  I personally confirmed the key portions of the history and exam documented by Dr. Svalina and I reviewed pertinent patient test results.  The assessment, diagnosis, and plan were formulated together and I agree with the documentation in the resident's note.  

## 2017-08-25 NOTE — Addendum Note (Signed)
Addended by: Lalla Brothers T on: 08/25/2017 09:23 AM   Modules accepted: Level of Service

## 2017-09-22 ENCOUNTER — Other Ambulatory Visit: Payer: Self-pay | Admitting: Internal Medicine

## 2017-09-22 DIAGNOSIS — E119 Type 2 diabetes mellitus without complications: Secondary | ICD-10-CM

## 2017-10-31 ENCOUNTER — Encounter (HOSPITAL_COMMUNITY): Payer: Self-pay | Admitting: Emergency Medicine

## 2017-10-31 ENCOUNTER — Emergency Department (HOSPITAL_COMMUNITY): Payer: Medicare HMO

## 2017-10-31 ENCOUNTER — Other Ambulatory Visit: Payer: Self-pay

## 2017-10-31 DIAGNOSIS — Z7984 Long term (current) use of oral hypoglycemic drugs: Secondary | ICD-10-CM | POA: Insufficient documentation

## 2017-10-31 DIAGNOSIS — Z955 Presence of coronary angioplasty implant and graft: Secondary | ICD-10-CM | POA: Insufficient documentation

## 2017-10-31 DIAGNOSIS — I2510 Atherosclerotic heart disease of native coronary artery without angina pectoris: Secondary | ICD-10-CM | POA: Insufficient documentation

## 2017-10-31 DIAGNOSIS — R1013 Epigastric pain: Secondary | ICD-10-CM | POA: Diagnosis not present

## 2017-10-31 DIAGNOSIS — R079 Chest pain, unspecified: Secondary | ICD-10-CM | POA: Diagnosis not present

## 2017-10-31 DIAGNOSIS — I10 Essential (primary) hypertension: Secondary | ICD-10-CM | POA: Diagnosis not present

## 2017-10-31 DIAGNOSIS — R197 Diarrhea, unspecified: Secondary | ICD-10-CM | POA: Insufficient documentation

## 2017-10-31 DIAGNOSIS — F17210 Nicotine dependence, cigarettes, uncomplicated: Secondary | ICD-10-CM | POA: Insufficient documentation

## 2017-10-31 DIAGNOSIS — E119 Type 2 diabetes mellitus without complications: Secondary | ICD-10-CM | POA: Diagnosis not present

## 2017-10-31 DIAGNOSIS — R112 Nausea with vomiting, unspecified: Secondary | ICD-10-CM | POA: Diagnosis not present

## 2017-10-31 DIAGNOSIS — R1110 Vomiting, unspecified: Secondary | ICD-10-CM | POA: Diagnosis not present

## 2017-10-31 DIAGNOSIS — Z79899 Other long term (current) drug therapy: Secondary | ICD-10-CM | POA: Diagnosis not present

## 2017-10-31 LAB — URINALYSIS, ROUTINE W REFLEX MICROSCOPIC
BACTERIA UA: NONE SEEN
BILIRUBIN URINE: NEGATIVE
Glucose, UA: 50 mg/dL — AB
Ketones, ur: 20 mg/dL — AB
LEUKOCYTES UA: NEGATIVE
NITRITE: NEGATIVE
Protein, ur: >=300 mg/dL — AB
Specific Gravity, Urine: 1.039 — ABNORMAL HIGH (ref 1.005–1.030)
pH: 5 (ref 5.0–8.0)

## 2017-10-31 LAB — BASIC METABOLIC PANEL
Anion gap: 13 (ref 5–15)
BUN: 14 mg/dL (ref 6–20)
CHLORIDE: 100 mmol/L — AB (ref 101–111)
CO2: 24 mmol/L (ref 22–32)
CREATININE: 1.17 mg/dL (ref 0.61–1.24)
Calcium: 9.6 mg/dL (ref 8.9–10.3)
GFR calc non Af Amer: >60 mL/min (ref >60)
Glucose, Bld: 160 mg/dL — ABNORMAL HIGH (ref 65–99)
POTASSIUM: 3.6 mmol/L (ref 3.5–5.1)
Sodium: 137 mmol/L (ref 135–145)

## 2017-10-31 LAB — CBC
HCT: 48.2 % (ref 39.0–52.0)
Hemoglobin: 16.7 g/dL (ref 13.0–17.0)
MCH: 28.9 pg (ref 26.0–34.0)
MCHC: 34.6 g/dL (ref 30.0–36.0)
MCV: 83.5 fL (ref 78.0–100.0)
PLATELETS: 153 10*3/uL (ref 150–400)
RBC: 5.77 MIL/uL (ref 4.22–5.81)
RDW: 14.2 % (ref 11.5–15.5)
WBC: 7.7 10*3/uL (ref 4.0–10.5)

## 2017-10-31 LAB — I-STAT TROPONIN, ED: Troponin i, poc: 0 ng/mL (ref 0.00–0.08)

## 2017-10-31 LAB — LIPASE, BLOOD: Lipase: 33 U/L (ref 11–51)

## 2017-10-31 NOTE — ED Triage Notes (Signed)
Patient with abdominal pain, chest pain, nausea and vomiting and diarrhea that has been going on for two days.  Patient states that the pain in the abdomin started first, now he thinks that it is chest pain also.

## 2017-11-01 ENCOUNTER — Emergency Department (HOSPITAL_COMMUNITY): Payer: Medicare HMO

## 2017-11-01 ENCOUNTER — Emergency Department (HOSPITAL_COMMUNITY)
Admission: EM | Admit: 2017-11-01 | Discharge: 2017-11-01 | Disposition: A | Discharge status: Home / Self Care | Payer: Medicare HMO | Attending: Emergency Medicine | Admitting: Emergency Medicine

## 2017-11-01 DIAGNOSIS — R1013 Epigastric pain: Secondary | ICD-10-CM

## 2017-11-01 DIAGNOSIS — R1110 Vomiting, unspecified: Secondary | ICD-10-CM | POA: Diagnosis not present

## 2017-11-01 MED ORDER — MORPHINE SULFATE (PF) 4 MG/ML IV SOLN
4.0000 mg | Freq: Once | INTRAVENOUS | Status: AC
  Administered 2017-11-01: 4 mg via INTRAVENOUS
  Filled 2017-11-01: qty 1

## 2017-11-01 MED ORDER — GI COCKTAIL ~~LOC~~
30.0000 mL | Freq: Once | ORAL | Status: AC
  Administered 2017-11-01: 30 mL via ORAL
  Filled 2017-11-01: qty 30

## 2017-11-01 MED ORDER — OMEPRAZOLE 20 MG PO CPDR: 20.0000 mg | DELAYED_RELEASE_CAPSULE | Freq: Every day | ORAL | 0 refills | Status: DC

## 2017-11-01 MED ORDER — IOPAMIDOL (ISOVUE-300) INJECTION 61%
INTRAVENOUS | Status: AC
  Administered 2017-11-01: 100 mL
  Filled 2017-11-01: qty 100

## 2017-11-01 MED ORDER — ONDANSETRON HCL 4 MG/2ML IJ SOLN
4.0000 mg | Freq: Once | INTRAMUSCULAR | Status: AC
  Administered 2017-11-01: 4 mg via INTRAVENOUS
  Filled 2017-11-01: qty 2

## 2017-11-01 MED ORDER — ONDANSETRON 4 MG PO TBDP: 4.0000 mg | ORAL_TABLET | Freq: Three times a day (TID) | ORAL | 0 refills | Status: DC | PRN

## 2017-11-01 NOTE — ED Notes (Signed)
Pt given saltines and water. 

## 2017-11-01 NOTE — ED Notes (Signed)
Pt taken to CT.

## 2017-11-01 NOTE — ED Provider Notes (Signed)
[facility] EMERGENCY DEPARTMENT Provider Note   CSN: [id number] Arrival date & time: 10/31/17  2044     History   Chief Complaint Chief Complaint  Patient presents with  . Abdominal Pain  . Chest Pain    HPI [name] is a 61 y.o. male.  HPI  This is a 61 year old male with a history of coronary artery disease, hypertension, diabetes who presents with abdominal pain and vomiting.  Patient reports 2-day history of worsening epigastric pain that now radiates into his chest.  He reports that it is sharp and nonradiating.  Currently his pain is 10 out of 10.  He has not taken anything for the pain.  He states that he is vomiting everything that he eats.  He denies any bloody emesis or dark tarry stools.  Denies any alcohol use.  He does use fairly frequent NSAIDs.  Also reports loose stools.  Denies any fevers or urinary symptoms.  Past Medical History:  Diagnosis Date  . Atrial arrhythmia   . CAP (community acquired pneumonia)   . Cellulitis of knee, right 06/06/12  . Chronic otitis externa 06/06/12   C Multiple trials with antibiocs and antifungal. Cultures were positive for Pseudomonas. Follow up with ENT on 06/14/12    . CORONARY ARTERY DISEASE 11/25/2006   Cardiac cath by Dr Terrence Dupont 11/2003 LV showed good LV systolic function, EF of 51-70%. Left main was patent. LAD has 20-30% mid stenosis. Diagonal 1 and diagonal 2 were patent. Left circumflex was patent. OM1 was less than 0.5 mm which was diffusely diseased. OM2 has 20% ostial stenosis which was patent which  was moderate size. OM3 was patent at prior PTCA and stented site. RCA has 20-30% proximal   . Hypertension   . NSTEMI (non-ST elevated myocardial infarction) ([place]) 08/14/2012   S/p Successful PTCA to proximal OM-3     . TOBACCO ABUSE 11/25/2006  . Type II diabetes mellitus [facility])     Patient Active Problem List   Diagnosis Date Noted  . Seborrheic dermatitis 08/23/2017  . Peripheral neuropathy  08/22/2017  . Tooth pain 08/22/2017  . Anemia 06/21/2017  . Abdominal discomfort 04/27/2017  . Need for vaccination with 13-polyvalent pneumococcal conjugate vaccine 12/03/2016  . Hematuria, gross 09/10/2016  . Oral candidiasis 09/10/2016  . Diarrhea 09/10/2016  . Sensation of foreign body in eye 01/05/2016  . Chronic anticoagulation (Xarelto) 12/26/2015  . Right lumbar radiculitis 11/19/2014  . Insomnia 05/22/2014  . Healthcare maintenance 12/13/2013  . Greater trochanteric bursitis of left hip 05/16/2013  . Dysphagia, unspecified(787.20) 05/10/2013  . Lumbosacral spondylosis without myelopathy 04/18/2013  . Lumbar facet arthropathy 04/18/2013  . Sacroiliac joint dysfunction of left side 02/21/2013  . Leg length discrepancy 02/21/2013  . Seborrhea capitis 01/23/2013  . NSTEMI (non-ST elevated myocardial infarction) ([place]) 08/14/2012  . Tobacco abuse 08/08/2012  . Hypertension 09/25/2011  . GERD 04/08/2010  . Chronic radicular low back pain 07/20/2007  . Diabetes mellitus type 2, controlled ([place]) 11/25/2006  . HYPOGONADISM 11/25/2006  . Coronary atherosclerosis 11/25/2006  . Atrial fibrillation ([place]) 11/25/2006    Past Surgical History:  Procedure Laterality Date  . CORONARY ANGIOPLASTY WITH STENT PLACEMENT  ~ 2005   "1"  . CYSTECTOMY  1990's   LUA  . LEFT HEART CATHETERIZATION WITH CORONARY ANGIOGRAM N/A 08/15/2012   Procedure: LEFT HEART CATHETERIZATION WITH CORONARY ANGIOGRAM;  Surgeon: Clent Demark, MD;  Location: [facility] CATH LAB;  Service: Cardiovascular;  Laterality: N/A;  . PERCUTANEOUS CORONARY  STENT INTERVENTION (PCI-S)  08/15/2012   Procedure: PERCUTANEOUS CORONARY STENT INTERVENTION (PCI-S);  Surgeon: Clent Demark, MD;  Location: [facility] CATH LAB;  Service: Cardiovascular;;       Home Medications    Prior to Admission medications   Medication Sig Start Date End Date Taking? Authorizing Provider  acetaminophen (TYLENOL) 500 MG tablet Take 1 tablet (500 mg total)  by mouth every 8 (eight) hours as needed for fever. Patient taking differently: Take 500-1,000 mg by mouth every 8 (eight) hours as needed for moderate pain or fever.  11/01/13  Yes Otho Bellows, MD  gabapentin (NEURONTIN) 300 MG capsule Take 2 capsules (600 mg total) by mouth 3 (three) times daily. 11/30/16  Yes Hoffman, Jessica Ratliff, DO  glipiZIDE (GLUCOTROL) 10 MG tablet TAKE 1 TABLET BY MOUTH ONCE DAILY 09/22/17  Yes Hoffman, Jessica Ratliff, DO  isosorbide mononitrate (IMDUR) 30 MG 24 hr tablet Take 1 tablet (30 mg total) by mouth daily. 06/23/17  Yes Hoffman, Jessica Ratliff, DO  metFORMIN (GLUCOPHAGE) 1000 MG tablet TAKE ONE TABLET BY MOUTH TWICE DAILY WITH MEALS 04/12/17  Yes Hoffman, Jessica Ratliff, DO  nitroGLYCERIN (NITROSTAT) 0.4 MG SL tablet DISSOLVE ONE TABLET UNDER THE TONGUE EVERY 5 MINUTES AS NEEDED FOR CHEST PAIN.  DO NOT EXCEED A TOTAL OF 3 DOSES IN 15 MINUTES 02/15/17  Yes Hoffman, Jessica Ratliff, DO  pravastatin (PRAVACHOL) 40 MG tablet TAKE ONE TABLET BY MOUTH ONCE DAILY 02/15/17  Yes Hoffman, Jessica Ratliff, DO  XARELTO 20 MG TABS tablet TAKE ONE TABLET BY MOUTH ONCE DAILY 07/29/17  Yes Hoffman, Jessica Ratliff, DO  omeprazole (PRILOSEC) 20 MG capsule Take 1 capsule (20 mg total) by mouth daily. 11/01/17   Lekeith Wulf, Barbette Hair, MD  ondansetron (ZOFRAN ODT) 4 MG disintegrating tablet Take 1 tablet (4 mg total) by mouth every 8 (eight) hours as needed for nausea or vomiting. 11/01/17   De Jaworski, Barbette Hair, MD  ondansetron (ZOFRAN) 4 MG tablet Take 1 tablet (4 mg total) by mouth every 8 (eight) hours as needed for nausea or vomiting. Patient not taking: Reported on 11/01/2017 05/16/17 05/16/18  Shela Leff, MD  promethazine (PHENERGAN) 12.5 MG tablet Take 1 tablet (12.5 mg total) by mouth every 6 (six) hours as needed for nausea or vomiting. Patient not taking: Reported on 11/01/2017 06/20/17   Kalman Shan Ratliff, DO  sotalol (BETAPACE) 80 MG tablet TAKE ONE-HALF TABLET BY  MOUTH TWICE DAILY Patient not taking: Reported on 11/01/2017 07/29/17   Kalman Shan Ratliff, DO  triamcinolone lotion (KENALOG) 0.1 % Apply topically 2 (two) times daily. Patient not taking: Reported on 11/01/2017 09/26/15   Rivet, Sindy Guadeloupe, MD  VOLTAREN 1 % GEL Apply 2 g topically 4 (four) times daily. Patient not taking: Reported on 03/02/2017 05/04/16   Iline Oven, MD    Family History Family History  Problem Relation Age of Onset  . Cancer Mother     Social History Social History   Tobacco Use  . Smoking status: Current Some Day Smoker    Packs/day: 0.20    Years: 12.00    Pack years: 2.40    Types: Cigarettes    Last attempt to quit: 11/29/2014    Years since quitting: 2.9  . Smokeless tobacco: Never Used  Substance Use Topics  . Alcohol use: Yes    Alcohol/week: 0.0 oz    Comment: socially  . Drug use: No    Comment: former marijuana     Allergies   Clindamycin/lincomycin  Review of Systems Review of Systems  Constitutional: Negative for fever.  Respiratory: Negative for shortness of breath.   Cardiovascular: Positive for chest pain.  Gastrointestinal: Positive for abdominal pain, diarrhea, nausea and vomiting.  Genitourinary: Negative for dysuria.  All other systems reviewed and are negative.    Physical Exam Updated Vital Signs BP (!) 148/77 (BP Location: Left Arm)   Pulse (!) 16   Temp 98.1 F (36.7 C) (Oral)   Resp 16   SpO2 96%   Physical Exam  Constitutional: He is oriented to person, place, and time.  Uncomfortable appearing but nontoxic  HENT:  Head: Normocephalic and atraumatic.  Neck: Neck supple.  Cardiovascular: Normal rate, regular rhythm and normal heart sounds.  No murmur heard. Pulmonary/Chest: Effort normal and breath sounds normal. No respiratory distress. He has no wheezes.  Abdominal: Soft. Normal appearance and bowel sounds are normal. He exhibits no ascites. There is tenderness. There is no rebound.  Epigastric  tenderness palpation without rebound or guarding  Musculoskeletal: He exhibits no edema.  Lymphadenopathy:    He has no cervical adenopathy.  Neurological: He is alert and oriented to person, place, and time.  Skin: Skin is warm and dry.  Psychiatric: He has a normal mood and affect.  Nursing note and vitals reviewed.    ED Treatments / Results  Labs (all labs ordered are listed, but only abnormal results are displayed) Labs Reviewed  BASIC METABOLIC PANEL - Abnormal; Notable for the following components:      Result Value   Chloride 100 (*)    Glucose, Bld 160 (*)    All other components within normal limits  URINALYSIS, ROUTINE W REFLEX MICROSCOPIC - Abnormal; Notable for the following components:   Color, Urine AMBER (*)    APPearance HAZY (*)    Specific Gravity, Urine 1.039 (*)    Glucose, UA 50 (*)    Hgb urine dipstick MODERATE (*)    Ketones, ur 20 (*)    Protein, ur >=300 (*)    Squamous Epithelial / LPF 0-5 (*)    All other components within normal limits  CBC  LIPASE, BLOOD  I-STAT TROPONIN, ED    EKG  EKG Interpretation  Date/Time:  Monday October 31 2017 21:32:12 EST Ventricular Rate:  63 PR Interval:  160 QRS Duration: 100 QT Interval:  402 QTC Calculation: 411 R Axis:   74 Text Interpretation:  Normal sinus rhythm T wave abnormality, consider inferior ischemia Abnormal ECG No significant change since last tracing Confirmed by Addison Lank [id number]) on 10/31/2017 9:36:39 PM       Radiology Dg Chest 2 View  Result Date: 10/31/2017 CLINICAL DATA:  Chest and abdominal pain, nausea and vomiting for 2 days EXAM: CHEST  2 VIEW COMPARISON:  CXR 08/25/2016 FINDINGS: The heart size and mediastinal contours are within normal limits. Both lungs are clear. The visualized skeletal structures are unremarkable. IMPRESSION: No active cardiopulmonary disease. Electronically Signed   By: Ashley Royalty M.D.   On: 10/31/2017 22:48   Ct Abdomen Pelvis W  Contrast  Result Date: 11/01/2017 CLINICAL DATA:  Nausea vomiting for 3 days. EXAM: CT ABDOMEN AND PELVIS WITH CONTRAST TECHNIQUE: Multidetector CT imaging of the abdomen and pelvis was performed using the standard protocol following bolus administration of intravenous contrast. CONTRAST:  178mL ISOVUE-300 IOPAMIDOL (ISOVUE-300) INJECTION 61% COMPARISON:  10/20/2016 FINDINGS: Lower chest: No acute abnormality. Hepatobiliary: No focal liver abnormality is seen. No gallstones, gallbladder wall thickening, or biliary dilatation. Pancreas: Unremarkable. No  pancreatic ductal dilatation or surrounding inflammatory changes. Spleen: Stable hyper attenuated lesions in the spleen, likely benign. Adrenals/Urinary Tract: Normal bilateral adrenal glands. Normal right kidney. 1.5 cm solid-appearing mass in the medial cortex of the left kidney. Stomach/Bowel: Gas bubbles counter dependently within the gastric antrum may be an incidental finding, however they may represent a gastric ulcer. Appendix appears normal. No evidence of bowel wall thickening, distention, or inflammatory changes. Vascular/Lymphatic: Aortic atherosclerosis. No enlarged abdominal or pelvic lymph nodes. Reproductive: Prostate is unremarkable. Other: No abdominal wall hernia or abnormality. No abdominopelvic ascites. Musculoskeletal: Posterior facet arthropathy of the lower lumbosacral spine. IMPRESSION: Gas bubbles counter dependently within the gastric antrum may be an incidental finding, however they may represent a gastric ulcer. 1.5 cm solid-appearing mass in the medial inferior cortex of the left kidney. Further evaluation with dedicated nonemergent MRI or CT, renal protocol, of the abdomen is recommended. Calcific atherosclerotic disease of the aorta. Electronically Signed   By: Fidela Salisbury M.D.   On: 11/01/2017 04:25    Procedures Procedures (including critical care time)  Medications Ordered in ED Medications  morphine 4 MG/ML  injection 4 mg (4 mg Intravenous Given 11/01/17 0244)  ondansetron (ZOFRAN) injection 4 mg (4 mg Intravenous Given 11/01/17 0244)  iopamidol (ISOVUE-300) 61 % injection (100 mLs  Contrast Given 11/01/17 0254)  gi cocktail (Maalox,Lidocaine,Donnatal) (30 mLs Oral Given 11/01/17 0436)     Initial Impression / Assessment and Plan / ED Course  I have reviewed the triage vital signs and the nursing notes.  Pertinent labs & imaging results that were available during my care of the patient were reviewed by me and considered in my medical decision making (see chart for details).     Patient presents with epigastric discomfort and vomiting.  He several well-appearing.  Vital signs reassuring.  Tenderness on exam without signs of peritonitis.  Lab work is largely reassuring including lipase and LFTs.  He does report radiation of pain into the chest.  Suspicious for gastritis versus peptic ulcer.  CT obtained for further evaluation and to rule out intra-abdominal pathology or aortic abnormality.  CT shows gastric bubbles which may indicate peptic ulcer.  Patient given a GI cocktail.  He is now tolerating fluids.  Recommend discontinuing NSAID use.  Follow-up with GI.  Will start on omeprazole.  After history, exam, and medical workup I feel the patient has been appropriately medically screened and is safe for discharge home. Pertinent diagnoses were discussed with the patient. Patient was given return precautions.   Final Clinical Impressions(s) / ED Diagnoses   Final diagnoses:  Epigastric pain    ED Discharge Orders        Ordered    omeprazole (PRILOSEC) 20 MG capsule  Daily     11/01/17 0458    ondansetron (ZOFRAN ODT) 4 MG disintegrating tablet  Every 8 hours PRN     11/01/17 0458       Merryl Hacker, MD 11/01/17 [phone]

## 2017-11-01 NOTE — Discharge Instructions (Signed)
Seen today for abdominal pain and vomiting.  This may be related to gastritis or peptic ulcer.  Start omeprazole daily.  Avoid alcohol, anti-inflammatory medications.  Follow-up with gastroenterology.  If you develop bloody emesis or dark tarry stools or worsening pain you should be reevaluated immediately.

## 2017-11-01 NOTE — ED Notes (Signed)
Pt reports vomiting water while using the restroom. Verbal orders for zofran obtained

## 2017-11-01 NOTE — ED Notes (Signed)
Patient transported to CT scan . 

## 2017-11-17 ENCOUNTER — Other Ambulatory Visit: Payer: Self-pay | Admitting: *Deleted

## 2017-11-17 DIAGNOSIS — I4891 Unspecified atrial fibrillation: Secondary | ICD-10-CM

## 2017-11-17 MED ORDER — RIVAROXABAN 20 MG PO TABS: 20.0000 mg | ORAL_TABLET | Freq: Every day | ORAL | 1 refills | Status: DC

## 2017-12-02 DIAGNOSIS — I480 Paroxysmal atrial fibrillation: Secondary | ICD-10-CM | POA: Diagnosis not present

## 2017-12-02 DIAGNOSIS — I2510 Atherosclerotic heart disease of native coronary artery without angina pectoris: Secondary | ICD-10-CM | POA: Diagnosis not present

## 2017-12-02 DIAGNOSIS — E119 Type 2 diabetes mellitus without complications: Secondary | ICD-10-CM | POA: Diagnosis not present

## 2017-12-02 DIAGNOSIS — I10 Essential (primary) hypertension: Secondary | ICD-10-CM | POA: Diagnosis not present

## 2017-12-02 DIAGNOSIS — E785 Hyperlipidemia, unspecified: Secondary | ICD-10-CM | POA: Diagnosis not present

## 2017-12-02 DIAGNOSIS — M1990 Unspecified osteoarthritis, unspecified site: Secondary | ICD-10-CM | POA: Diagnosis not present

## 2017-12-09 DIAGNOSIS — G894 Chronic pain syndrome: Secondary | ICD-10-CM | POA: Diagnosis not present

## 2017-12-09 DIAGNOSIS — Z79891 Long term (current) use of opiate analgesic: Secondary | ICD-10-CM | POA: Diagnosis not present

## 2017-12-16 ENCOUNTER — Other Ambulatory Visit: Payer: Self-pay | Admitting: *Deleted

## 2017-12-16 DIAGNOSIS — E785 Hyperlipidemia, unspecified: Secondary | ICD-10-CM

## 2017-12-16 MED ORDER — PRAVASTATIN SODIUM 40 MG PO TABS: 40.0000 mg | ORAL_TABLET | Freq: Every day | ORAL | 3 refills | Status: DC

## 2017-12-30 ENCOUNTER — Other Ambulatory Visit: Payer: Self-pay | Admitting: *Deleted

## 2017-12-30 MED ORDER — NITROGLYCERIN 0.4 MG SL SUBL: SUBLINGUAL_TABLET | SUBLINGUAL | 0 refills | Status: DC

## 2017-12-30 NOTE — Telephone Encounter (Signed)
Next appt scheduled  01/19/18 with PCP. 

## 2018-01-16 NOTE — Progress Notes (Signed)
   CC: Follow up on type II diabetes mellitus   HPI:  [name] is a 62 y.o. male with history noted below that presents to the Internal Medicine Clinic for follow up on type II diabetes mellitus.  Please see problem based charting for the status of patient's chronic medical conditions.   Past Medical History:  Diagnosis Date  . Atrial arrhythmia   . CAP (community acquired pneumonia)   . Cellulitis of knee, right 06/06/12  . Chronic otitis externa 06/06/12   C Multiple trials with antibiocs and antifungal. Cultures were positive for Pseudomonas. Follow up with ENT on 06/14/12    . CORONARY ARTERY DISEASE 11/25/2006   Cardiac cath by Dr Terrence Dupont 11/2003 LV showed good LV systolic function, EF of 22-97%. Left main was patent. LAD has 20-30% mid stenosis. Diagonal 1 and diagonal 2 were patent. Left circumflex was patent. OM1 was less than 0.5 mm which was diffusely diseased. OM2 has 20% ostial stenosis which was patent which  was moderate size. OM3 was patent at prior PTCA and stented site. RCA has 20-30% proximal   . Hypertension   . NSTEMI (non-ST elevated myocardial infarction) ([place]) 08/14/2012   S/p Successful PTCA to proximal OM-3     . TOBACCO ABUSE 11/25/2006  . Type II diabetes mellitus ([place])     Review of Systems: Review of Systems  Constitutional: Negative for malaise/fatigue.  Respiratory: Negative for shortness of breath.   Cardiovascular: Negative for chest pain and palpitations.  Gastrointestinal: Negative for abdominal pain, nausea and vomiting.  Genitourinary: Positive for hematuria. Negative for dysuria, flank pain, frequency and urgency.     Physical Exam:  Vitals:   01/19/18 1421  BP: 115/66  Pulse: 65  Temp: 97.7 F (36.5 C)  TempSrc: Oral  SpO2: 97%  Weight: 257 lb 14.4 oz (117 kg)  Height: 6\' 3"  (1.905 m)   Physical Exam  Constitutional: He is well-developed, well-nourished, and in no distress.  Cardiovascular: Normal rate, regular rhythm and normal  heart sounds. Exam reveals no gallop and no friction rub.  No murmur heard. Pulmonary/Chest: Effort normal and breath sounds normal. No respiratory distress. He has no wheezes. He has no rales.  Musculoskeletal: He exhibits no edema.  Skin: Skin is warm and dry.    Assessment & Plan:   See encounters tab for problem based medical decision making.    Patient discussed with Dr. Dareen Piano

## 2018-01-19 ENCOUNTER — Ambulatory Visit (INDEPENDENT_AMBULATORY_CARE_PROVIDER_SITE_OTHER): Payer: Medicare HMO | Admitting: Internal Medicine

## 2018-01-19 ENCOUNTER — Encounter: Payer: Self-pay | Admitting: Internal Medicine

## 2018-01-19 VITALS — BP 115/66 | HR 65 | Temp 97.7°F | Ht 75.0 in | Wt 257.9 lb

## 2018-01-19 DIAGNOSIS — M47817 Spondylosis without myelopathy or radiculopathy, lumbosacral region: Secondary | ICD-10-CM

## 2018-01-19 DIAGNOSIS — Z79899 Other long term (current) drug therapy: Secondary | ICD-10-CM | POA: Diagnosis not present

## 2018-01-19 DIAGNOSIS — R109 Unspecified abdominal pain: Secondary | ICD-10-CM

## 2018-01-19 DIAGNOSIS — R310 Gross hematuria: Secondary | ICD-10-CM | POA: Diagnosis not present

## 2018-01-19 DIAGNOSIS — I252 Old myocardial infarction: Secondary | ICD-10-CM

## 2018-01-19 DIAGNOSIS — I4891 Unspecified atrial fibrillation: Secondary | ICD-10-CM

## 2018-01-19 DIAGNOSIS — N2889 Other specified disorders of kidney and ureter: Secondary | ICD-10-CM | POA: Diagnosis not present

## 2018-01-19 DIAGNOSIS — Z7901 Long term (current) use of anticoagulants: Secondary | ICD-10-CM | POA: Diagnosis not present

## 2018-01-19 DIAGNOSIS — Z0000 Encounter for general adult medical examination without abnormal findings: Secondary | ICD-10-CM

## 2018-01-19 DIAGNOSIS — Z7984 Long term (current) use of oral hypoglycemic drugs: Secondary | ICD-10-CM

## 2018-01-19 DIAGNOSIS — E119 Type 2 diabetes mellitus without complications: Secondary | ICD-10-CM

## 2018-01-19 DIAGNOSIS — E1121 Type 2 diabetes mellitus with diabetic nephropathy: Secondary | ICD-10-CM

## 2018-01-19 LAB — GLUCOSE, CAPILLARY: Glucose-Capillary: 99 mg/dL (ref 65–99)

## 2018-01-19 LAB — POCT GLYCOSYLATED HEMOGLOBIN (HGB A1C): Hemoglobin A1C: 6.5

## 2018-01-19 MED ORDER — ISOSORBIDE MONONITRATE ER 30 MG PO TB24: 30.0000 mg | ORAL_TABLET | Freq: Every day | ORAL | 3 refills | Status: DC

## 2018-01-19 MED ORDER — OMEPRAZOLE 20 MG PO CPDR: 20.0000 mg | DELAYED_RELEASE_CAPSULE | Freq: Every day | ORAL | 11 refills | Status: DC

## 2018-01-19 MED ORDER — METFORMIN HCL 1000 MG PO TABS: 1000.0000 mg | ORAL_TABLET | Freq: Two times a day (BID) | ORAL | 3 refills | Status: DC

## 2018-01-19 MED ORDER — GABAPENTIN 300 MG PO CAPS: 600.0000 mg | ORAL_CAPSULE | Freq: Three times a day (TID) | ORAL | 3 refills | Status: DC

## 2018-01-19 MED ORDER — ONDANSETRON HCL 4 MG PO TABS: 4.0000 mg | ORAL_TABLET | Freq: Three times a day (TID) | ORAL | 0 refills | Status: DC | PRN

## 2018-01-19 MED ORDER — SOTALOL HCL 80 MG PO TABS: 40.0000 mg | ORAL_TABLET | Freq: Two times a day (BID) | ORAL | 2 refills | Status: DC

## 2018-01-19 NOTE — Patient Instructions (Signed)
Mr. Atteberry,  It was a pleasure seeing you today.  Please follow up in 3 months.  In the meantime please follow up with your urologist.  Please get the MRI before seeing your urologist.

## 2018-01-20 LAB — CBC
Hematocrit: 43.2 % (ref 37.5–51.0)
Hemoglobin: 14.4 g/dL (ref 13.0–17.7)
MCH: 28.3 pg (ref 26.6–33.0)
MCHC: 33.3 g/dL (ref 31.5–35.7)
MCV: 85 fL (ref 79–97)
PLATELETS: 167 10*3/uL (ref 150–379)
RBC: 5.09 x10E6/uL (ref 4.14–5.80)
RDW: 15.2 % (ref 12.3–15.4)
WBC: 6.8 10*3/uL (ref 3.4–10.8)

## 2018-01-22 MED ORDER — ROSUVASTATIN CALCIUM 40 MG PO TABS: 40.0000 mg | ORAL_TABLET | Freq: Every day | ORAL | 3 refills | Status: DC

## 2018-01-22 NOTE — Assessment & Plan Note (Signed)
Assessment:  Chronic lumbar pain Patient follows with a pain clinic and would like a referral to one in [place] since his insurance switched and is no longer accepted at the current location  Plan -referral to pain clinic in [place]

## 2018-01-22 NOTE — Assessment & Plan Note (Addendum)
Assessment:  History of abdominal pain Patient was evaluated in the ED for abdominal pain on 10/2017.  Patient had a CT abdomen/pelvis w/contrast that showed possible gastric ulcer.  Patient was started on omeprazole and recommended to follow up with GI. Patient has not had a chance to follow up with his GI doctor since ED visit.  Today he states he does not have abdominal pain and thinks omeprazole is helping. On imaging there was also an incidental finding of a 1.5 cm solid-appearing mass in the medial inferior cortex of the left kidney. Further evaluation with dedicated nonemergent MRI or CT, renal protocol, of the abdomen was recommended.  Plan -omeprazole -CT renal protocol

## 2018-01-22 NOTE — Assessment & Plan Note (Signed)
Assessment:  Healthcare maintenance  Currently on pravastatin 40mg  daily.  History of NSTEMI would need high intensity statin for secondary prevention.  Plan -discontinue pravastatin and start crestor

## 2018-01-22 NOTE — Assessment & Plan Note (Signed)
Assessment;  Type II diabetes mellitus Patient currently takes metformin 1000mg  BID and glipizid 10mg  daily.  Hemoglobin A1C today is 6.5  Plan -continue current medications

## 2018-01-22 NOTE — Assessment & Plan Note (Addendum)
Assessment: Gross hematuria Patient has a history of gross hematuria and has seen urology in the past.  He is on xarelto for afib and this was considered the culprit for his gross hematuria.  With new incidental finding of kidney mass will obtian renal CT and refer back to urology.    Plan -urology referral -CT renal protocol

## 2018-01-23 ENCOUNTER — Telehealth: Payer: Self-pay | Admitting: *Deleted

## 2018-01-23 NOTE — Progress Notes (Signed)
Internal Medicine Clinic Attending  Case discussed with Dr. Hoffman at the time of the visit.  We reviewed the resident's history and exam and pertinent patient test results.  I agree with the assessment, diagnosis, and plan of care documented in the resident's note.  

## 2018-01-23 NOTE — Telephone Encounter (Signed)
CALLED AND LEFT VOICE MESSAGE FOR PATIENT TO CONTACT OFFICE WITH PHONE NUMBER OF THE CLINIC IN [place]/PAIN CLINIC HE IS WANTING TO BE REFERRED TO.

## 2018-01-24 LAB — MICROSCOPIC EXAMINATION
Casts: NONE SEEN /lpf
RBC, UA: >30 /hpf — AB (ref 0–?)

## 2018-01-24 LAB — URINALYSIS, ROUTINE W REFLEX MICROSCOPIC
Bilirubin, UA: NEGATIVE
GLUCOSE, UA: NEGATIVE
LEUKOCYTES UA: NEGATIVE
Nitrite, UA: NEGATIVE
Specific Gravity, UA: >=1.03 — AB (ref 1.005–1.030)
Urobilinogen, Ur: 1 mg/dL (ref 0.2–1.0)
pH, UA: 6.5 (ref 5.0–7.5)

## 2018-02-01 ENCOUNTER — Other Ambulatory Visit: Payer: Self-pay | Admitting: Urology

## 2018-02-01 DIAGNOSIS — R310 Gross hematuria: Secondary | ICD-10-CM | POA: Diagnosis not present

## 2018-02-01 DIAGNOSIS — R350 Frequency of micturition: Secondary | ICD-10-CM | POA: Diagnosis not present

## 2018-02-01 DIAGNOSIS — N281 Cyst of kidney, acquired: Secondary | ICD-10-CM

## 2018-02-11 ENCOUNTER — Ambulatory Visit
Admission: RE | Admit: 2018-02-11 | Discharge: 2018-02-11 | Disposition: A | Discharge status: Home / Self Care | Payer: Medicare HMO | Source: Ambulatory Visit | Attending: Urology | Admitting: Urology

## 2018-02-11 DIAGNOSIS — N281 Cyst of kidney, acquired: Secondary | ICD-10-CM

## 2018-02-11 DIAGNOSIS — N2889 Other specified disorders of kidney and ureter: Secondary | ICD-10-CM | POA: Diagnosis not present

## 2018-02-11 MED ORDER — GADOBENATE DIMEGLUMINE 529 MG/ML IV SOLN
20.0000 mL | Freq: Once | INTRAVENOUS | Status: AC | PRN
  Administered 2018-02-11: 20 mL via INTRAVENOUS

## 2018-02-22 DIAGNOSIS — M47816 Spondylosis without myelopathy or radiculopathy, lumbar region: Secondary | ICD-10-CM | POA: Diagnosis not present

## 2018-02-22 DIAGNOSIS — G894 Chronic pain syndrome: Secondary | ICD-10-CM | POA: Diagnosis not present

## 2018-02-22 DIAGNOSIS — M5136 Other intervertebral disc degeneration, lumbar region: Secondary | ICD-10-CM | POA: Diagnosis not present

## 2018-02-22 DIAGNOSIS — Z79891 Long term (current) use of opiate analgesic: Secondary | ICD-10-CM | POA: Diagnosis not present

## 2018-02-23 DIAGNOSIS — N281 Cyst of kidney, acquired: Secondary | ICD-10-CM | POA: Diagnosis not present

## 2018-02-23 DIAGNOSIS — R310 Gross hematuria: Secondary | ICD-10-CM | POA: Diagnosis not present

## 2018-03-03 ENCOUNTER — Ambulatory Visit (HOSPITAL_COMMUNITY): Payer: Medicare HMO

## 2018-03-10 ENCOUNTER — Ambulatory Visit (HOSPITAL_COMMUNITY): Admission: RE | Admit: 2018-03-10 | Payer: Medicare HMO | Source: Ambulatory Visit

## 2018-03-10 DIAGNOSIS — E785 Hyperlipidemia, unspecified: Secondary | ICD-10-CM | POA: Diagnosis not present

## 2018-03-10 DIAGNOSIS — E119 Type 2 diabetes mellitus without complications: Secondary | ICD-10-CM | POA: Diagnosis not present

## 2018-03-10 DIAGNOSIS — I480 Paroxysmal atrial fibrillation: Secondary | ICD-10-CM | POA: Diagnosis not present

## 2018-03-10 DIAGNOSIS — M1990 Unspecified osteoarthritis, unspecified site: Secondary | ICD-10-CM | POA: Diagnosis not present

## 2018-03-10 DIAGNOSIS — I2510 Atherosclerotic heart disease of native coronary artery without angina pectoris: Secondary | ICD-10-CM | POA: Diagnosis not present

## 2018-03-10 DIAGNOSIS — I10 Essential (primary) hypertension: Secondary | ICD-10-CM | POA: Diagnosis not present

## 2018-03-13 DIAGNOSIS — Z79891 Long term (current) use of opiate analgesic: Secondary | ICD-10-CM | POA: Diagnosis not present

## 2018-03-13 DIAGNOSIS — G894 Chronic pain syndrome: Secondary | ICD-10-CM | POA: Diagnosis not present

## 2018-03-13 DIAGNOSIS — M47816 Spondylosis without myelopathy or radiculopathy, lumbar region: Secondary | ICD-10-CM | POA: Diagnosis not present

## 2018-03-13 DIAGNOSIS — M5136 Other intervertebral disc degeneration, lumbar region: Secondary | ICD-10-CM | POA: Diagnosis not present

## 2018-03-15 ENCOUNTER — Telehealth: Payer: Self-pay

## 2018-03-15 NOTE — Telephone Encounter (Signed)
I called the patient about his Xarelto.  Pt verbalizes correct instructions for taking his Xarelto (1 tab once a day). He endorses occasional bloody nose and blood in his urine, but stated "my doctor knows about those". Problems have not worsened.   He is currently out of his Xarelto and gabapentin. I will send a message to his PCP for refills.   Evangeline Gula, PharmD Candidate

## 2018-03-15 NOTE — Telephone Encounter (Signed)
I called patient for medication management. He states that he is out of his Xarelto and gabapentin and requests refills. Can you please send that into his pharmacy?   He also said that he currently takes methocarbamol but most recent rx on his chart was in 2016. He would like refills on those. Can you check that, as well?  Thank you!

## 2018-03-16 ENCOUNTER — Other Ambulatory Visit: Payer: Self-pay | Admitting: Pharmacist

## 2018-03-16 DIAGNOSIS — I4891 Unspecified atrial fibrillation: Secondary | ICD-10-CM

## 2018-03-16 NOTE — Telephone Encounter (Signed)
No problem, Arden, thank you!

## 2018-03-19 ENCOUNTER — Other Ambulatory Visit: Payer: Self-pay | Admitting: Internal Medicine

## 2018-03-19 DIAGNOSIS — I4891 Unspecified atrial fibrillation: Secondary | ICD-10-CM

## 2018-03-19 MED ORDER — RIVAROXABAN 20 MG PO TABS: 20.0000 mg | ORAL_TABLET | Freq: Every day | ORAL | 1 refills | Status: DC

## 2018-03-20 ENCOUNTER — Ambulatory Visit (HOSPITAL_COMMUNITY)
Admission: RE | Admit: 2018-03-20 | Discharge: 2018-03-20 | Disposition: A | Discharge status: Home / Self Care | Payer: Medicare HMO | Source: Ambulatory Visit | Attending: Internal Medicine | Admitting: Internal Medicine

## 2018-05-17 DIAGNOSIS — M5136 Other intervertebral disc degeneration, lumbar region: Secondary | ICD-10-CM | POA: Diagnosis not present

## 2018-05-17 DIAGNOSIS — M791 Myalgia, unspecified site: Secondary | ICD-10-CM | POA: Diagnosis not present

## 2018-05-17 DIAGNOSIS — Z79891 Long term (current) use of opiate analgesic: Secondary | ICD-10-CM | POA: Diagnosis not present

## 2018-05-17 DIAGNOSIS — G894 Chronic pain syndrome: Secondary | ICD-10-CM | POA: Diagnosis not present

## 2018-05-17 DIAGNOSIS — M47816 Spondylosis without myelopathy or radiculopathy, lumbar region: Secondary | ICD-10-CM | POA: Diagnosis not present

## 2018-06-09 DIAGNOSIS — M1990 Unspecified osteoarthritis, unspecified site: Secondary | ICD-10-CM | POA: Diagnosis not present

## 2018-06-09 DIAGNOSIS — I480 Paroxysmal atrial fibrillation: Secondary | ICD-10-CM | POA: Diagnosis not present

## 2018-06-09 DIAGNOSIS — I10 Essential (primary) hypertension: Secondary | ICD-10-CM | POA: Diagnosis not present

## 2018-06-09 DIAGNOSIS — I2510 Atherosclerotic heart disease of native coronary artery without angina pectoris: Secondary | ICD-10-CM | POA: Diagnosis not present

## 2018-06-09 DIAGNOSIS — E119 Type 2 diabetes mellitus without complications: Secondary | ICD-10-CM | POA: Diagnosis not present

## 2018-06-09 DIAGNOSIS — R319 Hematuria, unspecified: Secondary | ICD-10-CM | POA: Diagnosis not present

## 2018-06-09 DIAGNOSIS — E785 Hyperlipidemia, unspecified: Secondary | ICD-10-CM | POA: Diagnosis not present

## 2018-07-24 ENCOUNTER — Other Ambulatory Visit: Payer: Self-pay | Admitting: Internal Medicine

## 2018-07-24 DIAGNOSIS — M47816 Spondylosis without myelopathy or radiculopathy, lumbar region: Secondary | ICD-10-CM | POA: Diagnosis not present

## 2018-07-24 DIAGNOSIS — G894 Chronic pain syndrome: Secondary | ICD-10-CM | POA: Diagnosis not present

## 2018-07-24 DIAGNOSIS — M5136 Other intervertebral disc degeneration, lumbar region: Secondary | ICD-10-CM | POA: Diagnosis not present

## 2018-07-24 DIAGNOSIS — Z79891 Long term (current) use of opiate analgesic: Secondary | ICD-10-CM | POA: Diagnosis not present

## 2018-08-05 IMAGING — MR MR ABDOMEN WO/W CM
17 series · 48 of 48 positions shown · IV contrast (20 ml multihance)
Comparison: Abdominal CT scan 10/20/2016 and 11/01/2017.

CLINICAL DATA: Evaluate left renal mass seen on CT scan.

EXAM:
MRI ABDOMEN WITHOUT AND WITH CONTRAST
TECHNIQUE: Multiplanar multisequence MR imaging of the abdomen was performed
both before and after the administration of intravenous contrast.
CONTRAST:  20mL MULTIHANCE GADOBENATE DIMEGLUMINE 529 MG/ML IV SOLN

[Series 4: T2 · coronal · 5.0mm · 1.56mm/px · 1 of 36 slices shown (1 of 3)]
[im 1/36]
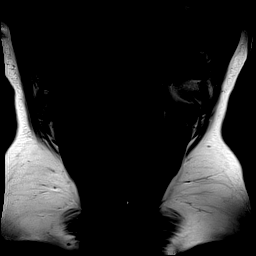

[Series 5: T1 · axial · 3.0mm · 1.19mm/px · z∈[-119,+94]mm · 5 of 144 slices shown]
[im 1/144]
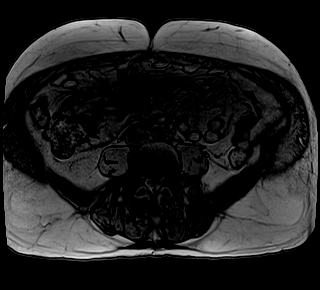
[im 36/144]
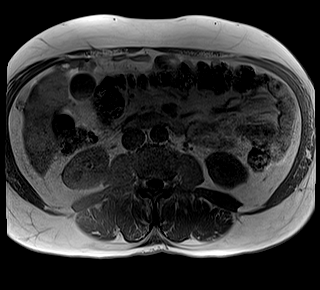
[im 72/144]
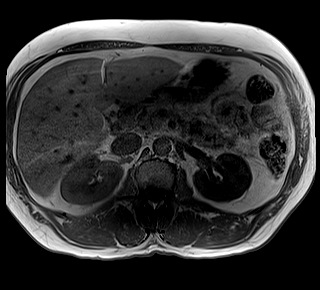
[im 108/144]
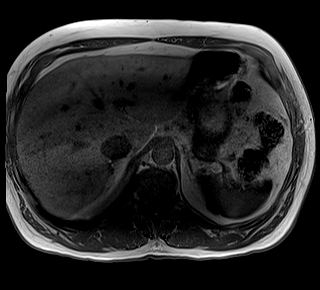
[im 144/144]
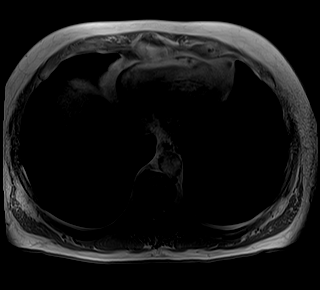

[Series 6: T2 · axial · 5.0mm · 1.48mm/px · z∈[-106,+146]mm · 2 of 43 slices shown (2 of 3)]
[im 1/43]
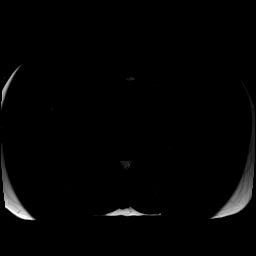
[im 43/43]
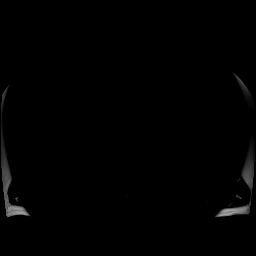

[Series 7: DWI · axial · 5.0mm · 1.42mm/px · z∈[-113,+109]mm · 5 of 114 slices shown (1 of 2)]
[im 1/114]
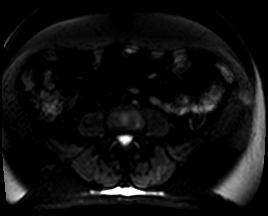
[im 29/114]
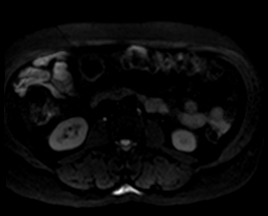
[im 57/114]
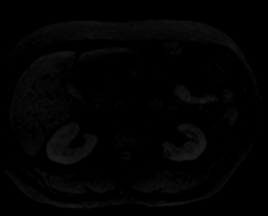
[im 85/114]
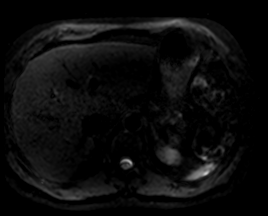
[im 114/114]
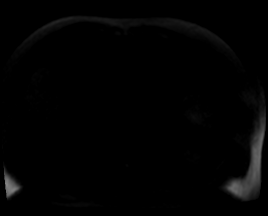

[Series 8: DWI · axial · 5.0mm · 1.42mm/px · z∈[-113,+109]mm · 2 of 38 slices shown (2 of 2)]
[im 1/38]
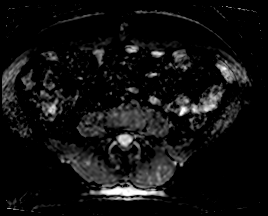
[im 38/38]
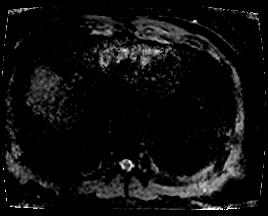

[Series 9: T2 · axial · 6.0mm · 1.25mm/px · 1 of 30 slices shown (3 of 3)]
[im 1/30]
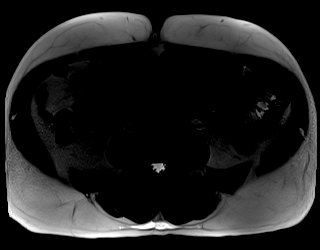

[Series 10: bSSFP · axial · 5.0mm · 1.25mm/px · z∈[-123,+99]mm · 2 of 38 slices shown]
[im 1/38]
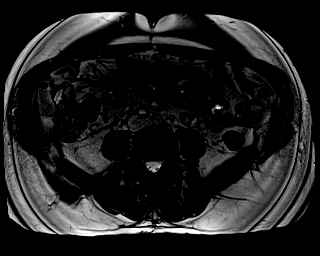
[im 38/38]
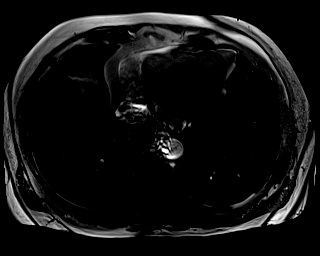

[Series 11: T1 dynamic · axial · non-contrast · 3.0mm · 1.25mm/px · z∈[-119,+94]mm · 3 of 72 slices shown]
[im 1/72]
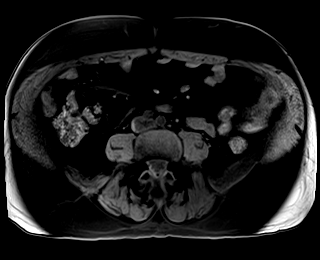
[im 36/72]
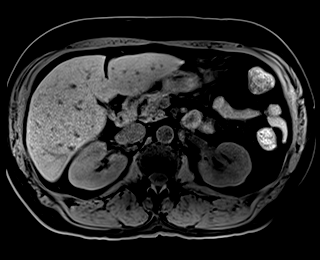
[im 72/72]
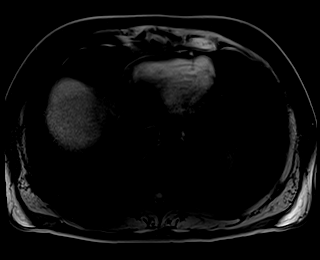

[Series 12: T1 dynamic post-contrast · axial · 3.0mm · 1.25mm/px · z∈[-119,+94]mm · 3 of 72 slices shown (1 of 9)]
[im 1/72]
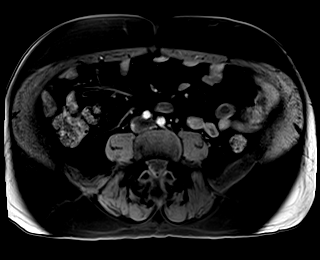
[im 36/72]
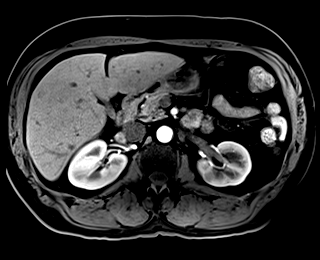
[im 72/72]
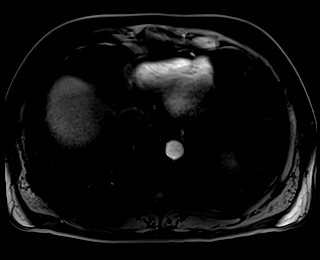

[Series 13: T1 dynamic post-contrast · axial · 3.0mm · 1.25mm/px · z∈[-119,+94]mm · 3 of 72 slices shown (2 of 9)]
[im 1/72]
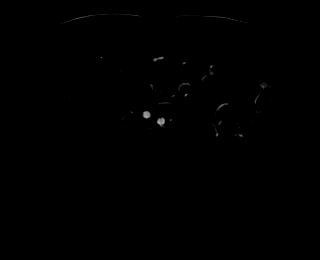
[im 36/72]
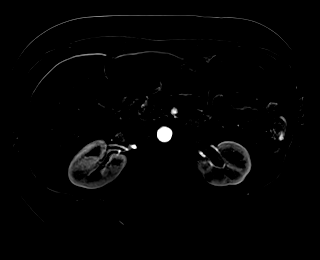
[im 72/72]
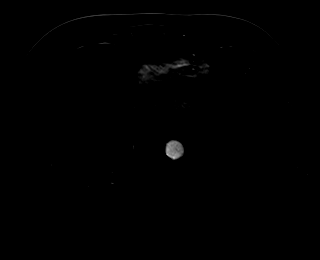

[Series 14: T1 dynamic post-contrast · axial · 3.0mm · 1.25mm/px · z∈[-119,+94]mm · 3 of 72 slices shown (3 of 9)]
[im 1/72]
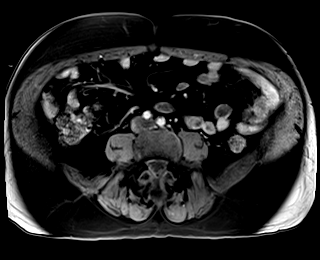
[im 36/72]
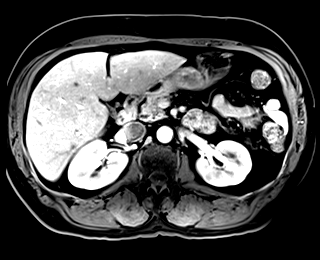
[im 72/72]
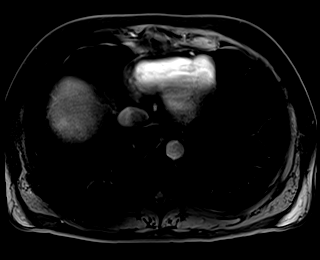

[Series 15: T1 dynamic post-contrast · axial · 3.0mm · 1.25mm/px · z∈[-119,+94]mm · 3 of 72 slices shown (4 of 9)]
[im 1/72]
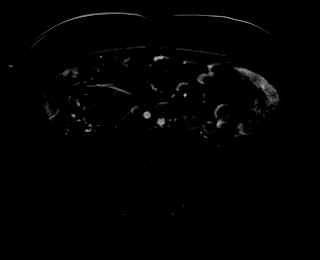
[im 36/72]
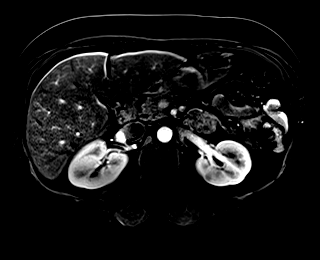
[im 72/72]
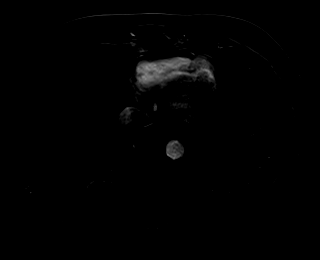

[Series 16: T1 dynamic post-contrast · axial · 3.0mm · 1.25mm/px · z∈[-119,+94]mm · 3 of 72 slices shown (5 of 9)]
[im 1/72]
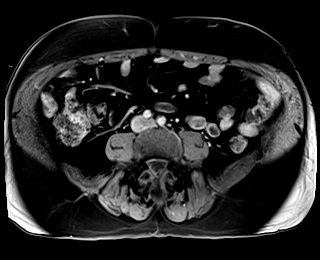
[im 36/72]
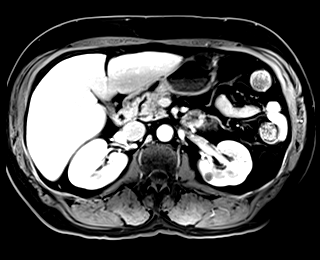
[im 72/72]
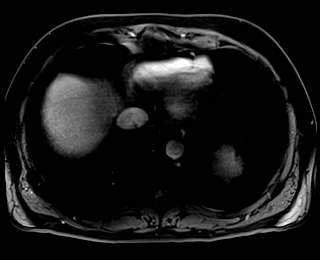

[Series 17: T1 dynamic post-contrast · axial · 3.0mm · 1.25mm/px · z∈[-119,+94]mm · 3 of 72 slices shown (6 of 9)]
[im 1/72]
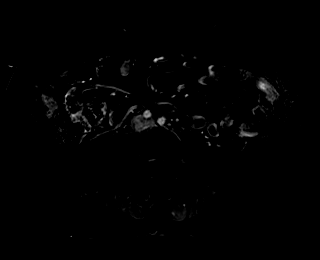
[im 36/72]
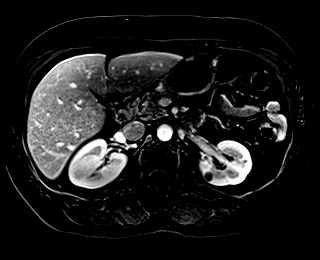
[im 72/72]
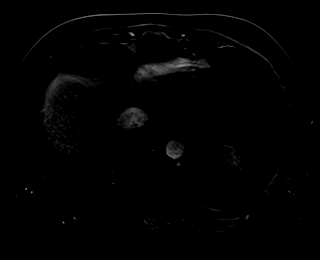

[Series 18: T1 dynamic post-contrast · coronal · 3.0mm · 1.25mm/px · 3 of 72 slices shown (7 of 9)]
[im 1/72]
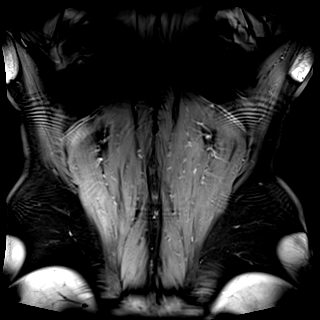
[im 36/72]
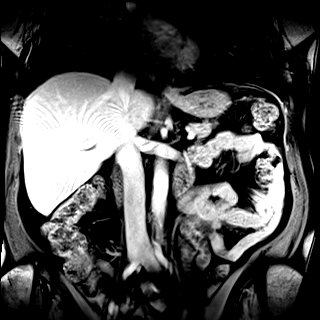
[im 72/72]
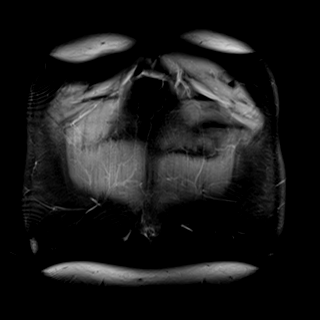

[Series 19: T1 dynamic post-contrast · axial · 3.0mm · 1.25mm/px · z∈[-119,+94]mm · 3 of 72 slices shown (8 of 9)]
[im 1/72]
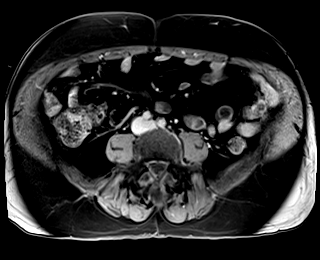
[im 36/72]
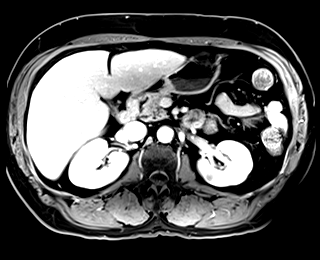
[im 72/72]
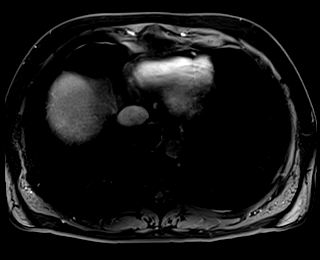

[Series 20: T1 dynamic post-contrast · axial · 3.0mm · 1.25mm/px · z∈[-119,+94]mm · 3 of 72 slices shown (9 of 9)]
[im 1/72]
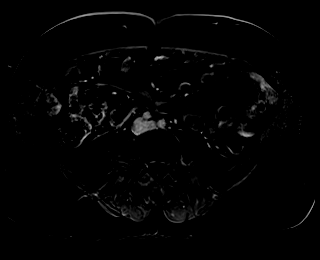
[im 36/72]
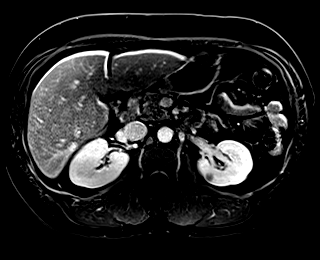
[im 72/72]
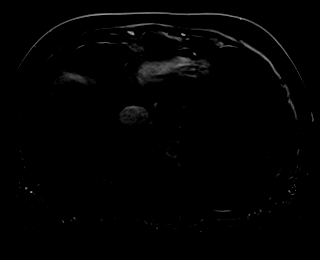

[48 of 48 positions shown; findings below may reference images not displayed]

FINDINGS: Lower chest: The lung bases are clear of acute process. No worrisome
pulmonary lesions or pleural or pericardial effusion.

Hepatobiliary: No focal hepatic lesions or intrahepatic biliary
dilatation. The gallbladder is normal. No common bile duct
dilatation.

Pancreas:  No mass, inflammation or ductal dilatation.

Spleen: Stable enhancing splenic lesions, likely benign hemangiomas.

Adrenals/Urinary Tract:  The adrenal glands are normal and stable.

Small bilateral simple appearing nonenhancing renal cysts. There is
also a stable complex cyst involving the midpole region of the left
kidney medially. This measures 13 mm and is unchanged since 1344. It
demonstrates increased T1 signal intensity and no appreciable
contrast enhancement. It most likely contains a small amount of
hemorrhage or proteinaceous debris.

The right kidney demonstrates a small, 8 mm upper pole cyst
anteriorly which is also complicated by probable hemorrhage or
proteinaceous debris but no worrisome enhancement. This is also
stable since 1344.

Stomach/Bowel: Visualized portions within the abdomen are
unremarkable.

Vascular/Lymphatic: No pathologically enlarged lymph nodes
identified. No abdominal aortic aneurysm demonstrated.

Other:  No ascites or abdominal wall hernia.

Musculoskeletal: No significant bony findings. Benign-appearing
hemangioma noted
IMPRESSION: 1. 13 mm left midpole renal lesion is consistent with a benign
hemorrhagic or proteinaceous cyst and is unchanged since CT scan of
January 07, 16 mm right upper pole renal lesion is likely a hemorrhagic or
proteinaceous cyst also stable since [DATE]. No worrisome renal lesions.
4. No acute abdominal findings.

## 2018-08-24 ENCOUNTER — Ambulatory Visit (INDEPENDENT_AMBULATORY_CARE_PROVIDER_SITE_OTHER): Payer: Medicare HMO | Admitting: Internal Medicine

## 2018-08-24 ENCOUNTER — Other Ambulatory Visit: Payer: Self-pay

## 2018-08-24 ENCOUNTER — Encounter: Payer: Self-pay | Admitting: Internal Medicine

## 2018-08-24 ENCOUNTER — Ambulatory Visit (INDEPENDENT_AMBULATORY_CARE_PROVIDER_SITE_OTHER): Payer: Medicare HMO | Admitting: Dietician

## 2018-08-24 ENCOUNTER — Encounter (INDEPENDENT_AMBULATORY_CARE_PROVIDER_SITE_OTHER): Payer: Self-pay

## 2018-08-24 ENCOUNTER — Encounter: Payer: Self-pay | Admitting: Dietician

## 2018-08-24 VITALS — BP 133/77 | HR 63

## 2018-08-24 DIAGNOSIS — Z7984 Long term (current) use of oral hypoglycemic drugs: Secondary | ICD-10-CM

## 2018-08-24 DIAGNOSIS — E119 Type 2 diabetes mellitus without complications: Secondary | ICD-10-CM | POA: Diagnosis not present

## 2018-08-24 DIAGNOSIS — E1121 Type 2 diabetes mellitus with diabetic nephropathy: Secondary | ICD-10-CM

## 2018-08-24 DIAGNOSIS — Z79899 Other long term (current) drug therapy: Secondary | ICD-10-CM

## 2018-08-24 DIAGNOSIS — Z23 Encounter for immunization: Secondary | ICD-10-CM | POA: Diagnosis not present

## 2018-08-24 DIAGNOSIS — I10 Essential (primary) hypertension: Secondary | ICD-10-CM

## 2018-08-24 LAB — POCT GLYCOSYLATED HEMOGLOBIN (HGB A1C): HEMOGLOBIN A1C: 6.5 % — AB (ref 4.0–5.6)

## 2018-08-24 LAB — GLUCOSE, CAPILLARY: Glucose-Capillary: 92 mg/dL (ref 70–99)

## 2018-08-24 MED ORDER — GLIPIZIDE 10 MG PO TABS: 10.0000 mg | ORAL_TABLET | Freq: Every day | ORAL | 4 refills | Status: DC

## 2018-08-24 NOTE — Patient Instructions (Signed)
Mr. Mahabir,  It was a pleasure seeing you today.  Please follow up in 3 months.

## 2018-08-24 NOTE — Progress Notes (Signed)
   CC: Follow-up on type 2 diabetes  HPI:  [name] is a 62 y.o. male with history noted below that presents to the internal medicine clinic for follow-up on type 2 diabetes.  Please see problem based charting for the status of patient's chronic medical conditions.  Past Medical History:  Diagnosis Date  . Atrial arrhythmia   . CAP (community acquired pneumonia)   . Cellulitis of knee, right 06/06/12  . Chronic otitis externa 06/06/12   C Multiple trials with antibiocs and antifungal. Cultures were positive for Pseudomonas. Follow up with ENT on 06/14/12    . CORONARY ARTERY DISEASE 11/25/2006   Cardiac cath by Dr Terrence Dupont 11/2003 LV showed good LV systolic function, EF of 04-88%. Left main was patent. LAD has 20-30% mid stenosis. Diagonal 1 and diagonal 2 were patent. Left circumflex was patent. OM1 was less than 0.5 mm which was diffusely diseased. OM2 has 20% ostial stenosis which was patent which  was moderate size. OM3 was patent at prior PTCA and stented site. RCA has 20-30% proximal   . Hypertension   . NSTEMI (non-ST elevated myocardial infarction) ([place]) 08/14/2012   S/p Successful PTCA to proximal OM-3     . TOBACCO ABUSE 11/25/2006  . Type II diabetes mellitus ([place])     Review of Systems:  Review of Systems  Respiratory: Negative for shortness of breath.   Cardiovascular: Negative for chest pain.  Psychiatric/Behavioral: Negative for depression.     Physical Exam:  Vitals:   08/24/18 1447  BP: 133/77  Pulse: 63  SpO2: 98%   Physical Exam  Constitutional: He is well-developed, well-nourished, and in no distress.  Cardiovascular: Normal rate, regular rhythm and normal heart sounds. Exam reveals no gallop and no friction rub.  No murmur heard. Pulmonary/Chest: Effort normal and breath sounds normal. No respiratory distress. He has no wheezes. He has no rales.  Musculoskeletal: He exhibits no edema.  Skin: Skin is warm and dry.     Assessment & Plan:   See  encounters tab for problem based medical decision making.   Patient discussed with Dr. Beryle Beams

## 2018-08-24 NOTE — Progress Notes (Signed)
Documentation for Freestyle Libre Pro Continuous glucose monitoring Freestyle Libre Pro CGM sensor placed and started on [name] who was identified by name and date of birth.  Patient was educated about wearing sensor, keeping food, activity and medication log and when to call office.He was educated about how to care for the sensor and not to have an MRI, CT or Diathermy while wearing the sensor and what he can learn from the Continuous glucose monitoring.  He verbalized understanding Follow up was arranged with the patient for 1 week to see a doctor and diabetes educator.  Lot #:[id number] Serial #:[id number] Expiration Date:02/27/2019 Debera Lat, RD 08/24/2018 2:51 PM.

## 2018-08-25 LAB — BMP8+ANION GAP
Anion Gap: 11 mmol/L (ref 10.0–18.0)
BUN/Creatinine Ratio: 15 (ref 10–24)
BUN: 14 mg/dL (ref 8–27)
CALCIUM: 9 mg/dL (ref 8.6–10.2)
CHLORIDE: 105 mmol/L (ref 96–106)
CO2: 26 mmol/L (ref 20–29)
Creatinine, Ser: 0.95 mg/dL (ref 0.76–1.27)
GFR calc Af Amer: 99 mL/min/{1.73_m2} (ref >59)
GFR calc non Af Amer: 86 mL/min/{1.73_m2} (ref >59)
GLUCOSE: 60 mg/dL — AB (ref 65–99)
Potassium: 4.2 mmol/L (ref 3.5–5.2)
Sodium: 142 mmol/L (ref 134–144)

## 2018-08-25 LAB — HEPATIC FUNCTION PANEL
ALT: 20 IU/L (ref 0–44)
AST: 16 IU/L (ref 0–40)
Albumin: 4 g/dL (ref 3.6–4.8)
Alkaline Phosphatase: 74 IU/L (ref 39–117)
Bilirubin Total: <0.2 mg/dL (ref 0.0–1.2)
Bilirubin, Direct: 0.08 mg/dL (ref 0.00–0.40)
TOTAL PROTEIN: 6.6 g/dL (ref 6.0–8.5)

## 2018-08-25 LAB — LIPID PANEL
Chol/HDL Ratio: 2.8 ratio (ref 0.0–5.0)
Cholesterol, Total: 121 mg/dL (ref 100–199)
HDL: 43 mg/dL (ref >39)
LDL Calculated: 62 mg/dL (ref 0–99)
Triglycerides: 79 mg/dL (ref 0–149)
VLDL CHOLESTEROL CAL: 16 mg/dL (ref 5–40)

## 2018-08-27 MED ORDER — SOTALOL HCL 80 MG PO TABS: 40.0000 mg | ORAL_TABLET | Freq: Two times a day (BID) | ORAL | 0 refills | Status: DC

## 2018-08-27 NOTE — Assessment & Plan Note (Addendum)
Assessment: Essential hypertension Blood pressure is 133/77 stable and at goal with current medication of IMDUR and sotalol.  Plan -Continue current medications -completed labs requested by cardiology including bmet -Encouraged patient to continue follow-up with cardiology

## 2018-08-27 NOTE — Assessment & Plan Note (Signed)
Assessment;  Type II diabetes mellitus Patient currently takes metformin 1000mg  BID and glipizid 10mg  daily.  Hemoglobin A1C today is 6.5  Plan -continue current medications -opthalmology referral  - will complete labs requested per cardiology including lipid, liver profile

## 2018-08-28 NOTE — Progress Notes (Signed)
Medicine attending: Medical history, presenting problems, physical findings, and medications, reviewed with resident physician Dr Jessica Hoffman on the day of the patient visit and I concur with her evaluation and management plan. 

## 2018-08-29 ENCOUNTER — Telehealth: Payer: Self-pay | Admitting: Dietician

## 2018-08-29 NOTE — Telephone Encounter (Signed)
[name] needed appointments for CGM follow up. He could not come in this week but agreed to Monday afternoon.

## 2018-09-04 ENCOUNTER — Other Ambulatory Visit: Payer: Self-pay

## 2018-09-04 ENCOUNTER — Ambulatory Visit: Payer: Medicare HMO | Admitting: Student in an Organized Health Care Education/Training Program

## 2018-09-04 ENCOUNTER — Ambulatory Visit: Payer: Medicare HMO | Admitting: Dietician

## 2018-09-04 ENCOUNTER — Encounter: Payer: Self-pay | Admitting: Dietician

## 2018-09-04 VITALS — BP 118/61 | HR 57 | Temp 98.1°F | Ht 75.0 in | Wt 256.7 lb

## 2018-09-04 DIAGNOSIS — E1121 Type 2 diabetes mellitus with diabetic nephropathy: Secondary | ICD-10-CM

## 2018-09-04 NOTE — Progress Notes (Signed)
CGM Download #1 Docu mention:   patient's sensor was still on, but it only recorded 2 hours and the cannula was bent sideways when the sensor was removed. [name] is wiling to have a re[placement sensor placed. We are currently out of Continuous glucose monitoring sensor and he agreed to have [place]  call him to schedule this when more sensors arrive.  Debera Lat, RD 09/04/2018 3:52 PM.

## 2018-09-05 DIAGNOSIS — M5136 Other intervertebral disc degeneration, lumbar region: Secondary | ICD-10-CM | POA: Diagnosis not present

## 2018-09-08 ENCOUNTER — Telehealth: Payer: Self-pay | Admitting: Dietician

## 2018-09-08 DIAGNOSIS — E785 Hyperlipidemia, unspecified: Secondary | ICD-10-CM | POA: Diagnosis not present

## 2018-09-08 DIAGNOSIS — I480 Paroxysmal atrial fibrillation: Secondary | ICD-10-CM | POA: Diagnosis not present

## 2018-09-08 DIAGNOSIS — I10 Essential (primary) hypertension: Secondary | ICD-10-CM | POA: Diagnosis not present

## 2018-09-08 DIAGNOSIS — I2510 Atherosclerotic heart disease of native coronary artery without angina pectoris: Secondary | ICD-10-CM | POA: Diagnosis not present

## 2018-09-08 DIAGNOSIS — M1990 Unspecified osteoarthritis, unspecified site: Secondary | ICD-10-CM | POA: Diagnosis not present

## 2018-09-08 DIAGNOSIS — E119 Type 2 diabetes mellitus without complications: Secondary | ICD-10-CM | POA: Diagnosis not present

## 2018-09-08 NOTE — Telephone Encounter (Signed)
Left message that sensors are in and we can schedule an appointment to replace his sensor that stopped working.

## 2018-09-12 NOTE — Progress Notes (Signed)
Failed CGM download, device replaced, will try again next week. No charge visit.

## 2018-09-20 DIAGNOSIS — M5136 Other intervertebral disc degeneration, lumbar region: Secondary | ICD-10-CM | POA: Diagnosis not present

## 2018-09-20 DIAGNOSIS — G894 Chronic pain syndrome: Secondary | ICD-10-CM | POA: Diagnosis not present

## 2018-09-20 DIAGNOSIS — M47816 Spondylosis without myelopathy or radiculopathy, lumbar region: Secondary | ICD-10-CM | POA: Diagnosis not present

## 2018-09-20 DIAGNOSIS — Z79891 Long term (current) use of opiate analgesic: Secondary | ICD-10-CM | POA: Diagnosis not present

## 2018-09-29 ENCOUNTER — Encounter: Payer: Self-pay | Admitting: Dietician

## 2018-09-29 ENCOUNTER — Ambulatory Visit: Payer: Medicare HMO | Admitting: Dietician

## 2018-09-29 ENCOUNTER — Encounter (INDEPENDENT_AMBULATORY_CARE_PROVIDER_SITE_OTHER): Payer: Self-pay

## 2018-09-29 DIAGNOSIS — E1121 Type 2 diabetes mellitus with diabetic nephropathy: Secondary | ICD-10-CM

## 2018-09-29 NOTE — Progress Notes (Signed)
Documentation for Freestyle Libre Pro Continuous glucose monitoring. This was a replacement of a previous sensor that failed to record any data. [name] presented accompanied by a male who volunteered to help him keep his food and activity records.  Freestyle Libre Pro CGM sensor placed today. Patient was educated about wearing sensor, keeping food, activity and medication log and when to call office. Patient was educated about how to care for the sensor and not to have an MRI, CT or Diathermy while wearing the sensor. Follow up was arranged with the patient for 1 week.   Lot #: [id number] Serial #: [facility] Expiration Date:03/29/2019  Debera Lat, RD 09/29/2018 3:48 PM.

## 2018-10-06 ENCOUNTER — Encounter: Payer: Self-pay | Admitting: Internal Medicine

## 2018-10-06 ENCOUNTER — Ambulatory Visit (INDEPENDENT_AMBULATORY_CARE_PROVIDER_SITE_OTHER): Payer: Medicare HMO | Admitting: Internal Medicine

## 2018-10-06 ENCOUNTER — Other Ambulatory Visit: Payer: Self-pay

## 2018-10-06 DIAGNOSIS — I2510 Atherosclerotic heart disease of native coronary artery without angina pectoris: Secondary | ICD-10-CM | POA: Diagnosis not present

## 2018-10-06 DIAGNOSIS — Z79899 Other long term (current) drug therapy: Secondary | ICD-10-CM

## 2018-10-06 DIAGNOSIS — I10 Essential (primary) hypertension: Secondary | ICD-10-CM

## 2018-10-06 DIAGNOSIS — E1142 Type 2 diabetes mellitus with diabetic polyneuropathy: Secondary | ICD-10-CM

## 2018-10-06 DIAGNOSIS — K219 Gastro-esophageal reflux disease without esophagitis: Secondary | ICD-10-CM | POA: Diagnosis not present

## 2018-10-06 DIAGNOSIS — G629 Polyneuropathy, unspecified: Secondary | ICD-10-CM

## 2018-10-06 DIAGNOSIS — Z7984 Long term (current) use of oral hypoglycemic drugs: Secondary | ICD-10-CM | POA: Diagnosis not present

## 2018-10-06 DIAGNOSIS — E1121 Type 2 diabetes mellitus with diabetic nephropathy: Secondary | ICD-10-CM

## 2018-10-06 MED ORDER — EMPAGLIFLOZIN 10 MG PO TABS: 10.0000 mg | ORAL_TABLET | Freq: Every day | ORAL | 2 refills | Status: DC

## 2018-10-06 MED ORDER — OMEPRAZOLE 20 MG PO CPDR: 20.0000 mg | DELAYED_RELEASE_CAPSULE | Freq: Every day | ORAL | 1 refills | Status: DC

## 2018-10-06 MED ORDER — GABAPENTIN 300 MG PO CAPS: 600.0000 mg | ORAL_CAPSULE | Freq: Three times a day (TID) | ORAL | 3 refills | Status: DC

## 2018-10-06 NOTE — Patient Instructions (Addendum)
It was a pleasure taking care of you today, [name]!   For your diabetes - Stop taking glipizide - Start taking Jardiance 10mg  daily  For your reflux - I have refilled your Prilosec  For your gabapentin - I have refilled your Gabapentin  You should follow-up with your PCP for continued diabetes care.  Feel free to call our clinic at [phone] if you have any questions.  Thanks, Dr. Annie Paras

## 2018-10-06 NOTE — Progress Notes (Signed)
   CC: CGM download  HPI:   [name] is a 62 y.o. male with medical history of CAD, HTN, and DMII as well as the other medical conditions listed below who presents to the internal medicine clinic for CGM download #1. Please see problem based charting for the status of the patient's current and chronic medical conditions.   Past Medical History:  Diagnosis Date  . Atrial arrhythmia   . CAP (community acquired pneumonia)   . Cellulitis of knee, right 06/06/12  . Chronic otitis externa 06/06/12   C Multiple trials with antibiocs and antifungal. Cultures were positive for Pseudomonas. Follow up with ENT on 06/14/12    . CORONARY ARTERY DISEASE 11/25/2006   Cardiac cath by Dr Terrence Dupont 11/2003 LV showed good LV systolic function, EF of 37-04%. Left main was patent. LAD has 20-30% mid stenosis. Diagonal 1 and diagonal 2 were patent. Left circumflex was patent. OM1 was less than 0.5 mm which was diffusely diseased. OM2 has 20% ostial stenosis which was patent which  was moderate size. OM3 was patent at prior PTCA and stented site. RCA has 20-30% proximal   . Hypertension   . NSTEMI (non-ST elevated myocardial infarction) ([place]) 08/14/2012   S/p Successful PTCA to proximal OM-3     . TOBACCO ABUSE 11/25/2006  . Type II diabetes mellitus ([place])     Review of Systems:   Pertinent positives mentioned in HPI. Remainder of all ROS negative.   Physical Exam: Vitals:   10/06/18 1454  BP: 116/64  Pulse: 67  Temp: 98.1 F (36.7 C)  TempSrc: Oral  SpO2: 100%  Weight: 256 lb 4.8 oz (116.3 kg)  Height: 6\' 3"  (1.905 m)   Physical Exam  Constitutional: Well-developed, well-nourished, and in no distress.  Eyes: Pupils are equal, round, and reactive to light. EOM are normal.  Cardiovascular: Normal rate and regular rhythm. No murmurs, rubs, or gallops. Pulmonary/Chest: Effort normal. Clear to auscultation bilaterally. No wheezes, rales, or rhonchi. Abdominal: Soft, non-distended, non-tender. Ext:  CGM in place on the posterior upper right arm. Trace bilateral lower extremity edema. Skin: Warm and dry. No rashes or wounds.   Assessment & Plan:   See Encounters Tab for problem based charting.  Patient seen with Dr. Angelia Mould

## 2018-10-06 NOTE — Assessment & Plan Note (Signed)
[name] wore the CGM for 7 days, however it only recorded 2.5 days. His meter also malfunctioned at the last visit, so a new one was placed. When the CGM was removed, the filament was not bent and it appeared to be appropriately placed. The average reading was 109, % time in target was 91, % time below target was 9, and % time above target was 0%.  Current diabetes regimen includes metformin 1,000mg  BID and glipizide 10mg  daily. The lowest reading was 59 and all of his lows were documented in the same evening. He reports he did not remember feeling shaky or sweaty during that time. He does not check his blood sugars at home. Intervention will be to discontinue glipizide and start Jardiance 10mg  daily. The patient does not want to continue with CGM as the last two attempts have not worked as expected.   Plan 1. Removed CGM today 2. Discontinue glipizide 3. Start Jardiance 10mg  daily 4. Continue metformin 1,000mg  BID

## 2018-10-06 NOTE — Assessment & Plan Note (Signed)
Patient has run out of his gabapentin. He normally takes 600mg  TID, which improves his neuropathy symptoms.   Plan 1. Refilled gabapentin

## 2018-10-06 NOTE — Assessment & Plan Note (Signed)
The patient reports that his GERD symptoms have returned and occur at night. He attributes this to drinking hot chocolate before bed. He has not required medical therapy in years, but would like to resume his Prilosec.  Plan 1. Refilled prilosec 20mg  daily

## 2018-10-09 NOTE — Progress Notes (Signed)
Internal Medicine Clinic Attending  I saw and evaluated the patient.  I personally confirmed the key portions of the history and exam documented by Dr. Dorrell and I reviewed pertinent patient test results.  The assessment, diagnosis, and plan were formulated together and I agree with the documentation in the resident's note. 

## 2018-10-19 ENCOUNTER — Other Ambulatory Visit: Payer: Self-pay | Admitting: Internal Medicine

## 2018-11-06 ENCOUNTER — Other Ambulatory Visit: Payer: Self-pay | Admitting: Internal Medicine

## 2018-11-07 DIAGNOSIS — E119 Type 2 diabetes mellitus without complications: Secondary | ICD-10-CM | POA: Diagnosis not present

## 2018-11-07 DIAGNOSIS — H25813 Combined forms of age-related cataract, bilateral: Secondary | ICD-10-CM | POA: Diagnosis not present

## 2018-11-07 LAB — HM DIABETES EYE EXAM

## 2018-11-13 ENCOUNTER — Encounter: Payer: Self-pay | Admitting: *Deleted

## 2018-11-16 DIAGNOSIS — Z79891 Long term (current) use of opiate analgesic: Secondary | ICD-10-CM | POA: Diagnosis not present

## 2018-11-16 DIAGNOSIS — M5136 Other intervertebral disc degeneration, lumbar region: Secondary | ICD-10-CM | POA: Diagnosis not present

## 2018-11-16 DIAGNOSIS — G894 Chronic pain syndrome: Secondary | ICD-10-CM | POA: Diagnosis not present

## 2018-11-16 DIAGNOSIS — M47816 Spondylosis without myelopathy or radiculopathy, lumbar region: Secondary | ICD-10-CM | POA: Diagnosis not present

## 2018-11-27 ENCOUNTER — Encounter: Payer: Self-pay | Admitting: Internal Medicine

## 2018-11-27 ENCOUNTER — Encounter (INDEPENDENT_AMBULATORY_CARE_PROVIDER_SITE_OTHER): Payer: Self-pay

## 2018-11-27 ENCOUNTER — Ambulatory Visit (INDEPENDENT_AMBULATORY_CARE_PROVIDER_SITE_OTHER): Payer: Medicare HMO | Admitting: Internal Medicine

## 2018-11-27 VITALS — BP 121/88 | HR 90 | Temp 97.7°F

## 2018-11-27 DIAGNOSIS — R1032 Left lower quadrant pain: Secondary | ICD-10-CM | POA: Diagnosis not present

## 2018-11-27 DIAGNOSIS — K219 Gastro-esophageal reflux disease without esophagitis: Secondary | ICD-10-CM

## 2018-11-27 DIAGNOSIS — E1142 Type 2 diabetes mellitus with diabetic polyneuropathy: Secondary | ICD-10-CM | POA: Diagnosis not present

## 2018-11-27 DIAGNOSIS — R1033 Periumbilical pain: Secondary | ICD-10-CM | POA: Diagnosis not present

## 2018-11-27 DIAGNOSIS — R1031 Right lower quadrant pain: Secondary | ICD-10-CM | POA: Diagnosis not present

## 2018-11-27 DIAGNOSIS — R109 Unspecified abdominal pain: Secondary | ICD-10-CM

## 2018-11-27 DIAGNOSIS — R112 Nausea with vomiting, unspecified: Secondary | ICD-10-CM

## 2018-11-27 DIAGNOSIS — Q6102 Congenital multiple renal cysts: Secondary | ICD-10-CM

## 2018-11-27 DIAGNOSIS — G8929 Other chronic pain: Secondary | ICD-10-CM

## 2018-11-27 DIAGNOSIS — I10 Essential (primary) hypertension: Secondary | ICD-10-CM

## 2018-11-27 DIAGNOSIS — Z79899 Other long term (current) drug therapy: Secondary | ICD-10-CM

## 2018-11-27 DIAGNOSIS — D3500 Benign neoplasm of unspecified adrenal gland: Secondary | ICD-10-CM | POA: Diagnosis not present

## 2018-11-27 DIAGNOSIS — I4891 Unspecified atrial fibrillation: Secondary | ICD-10-CM

## 2018-11-27 DIAGNOSIS — I252 Old myocardial infarction: Secondary | ICD-10-CM | POA: Diagnosis not present

## 2018-11-27 DIAGNOSIS — M545 Low back pain: Secondary | ICD-10-CM

## 2018-11-27 DIAGNOSIS — Z7984 Long term (current) use of oral hypoglycemic drugs: Secondary | ICD-10-CM

## 2018-11-27 LAB — GLUCOSE, CAPILLARY: Glucose-Capillary: 196 mg/dL — ABNORMAL HIGH (ref 70–99)

## 2018-11-27 MED ORDER — PROMETHAZINE HCL 12.5 MG PO TABS: 12.5000 mg | ORAL_TABLET | Freq: Four times a day (QID) | ORAL | 0 refills | Status: DC | PRN

## 2018-11-27 NOTE — Progress Notes (Signed)
   CC: nausea  HPI:  Mr.Dontrez S Fatula is a 62 y.o. with a PMH of NSTEMI, T2DM with peripheral neuropathy, HTN, A fib, GERD, chronic low back pain, and hemorrhagic renal cysts presenting to clinic for nausea and feeling poorly.  Patient reports 3wk h/o of intermittent nausea with occasional vomiting NBNB, mild dizziness, abdominal pain, diarrhea, and feeling poorly. All these symptoms usually occur together and occur 3-4 times a week, lasting most of the day. He states symptoms are worst in the morning and improve throughout the day. He has not noticed a difference in symptom onset or relief with eating. He denies chest pain, shortness of breath, melena, hematochezia. His last BM was yesterday and was formed.  Please see problem based Assessment and Plan for status of patients chronic conditions.  Past Medical History:  Diagnosis Date  . Atrial arrhythmia   . CAP (community acquired pneumonia)   . Cellulitis of knee, right 06/06/12  . Chronic otitis externa 06/06/12   C Multiple trials with antibiocs and antifungal. Cultures were positive for Pseudomonas. Follow up with ENT on 06/14/12    . CORONARY ARTERY DISEASE 11/25/2006   Cardiac cath by Dr Terrence Dupont 11/2003 LV showed good LV systolic function, EF of 40-97%. Left main was patent. LAD has 20-30% mid stenosis. Diagonal 1 and diagonal 2 were patent. Left circumflex was patent. OM1 was less than 0.5 mm which was diffusely diseased. OM2 has 20% ostial stenosis which was patent which  was moderate size. OM3 was patent at prior PTCA and stented site. RCA has 20-30% proximal   . Hypertension   . NSTEMI (non-ST elevated myocardial infarction) ([place]) 08/14/2012   S/p Successful PTCA to proximal OM-3     . TOBACCO ABUSE 11/25/2006  . Type II diabetes mellitus ([place])     Review of Systems:   Per HPI  Physical Exam:  Vitals:   11/27/18 0908 11/27/18 0930  BP: (!) 153/88 121/88  Pulse: 68 90  Temp: 97.7 F (36.5 C)   TempSrc: Oral   SpO2: 99%     GENERAL- alert, co-operative, appears as stated age, not in any distress. HEENT- PERRL, EOMI, oral mucosa appears moist. CARDIAC- RRR, no murmurs, rubs or gallops. RESP- Moving equal volumes of air, and clear to auscultation bilaterally, no wheezes or crackles. ABDOMEN- Soft, mild TTP in periumbilical and bil lower quadrants NEURO- CN 2-12 intact, strength and sensation intact throughout, heel-to-shin normal bil, no pronator drift, gait normal. EXTREMITIES- pulse 2+ radial, symmetric, no pedal edema. SKIN- Warm, dry, no rash or lesion.  Assessment & Plan:   See Encounters Tab for problem based charting.   Patient discussed with Dr. Angelia Mould   Alphonzo Grieve, MD Internal Medicine PGY-3

## 2018-11-27 NOTE — Assessment & Plan Note (Addendum)
Patient with intermittent N/V/Abd pain and feeling poorly; symptoms were occurring during visit today.  CBG today was 190 so hypoglycemia not cause. BP initially mildly elevated however improved on recheck w/o change in symptoms so HTN and pheochromocytoma not likely to be contributing. Pulse and rhythm were <100 and regular making symptomatic a fib less likely to be contributing to symptoms. With focal abd pain to mainly lower quadrants, this is less likely to be anginal equivalent. With no association with eating or fasting, gastroparesis doesn't fit. He had a recent abd MRI for eval of renal masses which revealed hemorrhagic renal cysts w/o other findings.   He has no focal neuro deficits, however worse symptoms of N/V in the AM raise concern for intracranial mass. He takes his meds on empty stomach which could be precipitating symptoms (esp metformin).  Plan: --treat symptomatically with phenergan prn for now --advised to take meds with snack or meals, especially metformin --f/u CBC and CMET --f/u in 2 wks if no improvement, earlier if symptoms worsen or new symptoms arise; consider MRI brain if persistent  Addendum: CBC and CMET unremarkable other than hyperglycemia to 148

## 2018-11-27 NOTE — Patient Instructions (Addendum)
For your nausea, take phenergan one tablet every 6 hours as needed.  Try to make sure to take your medications with some food.  We'll check labs today and Ill call you if they are not normal.  Please come back in 2 wks if you are not feeling better or if new symptoms start.

## 2018-11-28 LAB — CMP14 + ANION GAP
A/G RATIO: 1.3 (ref 1.2–2.2)
ALBUMIN: 4 g/dL (ref 3.6–4.8)
ALT: 25 IU/L (ref 0–44)
AST: 24 IU/L (ref 0–40)
Alkaline Phosphatase: 81 IU/L (ref 39–117)
Anion Gap: 16 mmol/L (ref 10.0–18.0)
BUN/Creatinine Ratio: 11 (ref 10–24)
BUN: 12 mg/dL (ref 8–27)
Bilirubin Total: 0.4 mg/dL (ref 0.0–1.2)
CALCIUM: 9.5 mg/dL (ref 8.6–10.2)
CO2: 23 mmol/L (ref 20–29)
Chloride: 100 mmol/L (ref 96–106)
Creatinine, Ser: 1.08 mg/dL (ref 0.76–1.27)
GFR, EST AFRICAN AMERICAN: 85 mL/min/{1.73_m2} (ref >59)
GFR, EST NON AFRICAN AMERICAN: 74 mL/min/{1.73_m2} (ref >59)
GLOBULIN, TOTAL: 3 g/dL (ref 1.5–4.5)
Glucose: 148 mg/dL — ABNORMAL HIGH (ref 65–99)
POTASSIUM: 4.2 mmol/L (ref 3.5–5.2)
SODIUM: 139 mmol/L (ref 134–144)
TOTAL PROTEIN: 7 g/dL (ref 6.0–8.5)

## 2018-11-28 LAB — CBC WITH DIFFERENTIAL/PLATELET
Basophils Absolute: 0 10*3/uL (ref 0.0–0.2)
Basos: 0 %
EOS (ABSOLUTE): 0.1 10*3/uL (ref 0.0–0.4)
Eos: 2 %
Hematocrit: 47.2 % (ref 37.5–51.0)
Hemoglobin: 15.9 g/dL (ref 13.0–17.7)
IMMATURE GRANS (ABS): 0 10*3/uL (ref 0.0–0.1)
Immature Granulocytes: 0 %
LYMPHS: 37 %
Lymphocytes Absolute: 2 10*3/uL (ref 0.7–3.1)
MCH: 28.6 pg (ref 26.6–33.0)
MCHC: 33.7 g/dL (ref 31.5–35.7)
MCV: 85 fL (ref 79–97)
MONOS ABS: 0.2 10*3/uL (ref 0.1–0.9)
Monocytes: 4 %
NEUTROS ABS: 3 10*3/uL (ref 1.4–7.0)
Neutrophils: 57 %
PLATELETS: 183 10*3/uL (ref 150–450)
RBC: 5.55 x10E6/uL (ref 4.14–5.80)
RDW: 14 % (ref 12.3–15.4)
WBC: 5.4 10*3/uL (ref 3.4–10.8)

## 2018-12-04 NOTE — Progress Notes (Signed)
Internal Medicine Clinic Attending  Case discussed with Dr. Svalina  at the time of the visit.  We reviewed the resident's history and exam and pertinent patient test results.  I agree with the assessment, diagnosis, and plan of care documented in the resident's note.  

## 2018-12-08 ENCOUNTER — Other Ambulatory Visit: Payer: Self-pay | Admitting: Cardiology

## 2018-12-08 ENCOUNTER — Other Ambulatory Visit (HOSPITAL_COMMUNITY): Payer: Self-pay | Admitting: Cardiology

## 2018-12-08 DIAGNOSIS — E119 Type 2 diabetes mellitus without complications: Secondary | ICD-10-CM | POA: Diagnosis not present

## 2018-12-08 DIAGNOSIS — M1990 Unspecified osteoarthritis, unspecified site: Secondary | ICD-10-CM | POA: Diagnosis not present

## 2018-12-08 DIAGNOSIS — I25119 Atherosclerotic heart disease of native coronary artery with unspecified angina pectoris: Secondary | ICD-10-CM | POA: Diagnosis not present

## 2018-12-08 DIAGNOSIS — I4891 Unspecified atrial fibrillation: Secondary | ICD-10-CM | POA: Diagnosis not present

## 2018-12-08 DIAGNOSIS — E785 Hyperlipidemia, unspecified: Secondary | ICD-10-CM | POA: Diagnosis not present

## 2018-12-08 DIAGNOSIS — R079 Chest pain, unspecified: Secondary | ICD-10-CM

## 2018-12-08 DIAGNOSIS — I10 Essential (primary) hypertension: Secondary | ICD-10-CM | POA: Diagnosis not present

## 2018-12-20 ENCOUNTER — Ambulatory Visit (HOSPITAL_COMMUNITY): Admission: RE | Admit: 2018-12-20 | Payer: Medicare HMO | Source: Ambulatory Visit

## 2018-12-20 ENCOUNTER — Encounter (HOSPITAL_COMMUNITY): Payer: Self-pay

## 2018-12-23 ENCOUNTER — Other Ambulatory Visit: Payer: Self-pay

## 2018-12-23 ENCOUNTER — Inpatient Hospital Stay (HOSPITAL_COMMUNITY)
Admission: EM | Admit: 2018-12-23 | Discharge: 2018-12-26 | DRG: 247 | Disposition: A | Discharge status: Home / Self Care | Payer: Medicare HMO | Attending: Cardiovascular Disease | Admitting: Cardiovascular Disease

## 2018-12-23 ENCOUNTER — Encounter (HOSPITAL_COMMUNITY): Payer: Self-pay | Admitting: Emergency Medicine

## 2018-12-23 ENCOUNTER — Emergency Department (HOSPITAL_COMMUNITY): Payer: Medicare HMO

## 2018-12-23 DIAGNOSIS — E119 Type 2 diabetes mellitus without complications: Secondary | ICD-10-CM | POA: Diagnosis present

## 2018-12-23 DIAGNOSIS — I480 Paroxysmal atrial fibrillation: Secondary | ICD-10-CM | POA: Diagnosis not present

## 2018-12-23 DIAGNOSIS — I214 Non-ST elevation (NSTEMI) myocardial infarction: Secondary | ICD-10-CM | POA: Diagnosis not present

## 2018-12-23 DIAGNOSIS — I249 Acute ischemic heart disease, unspecified: Secondary | ICD-10-CM | POA: Diagnosis present

## 2018-12-23 DIAGNOSIS — I2584 Coronary atherosclerosis due to calcified coronary lesion: Secondary | ICD-10-CM | POA: Diagnosis present

## 2018-12-23 DIAGNOSIS — Z87891 Personal history of nicotine dependence: Secondary | ICD-10-CM

## 2018-12-23 DIAGNOSIS — Z7901 Long term (current) use of anticoagulants: Secondary | ICD-10-CM

## 2018-12-23 DIAGNOSIS — E785 Hyperlipidemia, unspecified: Secondary | ICD-10-CM | POA: Diagnosis not present

## 2018-12-23 DIAGNOSIS — Z79899 Other long term (current) drug therapy: Secondary | ICD-10-CM | POA: Diagnosis not present

## 2018-12-23 DIAGNOSIS — R079 Chest pain, unspecified: Secondary | ICD-10-CM | POA: Diagnosis not present

## 2018-12-23 DIAGNOSIS — Z7984 Long term (current) use of oral hypoglycemic drugs: Secondary | ICD-10-CM | POA: Diagnosis not present

## 2018-12-23 DIAGNOSIS — I252 Old myocardial infarction: Secondary | ICD-10-CM | POA: Diagnosis not present

## 2018-12-23 DIAGNOSIS — I10 Essential (primary) hypertension: Secondary | ICD-10-CM | POA: Diagnosis not present

## 2018-12-23 DIAGNOSIS — Z955 Presence of coronary angioplasty implant and graft: Secondary | ICD-10-CM

## 2018-12-23 DIAGNOSIS — Z9861 Coronary angioplasty status: Secondary | ICD-10-CM | POA: Diagnosis not present

## 2018-12-23 DIAGNOSIS — I25110 Atherosclerotic heart disease of native coronary artery with unstable angina pectoris: Secondary | ICD-10-CM | POA: Diagnosis not present

## 2018-12-23 DIAGNOSIS — I2510 Atherosclerotic heart disease of native coronary artery without angina pectoris: Secondary | ICD-10-CM | POA: Diagnosis not present

## 2018-12-23 LAB — APTT: aPTT: 53 seconds — ABNORMAL HIGH (ref 24–36)

## 2018-12-23 LAB — CBC
HCT: 41.7 % (ref 39.0–52.0)
Hemoglobin: 13.4 g/dL (ref 13.0–17.0)
MCH: 27.9 pg (ref 26.0–34.0)
MCHC: 32.1 g/dL (ref 30.0–36.0)
MCV: 86.7 fL (ref 80.0–100.0)
NRBC: 0 % (ref 0.0–0.2)
PLATELETS: 171 10*3/uL (ref 150–400)
RBC: 4.81 MIL/uL (ref 4.22–5.81)
RDW: 14.1 % (ref 11.5–15.5)
WBC: 7 10*3/uL (ref 4.0–10.5)

## 2018-12-23 LAB — HEPARIN LEVEL (UNFRACTIONATED): Heparin Unfractionated: 0.4 IU/mL (ref 0.30–0.70)

## 2018-12-23 LAB — BASIC METABOLIC PANEL
ANION GAP: 11 (ref 5–15)
BUN: 15 mg/dL (ref 8–23)
CALCIUM: 9.1 mg/dL (ref 8.9–10.3)
CO2: 26 mmol/L (ref 22–32)
Chloride: 103 mmol/L (ref 98–111)
Creatinine, Ser: 0.94 mg/dL (ref 0.61–1.24)
GFR calc Af Amer: >60 mL/min (ref >60)
Glucose, Bld: 182 mg/dL — ABNORMAL HIGH (ref 70–99)
Potassium: 4.3 mmol/L (ref 3.5–5.1)
SODIUM: 140 mmol/L (ref 135–145)

## 2018-12-23 LAB — GLUCOSE, CAPILLARY
Glucose-Capillary: 194 mg/dL — ABNORMAL HIGH (ref 70–99)
Glucose-Capillary: 103 mg/dL — ABNORMAL HIGH (ref 70–99)

## 2018-12-23 LAB — I-STAT TROPONIN, ED: TROPONIN I, POC: 0.14 ng/mL — AB (ref 0.00–0.08)

## 2018-12-23 LAB — TROPONIN I: Troponin I: 0.16 ng/mL (ref <0.03)

## 2018-12-23 MED ORDER — ASPIRIN 81 MG PO CHEW
324.0000 mg | CHEWABLE_TABLET | Freq: Once | ORAL | Status: AC
  Administered 2018-12-23: 324 mg via ORAL
  Filled 2018-12-23: qty 4

## 2018-12-23 MED ORDER — GLIPIZIDE 5 MG PO TABS
10.0000 mg | ORAL_TABLET | Freq: Every day | ORAL | Status: DC
  Administered 2018-12-23 – 2018-12-26 (×4): 10 mg via ORAL
  Filled 2018-12-23 (×4): qty 1
  Filled 2018-12-23: qty 2

## 2018-12-23 MED ORDER — PROMETHAZINE HCL 25 MG PO TABS
12.5000 mg | ORAL_TABLET | Freq: Four times a day (QID) | ORAL | Status: DC | PRN
  Administered 2018-12-24: 12.5 mg via ORAL
  Filled 2018-12-23: qty 1

## 2018-12-23 MED ORDER — ONDANSETRON HCL 4 MG/2ML IJ SOLN
4.0000 mg | Freq: Four times a day (QID) | INTRAMUSCULAR | Status: DC | PRN
  Filled 2018-12-23: qty 2

## 2018-12-23 MED ORDER — NITROGLYCERIN IN D5W 200-5 MCG/ML-% IV SOLN
0.0000 ug/min | INTRAVENOUS | Status: DC
  Administered 2018-12-23: 33.3 ug/min via INTRAVENOUS
  Administered 2018-12-23: 85 ug/min via INTRAVENOUS
  Administered 2018-12-24 – 2018-12-25 (×4): 100 ug/min via INTRAVENOUS
  Filled 2018-12-23 (×6): qty 250

## 2018-12-23 MED ORDER — MORPHINE SULFATE (PF) 2 MG/ML IV SOLN
2.0000 mg | INTRAVENOUS | Status: DC | PRN
  Administered 2018-12-23 – 2018-12-24 (×2): 2 mg via INTRAVENOUS
  Filled 2018-12-23 (×2): qty 1

## 2018-12-23 MED ORDER — NITROGLYCERIN 0.4 MG SL SUBL
0.4000 mg | SUBLINGUAL_TABLET | SUBLINGUAL | Status: AC | PRN
  Administered 2018-12-26 (×3): 0.4 mg via SUBLINGUAL
  Filled 2018-12-23: qty 1

## 2018-12-23 MED ORDER — PANTOPRAZOLE SODIUM 40 MG PO TBEC
40.0000 mg | DELAYED_RELEASE_TABLET | Freq: Every day | ORAL | Status: DC
  Administered 2018-12-24 – 2018-12-26 (×3): 40 mg via ORAL
  Filled 2018-12-23 (×4): qty 1

## 2018-12-23 MED ORDER — ASPIRIN EC 81 MG PO TBEC
81.0000 mg | DELAYED_RELEASE_TABLET | Freq: Every day | ORAL | Status: DC
  Administered 2018-12-24 – 2018-12-26 (×4): 81 mg via ORAL
  Filled 2018-12-23 (×4): qty 1

## 2018-12-23 MED ORDER — ZOLPIDEM TARTRATE 5 MG PO TABS
5.0000 mg | ORAL_TABLET | Freq: Once | ORAL | Status: AC
  Administered 2018-12-23: 5 mg via ORAL
  Filled 2018-12-23: qty 1

## 2018-12-23 MED ORDER — ASPIRIN 81 MG PO CHEW: 324.0000 mg | CHEWABLE_TABLET | ORAL | Status: AC

## 2018-12-23 MED ORDER — MORPHINE SULFATE (PF) 4 MG/ML IV SOLN
4.0000 mg | Freq: Once | INTRAVENOUS | Status: AC
  Administered 2018-12-23: 4 mg via INTRAVENOUS
  Filled 2018-12-23: qty 1

## 2018-12-23 MED ORDER — ASPIRIN 300 MG RE SUPP: 300.0000 mg | RECTAL | Status: AC

## 2018-12-23 MED ORDER — CANAGLIFLOZIN 100 MG PO TABS
100.0000 mg | ORAL_TABLET | Freq: Every day | ORAL | Status: DC
  Administered 2018-12-24 – 2018-12-26 (×3): 100 mg via ORAL
  Filled 2018-12-23 (×4): qty 1

## 2018-12-23 MED ORDER — ACETAMINOPHEN 325 MG PO TABS
650.0000 mg | ORAL_TABLET | ORAL | Status: DC | PRN
  Administered 2018-12-26: 08:00:00 650 mg via ORAL
  Filled 2018-12-23: qty 2

## 2018-12-23 MED ORDER — HEPARIN (PORCINE) 25000 UT/250ML-% IV SOLN
1900.0000 [IU]/h | INTRAVENOUS | Status: DC
  Administered 2018-12-23: 1500 [IU]/h via INTRAVENOUS
  Administered 2018-12-23: 1700 [IU]/h via INTRAVENOUS
  Administered 2018-12-24 – 2018-12-25 (×2): 1900 [IU]/h via INTRAVENOUS
  Filled 2018-12-23 (×4): qty 250

## 2018-12-23 MED ORDER — ROSUVASTATIN CALCIUM 20 MG PO TABS
40.0000 mg | ORAL_TABLET | Freq: Every day | ORAL | Status: DC
  Administered 2018-12-23 – 2018-12-25 (×3): 40 mg via ORAL
  Filled 2018-12-23 (×3): qty 2

## 2018-12-23 MED ORDER — SOTALOL HCL 80 MG PO TABS
40.0000 mg | ORAL_TABLET | Freq: Every day | ORAL | Status: DC
  Administered 2018-12-23 – 2018-12-26 (×4): 40 mg via ORAL
  Filled 2018-12-23 (×4): qty 0.5

## 2018-12-23 MED ORDER — SODIUM CHLORIDE 0.9% FLUSH: 3.0000 mL | Freq: Once | INTRAVENOUS | Status: DC

## 2018-12-23 MED ORDER — ONDANSETRON HCL 4 MG/2ML IJ SOLN
4.0000 mg | Freq: Once | INTRAMUSCULAR | Status: AC
  Administered 2018-12-23: 4 mg via INTRAVENOUS
  Filled 2018-12-23: qty 2

## 2018-12-23 MED ORDER — METFORMIN HCL 500 MG PO TABS
1000.0000 mg | ORAL_TABLET | Freq: Two times a day (BID) | ORAL | Status: DC
  Administered 2018-12-23: 1000 mg via ORAL
  Filled 2018-12-23: qty 2

## 2018-12-23 MED ORDER — NON FORMULARY: 10.0000 mg | Freq: Every day | Status: DC

## 2018-12-23 NOTE — Progress Notes (Signed)
ANTICOAGULATION CONSULT NOTE - Initial Consult  Pharmacy Consult:  Heparin Indication: chest pain/ACS  Allergies  Allergen Reactions  . Clindamycin/Lincomycin Diarrhea and Nausea And Vomiting    Patient Measurements: Height: 6\' 3"  (190.5 cm) Weight: 245 lb (111.1 kg) IBW/kg (Calculated) : 84.5 Heparin Dosing Weight: 107 kg  Vital Signs: Temp: 98 F (36.7 C) (01/25 0646) Temp Source: Oral (01/25 0646) BP: 142/76 (01/25 0646) Pulse Rate: 78 (01/25 0646)  Labs: Recent Labs    12/23/18 0647  HGB 13.4  HCT 41.7  PLT 171  CREATININE 0.94    Estimated Creatinine Clearance: 109.6 mL/min (by C-G formula based on SCr of 0.94 mg/dL).   Medical History: Past Medical History:  Diagnosis Date  . Atrial arrhythmia   . CAP (community acquired pneumonia)   . Cellulitis of knee, right 06/06/12  . Chronic otitis externa 06/06/12   C Multiple trials with antibiocs and antifungal. Cultures were positive for Pseudomonas. Follow up with ENT on 06/14/12    . CORONARY ARTERY DISEASE 11/25/2006   Cardiac cath by Dr Terrence Dupont 11/2003 LV showed good LV systolic function, EF of 58-09%. Left main was patent. LAD has 20-30% mid stenosis. Diagonal 1 and diagonal 2 were patent. Left circumflex was patent. OM1 was less than 0.5 mm which was diffusely diseased. OM2 has 20% ostial stenosis which was patent which  was moderate size. OM3 was patent at prior PTCA and stented site. RCA has 20-30% proximal   . Hypertension   . NSTEMI (non-ST elevated myocardial infarction) ([place]) 08/14/2012   S/p Successful PTCA to proximal OM-3     . TOBACCO ABUSE 11/25/2006  . Type II diabetes mellitus ([place])      Assessment: 70 YOM with history of Afib on Xarelto PTA, last dose 1/24 around 0800 per patient.  He presented to the ED with chest pain and Pharmacy consulted to transition patient to IV heparin.  Baseline labs reviewed.  Goal of Therapy:  Heparin level 0.3-0.7 units/ml Monitor platelets by anticoagulation  protocol: Yes   Plan:  Heparin gtt at 1500 units/hr, no bolus Check 6 hr heparin level and aPTT Daily heparin level, aPTT and CBC   Hashim Eichhorst D. Mina Marble, PharmD, BCPS, [place] 12/23/2018, 8:42 AM

## 2018-12-23 NOTE — H&P (Signed)
Referring Physician:  [name] is an 63 y.o. male.                       Chief Complaint: Chest pain  HPI: 63 year old has left sided, non-radiating chest pain since around mid-night. No shortness of breath, nausea or sweating spell. He has some relief with morphine and NTG drip. His Troponin I is slightly elevated at 0.14. He has PMH of stent in OM 3, type 2 DM, Paroxysmal atrial fibrillation on Xarelto, Tobacco use disorder, recently quit smoking, GERD and Hypertension.  Past Medical History:  Diagnosis Date  . Atrial arrhythmia   . CAP (community acquired pneumonia)   . Cellulitis of knee, right 06/06/12  . Chronic otitis externa 06/06/12   C Multiple trials with antibiocs and antifungal. Cultures were positive for Pseudomonas. Follow up with ENT on 06/14/12    . CORONARY ARTERY DISEASE 11/25/2006   Cardiac cath by Dr Terrence Dupont 11/2003 LV showed good LV systolic function, EF of 37-16%. Left main was patent. LAD has 20-30% mid stenosis. Diagonal 1 and diagonal 2 were patent. Left circumflex was patent. OM1 was less than 0.5 mm which was diffusely diseased. OM2 has 20% ostial stenosis which was patent which  was moderate size. OM3 was patent at prior PTCA and stented site. RCA has 20-30% proximal   . Hypertension   . NSTEMI (non-ST elevated myocardial infarction) ([place]) 08/14/2012   S/p Successful PTCA to proximal OM-3     . TOBACCO ABUSE 11/25/2006  . Type II diabetes mellitus ([place])       Past Surgical History:  Procedure Laterality Date  . CORONARY ANGIOPLASTY WITH STENT PLACEMENT  ~ 2005   "1"  . CYSTECTOMY  1990's   LUA  . LEFT HEART CATHETERIZATION WITH CORONARY ANGIOGRAM N/A 08/15/2012   Procedure: LEFT HEART CATHETERIZATION WITH CORONARY ANGIOGRAM;  Surgeon: Clent Demark, MD;  Location: [facility] CATH LAB;  Service: Cardiovascular;  Laterality: N/A;  . PERCUTANEOUS CORONARY STENT INTERVENTION (PCI-S)  08/15/2012   Procedure: PERCUTANEOUS CORONARY STENT INTERVENTION (PCI-S);  Surgeon:  Clent Demark, MD;  Location: [facility] CATH LAB;  Service: Cardiovascular;;    Family History  Problem Relation Age of Onset  . Cancer Mother    Social History:  reports that he quit smoking about 5 months ago. His smoking use included cigarettes. He has a 2.40 pack-year smoking history. He has never used smokeless tobacco. He reports current alcohol use. He reports that he does not use drugs.  Allergies:  Allergies  Allergen Reactions  . Clindamycin/Lincomycin Diarrhea and Nausea And Vomiting    (Not in a hospital admission)   Results for orders placed or performed during the hospital encounter of 12/23/18 (from the past 48 hour(s))  Basic metabolic panel     Status: Abnormal   Collection Time: 12/23/18  6:47 AM  Result Value Ref Range   Sodium 140 135 - 145 mmol/L   Potassium 4.3 3.5 - 5.1 mmol/L   Chloride 103 98 - 111 mmol/L   CO2 26 22 - 32 mmol/L   Glucose, Bld 182 (H) 70 - 99 mg/dL   BUN 15 8 - 23 mg/dL   Creatinine, Ser 0.94 0.61 - 1.24 mg/dL   Calcium 9.1 8.9 - 10.3 mg/dL   GFR calc non Af Amer >60 >60 mL/min   GFR calc Af Amer >60 >60 mL/min   Anion gap 11 5 - 15    Comment: Performed at [facility], [place] [address], [place] [address]  CBC     Status: None   Collection Time: 12/23/18  6:47 AM  Result Value Ref Range   WBC 7.0 4.0 - 10.5 K/uL   RBC 4.81 4.22 - 5.81 MIL/uL   Hemoglobin 13.4 13.0 - 17.0 g/dL   HCT 41.7 39.0 - 52.0 %   MCV 86.7 80.0 - 100.0 fL   MCH 27.9 26.0 - 34.0 pg   MCHC 32.1 30.0 - 36.0 g/dL   RDW 14.1 11.5 - 15.5 %   Platelets 171 150 - 400 K/uL   nRBC 0.0 0.0 - 0.2 %    Comment: Performed at [facility], [place] [address], [place], [place] [address]  I-stat troponin, ED     Status: Abnormal   Collection Time: 12/23/18  6:47 AM  Result Value Ref Range   Troponin i, poc 0.14 (HH) 0.00 - 0.08 ng/mL   Comment NOTIFIED PHYSICIAN    Comment 3            Comment: Due to the release kinetics of cTnI, a negative result within  the first hours of the onset of symptoms does not rule out myocardial infarction with certainty. If myocardial infarction is still suspected, repeat the test at appropriate intervals.    Dg Chest 2 View  Result Date: 12/23/2018 CLINICAL DATA:  LEFT-sided chest pain and burning sensation starting last night. History of diabetes, hypertension, coronary artery disease, stent placement. EXAM: CHEST - 2 VIEW COMPARISON:  Chest x-ray dated 10/31/2017. FINDINGS: Heart size and mediastinal contours are stable. Lungs are clear. No pleural effusion or pneumothorax seen. No acute or suspicious osseous finding. IMPRESSION: No active cardiopulmonary disease. No evidence of pneumonia or pulmonary edema. Electronically Signed   By: Franki Cabot M.D.   On: 12/23/2018 07:08    Review Of Systems Constitutional: No fever, chills, weight loss or gain. Eyes: No vision change, wears reading glasses. No discharge or pain. Ears: No hearing loss, No tinnitus. Respiratory: No asthma, COPD, pneumonias. Positive shortness of breath. No hemoptysis. Cardiovascular: Positive chest pain, palpitation, leg edema. Gastrointestinal: No nausea, vomiting, diarrhea, constipation. No GI bleed. No hepatitis. Genitourinary: No dysuria, hematuria, kidney stone. No incontinance. Neurological: No headache, stroke, seizures.  Psychiatry: No psych facility admission for anxiety, depression, suicide. No detox. Skin: No rash. Musculoskeletal: Positive joint pain, fibromyalgia. No neck pain, back pain. Lymphadenopathy: No lymphadenopathy. Hematology: No anemia or easy bruising.   Blood pressure 109/66, pulse 66, temperature 98 F (36.7 C), temperature source Oral, resp. rate 17, height 6\' 3"  (1.905 m), weight 111.1 kg, SpO2 94 %. Body mass index is 30.62 kg/m. General appearance: alert, cooperative, appears stated age and no distress Head: Normocephalic, atraumatic. Eyes: Brown eyes, pink conjunctiva, corneas clear. PERRL, EOM's  intact. Neck: No adenopathy, no carotid bruit, no JVD, supple, symmetrical, trachea midline and thyroid not enlarged. Resp: Clear to auscultation bilaterally. Cardio: Regular rate and rhythm, S1, S2 normal, II/VI systolic murmur, no click, rub or gallop GI: Soft, non-tender; bowel sounds normal; no organomegaly. Extremities: No edema, cyanosis or clubbing. Skin: Warm and dry.  Neurologic: Alert and oriented X 3, normal strength. Normal coordination.  Assessment/Plan Acute coronary syndrome Stent in OM 3 Hypertension Type 2 DM Paroxysmal atrial fibrillation, CHADSVASc score of 3 H/O tobacco use disorder  Admit IV heparin. IV NTG. Home medications. Cardiac cath soon.  Birdie Riddle, MD  12/23/2018, 9:25 AM

## 2018-12-23 NOTE — ED Triage Notes (Signed)
Pt reports chest burning that started at midnight. Pt reports a hx of GERD and states the burning got worse so he came in for evaluation.

## 2018-12-23 NOTE — ED Notes (Signed)
Ordered diet tray for pt  

## 2018-12-23 NOTE — ED Provider Notes (Signed)
[facility] EMERGENCY DEPARTMENT Provider Note   CSN: [id number] Arrival date & time: 12/23/18  0630     History   Chief Complaint Chief Complaint  Patient presents with  . Chest Pain    HPI DADRIAN Moody is a 63 y.o. male.  63 yo M with a chief complaint of chest pain.  This started about 7 hours ago.  He said he was studying when it started.  Nothing seems to make this better or worse.  Seems to come and go.  Denies shortness of breath denies diaphoresis nausea or vomiting.  Denies radiation of the pain.  Described as an ache to the left side of his chest.  Described as burning.  Has had pain like this previously but not as severe.  He has had a heart attack previously, does not think this feels similar.  The history is provided by the patient.  Chest Pain  Pain location:  L chest Pain quality: aching and burning   Pain radiates to:  Does not radiate Pain severity:  Severe Onset quality:  Gradual Duration:  7 hours Timing:  Constant Progression:  Worsening Chronicity:  New Relieved by:  Nothing Worsened by:  Nothing Ineffective treatments:  None tried Associated symptoms: no abdominal pain, no fever, no headache, no palpitations, no shortness of breath and no vomiting   Risk factors: coronary artery disease     Past Medical History:  Diagnosis Date  . Atrial arrhythmia   . CAP (community acquired pneumonia)   . Cellulitis of knee, right 06/06/12  . Chronic otitis externa 06/06/12   C Multiple trials with antibiocs and antifungal. Cultures were positive for Pseudomonas. Follow up with ENT on 06/14/12    . CORONARY ARTERY DISEASE 11/25/2006   Cardiac cath by Dr Terrence Dupont 11/2003 LV showed good LV systolic function, EF of 20-94%. Left main was patent. LAD has 20-30% mid stenosis. Diagonal 1 and diagonal 2 were patent. Left circumflex was patent. OM1 was less than 0.5 mm which was diffusely diseased. OM2 has 20% ostial stenosis which was patent which  was  moderate size. OM3 was patent at prior PTCA and stented site. RCA has 20-30% proximal   . Hypertension   . NSTEMI (non-ST elevated myocardial infarction) ([place]) 08/14/2012   S/p Successful PTCA to proximal OM-3     . TOBACCO ABUSE 11/25/2006  . Type II diabetes mellitus [facility])     Patient Active Problem List   Diagnosis Date Noted  . Acute coronary syndrome ([place]) 12/23/2018  . Seborrheic dermatitis 08/23/2017  . Peripheral neuropathy 08/22/2017  . Anemia 06/21/2017  . Abdominal discomfort 04/27/2017  . Need for vaccination with 13-polyvalent pneumococcal conjugate vaccine 12/03/2016  . Hematuria, gross 09/10/2016  . Chronic anticoagulation (Xarelto) 12/26/2015  . Right lumbar radiculitis 11/19/2014  . Healthcare maintenance 12/13/2013  . Dysphagia, unspecified(787.20) 05/10/2013  . Lumbosacral spondylosis without myelopathy 04/18/2013  . Lumbar facet arthropathy 04/18/2013  . Sacroiliac joint dysfunction of left side 02/21/2013  . Leg length discrepancy 02/21/2013  . Seborrhea capitis 01/23/2013  . NSTEMI (non-ST elevated myocardial infarction) ([place]) 08/14/2012  . Tobacco abuse 08/08/2012  . Hypertension 09/25/2011  . GERD 04/08/2010  . Chronic radicular low back pain 07/20/2007  . Diabetes mellitus type 2, controlled ([place]) 11/25/2006  . HYPOGONADISM 11/25/2006  . Coronary atherosclerosis 11/25/2006  . Atrial fibrillation ([place]) 11/25/2006    Past Surgical History:  Procedure Laterality Date  . CORONARY ANGIOPLASTY WITH STENT PLACEMENT  ~ 2005   "1"  .  CYSTECTOMY  1990's   LUA  . LEFT HEART CATHETERIZATION WITH CORONARY ANGIOGRAM N/A 08/15/2012   Procedure: LEFT HEART CATHETERIZATION WITH CORONARY ANGIOGRAM;  Surgeon: Clent Demark, MD;  Location: [facility] CATH LAB;  Service: Cardiovascular;  Laterality: N/A;  . PERCUTANEOUS CORONARY STENT INTERVENTION (PCI-S)  08/15/2012   Procedure: PERCUTANEOUS CORONARY STENT INTERVENTION (PCI-S);  Surgeon: Clent Demark, MD;  Location: [facility]  CATH LAB;  Service: Cardiovascular;;        Home Medications    Prior to Admission medications   Medication Sig Start Date End Date Taking? Authorizing Provider  empagliflozin (JARDIANCE) 10 MG TABS tablet Take 10 mg by mouth daily. 10/06/18  Yes Dorrell, [name], MD  gabapentin (NEURONTIN) 300 MG capsule Take 2 capsules (600 mg total) by mouth 3 (three) times daily. Patient taking differently: Take 600 mg by mouth 2 (two) times daily as needed (tingling in feet).  10/06/18  Yes Dorrell, [name], MD  glipiZIDE (GLUCOTROL) 10 MG tablet Take 1 tablet (10 mg total) by mouth daily. 08/24/18  Yes Hoffman, Jessica Ratliff, DO  isosorbide mononitrate (IMDUR) 30 MG 24 hr tablet Take 1 tablet (30 mg total) by mouth daily. 01/19/18  Yes Kalman Shan Ratliff, DO  metFORMIN (GLUCOPHAGE) 1000 MG tablet Take 1 tablet (1,000 mg total) by mouth 2 (two) times daily with a meal. 01/19/18  Yes Hoffman, Jessica Ratliff, DO  morphine (MSIR) 15 MG tablet Take 15 mg by mouth as needed. 12/21/18  Yes [provider]  nitroGLYCERIN (NITROSTAT) 0.4 MG SL tablet DISSOLVE ONE TABLET UNDER THE TONGUE EVERY 5 MINUTES AS NEEDED FOR CHEST PAIN.  DO NOT EXCEED A TOTAL OF 3 DOSES IN 15 MINUTES Patient taking differently: Place 0.4 mg under the tongue every 5 (five) minutes as needed for chest pain.  07/24/18  Yes Hoffman, Jessica Ratliff, DO  omeprazole (PRILOSEC) 20 MG capsule Take 1 capsule (20 mg total) by mouth daily. Patient taking differently: Take 20 mg by mouth as needed (heart burn).  10/06/18  Yes Dorrell, [name], MD  rivaroxaban (XARELTO) 20 MG TABS tablet Take 1 tablet (20 mg total) by mouth daily. 03/19/18  Yes Hoffman, Jessica Ratliff, DO  rosuvastatin (CRESTOR) 40 MG tablet Take 1 tablet (40 mg total) by mouth at bedtime. Patient taking differently: Take 40 mg by mouth daily.  01/22/18 01/22/19 Yes Hoffman, Jessica Ratliff, DO  sotalol (BETAPACE) 80 MG tablet Take 0.5 tablets (40 mg total) by mouth 2  (two) times daily. Patient taking differently: Take 40 mg by mouth daily.  08/27/18  Yes Hoffman, Jessica Ratliff, DO  promethazine (PHENERGAN) 12.5 MG tablet Take 1 tablet (12.5 mg total) by mouth every 6 (six) hours as needed for nausea or vomiting. 11/27/18   Alphonzo Grieve, MD    Family History Family History  Problem Relation Age of Onset  . Cancer Mother     Social History Social History   Tobacco Use  . Smoking status: Former Smoker    Packs/day: 0.20    Years: 12.00    Pack years: 2.40    Types: Cigarettes    Last attempt to quit: 06/29/2018    Years since quitting: 0.4  . Smokeless tobacco: Never Used  Substance Use Topics  . Alcohol use: Yes    Alcohol/week: 0.0 standard drinks    Comment: socially  . Drug use: No    Comment: former marijuana     Allergies   Clindamycin/lincomycin   Review of Systems Review of Systems  Constitutional: Negative for chills and fever.  HENT: Negative for congestion and facial swelling.   Eyes: Negative for discharge and visual disturbance.  Respiratory: Negative for shortness of breath.   Cardiovascular: Positive for chest pain. Negative for palpitations.  Gastrointestinal: Negative for abdominal pain, diarrhea and vomiting.  Musculoskeletal: Negative for arthralgias and myalgias.  Skin: Negative for color change and rash.  Neurological: Negative for tremors, syncope and headaches.  Psychiatric/Behavioral: Negative for confusion and dysphoric mood.     Physical Exam Updated Vital Signs BP 132/69   Pulse 76   Temp 98 F (36.7 C) (Oral)   Resp (!) 23   Ht 6\' 3"  (1.905 m)   Wt 111.1 kg   SpO2 98%   BMI 30.62 kg/m   Physical Exam Vitals signs and nursing note reviewed.  Constitutional:      Appearance: He is well-developed.  HENT:     Head: Normocephalic and atraumatic.  Eyes:     Pupils: Pupils are equal, round, and reactive to light.  Neck:     Musculoskeletal: Normal range of motion and neck supple.      Vascular: No JVD.  Cardiovascular:     Rate and Rhythm: Normal rate and regular rhythm.     Heart sounds: No murmur. No friction rub. No gallop.   Pulmonary:     Effort: No respiratory distress.     Breath sounds: No wheezing.  Abdominal:     General: There is no distension.     Tenderness: There is no guarding or rebound.  Musculoskeletal: Normal range of motion.     Right lower leg: Edema present.     Left lower leg: Edema present.     Comments: Trace edema to bilateral lower extremities  Skin:    Coloration: Skin is not pale.     Findings: No rash.  Neurological:     Mental Status: He is alert and oriented to person, place, and time.  Psychiatric:        Behavior: Behavior normal.      ED Treatments / Results  Labs (all labs ordered are listed, but only abnormal results are displayed) Labs Reviewed  BASIC METABOLIC PANEL - Abnormal; Notable for the following components:      Result Value   Glucose, Bld 182 (*)    All other components within normal limits  I-STAT TROPONIN, ED - Abnormal; Notable for the following components:   Troponin i, poc 0.14 (*)    All other components within normal limits  CBC  HEPARIN LEVEL (UNFRACTIONATED)  APTT  HIV ANTIBODY (ROUTINE TESTING W REFLEX)    EKG EKG Interpretation  Date/Time:  Saturday December 23 2018 07:37:52 EST Ventricular Rate:  70 PR Interval:  142 QRS Duration: 88 QT Interval:  384 QTC Calculation: 415 R Axis:   80 Text Interpretation:  Sinus rhythm Nonspecific T abnormalities, inferior leads Minimal ST elevation, anterior leads t waves now flipped in inferior leads with minimal depression Otherwise no significant change Confirmed by Deno Etienne [phone]) on 12/23/2018 7:42:35 AM Also confirmed by Deno Etienne [phone]), editor Radene Gunning [phone])  on 12/23/2018 11:49:58 AM   Radiology Dg Chest 2 View  Result Date: 12/23/2018 CLINICAL DATA:  LEFT-sided chest pain and burning sensation starting last night. History of  diabetes, hypertension, coronary artery disease, stent placement. EXAM: CHEST - 2 VIEW COMPARISON:  Chest x-ray dated 10/31/2017. FINDINGS: Heart size and mediastinal contours are stable. Lungs are clear. No pleural effusion or pneumothorax seen. No acute  or suspicious osseous finding. IMPRESSION: No active cardiopulmonary disease. No evidence of pneumonia or pulmonary edema. Electronically Signed   By: Franki Cabot M.D.   On: 12/23/2018 07:08    Procedures Procedures (including critical care time)  Medications Ordered in ED Medications  sodium chloride flush (NS) 0.9 % injection 3 mL (3 mLs Intravenous Not Given 12/23/18 0835)  nitroGLYCERIN (NITROSTAT) SL tablet 0.4 mg (has no administration in time range)  nitroGLYCERIN 50 mg in dextrose 5 % 250 mL (0.2 mg/mL) infusion (80 mcg/min Intravenous Rate/Dose Change 12/23/18 1453)  heparin ADULT infusion 100 units/mL (25000 units/233mL sodium chloride 0.45%) (1,500 Units/hr Intravenous New Bag/Given 12/23/18 0904)  aspirin chewable tablet 324 mg (324 mg Oral Not Given 12/23/18 1419)    Or  aspirin suppository 300 mg ( Rectal See Alternative 12/23/18 1419)  aspirin EC tablet 81 mg (has no administration in time range)  acetaminophen (TYLENOL) tablet 650 mg (has no administration in time range)  ondansetron (ZOFRAN) injection 4 mg (has no administration in time range)  glipiZIDE (GLUCOTROL) tablet 10 mg (has no administration in time range)  metFORMIN (GLUCOPHAGE) tablet 1,000 mg (has no administration in time range)  pantoprazole (PROTONIX) EC tablet 40 mg (has no administration in time range)  promethazine (PHENERGAN) tablet 12.5 mg (has no administration in time range)  rosuvastatin (CRESTOR) tablet 40 mg (has no administration in time range)  sotalol (BETAPACE) tablet 40 mg (has no administration in time range)  canagliflozin (INVOKANA) tablet 100 mg (has no administration in time range)  aspirin chewable tablet 324 mg (324 mg Oral Given 12/23/18  0734)  morphine 4 MG/ML injection 4 mg (4 mg Intravenous Given 12/23/18 0757)  ondansetron (ZOFRAN) injection 4 mg (4 mg Intravenous Given 12/23/18 0757)     Initial Impression / Assessment and Plan / ED Course  I have reviewed the triage vital signs and the nursing notes.  Pertinent labs & imaging results that were available during my care of the patient were reviewed by me and considered in my medical decision making (see chart for details).     63 yo M with a chief complaint of chest pain.  Atypical in nature the patient's troponin is positive at 0.14.  He is having active ongoing severe chest pain and EKG was repeated with subtle changes but no STEMI, will discuss with cardiology started on nitro and heparin drip.  CRITICAL CARE Performed by: Cecilio Asper   Total critical care time: 80 minutes  Critical care time was exclusive of separately billable procedures and treating other patients.  Critical care was necessary to treat or prevent imminent or life-threatening deterioration.  Critical care was time spent personally by me on the following activities: development of treatment plan with patient and/or surrogate as well as nursing, discussions with consultants, evaluation of patient's response to treatment, examination of patient, obtaining history from patient or surrogate, ordering and performing treatments and interventions, ordering and review of laboratory studies, ordering and review of radiographic studies, pulse oximetry and re-evaluation of patient's condition.  The patients results and plan were reviewed and discussed.   Any x-rays performed were independently reviewed by myself.   Differential diagnosis were considered with the presenting HPI.  Medications  sodium chloride flush (NS) 0.9 % injection 3 mL (3 mLs Intravenous Not Given 12/23/18 0835)  nitroGLYCERIN (NITROSTAT) SL tablet 0.4 mg (has no administration in time range)  nitroGLYCERIN 50 mg in dextrose  5 % 250 mL (0.2 mg/mL) infusion (80 mcg/min Intravenous Rate/Dose  Change 12/23/18 1453)  heparin ADULT infusion 100 units/mL (25000 units/29mL sodium chloride 0.45%) (1,500 Units/hr Intravenous New Bag/Given 12/23/18 0904)  aspirin chewable tablet 324 mg (324 mg Oral Not Given 12/23/18 1419)    Or  aspirin suppository 300 mg ( Rectal See Alternative 12/23/18 1419)  aspirin EC tablet 81 mg (has no administration in time range)  acetaminophen (TYLENOL) tablet 650 mg (has no administration in time range)  ondansetron (ZOFRAN) injection 4 mg (has no administration in time range)  glipiZIDE (GLUCOTROL) tablet 10 mg (has no administration in time range)  metFORMIN (GLUCOPHAGE) tablet 1,000 mg (has no administration in time range)  pantoprazole (PROTONIX) EC tablet 40 mg (has no administration in time range)  promethazine (PHENERGAN) tablet 12.5 mg (has no administration in time range)  rosuvastatin (CRESTOR) tablet 40 mg (has no administration in time range)  sotalol (BETAPACE) tablet 40 mg (has no administration in time range)  canagliflozin (INVOKANA) tablet 100 mg (has no administration in time range)  aspirin chewable tablet 324 mg (324 mg Oral Given 12/23/18 0734)  morphine 4 MG/ML injection 4 mg (4 mg Intravenous Given 12/23/18 0757)  ondansetron (ZOFRAN) injection 4 mg (4 mg Intravenous Given 12/23/18 0757)    Vitals:   12/23/18 1145 12/23/18 1425 12/23/18 1430 12/23/18 1445  BP: 122/65 (!) 118/57 (!) 115/59 132/69  Pulse: 65 70 63 76  Resp: 17 16 12  (!) 23  Temp:      TempSrc:      SpO2: 96% 96% 96% 98%  Weight:      Height:        Final diagnoses:  NSTEMI (non-ST elevated myocardial infarction) ([place])    Admission/ observation were discussed with the admitting physician, patient and/or family and they are comfortable with the plan.   Final Clinical Impressions(s) / ED Diagnoses   Final diagnoses:  NSTEMI (non-ST elevated myocardial infarction) [facility])    ED Discharge Orders     None       Deno Etienne, DO 12/23/18 1458

## 2018-12-23 NOTE — Progress Notes (Signed)
ANTICOAGULATION CONSULT NOTE  Pharmacy Consult:  Heparin Indication: chest pain/ACS  Allergies  Allergen Reactions  . Clindamycin/Lincomycin Diarrhea and Nausea And Vomiting    Patient Measurements: Height: 6\' 3"  (190.5 cm) Weight: 245 lb (111.1 kg) IBW/kg (Calculated) : 84.5 Heparin Dosing Weight: 107 kg  Vital Signs: Temp: 97.6 F (36.4 C) (01/25 1508) Temp Source: Oral (01/25 1508) BP: 101/63 (01/25 1656) Pulse Rate: 76 (01/25 1656)  Labs: Recent Labs    12/23/18 0647 12/23/18 1512  HGB 13.4  --   HCT 41.7  --   PLT 171  --   APTT  --  53*  HEPARINUNFRC  --  0.40  CREATININE 0.94  --     Estimated Creatinine Clearance: 109.6 mL/min (by C-G formula based on SCr of 0.94 mg/dL).   Medical History: Past Medical History:  Diagnosis Date  . Atrial arrhythmia   . CAP (community acquired pneumonia)   . Cellulitis of knee, right 06/06/12  . Chronic otitis externa 06/06/12   C Multiple trials with antibiocs and antifungal. Cultures were positive for Pseudomonas. Follow up with ENT on 06/14/12    . CORONARY ARTERY DISEASE 11/25/2006   Cardiac cath by Dr Terrence Dupont 11/2003 LV showed good LV systolic function, EF of 42-68%. Left main was patent. LAD has 20-30% mid stenosis. Diagonal 1 and diagonal 2 were patent. Left circumflex was patent. OM1 was less than 0.5 mm which was diffusely diseased. OM2 has 20% ostial stenosis which was patent which  was moderate size. OM3 was patent at prior PTCA and stented site. RCA has 20-30% proximal   . Hypertension   . NSTEMI (non-ST elevated myocardial infarction) ([place]) 08/14/2012   S/p Successful PTCA to proximal OM-3     . TOBACCO ABUSE 11/25/2006  . Type II diabetes mellitus ([place])      Assessment: 66 YOM with history of Afib on Xarelto PTA, last dose 1/24 around 0800 per patient.  He presented to the ED with chest pain and Pharmacy consulted to transition patient to IV heparin.  Baseline labs reviewed.  Heparin level 0.4 and aPTT 53  (heparin level does not correlate with aPTT due to home Xarelto); will increase heparin drip to achieve therapeutic aPTT until levels correlate.  Goal of Therapy:  Heparin level 0.3-0.7 units/ml  APTT 66 - 102 sec Monitor platelets by anticoagulation protocol: Yes   Plan:  Increase Heparin gtt at 1700 units/hr, no bolus Check 6 hr heparin level and aPTT Daily heparin level, aPTT and CBC  Alanda Slim, PharmD, [facility] Clinical Pharmacist Please see AMION for all Pharmacists' Contact Phone Numbers 12/23/2018, 5:25 PM

## 2018-12-23 NOTE — Progress Notes (Signed)
Pt stated he had left chest pain 6/10.  IV NTG titrated to 90 mcg.  EKG obtained and MD notified.  Orders to give 2 mg Morphine.  Medication administered with relief.  VSS.  Pt resting with wife at bedside.  Will continue to monitor pt closely.

## 2018-12-24 LAB — CBC
HCT: 36.3 % — ABNORMAL LOW (ref 39.0–52.0)
Hemoglobin: 11.7 g/dL — ABNORMAL LOW (ref 13.0–17.0)
MCH: 27.7 pg (ref 26.0–34.0)
MCHC: 32.2 g/dL (ref 30.0–36.0)
MCV: 85.8 fL (ref 80.0–100.0)
Platelets: 157 10*3/uL (ref 150–400)
RBC: 4.23 MIL/uL (ref 4.22–5.81)
RDW: 14 % (ref 11.5–15.5)
WBC: 6.7 10*3/uL (ref 4.0–10.5)
nRBC: 0 % (ref 0.0–0.2)

## 2018-12-24 LAB — GLUCOSE, CAPILLARY
Glucose-Capillary: 158 mg/dL — ABNORMAL HIGH (ref 70–99)
Glucose-Capillary: 172 mg/dL — ABNORMAL HIGH (ref 70–99)
Glucose-Capillary: 155 mg/dL — ABNORMAL HIGH (ref 70–99)
Glucose-Capillary: 141 mg/dL — ABNORMAL HIGH (ref 70–99)

## 2018-12-24 LAB — BASIC METABOLIC PANEL
Anion gap: 8 (ref 5–15)
BUN: 18 mg/dL (ref 8–23)
CO2: 29 mmol/L (ref 22–32)
CREATININE: 0.97 mg/dL (ref 0.61–1.24)
Calcium: 8.6 mg/dL — ABNORMAL LOW (ref 8.9–10.3)
Chloride: 103 mmol/L (ref 98–111)
GFR calc Af Amer: >60 mL/min (ref >60)
GFR calc non Af Amer: >60 mL/min (ref >60)
Glucose, Bld: 134 mg/dL — ABNORMAL HIGH (ref 70–99)
Potassium: 4.1 mmol/L (ref 3.5–5.1)
Sodium: 140 mmol/L (ref 135–145)

## 2018-12-24 LAB — LIPID PANEL
Cholesterol: 83 mg/dL (ref 0–200)
HDL: 34 mg/dL — ABNORMAL LOW (ref >40)
LDL Cholesterol: 40 mg/dL (ref 0–99)
Total CHOL/HDL Ratio: 2.4 RATIO
Triglycerides: 44 mg/dL (ref <150)
VLDL: 9 mg/dL (ref 0–40)

## 2018-12-24 LAB — PROTIME-INR
INR: 0.98
PROTHROMBIN TIME: 12.9 s (ref 11.4–15.2)

## 2018-12-24 LAB — APTT
aPTT: 65 seconds — ABNORMAL HIGH (ref 24–36)
aPTT: 64 seconds — ABNORMAL HIGH (ref 24–36)

## 2018-12-24 LAB — HEPARIN LEVEL (UNFRACTIONATED)
Heparin Unfractionated: 0.34 IU/mL (ref 0.30–0.70)
Heparin Unfractionated: 0.35 IU/mL (ref 0.30–0.70)
Heparin Unfractionated: 0.42 IU/mL (ref 0.30–0.70)

## 2018-12-24 LAB — TROPONIN I
Troponin I: 0.18 ng/mL (ref <0.03)
Troponin I: 0.16 ng/mL (ref <0.03)

## 2018-12-24 LAB — HIV ANTIBODY (ROUTINE TESTING W REFLEX): HIV SCREEN 4TH GENERATION: NONREACTIVE

## 2018-12-24 MED ORDER — SODIUM CHLORIDE 0.9 % IV SOLN
INTRAVENOUS | Status: DC
  Administered 2018-12-24 – 2018-12-25 (×2): via INTRAVENOUS

## 2018-12-24 MED ORDER — SODIUM CHLORIDE 0.9% FLUSH
3.0000 mL | Freq: Two times a day (BID) | INTRAVENOUS | Status: DC
  Administered 2018-12-25: 3 mL via INTRAVENOUS

## 2018-12-24 MED ORDER — INSULIN ASPART 100 UNIT/ML ~~LOC~~ SOLN
0.0000 [IU] | Freq: Three times a day (TID) | SUBCUTANEOUS | Status: DC
  Administered 2018-12-24 (×2): 3 [IU] via SUBCUTANEOUS
  Administered 2018-12-25: 2 [IU] via SUBCUTANEOUS
  Administered 2018-12-25: 3 [IU] via SUBCUTANEOUS
  Administered 2018-12-26: 2 [IU] via SUBCUTANEOUS

## 2018-12-24 MED ORDER — SODIUM CHLORIDE 0.9% FLUSH: 3.0000 mL | INTRAVENOUS | Status: DC | PRN

## 2018-12-24 MED ORDER — GABAPENTIN 300 MG PO CAPS
300.0000 mg | ORAL_CAPSULE | Freq: Three times a day (TID) | ORAL | Status: DC
  Administered 2018-12-24 – 2018-12-26 (×7): 300 mg via ORAL
  Filled 2018-12-24 (×7): qty 1

## 2018-12-24 MED ORDER — ALPRAZOLAM 0.25 MG PO TABS
0.2500 mg | ORAL_TABLET | Freq: Two times a day (BID) | ORAL | Status: DC | PRN
  Administered 2018-12-24 – 2018-12-26 (×3): 0.25 mg via ORAL
  Filled 2018-12-24 (×4): qty 1

## 2018-12-24 MED ORDER — ZOLPIDEM TARTRATE 5 MG PO TABS
5.0000 mg | ORAL_TABLET | Freq: Every evening | ORAL | Status: DC | PRN
  Administered 2018-12-24: 5 mg via ORAL
  Filled 2018-12-24 (×2): qty 1

## 2018-12-24 MED ORDER — SODIUM CHLORIDE 0.9 % IV SOLN: 250.0000 mL | INTRAVENOUS | Status: DC | PRN

## 2018-12-24 NOTE — Progress Notes (Signed)
Ref: Valinda Party, DO   Subjective:  Chest pain continues. Troponin I is minimally elevated at 0.18 and 0.16 ng.   Objective:  Vital Signs in the last 24 hours: Temp:  [97.4 F (36.3 C)-98.6 F (37 C)] 98.1 F (36.7 C) (01/26 0517) Pulse Rate:  [63-83] 70 (01/26 0517) Cardiac Rhythm: Normal sinus rhythm (01/26 0830) Resp:  [12-23] 20 (01/25 1508) BP: (101-132)/(57-75) 116/61 (01/26 0517) SpO2:  [96 %-99 %] 99 % (01/26 0517) Weight:  [112.8 kg] 112.8 kg (01/26 0517)  Physical Exam: BP Readings from Last 1 Encounters:  12/24/18 116/61     Wt Readings from Last 1 Encounters:  12/24/18 112.8 kg    Weight change: 1.633 kg Body mass index is 31.07 kg/m. HEENT: [place]/AT, Eyes-Brown, PERL, EOMI, Conjunctiva-Pink, Sclera-Non-icteric Neck: No JVD, No bruit, Trachea midline. Lungs:  Clear, Bilateral. Cardiac:  Regular rhythm, normal S1 and S2, no S3. II/VI systolic murmur. Abdomen:  Soft, non-tender. BS present. Extremities:  No edema present. No cyanosis. No clubbing. CNS: AxOx3, Cranial nerves grossly intact, moves all 4 extremities.  Skin: Warm and dry.   Intake/Output from previous day: 01/25 0701 - 01/26 0700 In: 1226 [P.O.:702; I.V.:524] Out: 3151 [Urine:1275]    Lab Results: BMET    Component Value Date/Time   NA 140 12/24/2018 0118   NA 140 12/23/2018 0647   NA 139 11/27/2018 1040   NA 142 08/24/2018 1559   NA 137 10/31/2017 2135   NA 145 (H) 05/16/2017 1545   K 4.1 12/24/2018 0118   K 4.3 12/23/2018 0647   K 4.2 11/27/2018 1040   CL 103 12/24/2018 0118   CL 103 12/23/2018 0647   CL 100 11/27/2018 1040   CO2 29 12/24/2018 0118   CO2 26 12/23/2018 0647   CO2 23 11/27/2018 1040   GLUCOSE 134 (H) 12/24/2018 0118   GLUCOSE 182 (H) 12/23/2018 0647   GLUCOSE 148 (H) 11/27/2018 1040   GLUCOSE 60 (L) 08/24/2018 1559   GLUCOSE 160 (H) 10/31/2017 2135   BUN 18 12/24/2018 0118   BUN 15 12/23/2018 0647   BUN 12 11/27/2018 1040   BUN 14 08/24/2018 1559    BUN 14 10/31/2017 2135   BUN 17 05/16/2017 1545   CREATININE 0.97 12/24/2018 0118   CREATININE 0.94 12/23/2018 0647   CREATININE 1.08 11/27/2018 1040   CREATININE 0.65 05/10/2013 1443   CREATININE 0.75 01/30/2013 1013   CREATININE 0.77 07/26/2011 1501   CALCIUM 8.6 (L) 12/24/2018 0118   CALCIUM 9.1 12/23/2018 0647   CALCIUM 9.5 11/27/2018 1040   GFRNONAA >60 12/24/2018 0118   GFRNONAA >60 12/23/2018 0647   GFRNONAA 74 11/27/2018 1040   GFRNONAA >89 05/10/2013 1443   GFRAA >60 12/24/2018 0118   GFRAA >60 12/23/2018 0647   GFRAA 85 11/27/2018 1040   GFRAA >89 05/10/2013 1443   CBC    Component Value Date/Time   WBC 6.7 12/24/2018 0118   RBC 4.23 12/24/2018 0118   HGB 11.7 (L) 12/24/2018 0118   HGB 15.9 11/27/2018 1040   HCT 36.3 (L) 12/24/2018 0118   HCT 47.2 11/27/2018 1040   PLT 157 12/24/2018 0118   PLT 183 11/27/2018 1040   MCV 85.8 12/24/2018 0118   MCV 85 11/27/2018 1040   MCH 27.7 12/24/2018 0118   MCHC 32.2 12/24/2018 0118   RDW 14.0 12/24/2018 0118   RDW 14.0 11/27/2018 1040   LYMPHSABS 2.0 11/27/2018 1040   MONOABS 0.3 04/09/2015 2156   EOSABS 0.1 11/27/2018 1040  BASOSABS 0.0 11/27/2018 1040   HEPATIC Function Panel Recent Labs    08/24/18 1559 11/27/18 1040  PROT 6.6 7.0   HEMOGLOBIN A1C No components found for: HGA1C,  MPG CARDIAC ENZYMES Lab Results  Component Value Date   CKTOTAL 43 (L) 08/27/2016   CKMB 3.3 08/14/2012   TROPONINI 0.16 (HH) 12/24/2018   TROPONINI 0.18 (HH) 12/24/2018   TROPONINI 0.16 (HH) 12/23/2018   BNP No results for input(s): PROBNP in the last 8760 hours. TSH No results for input(s): TSH in the last 8760 hours. CHOLESTEROL Recent Labs    08/24/18 1559 12/24/18 0339  CHOL 121 83    Scheduled Meds: . aspirin  324 mg Oral NOW   Or  . aspirin  300 mg Rectal NOW  . aspirin EC  81 mg Oral Daily  . canagliflozin  100 mg Oral QAC breakfast  . gabapentin  300 mg Oral TID  . glipiZIDE  10 mg Oral Daily  .  insulin aspart  0-15 Units Subcutaneous TID WC  . metFORMIN  1,000 mg Oral BID WC  . pantoprazole  40 mg Oral Daily  . rosuvastatin  40 mg Oral QHS  . sodium chloride flush  3 mL Intravenous Once  . sotalol  40 mg Oral Daily   Continuous Infusions: . heparin 1,900 Units/hr (12/24/18 0225)  . nitroGLYCERIN 100 mcg/min (12/24/18 0344)   PRN Meds:.acetaminophen, morphine injection, nitroGLYCERIN, ondansetron (ZOFRAN) IV, promethazine  Assessment/Plan: Acute coronary syndrome CAD Stent in OM 3 HTN Type 2 DM Paroxysmal atrial fibrillation H/O tobacco use disorder  Sliding scale glycemic control. Cardiac cath in AM.   LOS: 1 day    Dixie Dials  MD  12/24/2018, 11:05 AM

## 2018-12-24 NOTE — Progress Notes (Addendum)
ANTICOAGULATION CONSULT NOTE  Pharmacy Consult:  Heparin Indication: chest pain/ACS  Allergies  Allergen Reactions  . Clindamycin/Lincomycin Diarrhea and Nausea And Vomiting    Patient Measurements: Height: 6\' 3"  (190.5 cm) Weight: 248 lb 9.6 oz (112.8 kg) IBW/kg (Calculated) : 84.5 Heparin Dosing Weight: 107 kg  Vital Signs: Temp: 98.1 F (36.7 C) (01/26 0517) Temp Source: Oral (01/26 0517) BP: 116/61 (01/26 0517) Pulse Rate: 70 (01/26 0517)  Labs: Recent Labs    12/23/18 0647  12/23/18 1512 12/23/18 1948 12/24/18 0118 12/24/18 0339 12/24/18 0645  HGB 13.4  --   --   --  11.7*  --   --   HCT 41.7  --   --   --  36.3*  --   --   PLT 171  --   --   --  157  --   --   APTT  --   --  53*  --  65*  --  64*  LABPROT  --   --   --   --  12.9  --   --   INR  --   --   --   --  0.98  --   --   HEPARINUNFRC  --    < > 0.40  --  0.34 0.35 0.42  CREATININE 0.94  --   --   --  0.97  --   --   TROPONINI  --   --   --  0.16* 0.18*  --  0.16*   < > = values in this interval not displayed.    Estimated Creatinine Clearance: 107 mL/min (by C-G formula based on SCr of 0.97 mg/dL).   Medical History: Past Medical History:  Diagnosis Date  . Atrial arrhythmia   . CAP (community acquired pneumonia)   . Cellulitis of knee, right 06/06/12  . Chronic otitis externa 06/06/12   C Multiple trials with antibiocs and antifungal. Cultures were positive for Pseudomonas. Follow up with ENT on 06/14/12    . CORONARY ARTERY DISEASE 11/25/2006   Cardiac cath by Dr Terrence Dupont 11/2003 LV showed good LV systolic function, EF of 29-51%. Left main was patent. LAD has 20-30% mid stenosis. Diagonal 1 and diagonal 2 were patent. Left circumflex was patent. OM1 was less than 0.5 mm which was diffusely diseased. OM2 has 20% ostial stenosis which was patent which  was moderate size. OM3 was patent at prior PTCA and stented site. RCA has 20-30% proximal   . Hypertension   . NSTEMI (non-ST elevated myocardial  infarction) ([place]) 08/14/2012   S/p Successful PTCA to proximal OM-3     . TOBACCO ABUSE 11/25/2006  . Type II diabetes mellitus ([place])      Assessment: 40 YOM with history of Afib on Xarelto PTA, last dose 1/24 around 0800 per patient.  He presented to the ED with chest pain and Pharmacy consulted to transition patient to IV heparin.  Baseline labs reviewed.  Heparin level 0.42 and aPTT 64 (heparin level does not correlate with aPTT due to home Xarelto); will keep heparin drip at 1900 units/hr (heparin level drawn a little early, likely therapeutic) to achieve therapeutic aPTT until levels correlate.  Goal of Therapy:  Heparin level 0.3-0.7 units/ml  APTT 66 - 102 sec Monitor platelets by anticoagulation protocol: Yes   Plan:  Continue Heparin gtt at 1900 units/hr, no bolus Daily heparin level, aPTT and CBC   Alanda Slim, PharmD, [facility] Clinical Pharmacist Please see AMION for all  Pharmacists' Contact Phone Numbers 12/24/2018, 9:59 AM

## 2018-12-24 NOTE — Progress Notes (Signed)
ANTICOAGULATION CONSULT NOTE - Follow Up Consult  Pharmacy Consult for heparin Indication: ACS/PAF  Labs: Recent Labs    12/23/18 0647 12/23/18 1512 12/23/18 1948 12/24/18 0118  HGB 13.4  --   --  11.7*  HCT 41.7  --   --  36.3*  PLT 171  --   --  157  APTT  --  53*  --  65*  LABPROT  --   --   --  12.9  INR  --   --   --  0.98  HEPARINUNFRC  --  0.40  --  0.34  CREATININE 0.94  --   --   --   TROPONINI  --   --  0.16*  --     Assessment: 63yo male slightly subtherapeutic on heparin after rate increase; heparin level continues to drop as Xarelto clears; no gtt issues or signs of bleeding per RN.  Goal of Therapy:  Heparin level 0.3-0.7 units/ml aPTT 66-102 seconds   Plan:  Will increase heparin gtt by 2 units/kg/hr to 1900 units/hr and check level with next scheduled lab draw.    Wynona Neat, PharmD, BCPS  12/24/2018,2:08 AM

## 2018-12-25 ENCOUNTER — Inpatient Hospital Stay (HOSPITAL_COMMUNITY)
Admission: EM | Disposition: A | Discharge status: Home / Self Care | Payer: Self-pay | Source: Home / Self Care | Attending: Cardiovascular Disease

## 2018-12-25 ENCOUNTER — Encounter (HOSPITAL_COMMUNITY): Payer: Self-pay | Admitting: Cardiology

## 2018-12-25 ENCOUNTER — Other Ambulatory Visit (HOSPITAL_COMMUNITY): Payer: Medicare HMO

## 2018-12-25 DIAGNOSIS — I214 Non-ST elevation (NSTEMI) myocardial infarction: Principal | ICD-10-CM

## 2018-12-25 DIAGNOSIS — I2510 Atherosclerotic heart disease of native coronary artery without angina pectoris: Secondary | ICD-10-CM

## 2018-12-25 HISTORY — PX: CORONARY STENT INTERVENTION: CATH118234

## 2018-12-25 HISTORY — PX: LEFT HEART CATH AND CORONARY ANGIOGRAPHY: CATH118249

## 2018-12-25 LAB — GLUCOSE, CAPILLARY
GLUCOSE-CAPILLARY: 80 mg/dL (ref 70–99)
GLUCOSE-CAPILLARY: 111 mg/dL — AB (ref 70–99)
Glucose-Capillary: 151 mg/dL — ABNORMAL HIGH (ref 70–99)
Glucose-Capillary: 137 mg/dL — ABNORMAL HIGH (ref 70–99)

## 2018-12-25 LAB — CBC
HCT: 35.4 % — ABNORMAL LOW (ref 39.0–52.0)
Hemoglobin: 11.6 g/dL — ABNORMAL LOW (ref 13.0–17.0)
MCH: 28.4 pg (ref 26.0–34.0)
MCHC: 32.8 g/dL (ref 30.0–36.0)
MCV: 86.8 fL (ref 80.0–100.0)
Platelets: 140 10*3/uL — ABNORMAL LOW (ref 150–400)
RBC: 4.08 MIL/uL — AB (ref 4.22–5.81)
RDW: 14 % (ref 11.5–15.5)
WBC: 7.5 10*3/uL (ref 4.0–10.5)
nRBC: 0 % (ref 0.0–0.2)

## 2018-12-25 LAB — POCT ACTIVATED CLOTTING TIME: Activated Clotting Time: 329 seconds

## 2018-12-25 LAB — HEPARIN LEVEL (UNFRACTIONATED): Heparin Unfractionated: 0.36 IU/mL (ref 0.30–0.70)

## 2018-12-25 LAB — APTT: aPTT: 75 seconds — ABNORMAL HIGH (ref 24–36)

## 2018-12-25 SURGERY — LEFT HEART CATH AND CORONARY ANGIOGRAPHY
Anesthesia: LOCAL

## 2018-12-25 MED ORDER — SODIUM CHLORIDE 0.9 % IV SOLN
1.7500 mg/kg/h | INTRAVENOUS | Status: AC
  Administered 2018-12-25: 1.75 mg/kg/h via INTRAVENOUS
  Filled 2018-12-25 (×2): qty 250

## 2018-12-25 MED ORDER — FENTANYL CITRATE (PF) 100 MCG/2ML IJ SOLN
INTRAMUSCULAR | Status: DC | PRN
  Administered 2018-12-25: 25 ug via INTRAVENOUS

## 2018-12-25 MED ORDER — MIDAZOLAM HCL 2 MG/2ML IJ SOLN
INTRAMUSCULAR | Status: AC
  Filled 2018-12-25: qty 2

## 2018-12-25 MED ORDER — THE SENSUOUS HEART BOOK
Freq: Once | Status: AC
  Administered 2018-12-26: 01:00:00
  Filled 2018-12-25: qty 1

## 2018-12-25 MED ORDER — SODIUM CHLORIDE 0.9 % IV SOLN
INTRAVENOUS | Status: AC | PRN
  Administered 2018-12-25: 1.75 mg/kg/h via INTRAVENOUS

## 2018-12-25 MED ORDER — ANGIOPLASTY BOOK
Freq: Once | Status: AC
  Administered 2018-12-26: 01:00:00
  Filled 2018-12-25: qty 1

## 2018-12-25 MED ORDER — HEPARIN (PORCINE) IN NACL 1000-0.9 UT/500ML-% IV SOLN
INTRAVENOUS | Status: DC | PRN
  Administered 2018-12-25 (×2): 500 mL

## 2018-12-25 MED ORDER — SODIUM CHLORIDE 0.9% FLUSH: 3.0000 mL | INTRAVENOUS | Status: DC | PRN

## 2018-12-25 MED ORDER — CLOPIDOGREL BISULFATE 300 MG PO TABS
ORAL_TABLET | ORAL | Status: AC
  Filled 2018-12-25: qty 1

## 2018-12-25 MED ORDER — NITROGLYCERIN 0.4 MG SL SUBL
SUBLINGUAL_TABLET | SUBLINGUAL | Status: DC | PRN
  Administered 2018-12-25: .4 mg via SUBLINGUAL

## 2018-12-25 MED ORDER — HEPARIN (PORCINE) IN NACL 1000-0.9 UT/500ML-% IV SOLN
INTRAVENOUS | Status: AC
  Filled 2018-12-25: qty 500

## 2018-12-25 MED ORDER — MORPHINE SULFATE (PF) 2 MG/ML IV SOLN
2.0000 mg | INTRAVENOUS | Status: DC | PRN
  Administered 2018-12-26: 03:00:00 2 mg via INTRAVENOUS
  Filled 2018-12-25: qty 1

## 2018-12-25 MED ORDER — ACETAMINOPHEN 325 MG PO TABS: 650.0000 mg | ORAL_TABLET | ORAL | Status: DC | PRN

## 2018-12-25 MED ORDER — FENTANYL CITRATE (PF) 100 MCG/2ML IJ SOLN
INTRAMUSCULAR | Status: AC
  Filled 2018-12-25: qty 2

## 2018-12-25 MED ORDER — ONDANSETRON HCL 4 MG/2ML IJ SOLN: 4.0000 mg | Freq: Four times a day (QID) | INTRAMUSCULAR | Status: DC | PRN

## 2018-12-25 MED ORDER — HEART ATTACK BOUNCING BOOK
Freq: Once | Status: AC
  Administered 2018-12-26: 01:00:00
  Filled 2018-12-25: qty 1

## 2018-12-25 MED ORDER — IOHEXOL 350 MG/ML SOLN
INTRAVENOUS | Status: DC | PRN
  Administered 2018-12-25: 90 mL via INTRA_ARTERIAL
  Administered 2018-12-25: 70 mL via INTRA_ARTERIAL

## 2018-12-25 MED ORDER — SODIUM CHLORIDE 0.9% FLUSH
3.0000 mL | Freq: Two times a day (BID) | INTRAVENOUS | Status: DC
  Administered 2018-12-25: 23:00:00 3 mL via INTRAVENOUS

## 2018-12-25 MED ORDER — NITROGLYCERIN 0.4 MG SL SUBL
SUBLINGUAL_TABLET | SUBLINGUAL | Status: AC
  Filled 2018-12-25: qty 1

## 2018-12-25 MED ORDER — HYDRALAZINE HCL 20 MG/ML IJ SOLN: 5.0000 mg | INTRAMUSCULAR | Status: AC | PRN

## 2018-12-25 MED ORDER — ASPIRIN 81 MG PO CHEW: 81.0000 mg | CHEWABLE_TABLET | Freq: Every day | ORAL | Status: DC

## 2018-12-25 MED ORDER — CLOPIDOGREL BISULFATE 75 MG PO TABS: 75.0000 mg | ORAL_TABLET | Freq: Every day | ORAL | Status: DC

## 2018-12-25 MED ORDER — CLOPIDOGREL BISULFATE 75 MG PO TABS
75.0000 mg | ORAL_TABLET | Freq: Every day | ORAL | Status: DC
  Administered 2018-12-26: 75 mg via ORAL
  Filled 2018-12-25: qty 1

## 2018-12-25 MED ORDER — SODIUM CHLORIDE 0.9 % IV SOLN
INTRAVENOUS | Status: AC
  Administered 2018-12-25: 15:00:00 via INTRAVENOUS

## 2018-12-25 MED ORDER — LIDOCAINE HCL (PF) 1 % IJ SOLN
INTRAMUSCULAR | Status: DC | PRN
  Administered 2018-12-25: 30 mL

## 2018-12-25 MED ORDER — BIVALIRUDIN TRIFLUOROACETATE 250 MG IV SOLR
INTRAVENOUS | Status: AC
  Filled 2018-12-25: qty 250

## 2018-12-25 MED ORDER — LABETALOL HCL 5 MG/ML IV SOLN: 10.0000 mg | INTRAVENOUS | Status: AC | PRN

## 2018-12-25 MED ORDER — MIDAZOLAM HCL 2 MG/2ML IJ SOLN
INTRAMUSCULAR | Status: DC | PRN
  Administered 2018-12-25: 1 mg via INTRAVENOUS

## 2018-12-25 MED ORDER — NITROGLYCERIN 1 MG/10 ML FOR IR/CATH LAB
INTRA_ARTERIAL | Status: DC | PRN
  Administered 2018-12-25: 200 ug

## 2018-12-25 MED ORDER — NITROGLYCERIN 1 MG/10 ML FOR IR/CATH LAB
INTRA_ARTERIAL | Status: AC
  Filled 2018-12-25: qty 10

## 2018-12-25 MED ORDER — SODIUM CHLORIDE 0.9 % IV SOLN: 250.0000 mL | INTRAVENOUS | Status: DC | PRN

## 2018-12-25 MED ORDER — BIVALIRUDIN BOLUS VIA INFUSION - CUPID
INTRAVENOUS | Status: DC | PRN
  Administered 2018-12-25: 84.6 mg via INTRAVENOUS

## 2018-12-25 MED ORDER — LIDOCAINE HCL (PF) 1 % IJ SOLN
INTRAMUSCULAR | Status: AC
  Filled 2018-12-25: qty 30

## 2018-12-25 SURGICAL SUPPLY — 15 items
BALLN SAPPHIRE 2.0X12 (BALLOONS) ×2
BALLN SAPPHIRE ~~LOC~~ 3.25X12 (BALLOONS) ×2 IMPLANT
BALLOON SAPPHIRE 2.0X12 (BALLOONS) ×1 IMPLANT
CATH INFINITI 5FR MULTPACK ANG (CATHETERS) ×2 IMPLANT
CATH VISTA GUIDE 6FR XB3.5 (CATHETERS) ×2 IMPLANT
KIT ENCORE 26 ADVANTAGE (KITS) ×2 IMPLANT
KIT HEART LEFT (KITS) ×2 IMPLANT
PACK CARDIAC CATHETERIZATION (CUSTOM PROCEDURE TRAY) ×2 IMPLANT
SHEATH PINNACLE 5F 10CM (SHEATH) ×2 IMPLANT
SHEATH PINNACLE 6F 10CM (SHEATH) ×2 IMPLANT
STENT SYNERGY DES 3X16 (Permanent Stent) ×2 IMPLANT
SYR MEDRAD MARK 7 150ML (SYRINGE) ×2 IMPLANT
TRANSDUCER W/STOPCOCK (MISCELLANEOUS) ×2 IMPLANT
WIRE ASAHI PROWATER 180CM (WIRE) ×2 IMPLANT
WIRE EMERALD 3MM-J .035X150CM (WIRE) ×2 IMPLANT

## 2018-12-25 NOTE — Interval H&P Note (Signed)
Cath Lab Visit (complete for each Cath Lab visit)  Clinical Evaluation Leading to the Procedure:   ACS: Yes.    Non-ACS:    Anginal Classification: CCS III  Anti-ischemic medical therapy: Maximal Therapy (2 or more classes of medications)  Non-Invasive Test Results: No non-invasive testing performed  Prior CABG: No previous CABG      History and Physical Interval Note:  12/25/2018 11:23 AM  [name]  has presented today for surgery, with the diagnosis of unstable angina  The various methods of treatment have been discussed with the patient and family. After consideration of risks, benefits and other options for treatment, the patient has consented to  Procedure(s): LEFT HEART CATH AND CORONARY ANGIOGRAPHY (N/A) as a surgical intervention .  The patient's history has been reviewed, patient examined, no change in status, stable for surgery.  I have reviewed the patient's chart and labs.  Questions were answered to the patient's satisfaction.     Charolette Forward

## 2018-12-25 NOTE — Progress Notes (Signed)
Received [name] from cath lab to 6c06 s/p cath with stent placement by Dr Gwenlyn Found. Patient arrived with right groin sheath intact, site benign, and angiomax infusing. Patient oriented to room, assessment in progress.

## 2018-12-25 NOTE — Progress Notes (Signed)
  Echocardiogram 2D Echocardiogram was attempted but patient was in the cath lab.    Jennette Dubin 12/25/2018, 12:53 PM

## 2018-12-25 NOTE — Progress Notes (Signed)
Subjective:  Complaints of left-sided burning chest pain off and on.  Also complains of exertional pain associated with feeling tired and fatigued lately.  Denies shortness of breath.  Denies nausea, vomiting, diaphoresis.  Objective:  Vital Signs in the last 24 hours: Temp:  [97.4 F (36.3 C)-99 F (37.2 C)] 99 F (37.2 C) (01/27 0516) Pulse Rate:  [73-87] 87 (01/27 0516) BP: (104-143)/(65-70) 143/65 (01/27 0516) SpO2:  [97 %-99 %] 97 % (01/27 0516) Weight:  [112.8 kg] 112.8 kg (01/27 0516)  Intake/Output from previous day: 01/26 0701 - 01/27 0700 In: 1709.8 [P.O.:720; I.V.:989.8] Out: 1325 [Urine:1325] Intake/Output from this shift: No intake/output data recorded.  Physical Exam: Neck: no adenopathy, no carotid bruit, no JVD and supple, symmetrical, trachea midline Lungs: clear to auscultation bilaterally Heart: regular rate and rhythm, S1, S2 normal and 2/6 systolic murmur noted Abdomen: soft, non-tender; bowel sounds normal; no masses,  no organomegaly Extremities: extremities normal, atraumatic, no cyanosis or edema  Lab Results: Recent Labs    12/24/18 0118 12/25/18 0612  WBC 6.7 7.5  HGB 11.7* 11.6*  PLT 157 140*   Recent Labs    12/23/18 0647 12/24/18 0118  NA 140 140  K 4.3 4.1  CL 103 103  CO2 26 29  GLUCOSE 182* 134*  BUN 15 18  CREATININE 0.94 0.97   Recent Labs    12/24/18 0118 12/24/18 0645  TROPONINI 0.18* 0.16*   Hepatic Function Panel No results for input(s): PROT, ALBUMIN, AST, ALT, ALKPHOS, BILITOT, BILIDIR, IBILI in the last 72 hours. Recent Labs    12/24/18 0339  CHOL 83   No results for input(s): PROTIME in the last 72 hours.  Imaging: Imaging results have been reviewed and No results found.  Cardiac Studies:  Assessment/Plan:  Acute coronary syndrome CAD Stent in OM 3 HTN Type 2 DM Paroxysmal atrial fibrillation H/O tobacco use disorder Plan Discussed with patient and his wife at length regarding left cardiac  catheterization, possible PTCA stenting  Its  risk and benefits, I.e., death, MI, stroke, need for emergency CABG, local vascular complications, etc., and consents for PCI  LOS: 2 days    Charolette Forward 12/25/2018, 8:39 AM

## 2018-12-25 NOTE — Progress Notes (Signed)
Entered patient's room to check on vascular site, right groin sheath. Upon entering room, patient HOB elevated greater than 30 degrees and patient eating burger from local fast food restaurant, which he states was given to him by his nephew. Patient stated he was not aware that he was to remain NPO and supine while sheath present, though we have discussed this multiple times. Patient's wife was sitting in recliner in room and nodded and verbally agreed that this was her understanding as well, but did not intervene or intercept and did not notify staff. I instructed patient on post cath instructions and had patient to explain to me in his words his understanding of not eating, supine position, etc. Patient correctly verbalizes understanding of instructions at this time.

## 2018-12-25 NOTE — H&P (View-Only) (Signed)
Subjective:  Complaints of left-sided burning chest pain off and on.  Also complains of exertional pain associated with feeling tired and fatigued lately.  Denies shortness of breath.  Denies nausea, vomiting, diaphoresis.  Objective:  Vital Signs in the last 24 hours: Temp:  [97.4 F (36.3 C)-99 F (37.2 C)] 99 F (37.2 C) (01/27 0516) Pulse Rate:  [73-87] 87 (01/27 0516) BP: (104-143)/(65-70) 143/65 (01/27 0516) SpO2:  [97 %-99 %] 97 % (01/27 0516) Weight:  [112.8 kg] 112.8 kg (01/27 0516)  Intake/Output from previous day: 01/26 0701 - 01/27 0700 In: 1709.8 [P.O.:720; I.V.:989.8] Out: 1325 [Urine:1325] Intake/Output from this shift: No intake/output data recorded.  Physical Exam: Neck: no adenopathy, no carotid bruit, no JVD and supple, symmetrical, trachea midline Lungs: clear to auscultation bilaterally Heart: regular rate and rhythm, S1, S2 normal and 2/6 systolic murmur noted Abdomen: soft, non-tender; bowel sounds normal; no masses,  no organomegaly Extremities: extremities normal, atraumatic, no cyanosis or edema  Lab Results: Recent Labs    12/24/18 0118 12/25/18 0612  WBC 6.7 7.5  HGB 11.7* 11.6*  PLT 157 140*   Recent Labs    12/23/18 0647 12/24/18 0118  NA 140 140  K 4.3 4.1  CL 103 103  CO2 26 29  GLUCOSE 182* 134*  BUN 15 18  CREATININE 0.94 0.97   Recent Labs    12/24/18 0118 12/24/18 0645  TROPONINI 0.18* 0.16*   Hepatic Function Panel No results for input(s): PROT, ALBUMIN, AST, ALT, ALKPHOS, BILITOT, BILIDIR, IBILI in the last 72 hours. Recent Labs    12/24/18 0339  CHOL 83   No results for input(s): PROTIME in the last 72 hours.  Imaging: Imaging results have been reviewed and No results found.  Cardiac Studies:  Assessment/Plan:  Acute coronary syndrome CAD Stent in OM 3 HTN Type 2 DM Paroxysmal atrial fibrillation H/O tobacco use disorder Plan Discussed with patient and his wife at length regarding left cardiac  catheterization, possible PTCA stenting  Its  risk and benefits, I.e., death, MI, stroke, need for emergency CABG, local vascular complications, etc., and consents for PCI  LOS: 2 days    Charolette Forward 12/25/2018, 8:39 AM

## 2018-12-26 ENCOUNTER — Inpatient Hospital Stay (HOSPITAL_COMMUNITY): Payer: Medicare HMO

## 2018-12-26 LAB — BASIC METABOLIC PANEL
Anion gap: 6 (ref 5–15)
BUN: 11 mg/dL (ref 8–23)
CHLORIDE: 107 mmol/L (ref 98–111)
CO2: 27 mmol/L (ref 22–32)
Calcium: 9 mg/dL (ref 8.9–10.3)
Creatinine, Ser: 0.8 mg/dL (ref 0.61–1.24)
GFR calc non Af Amer: >60 mL/min (ref >60)
Glucose, Bld: 158 mg/dL — ABNORMAL HIGH (ref 70–99)
Potassium: 4 mmol/L (ref 3.5–5.1)
Sodium: 140 mmol/L (ref 135–145)

## 2018-12-26 LAB — CBC
HEMATOCRIT: 40.1 % (ref 39.0–52.0)
HEMOGLOBIN: 13.4 g/dL (ref 13.0–17.0)
MCH: 28.6 pg (ref 26.0–34.0)
MCHC: 33.4 g/dL (ref 30.0–36.0)
MCV: 85.7 fL (ref 80.0–100.0)
Platelets: 153 10*3/uL (ref 150–400)
RBC: 4.68 MIL/uL (ref 4.22–5.81)
RDW: 13.9 % (ref 11.5–15.5)
WBC: 7.8 10*3/uL (ref 4.0–10.5)
nRBC: 0 % (ref 0.0–0.2)

## 2018-12-26 LAB — GLUCOSE, CAPILLARY: GLUCOSE-CAPILLARY: 138 mg/dL — AB (ref 70–99)

## 2018-12-26 LAB — TROPONIN I: Troponin I: 0.08 ng/mL (ref <0.03)

## 2018-12-26 MED ORDER — ALUM & MAG HYDROXIDE-SIMETH 200-200-20 MG/5ML PO SUSP
30.0000 mL | Freq: Four times a day (QID) | ORAL | Status: DC | PRN
  Administered 2018-12-26 (×2): 30 mL via ORAL
  Filled 2018-12-26 (×2): qty 30

## 2018-12-26 MED ORDER — PANTOPRAZOLE SODIUM 40 MG PO TBEC: 40.0000 mg | DELAYED_RELEASE_TABLET | Freq: Every day | ORAL | 3 refills | Status: DC

## 2018-12-26 MED ORDER — ALUM & MAG HYDROXIDE-SIMETH 200-200-20 MG/5ML PO SUSP: 30.0000 mL | Freq: Four times a day (QID) | ORAL | 0 refills | Status: DC | PRN

## 2018-12-26 MED ORDER — ASPIRIN 81 MG PO TBEC: 81.0000 mg | DELAYED_RELEASE_TABLET | Freq: Every day | ORAL | 3 refills | Status: AC

## 2018-12-26 MED ORDER — CLOPIDOGREL BISULFATE 75 MG PO TABS: 75.0000 mg | ORAL_TABLET | Freq: Every day | ORAL | 11 refills | Status: AC

## 2018-12-26 MED ORDER — METFORMIN HCL 1000 MG PO TABS: 1000.0000 mg | ORAL_TABLET | Freq: Two times a day (BID) | ORAL | 3 refills | Status: DC

## 2018-12-26 MED ORDER — MAGNESIUM HYDROXIDE 400 MG/5ML PO SUSP
30.0000 mL | Freq: Every day | ORAL | Status: DC | PRN
  Administered 2018-12-26: 30 mL via ORAL
  Filled 2018-12-26: qty 30

## 2018-12-26 MED FILL — metFORMIN HCL 1000 MG TABS: 1000 | 30 days supply | Qty: 60 | Fill #0

## 2018-12-26 MED FILL — ASPIRIN LOW DOSE 81 MG TBEC: 81 | 90 days supply | Qty: 90 | Fill #0

## 2018-12-26 MED FILL — PANTOPRAZOLE SOD DR 40 MG T: 40 | 30 days supply | Qty: 30 | Fill #0

## 2018-12-26 MED FILL — CLOPIDOGREL 75 MG TABLET: 75 | 30 days supply | Qty: 30 | Fill #0

## 2018-12-26 MED FILL — Clopidogrel Bisulfate Tab 300 MG (Base Equiv): ORAL | Qty: 2 | Status: AC

## 2018-12-26 NOTE — Progress Notes (Signed)
Pt.c/o of heartburn @0133 ;Maalox 30cc po given with slight relief.Around 0240,pt.c/o upper chest  pain which   Pt.describe  as burning pain.EKG done without any changes noted. Morphine 2 mg IV given as prn dose. Pt.was able to sleep at this time.Around 0500 ,pt.c/o again of upper chest burning pain. V/S stable. NTG 0.4mg  sl given x3 doses without relief. Dr.Harwani notified.Protonix po given as ordered. Stat troponin ordered.Currently pt.is chest pain free.Will continue to monitor pt.

## 2018-12-26 NOTE — Progress Notes (Signed)
  Echocardiogram 2D Echocardiogram has been performed.  Darlina Sicilian M 12/26/2018, 9:48 AM

## 2018-12-26 NOTE — Discharge Summary (Signed)
Discharge summary dictated on 12/26/2018 dictation number is [phone]

## 2018-12-26 NOTE — Progress Notes (Signed)
Site area: right groin  Site Prior to Removal:  Level 0  Pressure Applied For 20 MINUTES    Minutes Beginning at 2029  Manual:   Yes.    Patient Status During Pull:  WNL   Post Pull Groin Site:  Level 0  Post Pull Instructions Given:  Yes.    Post Pull Pulses Present:  Yes.    Dressing Applied:  Yes.    Comments:  Pt.tolerated procedure well

## 2018-12-26 NOTE — Progress Notes (Signed)
CARDIAC REHAB PHASE I   PRE:  Rate/Rhythm: 76 SR  BP:  Supine: 147/86  Sitting:   Standing:    SaO2:   MODE:  Ambulation: 800 ft   POST:  Rate/Rhythm: 90 SR  BP:  Supine:   Sitting: 155/83  Standing:    SaO2: 98%RA 0815-0910 Pt walked 800 ft on RA with steady gait. Did c/o chest burning from 5.5 to 7/10 before and after walk. Also when pt eating, he was observed grimacing . Stated food made burning worse. Dr Terrence Dupont made aware when he came to see pt.  MI  Education completed with pt and wife who voiced understanding. Stressed importance of plavix with stent. Reviewed MI restrictions, counting carbs and heart healthy food choices, ex ed , NTG use and CRP 2. Referred to [facility] program. Pt had not stopped smoking but stated he will now. Gave smoking cessation handout and encouraged him to call [phone].    Graylon Good, RN BSN  12/26/2018 9:06 AM

## 2018-12-26 NOTE — Care Management Important Message (Signed)
Important Message  Patient Details  Name: [name] MRN: [id number] Date of Birth: [birth year]   Medicare Important Message Given:  Yes    Tommy Medal 12/26/2018, 1:34 PM

## 2018-12-26 NOTE — Discharge Instructions (Signed)
Acute Coronary Syndrome    Acute coronary syndrome (ACS) is a serious problem in which there is suddenly not enough blood and oxygen reaching the heart. ACS can result in chest pain or a heart attack.  This condition is a medical emergency. If you have any symptoms of this condition, get help right away.  What are the causes?  This condition may be caused by:   Buildup of fat and cholesterol inside of the arteries (atherosclerosis). This is the most common cause. The buildup (plaque) can cause blood vessels in the heart (coronary arteries) to become narrow or blocked, which reduces blood flow to the heart. Plaque can also break off and lead to a clot, which can block an artery and cause a heart attack or stroke.   Sudden tightening of the muscles around the coronary arteries (coronary spasm).   Tearing of a coronary artery (spontaneous coronary artery dissection).   Very low blood pressure (hypotension).   An abnormal heartbeat (arrhythmia).   Other medical conditions that cause a decrease of oxygen to the heart, such as anemiaorrespiratory failure.   Using cocaine or methamphetamine.  What increases the risk?  The following factors may make you more likely to develop this condition:   Age. The risk for ACS increases as you get older.   History of chest pain, heart attack, peripheral artery disease, or stroke.   Having taken chemotherapy or immune-suppressing medicines.   Being male.   Family history of chest pain, heart disease, or stroke.   Smoking.   Not exercising enough.   Being overweight.   High cholesterol.   High blood pressure (hypertension).   Diabetes.   Excessive alcohol use.  What are the signs or symptoms?  Common symptoms of this condition include:   Chest pain. The pain may last a long time, or it may stop and come back (recur). It may feel like:  ? Crushing or squeezing.  ? Tightness, pressure, fullness, or heaviness.   Arm, neck, jaw, or back pain.   Heartburn or  indigestion.   Shortness of breath.   Nausea.   Sudden cold sweats.   Light-headedness.   Dizziness, or passing out.   Tiredness (fatigue).  Sometimes there are no symptoms.  How is this diagnosed?  This condition may be diagnosed based on:   Your medical history and symptoms.   An electrocardiogram (ECG). This imaging test measures the heart's electrical activity.   Blood tests. Cardiac blood tests may need to be repeated at designated time intervals.   Chest X-ray.   A CT scan of the chest.   A coronary angiogram. This is a procedure in which dye is injected into the bloodstream and then X-rays are taken to show if there is a blockage in a coronary artery.   Exercise stress testing.   Echocardiography. This is a test that uses sound waves to produce detailed images of the heart.  How is this treated?  The treatment is to restore blood flow to the heart as soon as possible. Treatment for this condition may include:   Oxygen therapy.   Medicines, such as:  ? Antiplatelet medicines and blood-thinning medicines, such as aspirin. These help prevent blood clots.  ? Medicine that dissolves any blood clots (fibrinolytic therapy).  ? Blood pressure medicines.  ? Nitroglycerin. This helps relieve chest pain and widens blood vessels to improve blood flow.  ? Pain medicine.  ? Cholesterol-lowering medicine.   Surgery, such as:  ? Coronary angioplasty with   stent placement. This involves placing a small piece of metal that looks like mesh or a spring into a narrow coronary artery. This widens the artery and keep it open.  ? Coronary artery bypass surgery. This involves taking a section of a blood vessel from a different part of your body, and placing it on the blocked coronary artery to allow blood to flow around (bypass) the blockage.   Cardiac rehabilitation. This is a program that helps improve your health and well-being. It includes exercise training, education, and counseling to help you recover.  Follow  these instructions at home:  Eating and drinking   Eat a heart-healthy diet that includes whole grains, fruits and vegetables, lean proteins, and low-fat or nonfat dairy products.   Limit how much salt (sodium) you eat as told by your health care provider. Follow instructions from your health care provider about any other eating or drinking restrictions, such as limiting foods that are high in fat and processed sugars.   Use healthy cooking methods such as roasting, grilling, broiling, baking, poaching, steaming, or stir-frying.   Talk with a dietitian to learn about healthy cooking methods and how to eat less sodium.  Medicines   Take over-the-counter and prescription medicines only as told by your health care provider.   Do not take these medicines unless your health care provider approves:  ? Vitamin supplements that contain vitamin A or vitamin E.  ? Nonsteroidal anti-inflammatory drugs (NSAIDs), such as ibuprofen, naproxen, or celecoxib.  ? Hormone replacement therapy that contains estrogen.  If you are taking blood thinners:   Talk with your health care provider before you take any medicines that contain aspirin or NSAIDs. These medicines increase your risk for dangerous bleeding.   Take your medicine exactly as told, at the same time every day.   Avoid activities that could cause injury or bruising, and follow instructions about how to prevent falls.   Wear a medical alert bracelet, and carry a card that lists what medicines you take.  Activity   Join a cardiac rehabilitation program. An exercise plan will be developed for you.   Ask your health care provider:  ? What activities and exercises are safe for you.  ? If you should follow specific instructions about lifting, driving, or climbing stairs.  Lifestyle   Do not use any products that contain nicotine or tobacco, such as cigarettes and e-cigarettes. If you need help quitting, ask your health care provider.   If your health care provider  says that alcohol is safe for you, limit your alcohol intake to no more than 1 drink a day. One drink equals 12 oz of beer, 5 oz of wine, or 1 oz of hard liquor.   Maintain a healthy weight. If you need to lose weight, work with your health care provider to do so safely.  General instructions   Tell all the health care providers who care for you about your heart condition, including your dentist. This may affect the medicines or treatment you receive.   Manage any other health conditions you have, such as hypertension or diabetes. These conditions affect your heart.   Learn ways to manage stress.   Get screened for depression, and get mental health treatment if you need it. People with ACS are at higher risk for depression.   Keep your vaccinations up to date. Get the flu shot (influenza vaccine) every year.   If directed, monitor your blood pressure at home.   Keep all   Fatigue. ? Nervousness or anxiety. ? Weakness. ? Diarrhea. ? Dark stools or blood in your stool.  You have sudden light-headedness or dizziness.  Your blood pressure is higher than 180/120.  You faint.  You have thoughts about hurting yourself. These symptoms may represent a serious problem that is an emergency. Do not wait to see if the symptoms will go away. Get medical help right away. Call your local emergency services (911 in the [place] Do not drive yourself to the  hospital. If you ever feel like you may hurt yourself or others, or have thoughts about taking your own life, get help right away. You can go to your nearest emergency department or call:  Emergency services (911 in the [place]  A suicide crisis helpline, such as the [facility] at 7278567796. This is open 24 hours a day. Summary  Acute coronary syndrome (ACS) is when there is not enough blood and oxygen being supplied to the heart. ACS can result in chest pain or a heart attack.  Acute coronary syndrome is a medical emergency. If you have any symptoms of this condition, get help right away.  Treatment includes medicines and procedures to open the blocked arteries and restore blood flow. This information is not intended to replace advice given to you by your health care provider. Make sure you discuss any questions you have with your health care provider. Document Released: 11/15/2005 Document Revised: 07/26/2017 Document Reviewed: 07/26/2017 Elsevier Interactive Patient Education  2019 [employer].  Coronary Angiogram With Stent Coronary angiogram with stent placement is a procedure to widen or open a narrow blood vessel of the heart (coronary artery). Arteries may become blocked by cholesterol buildup (plaques) in the lining of the wall. When a coronary artery becomes partially blocked, blood flow to that area decreases. This may lead to chest pain or a heart attack (myocardial infarction). A stent is a small piece of metal that looks like mesh or a spring. Stent placement may be done as treatment for a heart attack or right after a coronary angiogram in which a blocked artery is found. Let your health care provider know about:  Any allergies you have.  All medicines you are taking, including vitamins, herbs, eye drops, creams, and over-the-counter medicines.  Any problems you or family members have had with anesthetic medicines.  Any blood disorders you  have.  Any surgeries you have had.  Any medical conditions you have.  Whether you are pregnant or may be pregnant. What are the risks? Generally, this is a safe procedure. However, problems may occur, including:  Damage to the heart or its blood vessels.  A return of blockage.  Bleeding, infection, or bruising at the insertion site.  A collection of blood under the skin (hematoma) at the insertion site.  A blood clot in another part of the body.  Kidney injury.  Allergic reaction to the dye or contrast that is used.  Bleeding into the abdomen (retroperitoneal bleeding). What happens before the procedure? Staying hydrated Follow instructions from your health care provider about hydration, which may include:  Up to 2 hours before the procedure - you may continue to drink clear liquids, such as water, clear fruit juice, black coffee, and plain tea.  Eating and drinking restrictions Follow instructions from your health care provider about eating and drinking, which may include:  8 hours before the procedure - stop eating heavy meals or foods such as meat, fried foods, or fatty foods.  6 hours  before the procedure - stop eating light meals or foods, such as toast or cereal.  2 hours before the procedure - stop drinking clear liquids. Ask your health care provider about:  Changing or stopping your regular medicines. This is especially important if you are taking diabetes medicines or blood thinners.  Taking medicines such as ibuprofen. These medicines can thin your blood. Do not take these medicines before your procedure if your health care provider instructs you not to. Generally, aspirin is recommended before a procedure of passing a small, thin tube (catheter) through a blood vessel and into the heart (cardiac catheterization). What happens during the procedure?   An IV tube will be inserted into one of your veins.  You will be given one or more of the following: ? A  medicine to help you relax (sedative). ? A medicine to numb the area where the catheter will be inserted into an artery (local anesthetic).  To reduce your risk of infection: ? Your health care team will wash or sanitize their hands. ? Your skin will be washed with soap. ? Hair may be removed from the area where the catheter will be inserted.  Using a guide wire, the catheter will be inserted into an artery. The location may be in your groin, in your wrist, or in the fold of your arm (near your elbow).  A type of X-ray (fluoroscopy) will be used to help guide the catheter to the opening of the arteries in the heart.  A dye will be injected into the catheter, and X-rays will be taken. The dye will help to show where any narrowing or blockages are located in the arteries.  A tiny wire will be guided to the blocked spot, and a balloon will be inflated to make the artery wider.  The stent will be expanded and will crush the plaques into the wall of the vessel. The stent will hold the area open and improve the blood flow. Most stents have a drug coating to reduce the risk of the stent narrowing over time.  The artery may be made wider using a drill, laser, or other tools to remove plaques.  When the blood flow is better, the catheter will be removed. The lining of the artery will grow over the stent, which stays where it was placed. This procedure may vary among health care providers and hospitals. What happens after the procedure?  If the procedure is done through the leg, you will be kept in bed lying flat for about 6 hours. You will be instructed to not bend and not cross your legs.  The insertion site will be checked frequently.  The pulse in your foot or wrist will be checked frequently.  You may have additional blood tests, X-rays, and a test that records the electrical activity of your heart (electrocardiogram, or ECG). This information is not intended to replace advice given to you  by your health care provider. Make sure you discuss any questions you have with your health care provider. Document Released: 05/22/2003 Document Revised: 02/24/2018 Document Reviewed: 06/20/2016 Elsevier Interactive Patient Education  2019 [employer].

## 2018-12-26 NOTE — Consult Note (Signed)
            [facility] CM Primary Care Navigator  12/26/2018  [name] March 01, 1956 [id number]   Went to seepatient at the bedside to identify possible discharge needs buthewasalready dischargedhomeper staff. (with referral to cardiac rehab)  Per MD note,patient was admitted for on and off left-sided non-radiating chest pain . (unstable angina, acute coronary syndrome status post cardiac cath and coronary angiography)  Patient has discharge instruction tofollow-up withcardiology in 1 week post discharge.  Primary care provider's office is listed as providing transition of care (TOC).  Provider from primary care physician's office had followed patient during this admission.    For additional questions please contact:  Edwena Felty A. Henretter Piekarski, BSN, RN-BC [facility] PRIMARY CARE Navigator Cell: [phone]

## 2018-12-26 NOTE — Discharge Summary (Signed)
NAME: [name], [place] MEDICAL RECORD [id number] ACCOUNT [id number] DATE OF BIRTH:07-31-1956 FACILITY: [facility] LOCATION: [facility] PHYSICIAN:Zeidy Tayag Daivd Council, MD  DISCHARGE SUMMARY  DATE OF DISCHARGE:  12/26/2018  ADMITTING DIAGNOSES: 1.  Acute coronary syndrome, coronary artery disease, history of percutaneous coronary intervention to obtuse marginal 3 in the remote past. 2.  Hypertension. 3.  Diabetes mellitus. 4.  Paroxysmal atrial fibrillation, CHADS-VASc score of 3. 5.  History of tobacco use disorder.  DISCHARGE DIAGNOSES: 1.  Acute coronary syndrome, status post left cardiac catheterization followed by percutaneous transluminal coronary angioplasty/stenting to mid left circumflex. 2.  Coronary artery disease, history of non-Q-wave myocardial infarction in the past, status post percutaneous coronary intervention to obtuse marginal 3 in remote past. 3.  Hypertension. 4.  Type 2 diabetes mellitus. 5.  Paroxysmal atrial fibrillation, CHADS-VASc score of 3. 6.  Hyperlipidemia. 7.  History of tobacco abuse.  DISCHARGE HOME MEDICATIONS: 1.  Maalox 30 mL every 6 hours as needed for heartburn. 2.  Aspirin 81 mg 1 tablet daily. 3.  Clopidogrel 75 mg daily. 4.  Pantoprazole 40 mg daily. 5.  Jardiance 10 mg daily. 6.  Glipizide 10 mg daily. 7.  Isosorbide mononitrate 30 mg daily. 8.  Phenergan 12.5 mg every 6 hours as needed. 9. Xarelto 20 mg 1 tablet daily. 10.  Gabapentin 600 mg 3 times daily as before. 11.  Metformin 1000 mg twice daily starting 12/28/2018. 12.  Nitrostat sublingual p.r.n. 13.  Crestor 40 mg daily. 14.  Sotalol 80 mg 1/2 tablet twice daily as before.  DIET:  Low-salt, low-cholesterol, 1800-calorie ADA diet.  Post-PTCA stent instructions have been given.  Follow up with me in 1 week.  CONDITION AT DISCHARGE:  Stable.  BRIEF HISTORY:  The patient is a 63 year old male with past medical history significant for coronary artery disease, history of non-Q-wave  myocardial infarction in the past, hypertension, hyperlipidemia, paroxysmal atrial fibrillation, history of tobacco  abuse, who was admitted on 12/23/2018 by Dr. [name] because of left-sided nonradiating chest pain since midnight.  Denies any shortness of breath, nausea or sweating spell.  He had some relief with morphine and nitro drip.  His troponin I was slightly  elevated at 0.14.  EKG done in the ED showed normal sinus rhythm with minor T-wave changes in inferolateral leads.  PHYSICAL EXAMINATION: GENERAL:  He was alert, awake, oriented x3. VITAL SIGNS:  His blood pressure was 109/66, pulse 66.  He was afebrile. HEENT:  Conjunctivae are pink. NECK:  Supple, no JVD, no bruit. LUNGS:  Clear to auscultation without rhonchi or rales. CARDIOVASCULAR:  S1, S2 was normal.  There was II/VI systolic murmur.  No click, rub or gallop. ABDOMEN:  Soft.  Bowel sounds are present, nontender. EXTREMITIES:  There is no clubbing, cyanosis or edema.  LABORATORY DATA:  His sodium was 140, potassium 4.3, glucose 182, BUN 15, creatinine 0.94.  Hemoglobin 13.4, hematocrit 41.7, white count of 7.0.  Troponin I was 0.14, 0.16, 0.18.  Today it is 0.08, which is trending down.  Last electrolytes, sodium 140,  potassium 4.0, glucose 158, BUN 11, creatinine 0.80.  Hemoglobin is 13.4, hematocrit 40.1, white count of 7.8.  BRIEF HOSPITAL COURSE:  The patient was admitted to step-down unit.  The patient ruled in for a very small non-Q-wave myocardial infarction due to elevated cardiac enzymes.  The patient was started on IV heparin and IV nitrates and Xarelto was held.  The  patient did not have any episodes of anginal chest pain during the  hospital stay.  The patient subsequently underwent left cardiac catheterization with selective left and right coronary angiography by me, was noted to have patent obtuse marginal 3  stents but had new critical lesion in the mid left circumflex, which was the culprit lesion for his acute  coronary syndrome.  Subsequently, the patient underwent a PTCA stenting to mid left circumflex by Dr. Katy Fitch with excellent angiographic  results.  Post-procedure, the patient had vague GERD symptoms, which improved with a GI cocktail and proton pump inhibitors.  The patient ambulated earlier this morning without any anginal chest pain.  His groin is stable with no evidence of hematoma or  bruit.  DISCHARGE INSTRUCTIONS:  The patient will be discharged home on above medications and will be followed up in my office in 1 week.  The patient will be referred to phase 2 cardiac rehab as outpatient.  The patient has been extensively counseled regarding  compliance with medication, diet and followup.  The patient also was discussed regarding smoking cessation.  We will stop his dual-antiplatelet medication ____ Plavix and continue with Xarelto after 1-2 months.  LN/NUANCE D:12/26/2018 T:12/26/2018 JOB:[id number]

## 2018-12-27 ENCOUNTER — Encounter (HOSPITAL_COMMUNITY): Payer: Medicare HMO

## 2018-12-27 LAB — ECHOCARDIOGRAM COMPLETE
Height: 75 in
Weight: 3996.5 oz

## 2018-12-31 NOTE — Addendum Note (Signed)
Addended by: Forde Dandy on: 12/31/2018 07:25 PM   Modules accepted: Orders

## 2019-01-02 DIAGNOSIS — I10 Essential (primary) hypertension: Secondary | ICD-10-CM | POA: Diagnosis not present

## 2019-01-02 DIAGNOSIS — E785 Hyperlipidemia, unspecified: Secondary | ICD-10-CM | POA: Diagnosis not present

## 2019-01-02 DIAGNOSIS — E119 Type 2 diabetes mellitus without complications: Secondary | ICD-10-CM | POA: Diagnosis not present

## 2019-01-02 DIAGNOSIS — I480 Paroxysmal atrial fibrillation: Secondary | ICD-10-CM | POA: Diagnosis not present

## 2019-01-02 DIAGNOSIS — I214 Non-ST elevation (NSTEMI) myocardial infarction: Secondary | ICD-10-CM | POA: Diagnosis not present

## 2019-01-02 DIAGNOSIS — M1990 Unspecified osteoarthritis, unspecified site: Secondary | ICD-10-CM | POA: Diagnosis not present

## 2019-01-03 MED FILL — PANTOPRAZOLE SOD DR 40 MG T: 40 | 30 days supply | Qty: 30 | Fill #1 | Status: TO

## 2019-01-03 MED FILL — CLOPIDOGREL 75 MG TABLET: 75 | 30 days supply | Qty: 30 | Fill #1 | Status: TO

## 2019-01-03 MED FILL — metFORMIN HCL 1000 MG TABS: 1000 | 30 days supply | Qty: 60 | Fill #1 | Status: TO

## 2019-01-03 MED FILL — ASPIRIN LOW DOSE 81 MG TBEC: 81 | 30 days supply | Qty: 30 | Fill #1

## 2019-01-04 ENCOUNTER — Telehealth (HOSPITAL_COMMUNITY): Payer: Self-pay

## 2019-01-04 NOTE — Telephone Encounter (Signed)
Called patient to see if he is interested in the Cardiac Rehab Program. Patient expressed interest. Explained scheduling process and went over insurance, patient verbalized understanding. Will contact patient for scheduling once f/u has been completed.  °

## 2019-01-04 NOTE — Telephone Encounter (Signed)
Pt insurance is active and benefits verified through [employer] $10.00, DED 0/0 met, out of pocket $3,400.00/0 met, co-insurance 0%. No pre-authorization required. [employer]port, 01/04/2019 @ 9:57, REF# [id number]  Will contact patient to see if he is interested in the Cardiac Rehab Program. If interested, patient will need to complete follow up appt. Once completed, patient will be contacted for scheduling upon review by the RN Navigator.

## 2019-01-04 NOTE — Telephone Encounter (Signed)
Called patient to see if he was interested in participating in the Cardiac Rehab Program. Patient stated yes. Patient will come in for orientation on 02/01/2019 @ 8:30 and will attend the 9:45 exercise class.  Mailed homework package.

## 2019-01-19 ENCOUNTER — Other Ambulatory Visit: Payer: Self-pay | Admitting: Internal Medicine

## 2019-01-23 ENCOUNTER — Encounter (HOSPITAL_COMMUNITY): Payer: Self-pay | Admitting: Cardiology

## 2019-01-23 MED ORDER — CLOPIDOGREL BISULFATE 300 MG PO TABS
ORAL_TABLET | ORAL | Status: DC | PRN
  Administered 2018-12-25: 600 mg via ORAL

## 2019-01-26 ENCOUNTER — Telehealth (HOSPITAL_COMMUNITY): Payer: Self-pay

## 2019-01-26 NOTE — Progress Notes (Signed)
[name] 63 y.o. male DOB 1956/10/02 MRN [id number]       Nutrition Screen Note  No diagnosis found. Past Medical History:  Diagnosis Date  . Atrial arrhythmia   . CAP (community acquired pneumonia)   . Cellulitis of knee, right 06/06/12  . Chronic otitis externa 06/06/12   C Multiple trials with antibiocs and antifungal. Cultures were positive for Pseudomonas. Follow up with ENT on 06/14/12    . CORONARY ARTERY DISEASE 11/25/2006   Cardiac cath by Dr Terrence Dupont 11/2003 LV showed good LV systolic function, EF of 97-35%. Left main was patent. LAD has 20-30% mid stenosis. Diagonal 1 and diagonal 2 were patent. Left circumflex was patent. OM1 was less than 0.5 mm which was diffusely diseased. OM2 has 20% ostial stenosis which was patent which  was moderate size. OM3 was patent at prior PTCA and stented site. RCA has 20-30% proximal   . Hypertension   . NSTEMI (non-ST elevated myocardial infarction) ([place]) 08/14/2012   S/p Successful PTCA to proximal OM-3     . TOBACCO ABUSE 11/25/2006  . Type II diabetes mellitus ([place])    Meds reviewed.    Current Outpatient Medications (Endocrine & Metabolic):  .  empagliflozin (JARDIANCE) 10 MG TABS tablet, TAKE 1 TABLET BY MOUTH ONCE DAILY .  metFORMIN (GLUCOPHAGE) 1000 MG tablet, Take 1 tablet (1,000 mg total) by mouth 2 (two) times daily with a meal.  Current Outpatient Medications (Cardiovascular):  .  isosorbide mononitrate (IMDUR) 30 MG 24 hr tablet, Take 1 tablet (30 mg total) by mouth daily. .  nitroGLYCERIN (NITROSTAT) 0.4 MG SL tablet, DISSOLVE ONE TABLET UNDER THE TONGUE EVERY 5 MINUTES AS NEEDED FOR CHEST PAIN.  DO NOT EXCEED A TOTAL OF 3 DOSES IN 15 MINUTES (Patient taking differently: Place 0.4 mg under the tongue every 5 (five) minutes as needed for chest pain. ) .  rosuvastatin (CRESTOR) 40 MG tablet, Take 1 tablet (40 mg total) by mouth at bedtime. (Patient taking differently: Take 40 mg by mouth daily. ) .  sotalol (BETAPACE) 80 MG tablet,  Take 0.5 tablets (40 mg total) by mouth 2 (two) times daily. (Patient taking differently: Take 40 mg by mouth daily. )  Current Outpatient Medications (Respiratory):  .  promethazine (PHENERGAN) 12.5 MG tablet, Take 1 tablet (12.5 mg total) by mouth every 6 (six) hours as needed for nausea or vomiting.  Current Outpatient Medications (Analgesics):  .  aspirin EC 81 MG EC tablet, Take 1 tablet (81 mg total) by mouth daily.  Current Outpatient Medications (Hematological):  .  clopidogrel (PLAVIX) 75 MG tablet, Take 1 tablet (75 mg total) by mouth daily. .  rivaroxaban (XARELTO) 20 MG TABS tablet, Take 1 tablet (20 mg total) by mouth daily.  Current Outpatient Medications (Other):  .  alum & mag hydroxide-simeth (MAALOX/MYLANTA) 200-200-20 MG/5ML suspension, Take 30 mLs by mouth every 6 (six) hours as needed for indigestion or heartburn. .  gabapentin (NEURONTIN) 300 MG capsule, TAKE 2 CAPSULES BY MOUTH THREE TIMES DAILY .  pantoprazole (PROTONIX) 40 MG tablet, Take 1 tablet (40 mg total) by mouth daily.   HT: Ht Readings from Last 1 Encounters:  12/23/18 6\' 3"  (1.905 m)    WT: Wt Readings from Last 5 Encounters:  12/26/18 249 lb 12.5 oz (113.3 kg)  10/06/18 256 lb 4.8 oz (116.3 kg)  09/04/18 256 lb 11.2 oz (116.4 kg)  01/19/18 257 lb 14.4 oz (117 kg)  08/22/17 260 lb 12.8 oz (118.3 kg)  BMI = 32.04 10/06/18   Current tobacco use? No     Quit 8/19  Labs:  Lipid Panel     Component Value Date/Time   CHOL 83 12/24/2018 0339   CHOL 121 08/24/2018 1559   TRIG 44 12/24/2018 0339   HDL 34 (L) 12/24/2018 0339   HDL 43 08/24/2018 1559   CHOLHDL 2.4 12/24/2018 0339   VLDL 9 12/24/2018 0339   LDLCALC 40 12/24/2018 0339   LDLCALC 62 08/24/2018 1559    Lab Results  Component Value Date   HGBA1C 6.5 (A) 08/24/2018   CBG (last 3)  No results for input(s): GLUCAP in the last 72 hours.  Nutrition Diagnosis ? Food-and nutrition-related knowledge deficit related to lack of  exposure to information as related to diagnosis of: ? CVD ? Type 2 Diabetes ? Obese  I = 30-34.9 related to excessive energy intake as evidenced by a BMI = 32.04 10/06/18    Nutrition Goal(s):  ? To be determined  Plan:  Pt to attend nutrition classes ? Nutrition I ? Nutrition II ? Portion Distortion  ? Diabetes Blitz ? Diabetes Q & A Will provide client-centered nutrition education as part of interdisciplinary care.   Monitor and evaluate progress toward nutrition goal with team.  Laurina Bustle, MS, RD, LDN 01/26/2019 11:49 AM

## 2019-01-30 NOTE — Telephone Encounter (Signed)
Cardiac Rehab - Pharmacy Resident Documentation   Patient unable to be reached after three call attempts. Please complete allergy verification and medication review during patient's cardiac rehab appointment.    Harrietta Guardian, PharmD PGY1 Pharmacy Resident 01/30/2019    1:31 PM Please check AMION for all [place] numbers

## 2019-01-31 DIAGNOSIS — H6243 Otitis externa in other diseases classified elsewhere, bilateral: Secondary | ICD-10-CM | POA: Diagnosis not present

## 2019-01-31 DIAGNOSIS — F17200 Nicotine dependence, unspecified, uncomplicated: Secondary | ICD-10-CM | POA: Diagnosis not present

## 2019-01-31 DIAGNOSIS — B369 Superficial mycosis, unspecified: Secondary | ICD-10-CM | POA: Diagnosis not present

## 2019-02-01 ENCOUNTER — Inpatient Hospital Stay (HOSPITAL_COMMUNITY)
Admission: RE | Admit: 2019-02-01 | Discharge: 2019-02-01 | Disposition: A | Discharge status: Home / Self Care | Payer: Medicare HMO | Source: Ambulatory Visit

## 2019-02-05 ENCOUNTER — Ambulatory Visit (HOSPITAL_COMMUNITY): Payer: Medicare HMO

## 2019-02-07 ENCOUNTER — Ambulatory Visit (HOSPITAL_COMMUNITY): Payer: Medicare HMO

## 2019-02-09 ENCOUNTER — Other Ambulatory Visit: Payer: Self-pay | Admitting: Internal Medicine

## 2019-02-09 ENCOUNTER — Ambulatory Visit (HOSPITAL_COMMUNITY): Payer: Medicare HMO

## 2019-02-09 DIAGNOSIS — R109 Unspecified abdominal pain: Secondary | ICD-10-CM

## 2019-02-12 ENCOUNTER — Ambulatory Visit (HOSPITAL_COMMUNITY): Payer: Medicare HMO

## 2019-02-13 ENCOUNTER — Telehealth (HOSPITAL_COMMUNITY): Payer: Self-pay

## 2019-02-13 NOTE — Telephone Encounter (Signed)
Called and spoke with pt in regards to CR, adv pt we have recv'd their referral. And at this time we are not scheduling due to the COVID-19. Once we have resume scheduling we will contact he. Patient verbalized understanding. °

## 2019-02-14 ENCOUNTER — Ambulatory Visit (HOSPITAL_COMMUNITY): Payer: Medicare HMO

## 2019-02-16 ENCOUNTER — Ambulatory Visit (HOSPITAL_COMMUNITY): Payer: Medicare HMO

## 2019-02-19 ENCOUNTER — Ambulatory Visit (INDEPENDENT_AMBULATORY_CARE_PROVIDER_SITE_OTHER): Payer: Medicare HMO | Admitting: Internal Medicine

## 2019-02-19 ENCOUNTER — Encounter: Payer: Self-pay | Admitting: Internal Medicine

## 2019-02-19 ENCOUNTER — Ambulatory Visit (HOSPITAL_COMMUNITY): Payer: Medicare HMO

## 2019-02-19 ENCOUNTER — Other Ambulatory Visit: Payer: Self-pay

## 2019-02-19 VITALS — BP 138/81 | HR 61 | Temp 98.2°F | Ht 75.0 in | Wt 253.6 lb

## 2019-02-19 DIAGNOSIS — I252 Old myocardial infarction: Secondary | ICD-10-CM

## 2019-02-19 DIAGNOSIS — Z7901 Long term (current) use of anticoagulants: Secondary | ICD-10-CM

## 2019-02-19 DIAGNOSIS — I2510 Atherosclerotic heart disease of native coronary artery without angina pectoris: Secondary | ICD-10-CM | POA: Diagnosis not present

## 2019-02-19 DIAGNOSIS — I480 Paroxysmal atrial fibrillation: Secondary | ICD-10-CM

## 2019-02-19 DIAGNOSIS — R809 Proteinuria, unspecified: Secondary | ICD-10-CM

## 2019-02-19 DIAGNOSIS — M47817 Spondylosis without myelopathy or radiculopathy, lumbosacral region: Secondary | ICD-10-CM

## 2019-02-19 DIAGNOSIS — E1121 Type 2 diabetes mellitus with diabetic nephropathy: Secondary | ICD-10-CM | POA: Diagnosis not present

## 2019-02-19 DIAGNOSIS — I4891 Unspecified atrial fibrillation: Secondary | ICD-10-CM

## 2019-02-19 DIAGNOSIS — G8929 Other chronic pain: Secondary | ICD-10-CM

## 2019-02-19 DIAGNOSIS — Z7984 Long term (current) use of oral hypoglycemic drugs: Secondary | ICD-10-CM

## 2019-02-19 DIAGNOSIS — I10 Essential (primary) hypertension: Secondary | ICD-10-CM | POA: Diagnosis not present

## 2019-02-19 DIAGNOSIS — Z79891 Long term (current) use of opiate analgesic: Secondary | ICD-10-CM

## 2019-02-19 DIAGNOSIS — Z79899 Other long term (current) drug therapy: Secondary | ICD-10-CM

## 2019-02-19 DIAGNOSIS — E1129 Type 2 diabetes mellitus with other diabetic kidney complication: Secondary | ICD-10-CM

## 2019-02-19 MED ORDER — DULOXETINE HCL 20 MG PO CPEP: 40.0000 mg | ORAL_CAPSULE | Freq: Every day | ORAL | 3 refills | Status: DC

## 2019-02-19 MED ORDER — DULOXETINE HCL 20 MG PO CPEP: 20.0000 mg | ORAL_CAPSULE | Freq: Every day | ORAL | 3 refills | Status: DC

## 2019-02-19 MED ORDER — GABAPENTIN 300 MG PO CAPS: 600.0000 mg | ORAL_CAPSULE | Freq: Three times a day (TID) | ORAL | 3 refills | Status: DC

## 2019-02-19 MED ORDER — LISINOPRIL 5 MG PO TABS: 5.0000 mg | ORAL_TABLET | Freq: Every day | ORAL | 3 refills | Status: DC

## 2019-02-19 NOTE — Assessment & Plan Note (Signed)
Chronic lower back pain: [name] unfortunately has been suffering from chronic low back pain dating back to 2014 secondary to severe foraminal narrowing.  Previous management of his chronic back pain has included referral to pain management, intra-articular ejections and several neuropathic medications including Lyrica and gabapentin.  He continues to endorse chronic low back pain with no red flags such as lower extremity weakness, point tenderness, fevers, chills or urinary or bowel incontinence.  He was previously on morphine 15 mg which was last refilled on December 21, 2018 and recently oxycodone 5 mg #24 which was last refilled on January 31, 2019.  He tells me that he was previously seen pain management physician in [place] however he has not visited the physician this year as he is uncomfortable.  Per chart review, he was dismissed from pain clinic in 2016 due to an abnormal UDS.  Plan: -Advised patient to increase nighttime gabapentin to 900 mg and continue an afternoon dose of 600 mg. -Prescription for duloxetine 40 mg daily has been written (there is room to titrate if no relief) -He tells me he has been previously evaluated by orthopedic surgeon but has not seen any recently.

## 2019-02-19 NOTE — Assessment & Plan Note (Signed)
Hypertension: Fairly controlled on Imdur 30 mg daily, sotalol 40 mg twice daily (also with history of atrial fibrillation).  Blood pressure today at the clinic was 138/81 with pulse of 61.  Previous clinic BPs has ranged 120s-150s/70s-80s.  As stated earlier he was previously on lisinopril and spironolactone however these 2 were discontinued secondary to low blood pressures.  He does have coronary artery disease, type 2 diabetes mellitus with nephropathy and will benefit from resuming ACE inhibitor.  BP Readings from Last 3 Encounters:  02/19/19 138/81  12/26/18 140/81  11/27/18 121/88    Plan: - Continue sotalol 40 mg twice daily, Imdur 30 mg daily -Start lisinopril 5 mg daily - BMP order has been placed for 1 week.

## 2019-02-19 NOTE — Patient Instructions (Signed)
Hi [name],  It was a pleasure taking care of you at the clinic today.  Here my recommendations today's visit.  1.  For your back pain, I would advise that you increase your nighttime dose of gabapentin to 900 mg and keep your morning and afternoon dose at 600 mg.  I have also prescribed you duloxetine which will help with your musculoskeletal/neck pain. 2.  For your diabetes and blood pressure, I have added another blood pressure medication called lisinopril which you take once a day. 3.  I will placed a lab order for you to get some basic labs done in 1 week 4.  You should expect a call from [place] in 2 to 4 weeks. 5.  I am getting a hemoglobin A1c today to follow-up on your diabetes.  Dr. Eileen Stanford  Please call the internal medicine center clinic if you have any questions or concerns, we may be able to help and keep you from a long and expensive emergency room wait. Our clinic and after hours phone number is [phone], the best time to call is Monday through Friday 9 am to 4 pm but there is always someone available 24/7 if you have an emergency. If you need medication refills please notify your pharmacy one week in advance and they will send [place] a request.

## 2019-02-19 NOTE — Assessment & Plan Note (Signed)
Type 2 diabetes mellitus: Well-controlled with metformin 1000 mg twice daily and Jardiance 10 mg daily.  His last A1c was 6.5% in September 2019.  He was noted to have proteinuria on the urinalysis which was done in February 2019.  He was previously on an ACE inhibitor however this was discontinued due to soft blood pressures.   Plan: - A1c - Continue metformin 1000 mg twice daily and Jardiance 10 mg daily - Start lisinopril 5 mg daily for renal protection.

## 2019-02-19 NOTE — Assessment & Plan Note (Addendum)
Paroxysmal atrial fibrillation: Patient was recently admitted to the hospital in January 2020 was found to have an NSTEMI.  He was started on Xarelto 20 mg as he had a CHADS-VASc score of 3.  Per the discharge summary, he was asked to discontinue dual antiplatelets and continue Xarelto after 1 to 2 months.  [name] was asked to follow-up with Dr. Terrence Dupont for hospital follow-up visit.

## 2019-02-19 NOTE — Progress Notes (Signed)
   CC: Low back pain  HPI:  [name] is a 63 y.o. with medical history listed below presenting for evaluation of chronic low back pain.  Please see problem based charting for further details.   Past Medical History:  Diagnosis Date  . Atrial arrhythmia   . CAP (community acquired pneumonia)   . Cellulitis of knee, right 06/06/12  . Chronic otitis externa 06/06/12   C Multiple trials with antibiocs and antifungal. Cultures were positive for Pseudomonas. Follow up with ENT on 06/14/12    . CORONARY ARTERY DISEASE 11/25/2006   Cardiac cath by Dr Terrence Dupont 11/2003 LV showed good LV systolic function, EF of 16-10%. Left main was patent. LAD has 20-30% mid stenosis. Diagonal 1 and diagonal 2 were patent. Left circumflex was patent. OM1 was less than 0.5 mm which was diffusely diseased. OM2 has 20% ostial stenosis which was patent which  was moderate size. OM3 was patent at prior PTCA and stented site. RCA has 20-30% proximal   . Hypertension   . NSTEMI (non-ST elevated myocardial infarction) ([place]) 08/14/2012   S/p Successful PTCA to proximal OM-3     . TOBACCO ABUSE 11/25/2006  . Type II diabetes mellitus ([place])    Review of Systems:  As per HPI  Physical Exam:  Vitals:   02/19/19 0839  BP: 138/81  Pulse: 61  Temp: 98.2 F (36.8 C)  TempSrc: Oral  SpO2: 100%  Weight: 253 lb 9.6 oz (115 kg)  Height: 6\' 3"  (1.905 m)   Physical Exam Vitals signs and nursing note reviewed.  Constitutional:      General: He is not in acute distress.    Appearance: He is obese. He is not ill-appearing.  HENT:     Head: Normocephalic and atraumatic.  Cardiovascular:     Rate and Rhythm: Normal rate.     Heart sounds: Normal heart sounds. No murmur. No friction rub. No gallop.   Pulmonary:     Effort: Pulmonary effort is normal.     Breath sounds: Normal breath sounds.  Musculoskeletal: Normal range of motion.        General: Tenderness (Lunbar area, NO point tenderness, discomfort on rotation of  torso) present. No deformity.  Neurological:     General: No focal deficit present.     Mental Status: He is alert.     Cranial Nerves: No cranial nerve deficit.     Sensory: No sensory deficit.     Motor: No weakness.     Gait: Gait normal.     Assessment & Plan:   See Encounters Tab for problem based charting.  Patient discussed with Dr. Lynnae January

## 2019-02-21 ENCOUNTER — Ambulatory Visit (HOSPITAL_COMMUNITY): Payer: Medicare HMO

## 2019-02-21 NOTE — Progress Notes (Signed)
Internal Medicine Clinic Attending  Case discussed with Dr. Agyei at the time of the visit.  We reviewed the resident's history and exam and pertinent patient test results.  I agree with the assessment, diagnosis, and plan of care documented in the resident's note.    

## 2019-02-23 ENCOUNTER — Ambulatory Visit (HOSPITAL_COMMUNITY): Payer: Medicare HMO

## 2019-02-26 ENCOUNTER — Ambulatory Visit (HOSPITAL_COMMUNITY): Payer: Medicare HMO

## 2019-02-28 ENCOUNTER — Ambulatory Visit (HOSPITAL_COMMUNITY): Payer: Medicare HMO

## 2019-03-02 ENCOUNTER — Ambulatory Visit (HOSPITAL_COMMUNITY): Payer: Medicare HMO

## 2019-03-05 ENCOUNTER — Ambulatory Visit (HOSPITAL_COMMUNITY): Payer: Medicare HMO

## 2019-03-07 ENCOUNTER — Ambulatory Visit (HOSPITAL_COMMUNITY): Payer: Medicare HMO

## 2019-03-09 ENCOUNTER — Ambulatory Visit (HOSPITAL_COMMUNITY): Payer: Medicare HMO

## 2019-03-12 ENCOUNTER — Ambulatory Visit (HOSPITAL_COMMUNITY): Payer: Medicare HMO

## 2019-03-14 ENCOUNTER — Ambulatory Visit (HOSPITAL_COMMUNITY): Payer: Medicare HMO

## 2019-03-16 ENCOUNTER — Ambulatory Visit (HOSPITAL_COMMUNITY): Payer: Medicare HMO

## 2019-03-19 ENCOUNTER — Ambulatory Visit (HOSPITAL_COMMUNITY): Payer: Medicare HMO

## 2019-03-20 ENCOUNTER — Other Ambulatory Visit: Payer: Self-pay | Admitting: *Deleted

## 2019-03-20 DIAGNOSIS — E1121 Type 2 diabetes mellitus with diabetic nephropathy: Secondary | ICD-10-CM

## 2019-03-20 NOTE — Addendum Note (Signed)
Addended by: Truddie Crumble on: 03/20/2019 11:17 AM   Modules accepted: Orders

## 2019-03-21 ENCOUNTER — Ambulatory Visit (HOSPITAL_COMMUNITY): Payer: Medicare HMO

## 2019-03-23 ENCOUNTER — Ambulatory Visit (HOSPITAL_COMMUNITY): Payer: Medicare HMO

## 2019-03-23 DIAGNOSIS — M5136 Other intervertebral disc degeneration, lumbar region: Secondary | ICD-10-CM | POA: Diagnosis not present

## 2019-03-23 DIAGNOSIS — M47816 Spondylosis without myelopathy or radiculopathy, lumbar region: Secondary | ICD-10-CM | POA: Diagnosis not present

## 2019-03-23 DIAGNOSIS — G894 Chronic pain syndrome: Secondary | ICD-10-CM | POA: Diagnosis not present

## 2019-03-26 ENCOUNTER — Ambulatory Visit (HOSPITAL_COMMUNITY): Payer: Medicare HMO

## 2019-03-27 ENCOUNTER — Telehealth (HOSPITAL_COMMUNITY): Payer: Self-pay | Admitting: *Deleted

## 2019-03-28 ENCOUNTER — Ambulatory Visit (HOSPITAL_COMMUNITY): Payer: Medicare HMO

## 2019-03-30 ENCOUNTER — Ambulatory Visit (HOSPITAL_COMMUNITY): Payer: Medicare HMO

## 2019-04-01 ENCOUNTER — Other Ambulatory Visit: Payer: Self-pay | Admitting: Internal Medicine

## 2019-04-02 ENCOUNTER — Ambulatory Visit (HOSPITAL_COMMUNITY): Payer: Medicare HMO

## 2019-04-04 ENCOUNTER — Ambulatory Visit (HOSPITAL_COMMUNITY): Payer: Medicare HMO

## 2019-04-06 ENCOUNTER — Ambulatory Visit (HOSPITAL_COMMUNITY): Payer: Medicare HMO

## 2019-04-08 ENCOUNTER — Other Ambulatory Visit: Payer: Self-pay | Admitting: Internal Medicine

## 2019-04-08 DIAGNOSIS — R109 Unspecified abdominal pain: Secondary | ICD-10-CM

## 2019-04-09 ENCOUNTER — Ambulatory Visit (HOSPITAL_COMMUNITY): Payer: Medicare HMO

## 2019-04-11 ENCOUNTER — Ambulatory Visit (HOSPITAL_COMMUNITY): Payer: Medicare HMO

## 2019-04-13 ENCOUNTER — Ambulatory Visit (HOSPITAL_COMMUNITY): Payer: Medicare HMO

## 2019-04-13 DIAGNOSIS — I252 Old myocardial infarction: Secondary | ICD-10-CM | POA: Diagnosis not present

## 2019-04-13 DIAGNOSIS — E119 Type 2 diabetes mellitus without complications: Secondary | ICD-10-CM | POA: Diagnosis not present

## 2019-04-13 DIAGNOSIS — I480 Paroxysmal atrial fibrillation: Secondary | ICD-10-CM | POA: Diagnosis not present

## 2019-04-13 DIAGNOSIS — I10 Essential (primary) hypertension: Secondary | ICD-10-CM | POA: Diagnosis not present

## 2019-04-13 DIAGNOSIS — E785 Hyperlipidemia, unspecified: Secondary | ICD-10-CM | POA: Diagnosis not present

## 2019-04-13 DIAGNOSIS — I2510 Atherosclerotic heart disease of native coronary artery without angina pectoris: Secondary | ICD-10-CM | POA: Diagnosis not present

## 2019-04-13 DIAGNOSIS — M1990 Unspecified osteoarthritis, unspecified site: Secondary | ICD-10-CM | POA: Diagnosis not present

## 2019-04-14 ENCOUNTER — Other Ambulatory Visit: Payer: Self-pay | Admitting: Internal Medicine

## 2019-04-14 DIAGNOSIS — R109 Unspecified abdominal pain: Secondary | ICD-10-CM

## 2019-04-16 ENCOUNTER — Ambulatory Visit (HOSPITAL_COMMUNITY): Payer: Medicare HMO

## 2019-04-17 DIAGNOSIS — M47816 Spondylosis without myelopathy or radiculopathy, lumbar region: Secondary | ICD-10-CM | POA: Diagnosis not present

## 2019-04-17 DIAGNOSIS — M5136 Other intervertebral disc degeneration, lumbar region: Secondary | ICD-10-CM | POA: Diagnosis not present

## 2019-04-17 DIAGNOSIS — G894 Chronic pain syndrome: Secondary | ICD-10-CM | POA: Diagnosis not present

## 2019-04-18 ENCOUNTER — Ambulatory Visit (HOSPITAL_COMMUNITY): Payer: Medicare HMO

## 2019-04-20 ENCOUNTER — Ambulatory Visit (HOSPITAL_COMMUNITY): Payer: Medicare HMO

## 2019-04-25 ENCOUNTER — Ambulatory Visit (HOSPITAL_COMMUNITY): Payer: Medicare HMO

## 2019-04-27 ENCOUNTER — Ambulatory Visit (HOSPITAL_COMMUNITY): Payer: Medicare HMO

## 2019-04-30 ENCOUNTER — Ambulatory Visit (HOSPITAL_COMMUNITY): Payer: Medicare HMO

## 2019-05-02 ENCOUNTER — Ambulatory Visit (HOSPITAL_COMMUNITY): Payer: Medicare HMO

## 2019-05-04 ENCOUNTER — Ambulatory Visit (HOSPITAL_COMMUNITY): Payer: Medicare HMO

## 2019-05-07 ENCOUNTER — Ambulatory Visit (HOSPITAL_COMMUNITY): Payer: Medicare HMO

## 2019-05-09 ENCOUNTER — Ambulatory Visit (HOSPITAL_COMMUNITY): Payer: Medicare HMO

## 2019-05-10 ENCOUNTER — Other Ambulatory Visit: Payer: Self-pay | Admitting: Internal Medicine

## 2019-05-10 DIAGNOSIS — R109 Unspecified abdominal pain: Secondary | ICD-10-CM

## 2019-05-11 ENCOUNTER — Encounter: Payer: Self-pay | Admitting: Internal Medicine

## 2019-05-11 ENCOUNTER — Other Ambulatory Visit: Payer: Self-pay

## 2019-05-11 ENCOUNTER — Ambulatory Visit (INDEPENDENT_AMBULATORY_CARE_PROVIDER_SITE_OTHER): Payer: Medicare HMO | Admitting: Internal Medicine

## 2019-05-11 DIAGNOSIS — R112 Nausea with vomiting, unspecified: Secondary | ICD-10-CM

## 2019-05-11 DIAGNOSIS — R109 Unspecified abdominal pain: Secondary | ICD-10-CM

## 2019-05-11 NOTE — Telephone Encounter (Signed)
Chronic nausea and vomiting has been going on since December.  He used a month supply in just over 2 weeks.  He will need a telehealth visit to address Nausea/vomiting/abdominal pain further prior to refills.    Thanks!  Please call patient and have him added as a telehealth visit at his convenience.

## 2019-05-11 NOTE — Telephone Encounter (Signed)
Telehealth visit done today.

## 2019-05-11 NOTE — Assessment & Plan Note (Signed)
HPI: [name] is a 62 y.o. male with the medical conditions listed below who presented for televisit today for nausea and vomiting f/u. He was seen in the [facility] in December 2019 for persistent nausea, vomiting, and abdominal pain. He was given a trial of phenergan, and has been regularly requesting refills on this medication. Today he states that his symptoms have completely resolved. He has been out of the phenergan for a while and is still nausea free.   Assessment: As his symptoms have completely resolved, no further work-up is necessary at this time. I explained to the patient that he no longer needs the phenergan since he is asymptomatic. He expressed understanding.  Plan - Discontinue phenergan

## 2019-05-11 NOTE — Progress Notes (Signed)
   This is a telephone encounter between [name] and [name]  on 05/11/2019 for nausea and vomiting f/u. The visit was conducted with the patient located at work and [employer] at [employer]. The patient's identity was confirmed using their DOB and current address. The patient has consented to being evaluated through a telephone encounter and understands the associated risks/benefits. I personally spent 5 minutes on medical discussion.   HPI:   [name] is a 63 y.o. male with the medical conditions listed below who presented for televisit today for nausea and vomiting f/u. He was seen in the [facility] in December 2019 for persistent nausea, vomiting, and abdominal pain. He was given a trial of phenergan, and has been regularly requesting refills on this medication. Today he states that his symptoms have completely resolved. He has been out of the phenergan for a while and is still nausea free. Please see problem based charting for the assessment and plan.   Past Medical History:  Diagnosis Date  . Atrial arrhythmia   . CAP (community acquired pneumonia)   . Cellulitis of knee, right 06/06/12  . Chronic otitis externa 06/06/12   C Multiple trials with antibiocs and antifungal. Cultures were positive for Pseudomonas. Follow up with ENT on 06/14/12    . CORONARY ARTERY DISEASE 11/25/2006   Cardiac cath by Dr Terrence Dupont 11/2003 LV showed good LV systolic function, EF of 74-12%. Left main was patent. LAD has 20-30% mid stenosis. Diagonal 1 and diagonal 2 were patent. Left circumflex was patent. OM1 was less than 0.5 mm which was diffusely diseased. OM2 has 20% ostial stenosis which was patent which  was moderate size. OM3 was patent at prior PTCA and stented site. RCA has 20-30% proximal   . Hypertension   . NSTEMI (non-ST elevated myocardial infarction) ([place]) 08/14/2012   S/p Successful PTCA to proximal OM-3     . TOBACCO ABUSE 11/25/2006  . Type II diabetes mellitus ([place])     Review of  Systems:   Pertinent positives mentioned in HPI. Remainder of all ROS negative.   Assessment & Plan:   Patient discussed with Dr. Daryll Drown

## 2019-05-15 NOTE — Progress Notes (Signed)
Internal Medicine Clinic Attending  Case discussed with Dr. Dorrell at the time of the visit.  We reviewed the resident's history, telephone conversation and pertinent patient test results.  I agree with the assessment, diagnosis, and plan of care documented in the resident's note.   

## 2019-05-22 ENCOUNTER — Other Ambulatory Visit: Payer: Self-pay | Admitting: Internal Medicine

## 2019-05-25 ENCOUNTER — Telehealth (HOSPITAL_COMMUNITY): Payer: Self-pay

## 2019-05-25 NOTE — Telephone Encounter (Signed)
No response from pt, closed referral. °

## 2019-05-28 ENCOUNTER — Encounter: Payer: Self-pay | Admitting: *Deleted

## 2019-06-01 ENCOUNTER — Other Ambulatory Visit: Payer: Self-pay | Admitting: Internal Medicine

## 2019-06-11 ENCOUNTER — Telehealth: Payer: Self-pay | Admitting: Internal Medicine

## 2019-06-11 NOTE — Telephone Encounter (Signed)
Call placed to patient to schedule ACC visit. No answer. Left message on VM requesting return call to schedule ACC appt. Will also route to [place] office to assist patient in scheduling appt. Hubbard Hartshorn, RN, BSN

## 2019-06-11 NOTE — Telephone Encounter (Signed)
Returned call to patient. States he was calling to schedule appt with new PCP but was told to call back in a month unless there was an urgent need. States he feels well but wanted PCP to know he is still having hematuria and tarry stools "sometimes." States Dr. Terrence Dupont d/c xarelto about 2 months ago. Please refer to OV note on 01/19/2018. Patient does not want to be seen unless his doctor advises it. Hubbard Hartshorn, RN, BSN

## 2019-06-11 NOTE — Telephone Encounter (Signed)
I prefer patient to be seen in the [facility]. Please schedule at his earliest convenience.

## 2019-06-11 NOTE — Telephone Encounter (Signed)
Pt wanted to let his physician know that he is still blood; but he doesn't need to be seen [phone]

## 2019-06-19 ENCOUNTER — Ambulatory Visit (INDEPENDENT_AMBULATORY_CARE_PROVIDER_SITE_OTHER): Payer: Medicare HMO | Admitting: Internal Medicine

## 2019-06-19 ENCOUNTER — Other Ambulatory Visit: Payer: Self-pay

## 2019-06-19 VITALS — BP 120/68 | HR 69 | Temp 98.2°F | Wt 248.8 lb

## 2019-06-19 DIAGNOSIS — M545 Low back pain: Secondary | ICD-10-CM | POA: Diagnosis not present

## 2019-06-19 DIAGNOSIS — I4891 Unspecified atrial fibrillation: Secondary | ICD-10-CM

## 2019-06-19 DIAGNOSIS — E1121 Type 2 diabetes mellitus with diabetic nephropathy: Secondary | ICD-10-CM | POA: Diagnosis not present

## 2019-06-19 DIAGNOSIS — R319 Hematuria, unspecified: Secondary | ICD-10-CM | POA: Diagnosis not present

## 2019-06-19 DIAGNOSIS — R310 Gross hematuria: Secondary | ICD-10-CM

## 2019-06-19 DIAGNOSIS — I10 Essential (primary) hypertension: Secondary | ICD-10-CM

## 2019-06-19 DIAGNOSIS — Z8679 Personal history of other diseases of the circulatory system: Secondary | ICD-10-CM

## 2019-06-19 DIAGNOSIS — Z7984 Long term (current) use of oral hypoglycemic drugs: Secondary | ICD-10-CM

## 2019-06-19 DIAGNOSIS — E119 Type 2 diabetes mellitus without complications: Secondary | ICD-10-CM

## 2019-06-19 DIAGNOSIS — D649 Anemia, unspecified: Secondary | ICD-10-CM | POA: Diagnosis not present

## 2019-06-19 DIAGNOSIS — I252 Old myocardial infarction: Secondary | ICD-10-CM | POA: Diagnosis not present

## 2019-06-19 LAB — POCT URINALYSIS DIPSTICK
Bilirubin, UA: NEGATIVE
Glucose, UA: POSITIVE — AB
Ketones, UA: NEGATIVE
Leukocytes, UA: NEGATIVE
Nitrite, UA: NEGATIVE
Protein, UA: POSITIVE — AB
Spec Grav, UA: 1.02 (ref 1.010–1.025)
Urobilinogen, UA: 0.2 E.U./dL
pH, UA: 5.5 (ref 5.0–8.0)

## 2019-06-19 LAB — POCT GLYCOSYLATED HEMOGLOBIN (HGB A1C): Hemoglobin A1C: 6.7 % — AB (ref 4.0–5.6)

## 2019-06-19 LAB — GLUCOSE, CAPILLARY: Glucose-Capillary: 134 mg/dL — ABNORMAL HIGH (ref 70–99)

## 2019-06-19 NOTE — Assessment & Plan Note (Signed)
Medications: Metformin 1000 mg twice daily.  Jardiance 10 mg daily.  A1c today 6.7.  This appears to be fairly stable from where it has been in his last few visits.  Plan: Continue metformin and Jardiance.  Follow-up in 3 to 6 months.

## 2019-06-19 NOTE — Patient Instructions (Signed)
Your A1C in 6.7 today. Please continue your current diabetic regimen.

## 2019-06-19 NOTE — Assessment & Plan Note (Addendum)
Was previously on aspirin and Xarelto however due to excessive bleeding, Xarelto had to be discontinued.  Physical exam reveals normal sinus rhythm today.

## 2019-06-19 NOTE — Assessment & Plan Note (Addendum)
We briefly discussed his hematuria.  Urinalysis in office today did reveal moderate red blood cells present in his urine.  He was previously noted to have gross hematuria in the past.  He is followed up with urology regarding this he also follows with cardiology for anticoagulation for his atrial fibrillation.  It is believed that the hematuria stemmed from the anticoagulation given that there was improvement in the hematuria after the Xarelto was held.  I encouraged him to follow regularly with urology and his cardiologist.  And that he should seek evaluation from his urologist immediately if he starts developing clots in his urine.

## 2019-06-19 NOTE — Progress Notes (Signed)
   CC:   HPI:  Mr.Artist S Steig is a 63 y.o. male with past medical history of atrial fibrillation, hypertension, myocardial infarction, type 2 diabetes, low back pain, hematuria, and anemia.  Patient presents today for diabetes management.  Notes no issues with his current regimen of Jardiance and metformin.  Denies any nausea vomiting or diarrhea.  Reports that he takes his medications every day as prescribed.  Checks his blood sugar at home regularly.  Denies any new complaints today.  He follows regularly with cardiology who manages his anticoagulation for his atrial fibrillation.  Follows with urology occasionally regarding some hematuria which was believed to be from the [place].      Past Medical History:  Diagnosis Date  . Atrial arrhythmia   . CAP (community acquired pneumonia)   . Cellulitis of knee, right 06/06/12  . Chronic otitis externa 06/06/12   C Multiple trials with antibiocs and antifungal. Cultures were positive for Pseudomonas. Follow up with ENT on 06/14/12    . CORONARY ARTERY DISEASE 11/25/2006   Cardiac cath by Dr Terrence Dupont 11/2003 LV showed good LV systolic function, EF of 16-10%. Left main was patent. LAD has 20-30% mid stenosis. Diagonal 1 and diagonal 2 were patent. Left circumflex was patent. OM1 was less than 0.5 mm which was diffusely diseased. OM2 has 20% ostial stenosis which was patent which  was moderate size. OM3 was patent at prior PTCA and stented site. RCA has 20-30% proximal   . Hypertension   . NSTEMI (non-ST elevated myocardial infarction) ([place]) 08/14/2012   S/p Successful PTCA to proximal OM-3     . TOBACCO ABUSE 11/25/2006  . Type II diabetes mellitus ([place])    Review of Systems: Denies any new onset cough, shortness of breath, chest pain, abdominal pain, nausea, vomiting, diarrhea, constipation. Physical Exam:  Vitals:   06/19/19 1502  BP: 120/68  Pulse: 69  Temp: 98.2 F (36.8 C)  TempSrc: Oral  SpO2: 97%  Weight: 248 lb 12.8 oz (112.9 kg)     GENERAL: well appearing, in no apparent distress CARDIAC: heart regular rate and rhythm, no peripheral edema appreciated PULMONARY: lung sounds clear to auscultation ABDOMEN: bowel sounds active. No tenderness upon palpation.    Assessment & Plan:   See Encounters Tab for problem based charting.  Pertinent labs & imaging results that were available during my care of the patient were reviewed by me and considered in my medical decision making  Patient is in agreement with the plan and endorses no further questions at this time.  Patient seen with Dr. Clista Bernhardt, MD Internal Medicine Resident-PGY1 06/19/19

## 2019-06-20 LAB — MICROALBUMIN / CREATININE URINE RATIO
Creatinine, Urine: 51.5 mg/dL
Microalb/Creat Ratio: 841 mg/g creat — ABNORMAL HIGH (ref 0–29)
Microalbumin, Urine: 433.1 ug/mL

## 2019-06-22 NOTE — Progress Notes (Signed)
Internal Medicine Clinic Attending  I saw and evaluated the patient.  I personally confirmed the key portions of the history and exam documented by Dr. Christian and I reviewed pertinent patient test results.  The assessment, diagnosis, and plan were formulated together and I agree with the documentation in the resident's note.  Alexander Raines, M.D., Ph.D.  

## 2019-06-26 ENCOUNTER — Other Ambulatory Visit: Payer: Self-pay | Admitting: *Deleted

## 2019-06-26 DIAGNOSIS — E1121 Type 2 diabetes mellitus with diabetic nephropathy: Secondary | ICD-10-CM

## 2019-06-26 MED ORDER — LISINOPRIL 5 MG PO TABS: 5.0000 mg | ORAL_TABLET | Freq: Every day | ORAL | 3 refills | Status: DC

## 2019-06-27 DIAGNOSIS — M47816 Spondylosis without myelopathy or radiculopathy, lumbar region: Secondary | ICD-10-CM | POA: Diagnosis not present

## 2019-06-27 DIAGNOSIS — Z79891 Long term (current) use of opiate analgesic: Secondary | ICD-10-CM | POA: Diagnosis not present

## 2019-06-27 DIAGNOSIS — M5136 Other intervertebral disc degeneration, lumbar region: Secondary | ICD-10-CM | POA: Diagnosis not present

## 2019-06-27 DIAGNOSIS — G894 Chronic pain syndrome: Secondary | ICD-10-CM | POA: Diagnosis not present

## 2019-07-11 ENCOUNTER — Other Ambulatory Visit: Payer: Self-pay | Admitting: Internal Medicine

## 2019-07-12 ENCOUNTER — Other Ambulatory Visit: Payer: Self-pay | Admitting: Internal Medicine

## 2019-07-12 DIAGNOSIS — M47817 Spondylosis without myelopathy or radiculopathy, lumbosacral region: Secondary | ICD-10-CM

## 2019-07-13 DIAGNOSIS — I480 Paroxysmal atrial fibrillation: Secondary | ICD-10-CM | POA: Diagnosis not present

## 2019-07-13 DIAGNOSIS — M1990 Unspecified osteoarthritis, unspecified site: Secondary | ICD-10-CM | POA: Diagnosis not present

## 2019-07-13 DIAGNOSIS — E785 Hyperlipidemia, unspecified: Secondary | ICD-10-CM | POA: Diagnosis not present

## 2019-07-13 DIAGNOSIS — I2510 Atherosclerotic heart disease of native coronary artery without angina pectoris: Secondary | ICD-10-CM | POA: Diagnosis not present

## 2019-07-13 DIAGNOSIS — I252 Old myocardial infarction: Secondary | ICD-10-CM | POA: Diagnosis not present

## 2019-07-13 DIAGNOSIS — I10 Essential (primary) hypertension: Secondary | ICD-10-CM | POA: Diagnosis not present

## 2019-07-13 DIAGNOSIS — E119 Type 2 diabetes mellitus without complications: Secondary | ICD-10-CM | POA: Diagnosis not present

## 2019-07-13 DIAGNOSIS — M791 Myalgia, unspecified site: Secondary | ICD-10-CM | POA: Diagnosis not present

## 2019-07-13 MED ORDER — ISOSORBIDE MONONITRATE ER 30 MG PO TB24: 30.0000 mg | ORAL_TABLET | Freq: Every day | ORAL | 0 refills | Status: DC

## 2019-07-24 ENCOUNTER — Telehealth: Payer: Self-pay

## 2019-07-24 NOTE — Telephone Encounter (Signed)
Pt calls and states appr 3 days ago he had 1 episode of severe vomiting, called EMS and they told him he probably had a "bug". He is feeling much better and denies any recent N&V, chest pain, short of breath, severe h/a.  He also ask about an appt w/ his new pcp, he states he is supposed to have one in OCT, he is informed he does not have one scheduled at this time. Will send to front office for appt, dr winters you have nothing available before the first of the year, if you would like to see pt talk to [facility] about overbook.

## 2019-07-24 NOTE — Telephone Encounter (Signed)
Requesting to speak with a nurse about feeling nausea and vomiting. Please call pt back.

## 2019-07-30 ENCOUNTER — Ambulatory Visit: Payer: Medicare HMO

## 2019-08-16 ENCOUNTER — Telehealth: Payer: Self-pay | Admitting: *Deleted

## 2019-08-16 NOTE — Telephone Encounter (Signed)
Received fax from [place] requesting refill on omeprazole 20 mg. No longer on current med list. Last Rx was for Protonix 40 mg sent 12/27/2018 with 3 refills. Hubbard Hartshorn, RN, BSN

## 2019-08-17 ENCOUNTER — Emergency Department (HOSPITAL_COMMUNITY)
Admission: EM | Admit: 2019-08-17 | Discharge: 2019-08-18 | Disposition: A | Discharge status: Home / Self Care | Payer: Medicare HMO | Attending: Emergency Medicine | Admitting: Emergency Medicine

## 2019-08-17 ENCOUNTER — Emergency Department (HOSPITAL_COMMUNITY): Payer: Medicare HMO

## 2019-08-17 DIAGNOSIS — I10 Essential (primary) hypertension: Secondary | ICD-10-CM | POA: Insufficient documentation

## 2019-08-17 DIAGNOSIS — I2510 Atherosclerotic heart disease of native coronary artery without angina pectoris: Secondary | ICD-10-CM | POA: Insufficient documentation

## 2019-08-17 DIAGNOSIS — Z87891 Personal history of nicotine dependence: Secondary | ICD-10-CM | POA: Insufficient documentation

## 2019-08-17 DIAGNOSIS — R509 Fever, unspecified: Secondary | ICD-10-CM | POA: Diagnosis not present

## 2019-08-17 DIAGNOSIS — Z7984 Long term (current) use of oral hypoglycemic drugs: Secondary | ICD-10-CM | POA: Diagnosis not present

## 2019-08-17 DIAGNOSIS — Z7982 Long term (current) use of aspirin: Secondary | ICD-10-CM | POA: Diagnosis not present

## 2019-08-17 DIAGNOSIS — E119 Type 2 diabetes mellitus without complications: Secondary | ICD-10-CM | POA: Diagnosis not present

## 2019-08-17 DIAGNOSIS — Z79899 Other long term (current) drug therapy: Secondary | ICD-10-CM | POA: Diagnosis not present

## 2019-08-17 LAB — CBC WITH DIFFERENTIAL/PLATELET
Abs Immature Granulocytes: 0.02 10*3/uL (ref 0.00–0.07)
Basophils Absolute: 0 10*3/uL (ref 0.0–0.1)
Basophils Relative: 0 %
Eosinophils Absolute: 0 10*3/uL (ref 0.0–0.5)
Eosinophils Relative: 0 %
HCT: 44.1 % (ref 39.0–52.0)
Hemoglobin: 15.5 g/dL (ref 13.0–17.0)
Immature Granulocytes: 0 %
Lymphocytes Relative: 17 %
Lymphs Abs: 1.4 10*3/uL (ref 0.7–4.0)
MCH: 30.3 pg (ref 26.0–34.0)
MCHC: 35.1 g/dL (ref 30.0–36.0)
MCV: 86.3 fL (ref 80.0–100.0)
Monocytes Absolute: 0.5 10*3/uL (ref 0.1–1.0)
Monocytes Relative: 7 %
Neutro Abs: 6.3 10*3/uL (ref 1.7–7.7)
Neutrophils Relative %: 76 %
Platelets: 104 10*3/uL — ABNORMAL LOW (ref 150–400)
RBC: 5.11 MIL/uL (ref 4.22–5.81)
RDW: 14.7 % (ref 11.5–15.5)
WBC: 8.2 10*3/uL (ref 4.0–10.5)
nRBC: 0 % (ref 0.0–0.2)

## 2019-08-17 LAB — COMPREHENSIVE METABOLIC PANEL
ALT: 27 U/L (ref 0–44)
AST: 26 U/L (ref 15–41)
Albumin: 3.3 g/dL — ABNORMAL LOW (ref 3.5–5.0)
Alkaline Phosphatase: 57 U/L (ref 38–126)
Anion gap: 12 (ref 5–15)
BUN: 16 mg/dL (ref 8–23)
CO2: 21 mmol/L — ABNORMAL LOW (ref 22–32)
Calcium: 8.8 mg/dL — ABNORMAL LOW (ref 8.9–10.3)
Chloride: 99 mmol/L (ref 98–111)
Creatinine, Ser: 1.1 mg/dL (ref 0.61–1.24)
GFR calc Af Amer: >60 mL/min (ref >60)
GFR calc non Af Amer: >60 mL/min (ref >60)
Glucose, Bld: 101 mg/dL — ABNORMAL HIGH (ref 70–99)
Potassium: 3.9 mmol/L (ref 3.5–5.1)
Sodium: 132 mmol/L — ABNORMAL LOW (ref 135–145)
Total Bilirubin: 0.7 mg/dL (ref 0.3–1.2)
Total Protein: 7.3 g/dL (ref 6.5–8.1)

## 2019-08-17 LAB — URINALYSIS, ROUTINE W REFLEX MICROSCOPIC
Bilirubin Urine: NEGATIVE
Glucose, UA: >=500 mg/dL — AB
Ketones, ur: NEGATIVE mg/dL
Leukocytes,Ua: NEGATIVE
Nitrite: NEGATIVE
Protein, ur: >=300 mg/dL — AB
Specific Gravity, Urine: 1.027 (ref 1.005–1.030)
pH: 6 (ref 5.0–8.0)

## 2019-08-17 LAB — LACTIC ACID, PLASMA: Lactic Acid, Venous: 1.1 mmol/L (ref 0.5–1.9)

## 2019-08-17 MED ORDER — ACETAMINOPHEN 325 MG PO TABS: 650.0000 mg | ORAL_TABLET | Freq: Once | ORAL | Status: DC

## 2019-08-17 MED ORDER — SODIUM CHLORIDE 0.9 % IV BOLUS: 1000.0000 mL | Freq: Once | INTRAVENOUS | Status: DC

## 2019-08-17 NOTE — ED Triage Notes (Signed)
Pt's wife st's pt got his flu vaccine last week and got his shingle and pneumonia shot yesterday   Pt woke up this am c/o body aches and elevated temp.  Wife st's pt's temp has been up tp 105 today but did not give him amything for fever

## 2019-08-18 NOTE — Discharge Instructions (Addendum)
You were seen today for fever.  This may be related to your recent vaccines.  Monitor for any worsening signs or symptoms of infection.  Take Tylenol as needed for body aches and fever.  Make sure to stay hydrated.

## 2019-08-18 NOTE — ED Provider Notes (Signed)
[facility] EMERGENCY DEPARTMENT Provider Note   CSN: [id number] Arrival date & time: 08/17/19  2158     History   Chief Complaint Chief Complaint  Patient presents with  . Fever    HPI [name] is a 63 y.o. male.     HPI  This is a 63 year old male with a history of coronary artery disease, hypertension, diabetes who presents with fever and not feeling well.  Patient reports that he had his shingles, flu, and pneumonia vaccines on Thursday morning.  He woke up Friday morning with chills.  He felt chilled all day.  His wife reports that he had multiple documented fevers at home up to 102.  He has not taken anything for his symptoms.  He denies chest pain, shortness of breath, abdominal pain, nausea, vomiting, upper respiratory symptoms.  When further asked, he does endorse an occasional dry cough.  He reports that he has been wearing a mask appropriately.  No loss of sense of taste or smell.  Denies urinary symptoms.  Past Medical History:  Diagnosis Date  . Atrial arrhythmia   . CAP (community acquired pneumonia)   . Cellulitis of knee, right 06/06/12  . Chronic otitis externa 06/06/12   C Multiple trials with antibiocs and antifungal. Cultures were positive for Pseudomonas. Follow up with ENT on 06/14/12    . CORONARY ARTERY DISEASE 11/25/2006   Cardiac cath by Dr Terrence Dupont 11/2003 LV showed good LV systolic function, EF of 0000000. Left main was patent. LAD has 20-30% mid stenosis. Diagonal 1 and diagonal 2 were patent. Left circumflex was patent. OM1 was less than 0.5 mm which was diffusely diseased. OM2 has 20% ostial stenosis which was patent which  was moderate size. OM3 was patent at prior PTCA and stented site. RCA has 20-30% proximal   . Hypertension   . NSTEMI (non-ST elevated myocardial infarction) ([place]) 08/14/2012   S/p Successful PTCA to proximal OM-3     . TOBACCO ABUSE 11/25/2006  . Type II diabetes mellitus [facility])     Patient Active Problem List   Diagnosis Date Noted  . Acute coronary syndrome ([place]) 12/23/2018  . Seborrheic dermatitis 08/23/2017  . Peripheral neuropathy 08/22/2017  . Anemia 06/21/2017  . Hematuria, gross 09/10/2016  . Right lumbar radiculitis 11/19/2014  . Healthcare maintenance 12/13/2013  . Dysphagia, unspecified(787.20) 05/10/2013  . Lumbosacral spondylosis without myelopathy 04/18/2013  . Lumbar facet arthropathy 04/18/2013  . Sacroiliac joint dysfunction of left side 02/21/2013  . Leg length discrepancy 02/21/2013  . Seborrhea capitis 01/23/2013  . NSTEMI (non-ST elevated myocardial infarction) ([place]) 08/14/2012  . Tobacco abuse 08/08/2012  . Hypertension 09/25/2011  . GERD 04/08/2010  . Chronic radicular low back pain 07/20/2007  . Diabetes mellitus type 2, controlled ([place]) 11/25/2006  . HYPOGONADISM 11/25/2006  . Coronary atherosclerosis 11/25/2006  . Atrial fibrillation ([place]) 11/25/2006    Past Surgical History:  Procedure Laterality Date  . CORONARY ANGIOPLASTY WITH STENT PLACEMENT  ~ 2005   "1"  . CORONARY STENT INTERVENTION N/A 12/25/2018   Procedure: CORONARY STENT INTERVENTION;  Surgeon: Lorretta Harp, MD;  Location: [place] CV LAB;  Service: Cardiovascular;  Laterality: N/A;  . CYSTECTOMY  1990's   LUA  . LEFT HEART CATH AND CORONARY ANGIOGRAPHY N/A 12/25/2018   Procedure: LEFT HEART CATH AND CORONARY ANGIOGRAPHY;  Surgeon: Charolette Forward, MD;  Location: [place] CV LAB;  Service: Cardiovascular;  Laterality: N/A;  . LEFT HEART CATHETERIZATION WITH CORONARY ANGIOGRAM N/A 08/15/2012  Procedure: LEFT HEART CATHETERIZATION WITH CORONARY ANGIOGRAM;  Surgeon: Clent Demark, MD;  Location: [facility] CATH LAB;  Service: Cardiovascular;  Laterality: N/A;  . PERCUTANEOUS CORONARY STENT INTERVENTION (PCI-S)  08/15/2012   Procedure: PERCUTANEOUS CORONARY STENT INTERVENTION (PCI-S);  Surgeon: Clent Demark, MD;  Location: [facility] CATH LAB;  Service: Cardiovascular;;        Home Medications     Prior to Admission medications   Medication Sig Start Date End Date Taking? Authorizing Provider  alum & mag hydroxide-simeth (MAALOX/MYLANTA) 200-200-20 MG/5ML suspension Take 30 mLs by mouth every 6 (six) hours as needed for indigestion or heartburn. 12/26/18   Charolette Forward, MD  aspirin EC 81 MG EC tablet Take 1 tablet (81 mg total) by mouth daily. 12/27/18   Charolette Forward, MD  clopidogrel (PLAVIX) 75 MG tablet Take 1 tablet (75 mg total) by mouth daily. 12/27/18   Charolette Forward, MD  DULoxetine (CYMBALTA) 20 MG capsule Take 1 capsule by mouth once daily 07/13/19   [name], MD  empagliflozin (JARDIANCE) 10 MG TABS tablet Take 1 tablet by mouth once daily 02/12/19   Kalman Shan Ratliff, DO  gabapentin (NEURONTIN) 300 MG capsule TAKE 2 CAPSULES BY MOUTH THREE TIMES DAILY 07/11/19   [name], MD  isosorbide mononitrate (IMDUR) 30 MG 24 hr tablet Take 1 tablet (30 mg total) by mouth daily. 07/13/19   [name], MD  lisinopril (ZESTRIL) 5 MG tablet Take 1 tablet (5 mg total) by mouth daily. 06/26/19   [name], MD  metFORMIN (GLUCOPHAGE) 1000 MG tablet Take 1 tablet (1,000 mg total) by mouth 2 (two) times daily with a meal. 12/28/18   Charolette Forward, MD  nitroGLYCERIN (NITROSTAT) 0.4 MG SL tablet DISSOLVE ONE TABLET UNDER THE TONGUE EVERY 5 MINUTES AS NEEDED FOR CHEST PAIN.  DO NOT EXCEED A TOTAL OF 3 DOSES IN 15 MINUTES 02/12/19   Hoffman, Jessica Ratliff, DO  pantoprazole (PROTONIX) 40 MG tablet Take 1 tablet (40 mg total) by mouth daily. 12/27/18   Charolette Forward, MD  rosuvastatin (CRESTOR) 40 MG tablet TAKE 1 TABLET BY MOUTH AT BEDTIME 02/12/19   Hoffman, Jessica Ratliff, DO  sotalol (BETAPACE) 80 MG tablet Take 0.5 tablets (40 mg total) by mouth 2 (two) times daily. Patient taking differently: Take 40 mg by mouth daily.  08/27/18   Valinda Party, DO    Family History Family History  Problem Relation Age of Onset  . Cancer Mother     Social History Social  History   Tobacco Use  . Smoking status: Former Smoker    Packs/day: 0.20    Years: 12.00    Pack years: 2.40    Types: Cigarettes    Quit date: 06/29/2018    Years since quitting: 1.1  . Smokeless tobacco: Never Used  Substance Use Topics  . Alcohol use: Yes    Alcohol/week: 0.0 standard drinks    Comment: socially  . Drug use: No    Comment: former marijuana     Allergies   Clindamycin/lincomycin   Review of Systems Review of Systems  Constitutional: Positive for chills and fever.  HENT: Negative for sore throat.   Respiratory: Positive for cough. Negative for shortness of breath.   Cardiovascular: Negative for chest pain.  Gastrointestinal: Negative for abdominal pain, nausea and vomiting.  Genitourinary: Negative for dysuria.  All other systems reviewed and are negative.    Physical Exam Updated Vital Signs BP (!) 107/58   Pulse 85   Temp (!)  101.1 F (38.4 C) (Oral)   Resp 20   Ht 1.88 m (6\' 2" )   Wt 108.9 kg   SpO2 96%   BMI 30.81 kg/m   Physical Exam Vitals signs and nursing note reviewed.  Constitutional:      Appearance: He is well-developed. He is not ill-appearing.  HENT:     Head: Normocephalic and atraumatic.     Mouth/Throat:     Mouth: Mucous membranes are moist.  Eyes:     Pupils: Pupils are equal, round, and reactive to light.  Neck:     Musculoskeletal: Neck supple.  Cardiovascular:     Rate and Rhythm: Normal rate and regular rhythm.     Heart sounds: Normal heart sounds. No murmur.  Pulmonary:     Effort: Pulmonary effort is normal. No respiratory distress.     Breath sounds: Normal breath sounds.  Abdominal:     General: Bowel sounds are normal.     Palpations: Abdomen is soft.     Tenderness: There is no abdominal tenderness. There is no rebound.  Musculoskeletal:        General: No swelling.  Lymphadenopathy:     Cervical: No cervical adenopathy.  Skin:    General: Skin is warm and dry.     Findings: No rash.   Neurological:     Mental Status: He is alert and oriented to person, place, and time.  Psychiatric:        Mood and Affect: Mood normal.      ED Treatments / Results  Labs (all labs ordered are listed, but only abnormal results are displayed) Labs Reviewed  CBC WITH DIFFERENTIAL/PLATELET - Abnormal; Notable for the following components:      Result Value   Platelets 104 (*)    All other components within normal limits  COMPREHENSIVE METABOLIC PANEL - Abnormal; Notable for the following components:   Sodium 132 (*)    CO2 21 (*)    Glucose, Bld 101 (*)    Calcium 8.8 (*)    Albumin 3.3 (*)    All other components within normal limits  URINALYSIS, ROUTINE W REFLEX MICROSCOPIC - Abnormal; Notable for the following components:   Glucose, UA >=500 (*)    Hgb urine dipstick LARGE (*)    Protein, ur >=300 (*)    Bacteria, UA RARE (*)    All other components within normal limits  CULTURE, BLOOD (ROUTINE X 2)  CULTURE, BLOOD (ROUTINE X 2)  LACTIC ACID, PLASMA    EKG None  Radiology Dg Chest Portable 1 View  Result Date: 08/17/2019 CLINICAL DATA:  Fever EXAM: PORTABLE CHEST 1 VIEW COMPARISON:  December 23, 2018 FINDINGS: The heart size and mediastinal contours are within normal limits. Both lungs are clear. The visualized skeletal structures are unremarkable. IMPRESSION: No active disease. Electronically Signed   By: Constance Holster M.D.   On: 08/17/2019 23:32    Procedures Procedures (including critical care time)  Medications Ordered in ED Medications  sodium chloride 0.9 % bolus 1,000 mL (has no administration in time range)  acetaminophen (TYLENOL) tablet 650 mg (has no administration in time range)     Initial Impression / Assessment and Plan / ED Course  I have reviewed the triage vital signs and the nursing notes.  Pertinent labs & imaging results that were available during my care of the patient were reviewed by me and considered in my medical decision making  (see chart for details).  Patient presents with chills and fever.  Received 3 vaccinations on Thursday morning.  Denies any other significant infectious symptoms.  When pressed, does report an occasional dry cough.  Denies any COVID exposures.  He is overall nontoxic-appearing.  Initial vital signs notable for temperature of 101.1.  Sepsis work-up was initiated in triage.  No leukocytosis.  Urinalysis shows blood but no evidence of UTI.  Patient reports history of hematuria.  Mild hyponatremia at 132.  Otherwise no significant metabolic derangements.  Lactate is normal.  Chest x-ray is reassuring.  He has no signs or symptoms of sepsis at this time.  This could be related to recent vaccinations.  However, early febrile illness is also consideration.  After fluids and Tylenol, patient states he feels much better.  I feel he is safe for discharge home.  We will hold antibiotics at this time as clinically I do not see a source of infection.  Make sure to stay hydrated and take Tylenol as needed for fevers.  He was given strict return precautions.  After history, exam, and medical workup I feel the patient has been appropriately medically screened and is safe for discharge home. Pertinent diagnoses were discussed with the patient. Patient was given return precautions.   Final Clinical Impressions(s) / ED Diagnoses   Final diagnoses:  Febrile illness    ED Discharge Orders    None       Abimael Zeiter, Barbette Hair, MD 08/18/19 [phone]

## 2019-08-22 DIAGNOSIS — G894 Chronic pain syndrome: Secondary | ICD-10-CM | POA: Diagnosis not present

## 2019-08-22 DIAGNOSIS — M5136 Other intervertebral disc degeneration, lumbar region: Secondary | ICD-10-CM | POA: Diagnosis not present

## 2019-08-22 DIAGNOSIS — M47816 Spondylosis without myelopathy or radiculopathy, lumbar region: Secondary | ICD-10-CM | POA: Diagnosis not present

## 2019-08-22 DIAGNOSIS — Z79891 Long term (current) use of opiate analgesic: Secondary | ICD-10-CM | POA: Diagnosis not present

## 2019-08-23 LAB — CULTURE, BLOOD (ROUTINE X 2)
Culture: NO GROWTH
Culture: NO GROWTH
Special Requests: ADEQUATE

## 2019-08-29 ENCOUNTER — Other Ambulatory Visit: Payer: Self-pay | Admitting: *Deleted

## 2019-08-29 MED ORDER — EMPAGLIFLOZIN 10 MG PO TABS: 10.0000 mg | ORAL_TABLET | Freq: Every day | ORAL | 1 refills | Status: DC

## 2019-09-05 ENCOUNTER — Telehealth: Payer: Self-pay | Admitting: *Deleted

## 2019-09-05 ENCOUNTER — Other Ambulatory Visit: Payer: Self-pay | Admitting: *Deleted

## 2019-09-05 DIAGNOSIS — E1121 Type 2 diabetes mellitus with diabetic nephropathy: Secondary | ICD-10-CM

## 2019-09-05 MED ORDER — LISINOPRIL 5 MG PO TABS: 5.0000 mg | ORAL_TABLET | Freq: Every day | ORAL | 1 refills | Status: DC

## 2019-09-08 ENCOUNTER — Other Ambulatory Visit: Payer: Self-pay | Admitting: Internal Medicine

## 2019-09-08 DIAGNOSIS — M47817 Spondylosis without myelopathy or radiculopathy, lumbosacral region: Secondary | ICD-10-CM

## 2019-09-12 ENCOUNTER — Other Ambulatory Visit: Payer: Self-pay | Admitting: *Deleted

## 2019-09-12 MED ORDER — SOTALOL HCL 80 MG PO TABS: 40.0000 mg | ORAL_TABLET | Freq: Two times a day (BID) | ORAL | 0 refills | Status: DC

## 2019-09-12 NOTE — Telephone Encounter (Signed)
Next appt scheduled 11/5 with PCP. 

## 2019-10-04 ENCOUNTER — Ambulatory Visit (INDEPENDENT_AMBULATORY_CARE_PROVIDER_SITE_OTHER): Payer: Medicare HMO | Admitting: Internal Medicine

## 2019-10-04 ENCOUNTER — Other Ambulatory Visit: Payer: Self-pay

## 2019-10-04 ENCOUNTER — Encounter: Payer: Self-pay | Admitting: Internal Medicine

## 2019-10-04 VITALS — BP 143/78 | HR 69 | Temp 98.0°F | Ht 75.0 in | Wt 246.4 lb

## 2019-10-04 DIAGNOSIS — Z7982 Long term (current) use of aspirin: Secondary | ICD-10-CM

## 2019-10-04 DIAGNOSIS — G8929 Other chronic pain: Secondary | ICD-10-CM | POA: Diagnosis not present

## 2019-10-04 DIAGNOSIS — Z7984 Long term (current) use of oral hypoglycemic drugs: Secondary | ICD-10-CM

## 2019-10-04 DIAGNOSIS — I2510 Atherosclerotic heart disease of native coronary artery without angina pectoris: Secondary | ICD-10-CM | POA: Diagnosis not present

## 2019-10-04 DIAGNOSIS — E119 Type 2 diabetes mellitus without complications: Secondary | ICD-10-CM

## 2019-10-04 DIAGNOSIS — Z8679 Personal history of other diseases of the circulatory system: Secondary | ICD-10-CM

## 2019-10-04 DIAGNOSIS — I10 Essential (primary) hypertension: Secondary | ICD-10-CM | POA: Diagnosis not present

## 2019-10-04 DIAGNOSIS — M545 Low back pain: Secondary | ICD-10-CM

## 2019-10-04 DIAGNOSIS — M5416 Radiculopathy, lumbar region: Secondary | ICD-10-CM

## 2019-10-04 DIAGNOSIS — I252 Old myocardial infarction: Secondary | ICD-10-CM | POA: Diagnosis not present

## 2019-10-04 DIAGNOSIS — R310 Gross hematuria: Secondary | ICD-10-CM | POA: Diagnosis not present

## 2019-10-04 DIAGNOSIS — Z79899 Other long term (current) drug therapy: Secondary | ICD-10-CM

## 2019-10-04 DIAGNOSIS — R3129 Other microscopic hematuria: Secondary | ICD-10-CM | POA: Insufficient documentation

## 2019-10-04 DIAGNOSIS — E1121 Type 2 diabetes mellitus with diabetic nephropathy: Secondary | ICD-10-CM | POA: Diagnosis not present

## 2019-10-04 DIAGNOSIS — K921 Melena: Secondary | ICD-10-CM | POA: Diagnosis not present

## 2019-10-04 LAB — CBC
HCT: 42 % (ref 39.0–52.0)
Hemoglobin: 13.6 g/dL (ref 13.0–17.0)
MCH: 28.6 pg (ref 26.0–34.0)
MCHC: 32.4 g/dL (ref 30.0–36.0)
MCV: 88.2 fL (ref 80.0–100.0)
Platelets: 186 10*3/uL (ref 150–400)
RBC: 4.76 MIL/uL (ref 4.22–5.81)
RDW: 15.2 % (ref 11.5–15.5)
WBC: 5.6 10*3/uL (ref 4.0–10.5)
nRBC: 0 % (ref 0.0–0.2)

## 2019-10-04 LAB — BASIC METABOLIC PANEL
Anion gap: 9 (ref 5–15)
BUN: 14 mg/dL (ref 8–23)
CO2: 25 mmol/L (ref 22–32)
Calcium: 9.3 mg/dL (ref 8.9–10.3)
Chloride: 107 mmol/L (ref 98–111)
Creatinine, Ser: 0.88 mg/dL (ref 0.61–1.24)
GFR calc Af Amer: >60 mL/min (ref >60)
GFR calc non Af Amer: >60 mL/min (ref >60)
Glucose, Bld: 112 mg/dL — ABNORMAL HIGH (ref 70–99)
Potassium: 4.3 mmol/L (ref 3.5–5.1)
Sodium: 141 mmol/L (ref 135–145)

## 2019-10-04 LAB — POCT URINALYSIS DIPSTICK
Bilirubin, UA: NEGATIVE
Glucose, UA: NEGATIVE
Ketones, UA: NEGATIVE
Leukocytes, UA: NEGATIVE
Nitrite, UA: NEGATIVE
Protein, UA: POSITIVE — AB
Spec Grav, UA: 1.02 (ref 1.010–1.025)
Urobilinogen, UA: 0.2 E.U./dL
pH, UA: 5.5 (ref 5.0–8.0)

## 2019-10-04 LAB — POCT GLYCOSYLATED HEMOGLOBIN (HGB A1C): Hemoglobin A1C: 6.3 % — AB (ref 4.0–5.6)

## 2019-10-04 LAB — GLUCOSE, CAPILLARY: Glucose-Capillary: 160 mg/dL — ABNORMAL HIGH (ref 70–99)

## 2019-10-04 MED ORDER — GABAPENTIN 600 MG PO TABS: 600.0000 mg | ORAL_TABLET | Freq: Three times a day (TID) | ORAL | 3 refills | Status: DC

## 2019-10-04 NOTE — Progress Notes (Signed)
6.3 

## 2019-10-04 NOTE — Patient Instructions (Addendum)
[name],  It was a pleasure meeting you today. Today we discussed your diabetes, back pain, atrial fibrillation and your urine tests. We will increase your gabapentin for your back pain, and please continue to take your aspirin. We will follow up in three months to recheck your blood work. Please reach out to [place] before if you experience any new symptoms, or notice a difference in your general state. Please pick up your prescription at your pharmacy. I look forward to working with you next time.  Sincerely,  [name], MD

## 2019-10-04 NOTE — Assessment & Plan Note (Addendum)
Patient comes to the clinic with complaints of hematuria. Per the patient this has been an ongoing issue for a while. He has had a cystoscopy which showed no signs of masses. His Xarelto and aspirin were discontinued for a brief while by his cardiologist. He was then started back on his aspirin, which he takes daily. According to the patient his cardiologist states that he should continue to with hold his Xarelto, even with a PMH of a. Fib. We discussed that if the patient should have gross hematuria that he should immediately reach out to his urologist. He was also instructed to keep following up with his urologist and cardiologist. Patient voiced understanding.  - U/A showed "small" amount of blood.  - Urinalysis dipstick test ordered.  - Continue to monitor - Patient instructed to reach out to urologist if he has another gross hematuria episode.

## 2019-10-04 NOTE — Progress Notes (Signed)
CC: Diabetes Management and Back Pain  HPI:  [name] is a 63 y.o. , with a PMH of HTN, NSTEMI, DMII, and CAD who presents to the clinic with concerns over his diabetes medication and back pain. To see the management of the patients acute and chronic conditions, please go to the assessment and plan under the encounters tab.   Past Medical History:  Diagnosis Date  . Atrial arrhythmia   . CAP (community acquired pneumonia)   . Cellulitis of knee, right 06/06/12  . Chronic otitis externa 06/06/12   C Multiple trials with antibiocs and antifungal. Cultures were positive for Pseudomonas. Follow up with ENT on 06/14/12    . CORONARY ARTERY DISEASE 11/25/2006   Cardiac cath by Dr Terrence Dupont 11/2003 LV showed good LV systolic function, EF of 0000000. Left main was patent. LAD has 20-30% mid stenosis. Diagonal 1 and diagonal 2 were patent. Left circumflex was patent. OM1 was less than 0.5 mm which was diffusely diseased. OM2 has 20% ostial stenosis which was patent which  was moderate size. OM3 was patent at prior PTCA and stented site. RCA has 20-30% proximal   . Hypertension   . NSTEMI (non-ST elevated myocardial infarction) ([place]) 08/14/2012   S/p Successful PTCA to proximal OM-3     . TOBACCO ABUSE 11/25/2006  . Type II diabetes mellitus ([place])    Review of Systems:   Review of Systems  Constitutional: Negative for chills and fever.  HENT: Negative for hearing loss.   Eyes: Negative for blurred vision, double vision, photophobia and pain.  Respiratory: Negative for cough and shortness of breath.   Cardiovascular: Negative for chest pain and palpitations.  Gastrointestinal: Positive for blood in stool. Negative for abdominal pain, constipation, diarrhea, heartburn, nausea and vomiting.       Patient states that he does have occasional blood in stool.  Genitourinary: Positive for hematuria. Negative for dysuria, frequency and urgency.       Patient states that sometimes he notices that his  urine has a red ting to it.   Musculoskeletal: Positive for back pain. Negative for myalgias and neck pain.       Patient does mention having back pain that is worse than usual. He denies fecal incontinence, urinary retention, acute spinal process pain, or other symptoms of spinal cord compression.   Neurological: Negative for dizziness.    Physical Exam:  Vitals:   10/04/19 0933  BP: (!) 143/78  Pulse: 69  Temp: 98 F (36.7 C)  TempSrc: Oral  SpO2: 100%  Weight: 246 lb 6.4 oz (111.8 kg)  Height: 6\' 3"  (1.905 m)   Physical Exam Constitutional:      General: He is not in acute distress.    Appearance: Normal appearance. He is not ill-appearing or toxic-appearing.  HENT:     Head: Normocephalic and atraumatic.  Cardiovascular:     Rate and Rhythm: Normal rate and regular rhythm.     Pulses: Normal pulses.     Heart sounds: Normal heart sounds. No murmur. No friction rub. No gallop.   Pulmonary:     Effort: Pulmonary effort is normal. No respiratory distress.     Breath sounds: Normal breath sounds. No wheezing, rhonchi or rales.  Abdominal:     General: Abdomen is flat. Bowel sounds are normal.     Palpations: Abdomen is soft.     Tenderness: There is no abdominal tenderness. There is no guarding.  Musculoskeletal:  General: No swelling or tenderness.     Right lower leg: No edema.     Left lower leg: No edema.  Skin:    General: Skin is warm.     Coloration: Skin is not jaundiced.     Findings: No bruising.  Neurological:     General: No focal deficit present.     Mental Status: He is alert and oriented to person, place, and time.  Psychiatric:        Mood and Affect: Mood normal.        Behavior: Behavior normal.     Assessment & Plan:   See Encounters Tab for problem based charting.  Patient seen with Dr. Dareen Piano

## 2019-10-04 NOTE — Assessment & Plan Note (Addendum)
Patient presents today with questions concerning his metformin medications and follow up on his diabetes management. We discussed that his medication does not appear to be a part of the recall on metformin. We discussed that his pharmacy will contact him if he has had medication from the contaminated lot. His Hemoglobin A1C was 6.3.  - Will continue to monitor Diabetes management. - Diabetic foot exam preformed. - Continue current medical regime in light of HbgA1C of 6.3. - BMP ordered to assess renal function.  - Microalbumin/creatinine ratio.  - Capillary glucose ordered.

## 2019-10-04 NOTE — Assessment & Plan Note (Addendum)
Patient presents to the clinic for his chronic back pain with radiculopathy along the right lower extremity. He states that his increased pain has been continuing for several weeks. He states that he has had therapy for his back pain, and still exercises with little relief. He denies acute back pain, urinary retention, or fecal incontinence. He is currently on 300 mg gabapentin TID. We will increase his gabapentin. Patient was warned about the side effects of drowsiness and dizziness with instructions to stop taking the increased dose if he becomes symptomatic. - Increase gabapentin to 600 mg TID

## 2019-10-05 LAB — MICROALBUMIN / CREATININE URINE RATIO
Creatinine, Urine: 53.5 mg/dL
Microalb/Creat Ratio: 1035 mg/g creat — ABNORMAL HIGH (ref 0–29)
Microalbumin, Urine: 553.6 ug/mL

## 2019-10-05 NOTE — Progress Notes (Signed)
Internal Medicine Clinic Attending  I saw and evaluated the patient.  I personally confirmed the key portions of the history and exam documented by Dr. Winters and I reviewed pertinent patient test results.  The assessment, diagnosis, and plan were formulated together and I agree with the documentation in the resident's note.  

## 2019-10-10 ENCOUNTER — Telehealth: Payer: Self-pay | Admitting: *Deleted

## 2019-10-10 NOTE — Telephone Encounter (Signed)
Received fax request from [facility] for refill on glipizide 10 mg daily. No longer on current med list. Clinical Summary from [facility] received 10/04/2019 has this on his med list. Please advise. Hubbard Hartshorn, BSN, RN-BC

## 2019-10-11 ENCOUNTER — Other Ambulatory Visit: Payer: Self-pay | Admitting: Internal Medicine

## 2019-10-11 DIAGNOSIS — M47817 Spondylosis without myelopathy or radiculopathy, lumbosacral region: Secondary | ICD-10-CM

## 2019-10-12 DIAGNOSIS — E785 Hyperlipidemia, unspecified: Secondary | ICD-10-CM | POA: Diagnosis not present

## 2019-10-12 DIAGNOSIS — E119 Type 2 diabetes mellitus without complications: Secondary | ICD-10-CM | POA: Diagnosis not present

## 2019-10-12 DIAGNOSIS — I2510 Atherosclerotic heart disease of native coronary artery without angina pectoris: Secondary | ICD-10-CM | POA: Diagnosis not present

## 2019-10-12 DIAGNOSIS — I10 Essential (primary) hypertension: Secondary | ICD-10-CM | POA: Diagnosis not present

## 2019-10-12 DIAGNOSIS — I480 Paroxysmal atrial fibrillation: Secondary | ICD-10-CM | POA: Diagnosis not present

## 2019-10-12 DIAGNOSIS — M1990 Unspecified osteoarthritis, unspecified site: Secondary | ICD-10-CM | POA: Diagnosis not present

## 2019-10-15 NOTE — Telephone Encounter (Signed)
To all,   I hope you are having a pleasant day. During our visit, and further note review patient is currently taking Jardiance and metformin. His glipizide has been discontinued. Thank you for reaching out! Sincerely,  [name], MD

## 2019-10-17 ENCOUNTER — Other Ambulatory Visit: Payer: Self-pay | Admitting: Internal Medicine

## 2019-10-17 DIAGNOSIS — G894 Chronic pain syndrome: Secondary | ICD-10-CM | POA: Diagnosis not present

## 2019-10-17 DIAGNOSIS — Z79891 Long term (current) use of opiate analgesic: Secondary | ICD-10-CM | POA: Diagnosis not present

## 2019-10-17 DIAGNOSIS — M5136 Other intervertebral disc degeneration, lumbar region: Secondary | ICD-10-CM | POA: Diagnosis not present

## 2019-10-17 DIAGNOSIS — M47817 Spondylosis without myelopathy or radiculopathy, lumbosacral region: Secondary | ICD-10-CM

## 2019-10-17 DIAGNOSIS — M47816 Spondylosis without myelopathy or radiculopathy, lumbar region: Secondary | ICD-10-CM | POA: Diagnosis not present

## 2019-11-08 DIAGNOSIS — H5203 Hypermetropia, bilateral: Secondary | ICD-10-CM | POA: Diagnosis not present

## 2019-11-08 DIAGNOSIS — H52223 Regular astigmatism, bilateral: Secondary | ICD-10-CM | POA: Diagnosis not present

## 2019-11-08 DIAGNOSIS — E119 Type 2 diabetes mellitus without complications: Secondary | ICD-10-CM | POA: Diagnosis not present

## 2019-11-08 DIAGNOSIS — H25813 Combined forms of age-related cataract, bilateral: Secondary | ICD-10-CM | POA: Diagnosis not present

## 2019-12-10 DIAGNOSIS — Z79891 Long term (current) use of opiate analgesic: Secondary | ICD-10-CM | POA: Diagnosis not present

## 2019-12-10 DIAGNOSIS — M47816 Spondylosis without myelopathy or radiculopathy, lumbar region: Secondary | ICD-10-CM | POA: Diagnosis not present

## 2019-12-10 DIAGNOSIS — G894 Chronic pain syndrome: Secondary | ICD-10-CM | POA: Diagnosis not present

## 2019-12-10 DIAGNOSIS — M5136 Other intervertebral disc degeneration, lumbar region: Secondary | ICD-10-CM | POA: Diagnosis not present

## 2019-12-11 DIAGNOSIS — G894 Chronic pain syndrome: Secondary | ICD-10-CM | POA: Diagnosis not present

## 2019-12-11 DIAGNOSIS — Z79891 Long term (current) use of opiate analgesic: Secondary | ICD-10-CM | POA: Diagnosis not present

## 2019-12-11 DIAGNOSIS — M5136 Other intervertebral disc degeneration, lumbar region: Secondary | ICD-10-CM | POA: Diagnosis not present

## 2019-12-11 DIAGNOSIS — M47816 Spondylosis without myelopathy or radiculopathy, lumbar region: Secondary | ICD-10-CM | POA: Diagnosis not present

## 2019-12-17 DIAGNOSIS — Z79891 Long term (current) use of opiate analgesic: Secondary | ICD-10-CM | POA: Diagnosis not present

## 2019-12-24 ENCOUNTER — Other Ambulatory Visit: Payer: Self-pay | Admitting: Internal Medicine

## 2020-01-11 DIAGNOSIS — I480 Paroxysmal atrial fibrillation: Secondary | ICD-10-CM | POA: Diagnosis not present

## 2020-01-11 DIAGNOSIS — I2510 Atherosclerotic heart disease of native coronary artery without angina pectoris: Secondary | ICD-10-CM | POA: Diagnosis not present

## 2020-01-11 DIAGNOSIS — M1990 Unspecified osteoarthritis, unspecified site: Secondary | ICD-10-CM | POA: Diagnosis not present

## 2020-01-11 DIAGNOSIS — E119 Type 2 diabetes mellitus without complications: Secondary | ICD-10-CM | POA: Diagnosis not present

## 2020-01-11 DIAGNOSIS — I10 Essential (primary) hypertension: Secondary | ICD-10-CM | POA: Diagnosis not present

## 2020-01-11 DIAGNOSIS — E785 Hyperlipidemia, unspecified: Secondary | ICD-10-CM | POA: Diagnosis not present

## 2020-01-21 ENCOUNTER — Other Ambulatory Visit: Payer: Self-pay

## 2020-01-22 MED ORDER — ROSUVASTATIN CALCIUM 40 MG PO TABS: 40.0000 mg | ORAL_TABLET | Freq: Every day | ORAL | 1 refills | Status: DC

## 2020-02-08 ENCOUNTER — Ambulatory Visit: Payer: Medicare HMO | Attending: Internal Medicine

## 2020-02-08 DIAGNOSIS — Z23 Encounter for immunization: Secondary | ICD-10-CM

## 2020-02-08 NOTE — Progress Notes (Signed)
   Covid-19 Vaccination Clinic  Name:  BOISE FEHRINGER    MRN: [id number] DOB: Sep 23, 1956  02/08/2020  Mr. Roel was observed post Covid-19 immunization for 15 minutes without incident. He was provided with Vaccine Information Sheet and instruction to access the V-Safe system.   Mr. Tippens was instructed to call 911 with any severe reactions post vaccine: Marland Kitchen Difficulty breathing  . Swelling of face and throat  . A fast heartbeat  . A bad rash all over body  . Dizziness and weakness   Immunizations Administered    Name Date Dose VIS Date Route   Pfizer COVID-19 Vaccine 02/08/2020  9:43 AM 0.3 mL 11/09/2019 Intramuscular   Manufacturer: [place]   Lot: [id number]   [place]: [id number]

## 2020-02-24 ENCOUNTER — Other Ambulatory Visit: Payer: Self-pay | Admitting: Internal Medicine

## 2020-02-24 DIAGNOSIS — E1121 Type 2 diabetes mellitus with diabetic nephropathy: Secondary | ICD-10-CM

## 2020-03-03 ENCOUNTER — Ambulatory Visit: Payer: Medicare HMO | Attending: Internal Medicine

## 2020-03-03 DIAGNOSIS — Z23 Encounter for immunization: Secondary | ICD-10-CM

## 2020-03-03 NOTE — Progress Notes (Signed)
   Covid-19 Vaccination Clinic  Name:  [name]    MRN: [id number] DOB: [birth year]  03/03/2020  [name] was observed post Covid-19 immunization for 15 minutes without incident. He was provided with Vaccine Information Sheet and instruction to access the V-Safe system.   [name] was instructed to call 911 with any severe reactions post vaccine: Marland Kitchen Difficulty breathing  . Swelling of face and throat  . A fast heartbeat  . A bad rash all over body  . Dizziness and weakness   Immunizations Administered    Name Date Dose VIS Date Route   Pfizer COVID-19 Vaccine 03/03/2020 11:50 AM 0.3 mL 11/09/2019 Intramuscular   Manufacturer: [place]   Lot: [id number]   [place]: [id number]

## 2020-04-11 DIAGNOSIS — E119 Type 2 diabetes mellitus without complications: Secondary | ICD-10-CM | POA: Diagnosis not present

## 2020-04-11 DIAGNOSIS — I10 Essential (primary) hypertension: Secondary | ICD-10-CM | POA: Diagnosis not present

## 2020-04-11 DIAGNOSIS — E785 Hyperlipidemia, unspecified: Secondary | ICD-10-CM | POA: Diagnosis not present

## 2020-04-11 DIAGNOSIS — M1990 Unspecified osteoarthritis, unspecified site: Secondary | ICD-10-CM | POA: Diagnosis not present

## 2020-04-11 DIAGNOSIS — I480 Paroxysmal atrial fibrillation: Secondary | ICD-10-CM | POA: Diagnosis not present

## 2020-04-11 DIAGNOSIS — I2510 Atherosclerotic heart disease of native coronary artery without angina pectoris: Secondary | ICD-10-CM | POA: Diagnosis not present

## 2020-04-16 ENCOUNTER — Other Ambulatory Visit: Payer: Self-pay | Admitting: Internal Medicine

## 2020-04-16 DIAGNOSIS — E1121 Type 2 diabetes mellitus with diabetic nephropathy: Secondary | ICD-10-CM

## 2020-04-16 NOTE — Telephone Encounter (Signed)
Rx request denied.  Patient no longer under provider's care.//AB/CMA

## 2020-05-01 ENCOUNTER — Other Ambulatory Visit: Payer: Self-pay | Admitting: Internal Medicine

## 2020-05-02 ENCOUNTER — Telehealth: Payer: Self-pay | Admitting: *Deleted

## 2020-05-02 NOTE — Telephone Encounter (Signed)
Rx's denied-Patient is no longer under providers care.//AB/CMA

## 2020-05-19 ENCOUNTER — Other Ambulatory Visit: Payer: Self-pay | Admitting: Internal Medicine

## 2020-06-11 ENCOUNTER — Other Ambulatory Visit: Payer: Self-pay | Admitting: Internal Medicine

## 2020-06-14 ENCOUNTER — Other Ambulatory Visit: Payer: Self-pay | Admitting: Internal Medicine

## 2020-06-16 ENCOUNTER — Other Ambulatory Visit: Payer: Self-pay | Admitting: Internal Medicine

## 2020-06-16 NOTE — Telephone Encounter (Signed)
Rx denied.  Patient no longer under providers care.//AB/CMA °

## 2020-07-11 ENCOUNTER — Other Ambulatory Visit: Payer: Self-pay | Admitting: Internal Medicine

## 2020-07-11 DIAGNOSIS — I10 Essential (primary) hypertension: Secondary | ICD-10-CM | POA: Diagnosis not present

## 2020-07-11 DIAGNOSIS — M5416 Radiculopathy, lumbar region: Secondary | ICD-10-CM

## 2020-07-11 DIAGNOSIS — G8929 Other chronic pain: Secondary | ICD-10-CM

## 2020-07-11 DIAGNOSIS — E119 Type 2 diabetes mellitus without complications: Secondary | ICD-10-CM | POA: Diagnosis not present

## 2020-07-11 DIAGNOSIS — E785 Hyperlipidemia, unspecified: Secondary | ICD-10-CM | POA: Diagnosis not present

## 2020-07-11 DIAGNOSIS — I4891 Unspecified atrial fibrillation: Secondary | ICD-10-CM | POA: Diagnosis not present

## 2020-07-11 DIAGNOSIS — I2510 Atherosclerotic heart disease of native coronary artery without angina pectoris: Secondary | ICD-10-CM | POA: Diagnosis not present

## 2020-07-14 NOTE — Telephone Encounter (Signed)
Pt appt is on 07/30/2020 @ 1:45.

## 2020-07-22 ENCOUNTER — Other Ambulatory Visit: Payer: Self-pay | Admitting: Internal Medicine

## 2020-07-22 DIAGNOSIS — G8929 Other chronic pain: Secondary | ICD-10-CM

## 2020-07-23 ENCOUNTER — Other Ambulatory Visit: Payer: Self-pay | Admitting: Internal Medicine

## 2020-07-23 DIAGNOSIS — G8929 Other chronic pain: Secondary | ICD-10-CM

## 2020-07-24 ENCOUNTER — Other Ambulatory Visit: Payer: Self-pay | Admitting: Internal Medicine

## 2020-07-31 ENCOUNTER — Encounter: Payer: Medicare HMO | Admitting: Internal Medicine

## 2020-08-06 ENCOUNTER — Ambulatory Visit (HOSPITAL_COMMUNITY)
Admission: RE | Admit: 2020-08-06 | Discharge: 2020-08-06 | Disposition: A | Discharge status: Home / Self Care | Payer: Medicare Other | Source: Ambulatory Visit | Attending: Internal Medicine | Admitting: Internal Medicine

## 2020-08-06 ENCOUNTER — Encounter: Payer: Self-pay | Admitting: Internal Medicine

## 2020-08-06 ENCOUNTER — Other Ambulatory Visit: Payer: Self-pay

## 2020-08-06 ENCOUNTER — Ambulatory Visit (INDEPENDENT_AMBULATORY_CARE_PROVIDER_SITE_OTHER): Payer: Medicare Other | Admitting: Internal Medicine

## 2020-08-06 VITALS — BP 131/73 | HR 71 | Temp 98.6°F

## 2020-08-06 DIAGNOSIS — E119 Type 2 diabetes mellitus without complications: Secondary | ICD-10-CM | POA: Diagnosis not present

## 2020-08-06 DIAGNOSIS — M79642 Pain in left hand: Secondary | ICD-10-CM | POA: Diagnosis not present

## 2020-08-06 DIAGNOSIS — S62115A Nondisplaced fracture of triquetrum [cuneiform] bone, left wrist, initial encounter for closed fracture: Secondary | ICD-10-CM | POA: Insufficient documentation

## 2020-08-06 DIAGNOSIS — E785 Hyperlipidemia, unspecified: Secondary | ICD-10-CM

## 2020-08-06 DIAGNOSIS — K219 Gastro-esophageal reflux disease without esophagitis: Secondary | ICD-10-CM | POA: Diagnosis not present

## 2020-08-06 DIAGNOSIS — R600 Localized edema: Secondary | ICD-10-CM | POA: Diagnosis not present

## 2020-08-06 DIAGNOSIS — W01198A Fall on same level from slipping, tripping and stumbling with subsequent striking against other object, initial encounter: Secondary | ICD-10-CM

## 2020-08-06 DIAGNOSIS — Z23 Encounter for immunization: Secondary | ICD-10-CM | POA: Diagnosis not present

## 2020-08-06 DIAGNOSIS — M25532 Pain in left wrist: Secondary | ICD-10-CM | POA: Diagnosis not present

## 2020-08-06 DIAGNOSIS — E1121 Type 2 diabetes mellitus with diabetic nephropathy: Secondary | ICD-10-CM

## 2020-08-06 DIAGNOSIS — Z7984 Long term (current) use of oral hypoglycemic drugs: Secondary | ICD-10-CM

## 2020-08-06 LAB — POCT GLYCOSYLATED HEMOGLOBIN (HGB A1C): Hemoglobin A1C: 7.4 % — AB (ref 4.0–5.6)

## 2020-08-06 LAB — GLUCOSE, CAPILLARY: Glucose-Capillary: 133 mg/dL — ABNORMAL HIGH (ref 70–99)

## 2020-08-06 MED ORDER — ROSUVASTATIN CALCIUM 40 MG PO TABS: 40.0000 mg | ORAL_TABLET | Freq: Every day | ORAL | 3 refills | Status: DC

## 2020-08-06 MED ORDER — PANTOPRAZOLE SODIUM 40 MG PO TBEC: 40.0000 mg | DELAYED_RELEASE_TABLET | Freq: Every day | ORAL | 3 refills | Status: DC

## 2020-08-06 NOTE — Patient Instructions (Addendum)
Thank you for trusting me with your care. To recap, today we discussed the following:   1. Controlled type 2 diabetes mellitus with diabetic nephropathy, without long-term current use of insulin (HCC)  - POC Hbg A1C, 7.4 today. Up from 6.2  Continue to work on diet and we will evaluate in 3 months  2. Left hand pain  Xrays . I will call with results. Try rest, ice, compression , and elevation. ( RICE)   Hand Complete Left  Wrist Complete Left

## 2020-08-06 NOTE — Progress Notes (Signed)
   CC: left hand pain after a fall and type 2 diabetes mellitus   HPI:[name] is a 64 y.o. male who presents for evaluation of left hand pain after a fall and type 2 diabetes mellitus. Please see individual problem based A/P for details.  Past Medical History:  Diagnosis Date  . Atrial arrhythmia   . CAP (community acquired pneumonia)   . Cellulitis of knee, right 06/06/12  . Chronic otitis externa 06/06/12   C Multiple trials with antibiocs and antifungal. Cultures were positive for Pseudomonas. Follow up with ENT on 06/14/12    . CORONARY ARTERY DISEASE 11/25/2006   Cardiac cath by Dr Terrence Dupont 11/2003 LV showed good LV systolic function, EF of 67-34%. Left main was patent. LAD has 20-30% mid stenosis. Diagonal 1 and diagonal 2 were patent. Left circumflex was patent. OM1 was less than 0.5 mm which was diffusely diseased. OM2 has 20% ostial stenosis which was patent which  was moderate size. OM3 was patent at prior PTCA and stented site. RCA has 20-30% proximal   . Hypertension   . NSTEMI (non-ST elevated myocardial infarction) ([place]) 08/14/2012   S/p Successful PTCA to proximal OM-3     . TOBACCO ABUSE 11/25/2006  . Type II diabetes mellitus ([place])    Review of Systems:   Review of Systems  Constitutional: Negative for chills and fever.  Musculoskeletal: Positive for falls.  Neurological: Negative for dizziness and headaches.     Physical Exam: Vitals:   08/06/20 1351  BP: 131/73  Pulse: 71  Temp: 98.6 F (37 C)  TempSrc: Oral  SpO2: 98%    General: NAD, nl appearance HEENT: Normocephalic, atraumatic , Conjunctiva nl  Cardiovascular: Normal rate, regular rhythm.  No murmurs, rubs, or gallops Pulmonary : Equal breath sounds, No wheezes, rales, or rhonchi Abdominal: soft, nontender,  bowel sounds present Ext: Pain on ulnar side of left hand, sensation intact, 5/5 strength in hand   Assessment & Plan:   See Encounters Tab for problem based charting.  Patient discussed  with Dr. Daryll Drown

## 2020-08-07 ENCOUNTER — Encounter: Payer: Self-pay | Admitting: Internal Medicine

## 2020-08-07 ENCOUNTER — Telehealth: Payer: Self-pay | Admitting: *Deleted

## 2020-08-07 NOTE — Assessment & Plan Note (Signed)
-   Refilled Protonix 40 mg

## 2020-08-07 NOTE — Assessment & Plan Note (Signed)
DMII: Hgb A1c 6.3 % at the last visit. Current A1c 7.4 %. The patient denied polyuria, polydipsia, and fatigue.   Plan: Patient plans on eating a healthier diet, said this was likely cause of recent elevation. No changes today to regimen.  Continue Metformin 1000 mg BID Continue Jardiance 10 mg daily Repeat A1c at next visit if after 3 months from this date

## 2020-08-07 NOTE — Assessment & Plan Note (Signed)
Refilled Rosuvastatin 40 mg daily

## 2020-08-07 NOTE — Telephone Encounter (Signed)
RTC to patient, informed Dr. Court Joy placed referral to orthopedics for evaluation and he does not need to be seen in our clinic.  Pt insisted that MD told him this morning he needs to come to [facility] to have his hand wrapped.  RN read Dr. Lonzo Candy message to patient and explained to patient that Dr. Court Joy would like Ortho to evaluate and decide on treatment.  He verbalized understanding. SChaplin, RN,BSN

## 2020-08-07 NOTE — Telephone Encounter (Signed)
Patient was referred to orthopedic surgery for further evaluation of hand and consideration of cast.  He does not need to be seen in our clinic for this problem. I shared this with Mr.Ausburn this morning.

## 2020-08-07 NOTE — Progress Notes (Signed)
Patient made aware of hand fracture seen on xray and Hemoglobin A1c.

## 2020-08-07 NOTE — Telephone Encounter (Signed)
Please see telephone note below.   Forwarding to Dr. Court Joy and blue team to advise if patient needs to present to [facility] for anything on Monday?  Referral to orthopedic surgery noted from today. Thank you, SChaplin, RN,BSN

## 2020-08-07 NOTE — Telephone Encounter (Signed)
Patient called stating a doctor from here called and told him his hand was fractured and to come in here on Monday to get his hand wrapped.

## 2020-08-08 ENCOUNTER — Other Ambulatory Visit: Payer: Self-pay

## 2020-08-08 ENCOUNTER — Encounter: Payer: Self-pay | Admitting: Family Medicine

## 2020-08-08 ENCOUNTER — Ambulatory Visit (INDEPENDENT_AMBULATORY_CARE_PROVIDER_SITE_OTHER): Payer: Medicare Other | Admitting: Family Medicine

## 2020-08-08 DIAGNOSIS — S62115A Nondisplaced fracture of triquetrum [cuneiform] bone, left wrist, initial encounter for closed fracture: Secondary | ICD-10-CM | POA: Diagnosis not present

## 2020-08-08 MED ORDER — TRAMADOL HCL 50 MG PO TABS: 50.0000 mg | ORAL_TABLET | Freq: Four times a day (QID) | ORAL | 0 refills | Status: DC | PRN

## 2020-08-08 NOTE — Progress Notes (Signed)
Office Visit Note   Patient: [name]           Date of Birth: [birth year]           MRN: [id number] Visit Date: 08/08/2020 Requested by: [name], MD [address] [place],  [place] [address] PCP: [name], MD  Subjective: Chief Complaint  Patient presents with  . Left Wrist - Pain    Fell one week ago, onto the left wrist - pain in ulnar side of wrist and into the hand. Right-hand dominant. Tylenol is not touching the pain.    HPI: He is here with left wrist fracture.  He is right-hand dominant.  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  Immediate pain on the ulnar side of his wrist.  He went to urgent care where x-rays revealed a nondisplaced triquetrum fracture.  He was not given any sort of splint.  He continues to have quite a bit of pain.  No previous fractures, he is otherwise been in good health.  He works as a Curator.               ROS: He has diabetes which has been in good control.  He is a non-smoker.  All other systems were reviewed and are negative.  Objective: Vital Signs: There were no vitals taken for this visit.  Physical Exam:  General:  Alert and oriented, in no acute distress. Pulm:  Breathing unlabored. Psy:  Normal mood, congruent affect. Skin: No bruising. Left wrist: He has some soft tissue swelling on the dorsal and ulnar side of his wrist.  He has pain with extension and ulnar deviation.  He is very tender to palpation on the ulnar side of his wrist over the triquetrum.  Also has some tenderness over the scapholunate area.  He is neurovascularly intact.  Imaging: None today, but recent x-rays from urgent care reveal a nondisplaced triquetrum fracture.  Assessment & Plan: 1.  1 week status post fall with left wrist triquetrium fracture, nondisplaced. -Short arm cast, return in 2 to 3 weeks for cast removal and three-view wrist x-rays.  If stable and healing, we will switch to a  removable splint for the duration of treatment. -Recommended vitamin D3 and vitamin K2 to facilitate bone healing.     Procedures: No procedures performed  No notes on file     PMFS History: Patient Active Problem List   Diagnosis Date Noted  . Hematuria, microscopic 10/04/2019  . Acute coronary syndrome ([place]) 12/23/2018  . Seborrheic dermatitis 08/23/2017  . Peripheral neuropathy 08/22/2017  . Anemia 06/21/2017  . Hematuria, gross 09/10/2016  . Right lumbar radiculitis 11/19/2014  . Healthcare maintenance 12/13/2013  . Dysphagia, unspecified(787.20) 05/10/2013  . Lumbosacral spondylosis without myelopathy 04/18/2013  . Lumbar facet arthropathy 04/18/2013  . Sacroiliac joint dysfunction of left side 02/21/2013  . Leg length discrepancy 02/21/2013  . Seborrhea capitis 01/23/2013  . NSTEMI (non-ST elevated myocardial infarction) ([place]) 08/14/2012  . Tobacco abuse 08/08/2012  . Hypertension 09/25/2011  . GERD 04/08/2010  . Chronic radicular low back pain 07/20/2007  . Diabetes mellitus type 2, controlled ([place]) 11/25/2006  . HYPOGONADISM 11/25/2006  . Hyperlipemia 11/25/2006  . Coronary atherosclerosis 11/25/2006  . Atrial fibrillation ([place]) 11/25/2006   Past Medical History:  Diagnosis Date  . Atrial arrhythmia   . CAP (community acquired pneumonia)   . Cellulitis of knee, right 06/06/12  . Chronic  otitis externa 06/06/12   C Multiple trials with antibiocs and antifungal. Cultures were positive for Pseudomonas. Follow up with ENT on 06/14/12    . CORONARY ARTERY DISEASE 11/25/2006   Cardiac cath by Dr Terrence Dupont 11/2003 LV showed good LV systolic function, EF of 82-42%. Left main was patent. LAD has 20-30% mid stenosis. Diagonal 1 and diagonal 2 were patent. Left circumflex was patent. OM1 was less than 0.5 mm which was diffusely diseased. OM2 has 20% ostial stenosis which was patent which  was moderate size. OM3 was patent at prior PTCA and stented site. RCA has 20-30% proximal     . Hypertension   . NSTEMI (non-ST elevated myocardial infarction) ([place]) 08/14/2012   S/p Successful PTCA to proximal OM-3     . TOBACCO ABUSE 11/25/2006  . Type II diabetes mellitus (H[facility])     Family History  Problem Relation Age of Onset  . Cancer Mother     Past Surgical History:  Procedure Laterality Date  . CORONARY ANGIOPLASTY WITH STENT PLACEMENT  ~ 2005   "1"  . CORONARY STENT INTERVENTION N/A 12/25/2018   Procedure: CORONARY STENT INTERVENTION;  Surgeon: Lorretta Harp, MD;  Location: [facility];  Service: Cardiovascular;  Laterality: N/A;  . CYSTECTOMY  1990's   LUA  . LEFT HEART CATH AND CORONARY ANGIOGRAPHY N/A 12/25/2018   Procedure: LEFT HEART CATH AND CORONARY ANGIOGRAPHY;  Surgeon: Charolette Forward, MD;  Location: [facility];  Service: Cardiovascular;  Laterality: N/A;  . LEFT HEART CATHETERIZATION WITH CORONARY ANGIOGRAM N/A 08/15/2012   Procedure: LEFT HEART CATHETERIZATION WITH CORONARY ANGIOGRAM;  Surgeon: Clent Demark, MD;  Location: [facility];  Service: Cardiovascular;  Laterality: N/A;  . PERCUTANEOUS CORONARY STENT INTERVENTION (PCI-S)  08/15/2012   Procedure: PERCUTANEOUS CORONARY STENT INTERVENTION (PCI-S);  Surgeon: Clent Demark, MD;  Location: [facility];  Service: Cardiovascular;;   Social History   Occupational History  . Not on file  Tobacco Use  . Smoking status: Former Smoker    Packs/day: 0.20    Years: 12.00    Pack years: 2.40    Types: Cigarettes    Quit date: 06/29/2018    Years since quitting: 2.1  . Smokeless tobacco: Never Used  . Tobacco comment: 3 per day   Vaping Use  . Vaping Use: Never used  Substance and Sexual Activity  . Alcohol use: Yes    Alcohol/week: 0.0 standard drinks    Comment: socially  . Drug use: No    Comment: former marijuana  . Sexual activity: Not on file

## 2020-08-08 NOTE — Patient Instructions (Signed)
  For bones:  Vitamin D3:  Take 5,000 IU daily  Vitamin K2:  Take 100 mcg daily

## 2020-08-08 NOTE — Telephone Encounter (Signed)
Patient called stating no one called him back yesterday. Explained that [facility] spoke with him yesterday at 2:54 PM and explained that he has a referral to ortho to evaluate and treat his hand. Lela S is currently working on this referral. Patient would like a call at [phone] as soon as there is an update on ortho referral. L. Krissia Schreier, BSN, RN-BC

## 2020-08-10 NOTE — Progress Notes (Signed)
Internal Medicine Clinic Attending  Case discussed with Dr. Steen  At the time of the visit.  We reviewed the resident's history and exam and pertinent patient test results.  I agree with the assessment, diagnosis, and plan of care documented in the resident's note.  

## 2020-08-26 ENCOUNTER — Ambulatory Visit (INDEPENDENT_AMBULATORY_CARE_PROVIDER_SITE_OTHER): Payer: Medicare Other | Admitting: Family Medicine

## 2020-08-26 ENCOUNTER — Encounter: Payer: Self-pay | Admitting: Family Medicine

## 2020-08-26 ENCOUNTER — Other Ambulatory Visit: Payer: Self-pay

## 2020-08-26 ENCOUNTER — Ambulatory Visit: Payer: Self-pay

## 2020-08-26 DIAGNOSIS — S62115A Nondisplaced fracture of triquetrum [cuneiform] bone, left wrist, initial encounter for closed fracture: Secondary | ICD-10-CM | POA: Diagnosis not present

## 2020-08-26 MED ORDER — TRAMADOL HCL 50 MG PO TABS: 50.0000 mg | ORAL_TABLET | Freq: Every evening | ORAL | 0 refills | Status: DC | PRN

## 2020-08-26 NOTE — Progress Notes (Signed)
Office Visit Note   Patient: [name]           Date of Birth: 02-06-1956           MRN: [id number] Visit Date: 08/26/2020 Requested by: [name], MD [address] [place],  [place] [address] PCP: [name], MD  Subjective: Chief Complaint  Patient presents with  . Left Wrist - Fracture, Follow-up    Approx 3 weeks post injury. [place] removed today after 2 weeks. Still has a little bit of pain, out of cast (no pain in cast).    HPI: He is about 3 weeks status post left wrist triquetrium fracture here for follow-up.  Pain-free in his cast.               ROS:   All other systems were reviewed and are negative.  Objective: Vital Signs: There were no vitals taken for this visit.  Physical Exam:  General:  Alert and oriented, in no acute distress. Pulm:  Breathing unlabored. Psy:  Normal mood, congruent affect. Skin: No skin breakdown Left wrist: He has expected degree of stiffness.  He is still tender to palpation on the dorsal aspect of his triquetrum, but less than before.  Imaging: XR Wrist Complete Left  Result Date: 08/26/2020 X-rays of the left wrist reveal anatomic alignment.  The triquetrum fracture is not really visible.   Assessment & Plan: 1.  Stable 3-week status post left wrist triquetrium fracture -We will switch to a removable wrist splint for the next 3 weeks.  Gentle range of motion and grip strength using pain as guide. -In 3 weeks if pain-free, he will follow-up as needed.  If he is still having some pain we will obtain three-view wrist x-rays.     Procedures: No procedures performed  No notes on file     PMFS History: Patient Active Problem List   Diagnosis Date Noted  . Hematuria, microscopic 10/04/2019  . Acute coronary syndrome ([place]) 12/23/2018  . Seborrheic dermatitis 08/23/2017  . Peripheral neuropathy 08/22/2017  . Anemia 06/21/2017  . Hematuria, gross 09/10/2016  . Right lumbar radiculitis 11/19/2014  .  Healthcare maintenance 12/13/2013  . Dysphagia, unspecified(787.20) 05/10/2013  . Lumbosacral spondylosis without myelopathy 04/18/2013  . Lumbar facet arthropathy 04/18/2013  . Sacroiliac joint dysfunction of left side 02/21/2013  . Leg length discrepancy 02/21/2013  . Seborrhea capitis 01/23/2013  . NSTEMI (non-ST elevated myocardial infarction) ([place]) 08/14/2012  . Tobacco abuse 08/08/2012  . Hypertension 09/25/2011  . GERD 04/08/2010  . Chronic radicular low back pain 07/20/2007  . Diabetes mellitus type 2, controlled ([place]) 11/25/2006  . HYPOGONADISM 11/25/2006  . Hyperlipemia 11/25/2006  . Coronary atherosclerosis 11/25/2006  . Atrial fibrillation ([place]) 11/25/2006   Past Medical History:  Diagnosis Date  . Atrial arrhythmia   . CAP (community acquired pneumonia)   . Cellulitis of knee, right 06/06/12  . Chronic otitis externa 06/06/12   C Multiple trials with antibiocs and antifungal. Cultures were positive for Pseudomonas. Follow up with ENT on 06/14/12    . CORONARY ARTERY DISEASE 11/25/2006   Cardiac cath by Dr Terrence Dupont 11/2003 LV showed good LV systolic function, EF of 65-03%. Left main was patent. LAD has 20-30% mid stenosis. Diagonal 1 and diagonal 2 were patent. Left circumflex was patent. OM1 was less than 0.5 mm which was diffusely diseased. OM2 has 20% ostial stenosis which was patent which  was moderate size. OM3 was patent at prior PTCA and stented  site. RCA has 20-30% proximal   . Hypertension   . NSTEMI (non-ST elevated myocardial infarction) ([place]) 08/14/2012   S/p Successful PTCA to proximal OM-3     . TOBACCO ABUSE 11/25/2006  . Type II diabetes mellitus (H[facility])     Family History  Problem Relation Age of Onset  . Cancer Mother     Past Surgical History:  Procedure Laterality Date  . CORONARY ANGIOPLASTY WITH STENT PLACEMENT  ~ 2005   "1"  . CORONARY STENT INTERVENTION N/A 12/25/2018   Procedure: CORONARY STENT INTERVENTION;  Surgeon: Lorretta Harp, MD;   Location: [facility];  Service: Cardiovascular;  Laterality: N/A;  . CYSTECTOMY  1990's   LUA  . LEFT HEART CATH AND CORONARY ANGIOGRAPHY N/A 12/25/2018   Procedure: LEFT HEART CATH AND CORONARY ANGIOGRAPHY;  Surgeon: Charolette Forward, MD;  Location: [facility];  Service: Cardiovascular;  Laterality: N/A;  . LEFT HEART CATHETERIZATION WITH CORONARY ANGIOGRAM N/A 08/15/2012   Procedure: LEFT HEART CATHETERIZATION WITH CORONARY ANGIOGRAM;  Surgeon: Clent Demark, MD;  Location: [facility];  Service: Cardiovascular;  Laterality: N/A;  . PERCUTANEOUS CORONARY STENT INTERVENTION (PCI-S)  08/15/2012   Procedure: PERCUTANEOUS CORONARY STENT INTERVENTION (PCI-S);  Surgeon: Clent Demark, MD;  Location: [facility];  Service: Cardiovascular;;   Social History   Occupational History  . Not on file  Tobacco Use  . Smoking status: Former Smoker    Packs/day: 0.20    Years: 12.00    Pack years: 2.40    Types: Cigarettes    Quit date: 06/29/2018    Years since quitting: 2.1  . Smokeless tobacco: Never Used  . Tobacco comment: 3 per day   Vaping Use  . Vaping Use: Never used  Substance and Sexual Activity  . Alcohol use: Yes    Alcohol/week: 0.0 standard drinks    Comment: socially  . Drug use: No    Comment: former marijuana  . Sexual activity: Not on file

## 2020-09-11 ENCOUNTER — Other Ambulatory Visit: Payer: Self-pay | Admitting: Family Medicine

## 2020-09-16 ENCOUNTER — Ambulatory Visit: Payer: Medicare Other | Admitting: Family Medicine

## 2020-09-23 ENCOUNTER — Other Ambulatory Visit: Payer: Self-pay

## 2020-09-23 ENCOUNTER — Encounter: Payer: Self-pay | Admitting: Family Medicine

## 2020-09-23 ENCOUNTER — Ambulatory Visit (INDEPENDENT_AMBULATORY_CARE_PROVIDER_SITE_OTHER): Payer: Medicare Other | Admitting: Family Medicine

## 2020-09-23 DIAGNOSIS — S62115D Nondisplaced fracture of triquetrum [cuneiform] bone, left wrist, subsequent encounter for fracture with routine healing: Secondary | ICD-10-CM

## 2020-09-23 NOTE — Progress Notes (Signed)
Office Visit Note   Patient: [name]           Date of Birth: 1956-04-22           MRN: [id number] Visit Date: 09/23/2020 Requested by: [name], MD [address] [place],  [place] [address] PCP: [name], MD  Subjective: Chief Complaint  Patient presents with   Left Wrist - Fracture, Follow-up    Approximately 6 weeks post injury. Only a little pain with certain movements. 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 ROM much better.    HPI: He is about 6 weeks status post left wrist triquetral fracture.  Pain is steadily improving.  Still gets a little soreness when he lifts something heavy or overuses his wrist, but overall he is pleased with his progress.              ROS:   All other systems were reviewed and are negative.  Objective: Vital Signs: There were no vitals taken for this visit.  Physical Exam:  General:  Alert and oriented, in no acute distress. Pulm:  Breathing unlabored. Psy:  Normal mood, congruent affect. Skin: No bruising or erythema Left wrist: Full range of motion compared to the right.  Still slightly tender on the dorsum of his wrist over the triquetrum, but only minimally.  Imaging: None today  Assessment & Plan: 1.  Clinically healing 6 weeks status post left wrist triquetrum fracture -Switch to a neoprene compressive wrap over-the-counter as needed.  Start doing Thera-Band strengthening exercises.  In 2 to 3 weeks if still having pain we will repeat three-view wrist x-rays.  Otherwise I will see him back as needed.     Procedures: No procedures performed  No notes on file     PMFS History: Patient Active Problem List   Diagnosis Date Noted   Hematuria, microscopic 10/04/2019   Acute coronary syndrome ([place]) 12/23/2018   Seborrheic dermatitis 08/23/2017   Peripheral neuropathy 08/22/2017   Anemia 06/21/2017   Hematuria, gross 09/10/2016   Right lumbar radiculitis 11/19/2014   Healthcare  maintenance 12/13/2013   Dysphagia, unspecified(787.20) 05/10/2013   Lumbosacral spondylosis without myelopathy 04/18/2013   Lumbar facet arthropathy 04/18/2013   Sacroiliac joint dysfunction of left side 02/21/2013   Leg length discrepancy 02/21/2013   Seborrhea capitis 01/23/2013   NSTEMI (non-ST elevated myocardial infarction) ([place]) 08/14/2012   Tobacco abuse 08/08/2012   Hypertension 09/25/2011   GERD 04/08/2010   Chronic radicular low back pain 07/20/2007   Diabetes mellitus type 2, controlled ([place]) 11/25/2006   HYPOGONADISM 11/25/2006   Hyperlipemia 11/25/2006   Coronary atherosclerosis 11/25/2006   Atrial fibrillation ([place]) 11/25/2006   Past Medical History:  Diagnosis Date   Atrial arrhythmia    CAP (community acquired pneumonia)    Cellulitis of knee, right 06/06/12   Chronic otitis externa 06/06/12   C Multiple trials with antibiocs and antifungal. Cultures were positive for Pseudomonas. Follow up with ENT on 06/14/12     CORONARY ARTERY DISEASE 11/25/2006   Cardiac cath by Dr Terrence Dupont 11/2003 LV showed good LV systolic function, EF of 17-00%. Left main was patent. LAD has 20-30% mid stenosis. Diagonal 1 and diagonal 2 were patent. Left circumflex was patent. OM1 was less than 0.5 mm which was diffusely diseased. OM2 has 20% ostial stenosis which was patent which  was moderate size. OM3 was patent at prior PTCA and stented site. RCA has 20-30% proximal    Hypertension    NSTEMI (  non-ST elevated myocardial infarction) ([place]) 08/14/2012   S/p Successful PTCA to proximal OM-3      TOBACCO ABUSE 11/25/2006   Type II diabetes mellitus ([place])     Family History  Problem Relation Age of Onset   Cancer Mother     Past Surgical History:  Procedure Laterality Date   CORONARY ANGIOPLASTY WITH STENT PLACEMENT  ~ 2005   "1"   CORONARY STENT INTERVENTION N/A 12/25/2018   Procedure: CORONARY STENT INTERVENTION;  Surgeon: Lorretta Harp, MD;  Location: [facility];  Service: Cardiovascular;  Laterality: N/A;   CYSTECTOMY  1990's   LUA   LEFT HEART CATH AND CORONARY ANGIOGRAPHY N/A 12/25/2018   Procedure: LEFT HEART CATH AND CORONARY ANGIOGRAPHY;  Surgeon: Charolette Forward, MD;  Location: [facility];  Service: Cardiovascular;  Laterality: N/A;   LEFT HEART CATHETERIZATION WITH CORONARY ANGIOGRAM N/A 08/15/2012   Procedure: LEFT HEART CATHETERIZATION WITH CORONARY ANGIOGRAM;  Surgeon: Clent Demark, MD;  Location: [facility];  Service: Cardiovascular;  Laterality: N/A;   PERCUTANEOUS CORONARY STENT INTERVENTION (PCI-S)  08/15/2012   Procedure: PERCUTANEOUS CORONARY STENT INTERVENTION (PCI-S);  Surgeon: Clent Demark, MD;  Location: [facility];  Service: Cardiovascular;;   Social History   Occupational History   Not on file  Tobacco Use   Smoking status: Former Smoker    Packs/day: 0.20    Years: 12.00    Pack years: 2.40    Types: Cigarettes    Quit date: 06/29/2018    Years since quitting: 2.2   Smokeless tobacco: Never Used   Tobacco comment: 3 per day   Vaping Use   Vaping Use: Never used  Substance and Sexual Activity   Alcohol use: Yes    Alcohol/week: 0.0 standard drinks    Comment: socially   Drug use: No    Comment: former marijuana   Sexual activity: Not on file

## 2020-10-01 ENCOUNTER — Other Ambulatory Visit: Payer: Self-pay | Admitting: Internal Medicine

## 2020-10-01 ENCOUNTER — Other Ambulatory Visit: Payer: Self-pay | Admitting: Family Medicine

## 2020-10-01 DIAGNOSIS — G8929 Other chronic pain: Secondary | ICD-10-CM

## 2020-10-10 DIAGNOSIS — I480 Paroxysmal atrial fibrillation: Secondary | ICD-10-CM | POA: Diagnosis not present

## 2020-10-10 DIAGNOSIS — I2510 Atherosclerotic heart disease of native coronary artery without angina pectoris: Secondary | ICD-10-CM | POA: Diagnosis not present

## 2020-10-10 DIAGNOSIS — E119 Type 2 diabetes mellitus without complications: Secondary | ICD-10-CM | POA: Diagnosis not present

## 2020-10-10 DIAGNOSIS — E785 Hyperlipidemia, unspecified: Secondary | ICD-10-CM | POA: Diagnosis not present

## 2020-10-10 DIAGNOSIS — I10 Essential (primary) hypertension: Secondary | ICD-10-CM | POA: Diagnosis not present

## 2020-10-29 ENCOUNTER — Other Ambulatory Visit: Payer: Self-pay

## 2020-10-29 MED ORDER — EMPAGLIFLOZIN 10 MG PO TABS
10.0000 mg | ORAL_TABLET | Freq: Every day | ORAL | 0 refills | Status: DC
Start: 2020-10-29 — End: 2021-01-13

## 2020-12-07 ENCOUNTER — Other Ambulatory Visit: Payer: Self-pay | Admitting: Internal Medicine

## 2021-01-12 ENCOUNTER — Other Ambulatory Visit: Payer: Self-pay | Admitting: Student

## 2021-01-12 ENCOUNTER — Other Ambulatory Visit: Payer: Self-pay | Admitting: Internal Medicine

## 2021-01-25 ENCOUNTER — Other Ambulatory Visit: Payer: Self-pay | Admitting: Internal Medicine

## 2021-02-12 ENCOUNTER — Encounter: Payer: Medicare Other | Admitting: Internal Medicine

## 2021-02-13 ENCOUNTER — Encounter: Payer: Medicare Other | Admitting: Internal Medicine

## 2021-02-18 ENCOUNTER — Encounter: Payer: Self-pay | Admitting: Internal Medicine

## 2021-02-18 ENCOUNTER — Ambulatory Visit (INDEPENDENT_AMBULATORY_CARE_PROVIDER_SITE_OTHER): Payer: Medicare HMO | Admitting: Internal Medicine

## 2021-02-18 ENCOUNTER — Other Ambulatory Visit: Payer: Self-pay

## 2021-02-18 VITALS — BP 135/78 | HR 70 | Temp 98.0°F | Ht 75.0 in | Wt 237.2 lb

## 2021-02-18 DIAGNOSIS — E785 Hyperlipidemia, unspecified: Secondary | ICD-10-CM

## 2021-02-18 DIAGNOSIS — I10 Essential (primary) hypertension: Secondary | ICD-10-CM

## 2021-02-18 DIAGNOSIS — M5459 Other low back pain: Secondary | ICD-10-CM | POA: Diagnosis not present

## 2021-02-18 DIAGNOSIS — E1121 Type 2 diabetes mellitus with diabetic nephropathy: Secondary | ICD-10-CM | POA: Diagnosis not present

## 2021-02-18 DIAGNOSIS — M549 Dorsalgia, unspecified: Secondary | ICD-10-CM

## 2021-02-18 LAB — POCT GLYCOSYLATED HEMOGLOBIN (HGB A1C): Hemoglobin A1C: 7 % — AB (ref 4.0–5.6)

## 2021-02-18 LAB — GLUCOSE, CAPILLARY: Glucose-Capillary: 177 mg/dL — ABNORMAL HIGH (ref 70–99)

## 2021-02-18 MED ORDER — LISINOPRIL 10 MG PO TABS: 10.0000 mg | ORAL_TABLET | Freq: Every day | ORAL | 3 refills | Status: DC

## 2021-02-18 MED ORDER — ACETAMINOPHEN 500 MG PO TABS: 500.0000 mg | ORAL_TABLET | Freq: Four times a day (QID) | ORAL | 0 refills | Status: AC | PRN

## 2021-02-18 NOTE — Assessment & Plan Note (Signed)
Patient on Crestor 40 mg daily has not had a Lipid Panel since 2020. Will repeat today.  - Lipid panel - Continue Crestor 40 mg daily

## 2021-02-18 NOTE — Assessment & Plan Note (Addendum)
Patient's BP today is:  Vitals with BMI 02/18/2021 02/18/2021 08/06/2020  Height - 6\' 3"  -  Weight - 237 lbs 3 oz -  BMI - 46.00 -  Systolic 298 473 085  Diastolic 78 76 73  Pulse 70 67 71   He is currently doing well on his Zestril 5 mg daily. He is not having any side effects of his medication - BMP today  - Increase Zestril to 10 mg daily - FU vitals at next visit

## 2021-02-18 NOTE — Patient Instructions (Addendum)
To [name],  It was a pleasure seeing you again! Today we talked about your cholesterol, blood pressure, diabetes, and back pain. For your cholesterol, I would like for you to continue rosuvastatin 40 mg every night. For your blood pressure, I would like to increase your Zestril (lisinopril) to 10 mg (2 tablets) daily. For your back pain, I will order you tylenol. I would also like for you to use a heating pad and work on some back exercises that I will have attached to this paperwork. We will have you come back in a month to check your back pain and blood pressure.  Have a good day,  [name], MD Back Exercises These exercises help to make your trunk and back strong. They also help to keep the lower back flexible. Doing these exercises can help to prevent back pain or lessen existing pain.  If you have back pain, try to do these exercises 2-3 times each day or as told by your doctor.  As you get better, do the exercises once each day. Repeat the exercises more often as told by your doctor.  To stop back pain from coming back, do the exercises once each day, or as told by your doctor. Exercises Single knee to chest Do these steps 3-5 times in a row for each leg: 1. Lie on your back on a firm bed or the floor with your legs stretched out. 2. Bring one knee to your chest. 3. Grab your knee or thigh with both hands and hold them it in place. 4. Pull on your knee until you feel a gentle stretch in your lower back or buttocks. 5. Keep doing the stretch for 10-30 seconds. 6. Slowly let go of your leg and straighten it. Pelvic tilt Do these steps 5-10 times in a row: 1. Lie on your back on a firm bed or the floor with your legs stretched out. 2. Bend your knees so they point up to the ceiling. Your feet should be flat on the floor. 3. Tighten your lower belly (abdomen) muscles to press your lower back against the floor. This will make your tailbone point up to the ceiling instead of pointing  down to your feet or the floor. 4. Stay in this position for 5-10 seconds while you gently tighten your muscles and breathe evenly. Cat-cow Do these steps until your lower back bends more easily: 1. Get on your hands and knees on a firm surface. Keep your hands under your shoulders, and keep your knees under your hips. You may put padding under your knees. 2. Let your head hang down toward your chest. Tighten (contract) the muscles in your belly. Point your tailbone toward the floor so your lower back becomes rounded like the back of a cat. 3. Stay in this position for 5 seconds. 4. Slowly lift your head. Let the muscles of your belly relax. Point your tailbone up toward the ceiling so your back forms a sagging arch like the back of a cow. 5. Stay in this position for 5 seconds.   Press-ups Do these steps 5-10 times in a row: 1. Lie on your belly (face-down) on the floor. 2. Place your hands near your head, about shoulder-width apart. 3. While you keep your back relaxed and keep your hips on the floor, slowly straighten your arms to raise the top half of your body and lift your shoulders. Do not use your back muscles. You may change where you place your hands in  order to make yourself more comfortable. 4. Stay in this position for 5 seconds. 5. Slowly return to lying flat on the floor.   Bridges Do these steps 10 times in a row: 1. Lie on your back on a firm surface. 2. Bend your knees so they point up to the ceiling. Your feet should be flat on the floor. Your arms should be flat at your sides, next to your body. 3. Tighten your butt muscles and lift your butt off the floor until your waist is almost as high as your knees. If you do not feel the muscles working in your butt and the back of your thighs, slide your feet 1-2 inches farther away from your butt. 4. Stay in this position for 3-5 seconds. 5. Slowly lower your butt to the floor, and let your butt muscles relax. If this exercise is  too easy, try doing it with your arms crossed over your chest.   Belly crunches Do these steps 5-10 times in a row: 1. Lie on your back on a firm bed or the floor with your legs stretched out. 2. Bend your knees so they point up to the ceiling. Your feet should be flat on the floor. 3. Cross your arms over your chest. 4. Tip your chin a little bit toward your chest but do not bend your neck. 5. Tighten your belly muscles and slowly raise your chest just enough to lift your shoulder blades a tiny bit off of the floor. Avoid raising your body higher than that, because it can put too much stress on your low back. 6. Slowly lower your chest and your head to the floor. Back lifts Do these steps 5-10 times in a row: 1. Lie on your belly (face-down) with your arms at your sides, and rest your forehead on the floor. 2. Tighten the muscles in your legs and your butt. 3. Slowly lift your chest off of the floor while you keep your hips on the floor. Keep the back of your head in line with the curve in your back. Look at the floor while you do this. 4. Stay in this position for 3-5 seconds. 5. Slowly lower your chest and your face to the floor. Contact a doctor if:  Your back pain gets a lot worse when you do an exercise.  Your back pain does not get better 2 hours after you exercise. If you have any of these problems, stop doing the exercises. Do not do them again unless your doctor says it is okay. Get help right away if:  You have sudden, very bad back pain. If this happens, stop doing the exercises. Do not do them again unless your doctor says it is okay. This information is not intended to replace advice given to you by your health care provider. Make sure you discuss any questions you have with your health care provider. Document Revised: 08/10/2018 Document Reviewed: 08/10/2018 Elsevier Patient Education  2021 [employer].

## 2021-02-18 NOTE — Assessment & Plan Note (Addendum)
Patient endorses back pain today after extensively tilling his garden yesterday. He states that his pain is on both sides of his lower back. His pain is characterized as squeezing with no radiation. He denies bilateral leg weakness, incontinence, numbness, or red flag symptoms for cord compression.   On physical examination he has lumbar paraspinal MSK pain. No step off of the spinal column.   A/P:  Patient presents with msk pain after working in his garden. There is tenderness to palpation of the paraspinal musculature of the lumbar spine bilaterally. Will treat conservatively at this time.  - Heating pad, back exercises, and tylenol 500 mg Q6H PRN for back pain.  - F/U in 4 weeks

## 2021-02-18 NOTE — Assessment & Plan Note (Addendum)
Patient on Metformin 1000 mg BID and Jardiance 10 mg Daily, with a previous A1c of 7.4 presents to the clinic today. Repeat A1c of 7.0% today. He states that due to his wife's severe sickle cell disease, he has shifted to eating a healthier diet. He does mention that he has not been checking his blood sugar levels, and cannot recall the last count he took.  - A1c today - Complete foot exam - Continue current regimen  - F/U in 3 months

## 2021-02-18 NOTE — Progress Notes (Signed)
   CC: Follow Up/Back pain   HPI:  Mr.Cartel S Litke is a 65 y.o. M/F, with a PMH noted below, who presents to the clinic Follow up and back pain. To see the management of their acute and chronic conditions, please see the A&P note under the Encounters tab.   Past Medical History:  Diagnosis Date  . Atrial arrhythmia   . CAP (community acquired pneumonia)   . Cellulitis of knee, right 06/06/12  . Chronic otitis externa 06/06/12   C Multiple trials with antibiocs and antifungal. Cultures were positive for Pseudomonas. Follow up with ENT on 06/14/12    . CORONARY ARTERY DISEASE 11/25/2006   Cardiac cath by Dr Terrence Dupont 11/2003 LV showed good LV systolic function, EF of 65-68%. Left main was patent. LAD has 20-30% mid stenosis. Diagonal 1 and diagonal 2 were patent. Left circumflex was patent. OM1 was less than 0.5 mm which was diffusely diseased. OM2 has 20% ostial stenosis which was patent which  was moderate size. OM3 was patent at prior PTCA and stented site. RCA has 20-30% proximal   . Hypertension   . NSTEMI (non-ST elevated myocardial infarction) ([place]) 08/14/2012   S/p Successful PTCA to proximal OM-3     . TOBACCO ABUSE 11/25/2006  . Type II diabetes mellitus ([place])    Review of Systems:   Review of Systems  Constitutional: Negative for chills, fever, malaise/fatigue and weight loss.  Cardiovascular: Negative for chest pain and palpitations.  Gastrointestinal: Negative for abdominal pain, constipation, diarrhea, nausea and vomiting.  Musculoskeletal: Positive for back pain. Negative for joint pain, myalgias and neck pain.     Physical Exam:  Vitals:   02/18/21 1339 02/18/21 1341  BP: (!) 141/76 135/78  Pulse: 67 70  Temp: 98 F (36.7 C)   TempSrc: Oral   SpO2: 99%   Weight: 237 lb 3.2 oz (107.6 kg)   Height: 6\' 3"  (1.905 m)    Physical Exam Constitutional:      General: He is not in acute distress.    Appearance: Normal appearance. He is not ill-appearing, toxic-appearing or  diaphoretic.  Cardiovascular:     Rate and Rhythm: Normal rate and regular rhythm.     Pulses: Normal pulses.     Heart sounds: Normal heart sounds. No murmur heard. No friction rub. No gallop.      Comments: Pedal pulses 3+ bilaterally  Pulmonary:     Effort: Pulmonary effort is normal.     Breath sounds: Normal breath sounds. No wheezing, rhonchi or rales.  Abdominal:     General: Abdomen is flat. Bowel sounds are normal.     Palpations: Abdomen is soft.     Tenderness: There is no abdominal tenderness. There is no guarding or rebound.     Hernia: No hernia is present.  Musculoskeletal:        General: No swelling, tenderness, deformity or signs of injury.     Right lower leg: No edema.     Left lower leg: No edema.  Neurological:     Mental Status: He is alert.      Assessment & Plan:   See Encounters Tab for problem based charting.  Patient discussed with Dr. Philipp Ovens

## 2021-02-19 LAB — LIPID PANEL
Chol/HDL Ratio: 2.5 ratio (ref 0.0–5.0)
Cholesterol, Total: 100 mg/dL (ref 100–199)
HDL: 40 mg/dL (ref >39)
LDL Chol Calc (NIH): 44 mg/dL (ref 0–99)
Triglycerides: 81 mg/dL (ref 0–149)
VLDL Cholesterol Cal: 16 mg/dL (ref 5–40)

## 2021-02-19 LAB — BMP8+ANION GAP
Anion Gap: 16 mmol/L (ref 10.0–18.0)
BUN/Creatinine Ratio: 13 (ref 10–24)
BUN: 18 mg/dL (ref 8–27)
CO2: 24 mmol/L (ref 20–29)
Calcium: 9.5 mg/dL (ref 8.6–10.2)
Chloride: 99 mmol/L (ref 96–106)
Creatinine, Ser: 1.37 mg/dL — ABNORMAL HIGH (ref 0.76–1.27)
Glucose: 164 mg/dL — ABNORMAL HIGH (ref 65–99)
Potassium: 5.1 mmol/L (ref 3.5–5.2)
Sodium: 139 mmol/L (ref 134–144)
eGFR: 58 mL/min/{1.73_m2} — ABNORMAL LOW (ref >59)

## 2021-02-19 NOTE — Progress Notes (Signed)
Internal Medicine Clinic Attending  Case discussed with Dr. Gilford Rile  At the time of the visit.  We reviewed the resident's history and exam and pertinent patient test results.  I agree with the assessment, diagnosis, and plan of care documented in the resident's note.    Creatinine is elevated after initiate of lisinopril. Repeat BMP at follow up.

## 2021-02-20 ENCOUNTER — Emergency Department (HOSPITAL_COMMUNITY): Payer: Medicare HMO

## 2021-02-20 ENCOUNTER — Telehealth: Payer: Self-pay | Admitting: Internal Medicine

## 2021-02-20 ENCOUNTER — Encounter (HOSPITAL_COMMUNITY): Payer: Self-pay | Admitting: Emergency Medicine

## 2021-02-20 ENCOUNTER — Emergency Department (HOSPITAL_COMMUNITY)
Admission: EM | Admit: 2021-02-20 | Discharge: 2021-02-20 | Disposition: A | Discharge status: Home / Self Care | Payer: Medicare HMO | Attending: Emergency Medicine | Admitting: Emergency Medicine

## 2021-02-20 DIAGNOSIS — Z7984 Long term (current) use of oral hypoglycemic drugs: Secondary | ICD-10-CM | POA: Insufficient documentation

## 2021-02-20 DIAGNOSIS — I10 Essential (primary) hypertension: Secondary | ICD-10-CM | POA: Insufficient documentation

## 2021-02-20 DIAGNOSIS — W01198A Fall on same level from slipping, tripping and stumbling with subsequent striking against other object, initial encounter: Secondary | ICD-10-CM | POA: Insufficient documentation

## 2021-02-20 DIAGNOSIS — Z87891 Personal history of nicotine dependence: Secondary | ICD-10-CM | POA: Diagnosis not present

## 2021-02-20 DIAGNOSIS — Z7902 Long term (current) use of antithrombotics/antiplatelets: Secondary | ICD-10-CM | POA: Diagnosis not present

## 2021-02-20 DIAGNOSIS — I2510 Atherosclerotic heart disease of native coronary artery without angina pectoris: Secondary | ICD-10-CM | POA: Insufficient documentation

## 2021-02-20 DIAGNOSIS — S0990XA Unspecified injury of head, initial encounter: Secondary | ICD-10-CM | POA: Diagnosis present

## 2021-02-20 DIAGNOSIS — Y990 Civilian activity done for income or pay: Secondary | ICD-10-CM | POA: Diagnosis not present

## 2021-02-20 DIAGNOSIS — Z7982 Long term (current) use of aspirin: Secondary | ICD-10-CM | POA: Diagnosis not present

## 2021-02-20 DIAGNOSIS — Z955 Presence of coronary angioplasty implant and graft: Secondary | ICD-10-CM | POA: Diagnosis not present

## 2021-02-20 DIAGNOSIS — E1140 Type 2 diabetes mellitus with diabetic neuropathy, unspecified: Secondary | ICD-10-CM | POA: Insufficient documentation

## 2021-02-20 DIAGNOSIS — S0083XA Contusion of other part of head, initial encounter: Secondary | ICD-10-CM | POA: Diagnosis not present

## 2021-02-20 DIAGNOSIS — W19XXXA Unspecified fall, initial encounter: Secondary | ICD-10-CM

## 2021-02-20 DIAGNOSIS — Z79899 Other long term (current) drug therapy: Secondary | ICD-10-CM | POA: Diagnosis not present

## 2021-02-20 DIAGNOSIS — M79651 Pain in right thigh: Secondary | ICD-10-CM | POA: Insufficient documentation

## 2021-02-20 MED ORDER — HYDROCODONE-ACETAMINOPHEN 5-325 MG PO TABS
1.0000 | ORAL_TABLET | Freq: Once | ORAL | Status: AC
  Administered 2021-02-20: 1 via ORAL
  Filled 2021-02-20: qty 1

## 2021-02-20 MED ORDER — ONDANSETRON HCL 4 MG PO TABS
4.0000 mg | ORAL_TABLET | Freq: Once | ORAL | Status: AC
  Administered 2021-02-20: 4 mg via ORAL
  Filled 2021-02-20: qty 1

## 2021-02-20 NOTE — Telephone Encounter (Signed)
Wife returned call. States patient called her and told he he hit his head and his face is swollen. She knows no other details. Explained importance of having patient seen in ED as soon as possible as he will need imaging. States she will call the friend he is with to take him to ED now. She is very [employer].

## 2021-02-20 NOTE — Discharge Instructions (Addendum)
-  CT scans and xrays did not show any broken bones.  -You should take Tylenol at home for pain as we discussed.  -Follow-up with primary care doctor early next week to get a recheck of your facial swelling.   Return to the emergency room if any new or worsening symptoms.

## 2021-02-20 NOTE — ED Notes (Signed)
Marzella Schlein called to tell the PT to call her when he is discharged for a ride call [phone]

## 2021-02-20 NOTE — ED Notes (Signed)
PT to CT.

## 2021-02-20 NOTE — Telephone Encounter (Signed)
Call placed to patient's wife x 2. Recording immediately comes on. Left message on VM requesting return call.

## 2021-02-20 NOTE — ED Notes (Signed)
Patient transported to CT 

## 2021-02-20 NOTE — ED Provider Notes (Signed)
[facility] EMERGENCY DEPARTMENT Provider Note   CSN: [id number] Arrival date & time: 02/20/21  1356     History Chief Complaint  Patient presents with  . Fall    [name] is a 65 y.o. male with past medical history significant for CAD, hypertension, type 2 diabetes.  Takes daily aspirin.  HPI Patient presents to emergency room today with chief complaint of mechanical fall happening this afternoon, approximately 1 hour prior to arrival.  He states he tripped over a cord at work and fell on the floor pavement hitting the right side of his face.  He denies any loss of consciousness. He denies any prodrome of chest pain, shortness of breath, dizziness, lightheadedness, back pain.  He is endorsing pain to right face and right thigh.  No medications for symptoms prior to arrival. He rates pain 8/10 in severity. He denies fever, chills, headache, visual changes, dental pain, neck pain, numbness, weakness , tingling.     Past Medical History:  Diagnosis Date  . Atrial arrhythmia   . CAP (community acquired pneumonia)   . Cellulitis of knee, right 06/06/12  . Chronic otitis externa 06/06/12   C Multiple trials with antibiocs and antifungal. Cultures were positive for Pseudomonas. Follow up with ENT on 06/14/12    . CORONARY ARTERY DISEASE 11/25/2006   Cardiac cath by Dr Terrence Dupont 11/2003 LV showed good LV systolic function, EF of 78-58%. Left main was patent. LAD has 20-30% mid stenosis. Diagonal 1 and diagonal 2 were patent. Left circumflex was patent. OM1 was less than 0.5 mm which was diffusely diseased. OM2 has 20% ostial stenosis which was patent which  was moderate size. OM3 was patent at prior PTCA and stented site. RCA has 20-30% proximal   . Hypertension   . NSTEMI (non-ST elevated myocardial infarction) ([place]) 08/14/2012   S/p Successful PTCA to proximal OM-3     . TOBACCO ABUSE 11/25/2006  . Type II diabetes mellitus [facility])     Patient Active Problem List    Diagnosis Date Noted  . Acute back pain less than 4 weeks duration 02/18/2021  . Hematuria, microscopic 10/04/2019  . Acute coronary syndrome ([place]) 12/23/2018  . Seborrheic dermatitis 08/23/2017  . Peripheral neuropathy 08/22/2017  . Anemia 06/21/2017  . Hematuria, gross 09/10/2016  . Right lumbar radiculitis 11/19/2014  . Healthcare maintenance 12/13/2013  . Dysphagia, unspecified(787.20) 05/10/2013  . Lumbosacral spondylosis without myelopathy 04/18/2013  . Lumbar facet arthropathy 04/18/2013  . Sacroiliac joint dysfunction of left side 02/21/2013  . Leg length discrepancy 02/21/2013  . Seborrhea capitis 01/23/2013  . NSTEMI (non-ST elevated myocardial infarction) ([place]) 08/14/2012  . Tobacco abuse 08/08/2012  . Hypertension 09/25/2011  . GERD 04/08/2010  . Chronic radicular low back pain 07/20/2007  . Diabetes mellitus type 2, controlled ([place]) 11/25/2006  . HYPOGONADISM 11/25/2006  . Hyperlipemia 11/25/2006  . Coronary atherosclerosis 11/25/2006  . Atrial fibrillation ([place]) 11/25/2006    Past Surgical History:  Procedure Laterality Date  . CORONARY ANGIOPLASTY WITH STENT PLACEMENT  ~ 2005   "1"  . CORONARY STENT INTERVENTION N/A 12/25/2018   Procedure: CORONARY STENT INTERVENTION;  Surgeon: Lorretta Harp, MD;  Location: [facility] CV LAB;  Service: Cardiovascular;  Laterality: N/A;  . CYSTECTOMY  1990's   LUA  . LEFT HEART CATH AND CORONARY ANGIOGRAPHY N/A 12/25/2018   Procedure: LEFT HEART CATH AND CORONARY ANGIOGRAPHY;  Surgeon: Charolette Forward, MD;  Location: [facility] CV [facility];  Service: Cardiovascular;  Laterality: N/A;  .  LEFT HEART CATHETERIZATION WITH CORONARY ANGIOGRAM N/A 08/15/2012   Procedure: LEFT HEART CATHETERIZATION WITH CORONARY ANGIOGRAM;  Surgeon: Clent Demark, MD;  Location: [facility] CATH LAB;  Service: Cardiovascular;  Laterality: N/A;  . PERCUTANEOUS CORONARY STENT INTERVENTION (PCI-S)  08/15/2012   Procedure: PERCUTANEOUS CORONARY STENT INTERVENTION  (PCI-S);  Surgeon: Clent Demark, MD;  Location: [facility] CATH LAB;  Service: Cardiovascular;;       Family History  Problem Relation Age of Onset  . Cancer Mother     Social History   Tobacco Use  . Smoking status: Former Smoker    Packs/day: 0.20    Years: 12.00    Pack years: 2.40    Types: Cigarettes    Quit date: 06/29/2018    Years since quitting: 2.6  . Smokeless tobacco: Never Used  . Tobacco comment: 3 per day   Vaping Use  . Vaping Use: Never used  Substance Use Topics  . Alcohol use: Yes    Alcohol/week: 0.0 standard drinks    Comment: socially  . Drug use: No    Comment: former marijuana    Home Medications Prior to Admission medications   Medication Sig Start Date End Date Taking? Authorizing Provider  acetaminophen (TYLENOL) 500 MG tablet Take 1 tablet (500 mg total) by mouth every 6 (six) hours as needed. 02/18/21   [name], MD  aspirin EC 81 MG EC tablet Take 1 tablet (81 mg total) by mouth daily. 12/27/18   Charolette Forward, MD  clopidogrel (PLAVIX) 75 MG tablet Take 1 tablet (75 mg total) by mouth daily. 12/27/18   Charolette Forward, MD  gabapentin (NEURONTIN) 600 MG tablet TAKE 1 TABLET BY MOUTH THREE TIMES DAILY 10/02/20   Virl Axe, MD  isosorbide mononitrate (IMDUR) 30 MG 24 hr tablet Take 1 tablet by mouth once daily 09/11/19   [name], MD  JARDIANCE 10 MG TABS tablet Take 1 tablet by mouth once daily 01/13/21   Cato Mulligan, MD  lisinopril (ZESTRIL) 10 MG tablet Take 1 tablet (10 mg total) by mouth daily. 02/18/21   [name], MD  metFORMIN (GLUCOPHAGE) 1000 MG tablet Take 1 tablet (1,000 mg total) by mouth 2 (two) times daily with a meal. 12/28/18   Charolette Forward, MD  nitroGLYCERIN (NITROSTAT) 0.4 MG SL tablet DISSOLVE ONE TABLET UNDER THE TONGUE EVERY 5 MINUTES AS NEEDED FOR CHEST PAIN.  DO NOT EXCEED A TOTAL OF 3 DOSES IN 15 MINUTES 02/12/19   Hoffman, Jessica Ratliff, DO  pantoprazole (PROTONIX) 40 MG tablet Take 1 tablet (40 mg  total) by mouth daily. 08/06/20   Madalyn Rob, MD  rosuvastatin (CRESTOR) 40 MG tablet Take 1 tablet (40 mg total) by mouth at bedtime. 08/06/20   Madalyn Rob, MD  sotalol (BETAPACE) 80 MG tablet Take 1/2 (one-half) tablet by mouth twice daily 01/26/21   Cato Mulligan, MD  traMADol (ULTRAM) 50 MG tablet TAKE 1 TABLET BY MOUTH EVERY 6 HOURS AS NEEDED 10/01/20   Hilts, Legrand Como, MD    Allergies    Clindamycin/lincomycin  Review of Systems   Review of Systems All other systems are reviewed and are negative for acute change except as noted in the HPI.  Physical Exam Updated Vital Signs BP (!) 130/97 (BP Location: Right Arm)   Pulse 72   Temp 98.6 F (37 C)   Resp 16   SpO2 97%   Physical Exam Vitals and nursing note reviewed.  Constitutional:      Appearance: He is well-developed. He  is not ill-appearing or toxic-appearing.  HENT:     Head: Normocephalic. No Battle's sign.     Jaw: There is normal jaw occlusion.      Comments: Contusion to right cheek as depicted in image above. Tender to palpation with purple coloring. No open wound or active bleeding.     Right Ear: No hemotympanum.     Left Ear: No hemotympanum.     Nose: Nose normal.     Right Nostril: No epistaxis or septal hematoma.     Left Nostril: No epistaxis or septal hematoma.     Mouth/Throat:     Mouth: Mucous membranes are dry.     Dentition: No dental tenderness.     Pharynx: Oropharynx is clear. Uvula midline.  Eyes:     General: Lids are everted, no foreign bodies appreciated. Vision grossly intact. Gaze aligned appropriately. No scleral icterus.       Right eye: No discharge.        Left eye: No discharge.     Extraocular Movements: Extraocular movements intact.     Conjunctiva/sclera: Conjunctivae normal.     Comments: No pain with EOMs  Neck:     Vascular: No JVD.     Comments: Full ROM intact without spinous process TTP. No bony stepoffs or deformities, no paraspinous muscle TTP or muscle spasms. No  rigidity or meningeal signs. No bruising, erythema, or swelling.  Cardiovascular:     Rate and Rhythm: Normal rate and regular rhythm.     Pulses: Normal pulses.          Dorsalis pedis pulses are 2+ on the right side and 2+ on the left side.     Heart sounds: Normal heart sounds.  Pulmonary:     Effort: Pulmonary effort is normal.     Breath sounds: Normal breath sounds.  Chest:     Comments: No chest contusion. No anterior chest wall tenderness.  No deformity or crepitus noted.  No evidence of flail chest.  Abdominal:     General: There is no distension.  Musculoskeletal:        General: Normal range of motion.     Cervical back: Normal range of motion.     Comments: Moving all extremities easily. No deformities noted.  Tender to palpation of right hip and femur without deformity. No swelling. No crepitus. Compartments in right lower extremity are soft. No tenderness to right knee or ankle. Wiggles toes without difficulty.  Skin:    General: Skin is warm and dry.     Capillary Refill: Capillary refill takes less than 2 seconds.  Neurological:     Mental Status: He is oriented to person, place, and time.     GCS: GCS eye subscore is 4. GCS verbal subscore is 5. GCS motor subscore is 6.     Comments: Fluent speech, no facial droop.  Psychiatric:        Behavior: Behavior normal.     ED Results / Procedures / Treatments   Labs (all labs ordered are listed, but only abnormal results are displayed) Labs Reviewed - No data to display  EKG None  Radiology CT Head Wo Contrast  Result Date: 02/20/2021 CLINICAL DATA:  [place] after mowing the yard, striking face on concrete, RIGHT cheek hematoma, RIGHT facial pain. History coronary artery disease, hypertension, type II diabetes mellitus, former smoker EXAM: CT HEAD WITHOUT CONTRAST CT MAXILLOFACIAL WITHOUT CONTRAST TECHNIQUE: Multidetector CT imaging of the head and maxillofacial structures were performed  using the standard protocol  without intravenous contrast. Multiplanar CT image reconstructions of the maxillofacial structures were also generated. Right side of face marked with BB. COMPARISON:  None FINDINGS: CT HEAD FINDINGS Brain: Normal ventricular morphology. No midline shift or mass effect. Normal appearance of brain parenchyma. No intracranial hemorrhage, mass lesion, evidence of acute infarction, or extra-axial fluid collection. Vascular: No hyperdense vessels Skull: Intact Other: N/A CT MAXILLOFACIAL FINDINGS Osseous: Osseous mineralization normal. Mandible intact with normal TMJ alignment. Zygomas intact. Orbits and paranasal sinuses intact. No facial bone fractures identified. Visualized cervical spine unremarkable. Orbits: Bony orbits intact.  Intraorbital soft tissue planes clear. Sinuses: No sinus opacification, air-fluid levels, or mucosal thickening. Small osteomas of the frontal sinus. Tiny mucosal retention cysts LEFT maxillary sinus. Soft tissues: RIGHT premaxillary soft tissue swelling, contusion and small hematoma. Scattered soft tissue calcifications in the face and scalp bilaterally faded RIGHT periorbital hematoma/contusion. Atherosclerotic calcifications of internal carotid arteries at skull base. IMPRESSION: Normal CT head. RIGHT periorbital and premaxillary soft tissue swelling, contusion and small hematoma. No acute facial bone abnormalities. Electronically Signed   By: Lavonia Dana M.D.   On: 02/20/2021 17:51   DG Hip Unilat With Pelvis 2-3 Views Right  Addendum Date: 02/20/2021   ADDENDUM REPORT: 02/20/2021 15:36 ADDENDUM: Some sort a of rectangular radiopacities appear to overlie the right central abdomen. The technologist states that the patient was wearing a gown. Therefore, cannot rule out some sort of foreign object. This would be very unusual in appears to represent a chain of rectangular structures. Electronically Signed   By: Nelson Chimes M.D.   On: 02/20/2021 15:36   Result Date: 02/20/2021 CLINICAL  DATA:  Right thigh pain.  Tripped outside. EXAM: DG HIP (WITH OR WITHOUT PELVIS) 2-3V RIGHT COMPARISON:  None. FINDINGS: There is no evidence of hip fracture or dislocation. There is no evidence of arthropathy or other focal bone abnormality. IMPRESSION: Negative. Electronically Signed: By: Nelson Chimes M.D. On: 02/20/2021 15:33   DG FEMUR, MIN 2 VIEWS RIGHT  Result Date: 02/20/2021 CLINICAL DATA:  Tripped and fell.  Lump. EXAM: RIGHT FEMUR 2 VIEWS COMPARISON:  None. FINDINGS: No evidence of fracture or focal bone abnormality. No evidence of soft tissue lesion. Regional arterial calcification, commonly seen. IMPRESSION: No acute or traumatic finding. Regional arterial calcification. Electronically Signed   By: Nelson Chimes M.D.   On: 02/20/2021 15:35   CT Maxillofacial WO CM  Result Date: 02/20/2021 CLINICAL DATA:  [place] after mowing the yard, striking face on concrete, RIGHT cheek hematoma, RIGHT facial pain. History coronary artery disease, hypertension, type II diabetes mellitus, former smoker EXAM: CT HEAD WITHOUT CONTRAST CT MAXILLOFACIAL WITHOUT CONTRAST TECHNIQUE: Multidetector CT imaging of the head and maxillofacial structures were performed using the standard protocol without intravenous contrast. Multiplanar CT image reconstructions of the maxillofacial structures were also generated. Right side of face marked with BB. COMPARISON:  None FINDINGS: CT HEAD FINDINGS Brain: Normal ventricular morphology. No midline shift or mass effect. Normal appearance of brain parenchyma. No intracranial hemorrhage, mass lesion, evidence of acute infarction, or extra-axial fluid collection. Vascular: No hyperdense vessels Skull: Intact Other: N/A CT MAXILLOFACIAL FINDINGS Osseous: Osseous mineralization normal. Mandible intact with normal TMJ alignment. Zygomas intact. Orbits and paranasal sinuses intact. No facial bone fractures identified. Visualized cervical spine unremarkable. Orbits: Bony orbits intact.   Intraorbital soft tissue planes clear. Sinuses: No sinus opacification, air-fluid levels, or mucosal thickening. Small osteomas of the frontal sinus. Tiny mucosal retention cysts LEFT maxillary sinus.  Soft tissues: RIGHT premaxillary soft tissue swelling, contusion and small hematoma. Scattered soft tissue calcifications in the face and scalp bilaterally faded RIGHT periorbital hematoma/contusion. Atherosclerotic calcifications of internal carotid arteries at skull base. IMPRESSION: Normal CT head. RIGHT periorbital and premaxillary soft tissue swelling, contusion and small hematoma. No acute facial bone abnormalities. Electronically Signed   By: Lavonia Dana M.D.   On: 02/20/2021 17:51    Procedures Procedures   Medications Ordered in ED Medications  HYDROcodone-acetaminophen (NORCO/VICODIN) 5-325 MG per tablet 1 tablet (1 tablet Oral Given 02/20/21 1608)  ondansetron (ZOFRAN) tablet 4 mg (4 mg Oral Given 02/20/21 1607)    ED Course  I have reviewed the triage vital signs and the nursing notes.  Pertinent labs & imaging results that were available during my care of the patient were reviewed by me and considered in my medical decision making (see chart for details).    MDM Rules/Calculators/A&P                          History provided by patient with additional history obtained from chart review.    Presenting after mechanical fall. No prodrome of cardiac symptoms. He is non toxic appearing. Has hematoma to right cheek. No other head or neck injuries on exam.  By injury.  Tender to right hip and thigh without deformity. Right lower extremity is neurovascularly intact distally. Able to ambulate without assistance. Imagining ordered in MSE.  Patient given pain medicine and ice pack for facial hematoma.  CT head unremarkable. CT maxillofacial shows no acute facial bone abnormalities, he does have right periorbital and premaxillary soft tissue swelling and contusion with small hematoma. Xray of  right hip and femur without fracture or dislocation. He was wearing his jeans and had car keys in pocket to explain FB seen on plain films. Reassessed patient and pain has improved.  Feels he can manage his pain at home with Tylenol after shared decision making.  Recommend he have close follow-up with PCP in 1 to 2 days for recheck.   The patient appears reasonably screened and/or stabilized for discharge and I doubt any other medical condition or other [facility] requiring further screening, evaluation, or treatment in the ED at this time prior to discharge. The patient is safe for discharge with strict return precautions discussed.  Portions of this note were generated with [employer]. Dictation errors may occur despite best attempts at proofreading.   Final Clinical Impression(s) / ED Diagnoses Final diagnoses:  Fall, initial encounter    Rx / DC Orders ED Discharge Orders    None       Lewanda Rife 02/20/21 1847    Dorie Rank, MD 02/24/21 [phone]

## 2021-02-20 NOTE — ED Triage Notes (Signed)
Patient tripped over a wire and hit his face on the floor. Hematoma to right cheek. Pain to right face and right thigh. Denies LOC. Patient takes 81mg  aspirin daily, no anticoagulation. Patient alert, oriented, and in no apparent distress at this time.

## 2021-02-20 NOTE — ED Provider Notes (Signed)
  65 year old male that presents the emergency department today for fall, on aspirin.  Patient tripped over a wire this morning and hit his face to the floor, complaining of right cheek pain and right thigh/hip pain.  Alert and oriented x4, no LOC.  Normal neuro exam with normal gait.  We will obtain plain films of hip and femur on the right side, will also obtain CT head and maxillofacial.  Patient is stable, remainder of exam handed off to PA-C  K. Walisiewicz.   MSE was initiated and I personally evaluated the patient and placed orders (if any) at  3:15 PM on February 20, 2021.  The patient appears stable so that the remainder of the MSE may be completed by another provider.   Alfredia Client, PA-C 02/20/21 1518    Breck Coons, MD 02/23/21 [phone]

## 2021-02-20 NOTE — Telephone Encounter (Signed)
Rec'd call from the pt's spouse Hassan Rowan) that the patient fell while mowing the grass today and hit his head.  Please call back.

## 2021-02-24 ENCOUNTER — Encounter: Payer: Self-pay | Admitting: Internal Medicine

## 2021-02-24 ENCOUNTER — Ambulatory Visit (INDEPENDENT_AMBULATORY_CARE_PROVIDER_SITE_OTHER): Payer: Medicare HMO | Admitting: Internal Medicine

## 2021-02-24 DIAGNOSIS — S0512XD Contusion of eyeball and orbital tissues, left eye, subsequent encounter: Secondary | ICD-10-CM | POA: Diagnosis not present

## 2021-02-24 DIAGNOSIS — W19XXXD Unspecified fall, subsequent encounter: Secondary | ICD-10-CM | POA: Diagnosis not present

## 2021-02-24 MED ORDER — LIDOCAINE 5 % EX PTCH: 1.0000 | MEDICATED_PATCH | CUTANEOUS | 0 refills | Status: AC

## 2021-02-24 NOTE — Patient Instructions (Addendum)
To Mr. Kintz,  It was a pleasure to see you again, I am sorry to hear about your fall. Today we discussed your fall and subsequent bruising. For your pain, I would continue using the tylenol that I prescribed at our last visit. Additionally, I would use ice packs to help manage the pain. Additionally, I have placed lidocaine patches for you to pick up at your pharmacy. We will see you in a month's time. If you begin to have worsening vision, I would go to the nearest ED for evaluation, but your exam today was reassuring.  Have a good day.  [name], MD Rib Contusion A rib contusion is a deep bruise on the rib area. Contusions are the result of a blunt trauma that causes bleeding and injury to the tissues under the skin. A rib contusion may involve bruising of the ribs and of the skin and muscles in the area. The skin over the contusion may turn blue, purple, or yellow. Minor injuries result in a painless contusion. More severe contusions may be painful and swollen for a few weeks. What are the causes? This condition is usually caused by a hard, direct hit to an area of the body. This often occurs while playing contact sports. What are the signs or symptoms? Symptoms of this condition include:  Swelling and redness of the injured area.  Discoloration of the injured area.  Tenderness and soreness of the injured area.  Pain with or without movement.  Pain when breathing in. How is this diagnosed? This condition may be diagnosed based on:  Your symptoms and medical history.  A physical exam.  Imaging tests--such as an X-ray, CT scan, or MRI--to determine if there were internal injuries or broken bones (fractures). How is this treated? This condition may be treated with:  Rest. This is often the best treatment for a rib contusion.  Ice packs. This reduces swelling and inflammation.  Deep-breathing exercises. These may be recommended to reduce the risk for lung collapse and  pneumonia.  Medicines. Over-the-counter or prescription medicines may be given to control pain.  Injection of a numbing medicine around the nerve near your injury (nerve block). Follow these instructions at home: Medicines  Take over-the-counter and prescription medicines only as told by your health care provider.  Ask your health care provider if the medicine prescribed to you: ? Requires you to avoid driving or using machinery. ? Can cause constipation. You may need to take these actions to prevent or treat constipation:  Drink enough fluid to keep your urine pale yellow.  Take over-the-counter or prescription medicines.  Eat foods that are high in fiber, such as beans, whole grains, and fresh fruits and vegetables.  Limit foods that are high in fat and processed sugars, such as fried or sweet foods. Managing pain, stiffness, and swelling If directed, put ice on the injured area. To do this:  Put ice in a plastic bag.  Place a towel between your skin and the bag.  Leave the ice on for 20 minutes, 2-3 times a day.  Remove the ice if your skin turns bright red. This is very important. If you cannot feel pain, heat, or cold, you have a greater risk of damage to the area.   Activity  Rest the injured area.  Avoid strenuous activity and any activities or movements that cause pain. Be careful during activities, and avoid bumping the injured area.  Do not lift anything that is heavier than 5 lb (2.3  kg), or the limit that you are told, until your health care provider says that it is safe. General instructions  Do not use any products that contain nicotine or tobacco, such as cigarettes, e-cigarettes, and chewing tobacco. These can delay healing. If you need help quitting, ask your health care provider.  Do deep-breathing exercises as told by your health care provider.  If you were given an incentive spirometer, use it every 1-2 hours while you are awake, or as recommended by  your health care provider. This device measures how well you are filling your lungs with each breath.  Keep all follow-up visits. This is important.   Contact a health care provider if you have:  Increased bruising or swelling.  Pain that is not controlled with treatment.  A fever. Get help right away if you:  Have difficulty breathing or shortness of breath.  Develop a continual cough, or you cough up thick or bloody mucus from your lungs (sputum).  Feel nauseous or you vomit.  Have pain in your abdomen. These symptoms may represent a serious problem that is an emergency. Do not wait to see if the symptoms will go away. Get medical help right away. Call your local emergency services (911 in the [place] Do not drive yourself to the hospital. Summary  A rib contusion is a deep bruise on your rib area. Contusions are the result of a blunt trauma that causes bleeding and injury to the tissues under the skin.  The skin over the contusion may turn blue, purple, or yellow. Minor injuries may cause a painless contusion. More severe contusions may be painful and swollen for a few weeks.  Rest the injured area. Avoid strenuous activity and any activities or movements that cause pain. This information is not intended to replace advice given to you by your health care provider. Make sure you discuss any questions you have with your health care provider. Document Revised: 02/20/2020 Document Reviewed: 02/20/2020 Elsevier Patient Education  [place].  Rib Contusion A rib contusion is a deep bruise on the rib area. Contusions are the result of a blunt trauma that causes bleeding and injury to the tissues under the skin. A rib contusion may involve bruising of the ribs and of the skin and muscles in the area. The skin over the contusion may turn blue, purple, or yellow. Minor injuries result in a painless contusion. More severe contusions may be painful and swollen for a few weeks. What  are the causes? This condition is usually caused by a hard, direct hit to an area of the body. This often occurs while playing contact sports. What are the signs or symptoms? Symptoms of this condition include:  Swelling and redness of the injured area.  Discoloration of the injured area.  Tenderness and soreness of the injured area.  Pain with or without movement.  Pain when breathing in. How is this diagnosed? This condition may be diagnosed based on:  Your symptoms and medical history.  A physical exam.  Imaging tests--such as an X-ray, CT scan, or MRI--to determine if there were internal injuries or broken bones (fractures). How is this treated? This condition may be treated with:  Rest. This is often the best treatment for a rib contusion.  Ice packs. This reduces swelling and inflammation.  Deep-breathing exercises. These may be recommended to reduce the risk for lung collapse and pneumonia.  Medicines. Over-the-counter or prescription medicines may be given to control pain.  Injection of a numbing  medicine around the nerve near your injury (nerve block). Follow these instructions at home: Medicines  Take over-the-counter and prescription medicines only as told by your health care provider.  Ask your health care provider if the medicine prescribed to you: ? Requires you to avoid driving or using machinery. ? Can cause constipation. You may need to take these actions to prevent or treat constipation:  Drink enough fluid to keep your urine pale yellow.  Take over-the-counter or prescription medicines.  Eat foods that are high in fiber, such as beans, whole grains, and fresh fruits and vegetables.  Limit foods that are high in fat and processed sugars, such as fried or sweet foods. Managing pain, stiffness, and swelling If directed, put ice on the injured area. To do this:  Put ice in a plastic bag.  Place a towel between your skin and the bag.  Leave the  ice on for 20 minutes, 2-3 times a day.  Remove the ice if your skin turns bright red. This is very important. If you cannot feel pain, heat, or cold, you have a greater risk of damage to the area.   Activity  Rest the injured area.  Avoid strenuous activity and any activities or movements that cause pain. Be careful during activities, and avoid bumping the injured area.  Do not lift anything that is heavier than 5 lb (2.3 kg), or the limit that you are told, until your health care provider says that it is safe. General instructions  Do not use any products that contain nicotine or tobacco, such as cigarettes, e-cigarettes, and chewing tobacco. These can delay healing. If you need help quitting, ask your health care provider.  Do deep-breathing exercises as told by your health care provider.  If you were given an incentive spirometer, use it every 1-2 hours while you are awake, or as recommended by your health care provider. This device measures how well you are filling your lungs with each breath.  Keep all follow-up visits. This is important.   Contact a health care provider if you have:  Increased bruising or swelling.  Pain that is not controlled with treatment.  A fever. Get help right away if you:  Have difficulty breathing or shortness of breath.  Develop a continual cough, or you cough up thick or bloody mucus from your lungs (sputum).  Feel nauseous or you vomit.  Have pain in your abdomen. These symptoms may represent a serious problem that is an emergency. Do not wait to see if the symptoms will go away. Get medical help right away. Call your local emergency services (911 in the [place] Do not drive yourself to the hospital. Summary  A rib contusion is a deep bruise on your rib area. Contusions are the result of a blunt trauma that causes bleeding and injury to the tissues under the skin.  The skin over the contusion may turn blue, purple, or yellow. Minor injuries  may cause a painless contusion. More severe contusions may be painful and swollen for a few weeks.  Rest the injured area. Avoid strenuous activity and any activities or movements that cause pain. This information is not intended to replace advice given to you by your health care provider. Make sure you discuss any questions you have with your health care provider. Document Revised: 02/20/2020 Document Reviewed: 02/20/2020 Elsevier Patient Education  [place].

## 2021-02-25 ENCOUNTER — Telehealth: Payer: Self-pay | Admitting: *Deleted

## 2021-02-25 DIAGNOSIS — W19XXXA Unspecified fall, initial encounter: Secondary | ICD-10-CM | POA: Insufficient documentation

## 2021-02-25 NOTE — Progress Notes (Signed)
CC: ED follow up visit/Fall  HPI:  [name] is a 65 y.o. M/F, with a PMH noted below, who presents to the clinic for an ED follow up visit after falling at home. To see the management of their acute and chronic conditions, please see the A&P note under the Encounters tab.   Past Medical History:  Diagnosis Date  . Atrial arrhythmia   . CAP (community acquired pneumonia)   . Cellulitis of knee, right 06/06/12  . Chronic otitis externa 06/06/12   C Multiple trials with antibiocs and antifungal. Cultures were positive for Pseudomonas. Follow up with ENT on 06/14/12    . CORONARY ARTERY DISEASE 11/25/2006   Cardiac cath by Dr Terrence Dupont 11/2003 LV showed good LV systolic function, EF of 16-10%. Left main was patent. LAD has 20-30% mid stenosis. Diagonal 1 and diagonal 2 were patent. Left circumflex was patent. OM1 was less than 0.5 mm which was diffusely diseased. OM2 has 20% ostial stenosis which was patent which  was moderate size. OM3 was patent at prior PTCA and stented site. RCA has 20-30% proximal   . Hypertension   . NSTEMI (non-ST elevated myocardial infarction) ([place]) 08/14/2012   S/p Successful PTCA to proximal OM-3     . TOBACCO ABUSE 11/25/2006  . Type II diabetes mellitus ([place])    Review of Systems:   Review of Systems  Constitutional: Negative for chills, fever, malaise/fatigue and weight loss.  Eyes: Negative for blurred vision, double vision, photophobia, pain and discharge.  Respiratory: Negative for cough and hemoptysis.   Gastrointestinal: Negative for abdominal pain, blood in stool, constipation, diarrhea, nausea and vomiting.  Musculoskeletal: Positive for falls. Negative for back pain, myalgias and neck pain.  Neurological: Negative for dizziness, tingling, tremors, focal weakness, loss of consciousness, weakness and headaches.     Physical Exam:  Vitals:   02/24/21 1450  BP: 135/68  Pulse: 87  Temp: 98.2 F (36.8 C)  TempSrc: Oral  SpO2: 100%  Weight: 243  lb 1.6 oz (110.3 kg)  Height: 6\' 3"  (1.905 m)   Physical Exam Constitutional:      General: He is not in acute distress.    Appearance: Normal appearance. He is not ill-appearing, toxic-appearing or diaphoretic.  Eyes:     General: No scleral icterus.       Right eye: No discharge.        Left eye: No discharge.     Extraocular Movements: Extraocular movements intact.     Conjunctiva/sclera: Conjunctivae normal.     Pupils: Pupils are equal, round, and reactive to light.     Comments: Ecchymosis of R orbit.    Cardiovascular:     Rate and Rhythm: Normal rate and regular rhythm.     Pulses: Normal pulses.     Heart sounds: Normal heart sounds. No murmur heard. No friction rub. No gallop.   Pulmonary:     Effort: Pulmonary effort is normal.     Breath sounds: Normal breath sounds. No wheezing, rhonchi or rales.  Musculoskeletal:        General: No tenderness.  Skin:    Findings: Bruising present.     Comments: Bruises on the R thigh and Lateral R ribs  Neurological:     Mental Status: He is alert and oriented to person, place, and time.     Cranial Nerves: No cranial nerve deficit.      Assessment & Plan:   See Encounters Tab for problem based charting.  Patient discussed with Dr. Heber [place]

## 2021-02-25 NOTE — Telephone Encounter (Signed)
PA for Lidocaine Patches 5% sent to CoverMyMeds.  Approved 11/29/2020 thru 11/28/2021.  Sander Nephew, RN 02/25/2021 3:11 PM.

## 2021-02-25 NOTE — Assessment & Plan Note (Signed)
Patient presents to the office after a fall from home after tripping over the weed wacker wires on 02/20/21. He fell onto the concrete on the L side. He did not lose consciousness during this event. He presented to the ED and his CT head was negative as was his CT maxillofacial. He additionally under went imaging of his hip and femur,which were negative for acute pathology. He was stable and discharged home.   A/P:  [name] presents to the clinic for follow up after a fall. Patient presents today with pain on the side that he fell. His vision is well on exam and EOM are intact bilaterally. We discussed pain relief strategies including icing down his affected areas. He is using tylenol from his previous visit which we will continue. Additionally will provide lidocaine patches at today's visit to ease his pain.   - Lidocaine 5% patches BID PRN - Conservative measures, education sheet provided. - F/U in one month

## 2021-02-28 NOTE — Progress Notes (Signed)
Internal Medicine Clinic Attending ? ?Case discussed with Dr. Winters  At the time of the visit.  We reviewed the resident?s history and exam and pertinent patient test results.  I agree with the assessment, diagnosis, and plan of care documented in the resident?s note.  ?

## 2021-03-03 ENCOUNTER — Encounter: Payer: Self-pay | Admitting: *Deleted

## 2021-03-03 NOTE — Progress Notes (Unsigned)

## 2021-03-06 ENCOUNTER — Other Ambulatory Visit: Payer: Self-pay | Admitting: Student

## 2021-03-06 NOTE — Progress Notes (Signed)
Things That May Be Affecting Your Health:  Alcohol  Hearing loss X Pain    Depression  Home Safety  Sexual Health   Diabetes  Lack of physical activity  Stress   Difficulty with daily activities  Loneliness  Tiredness   Drug use  Medicines X Tobacco use  X Falls  Motor Vehicle Safety  Weight   Food choices  Oral Health  Other    YOUR PERSONALIZED HEALTH PLAN : 1. Schedule your next subsequent Medicare Wellness visit in one year 2. Attend all of your regular appointments to address your medical issues 3. Complete the preventative screenings and services   Annual Wellness Visit   Medicare Covered Preventative Screenings and [place] Men and Women Who How Often Need? Date of Last Service Action  Abdominal Aortic Aneurysm Adults with AAA risk factors Once No  N/A He will need to be screen for this when he is 24.   Alcohol Misuse and Counseling All Adults Screening once a year if no alcohol misuse. Counseling up to 4 face to face sessions. No    Bone Density Measurement  Adults at risk for osteoporosis Once every 2 yrs No     Lipid Panel Z13.6 All adults without CV disease Once every 5 yrs No  02/18/21    Colorectal Cancer   Stool sample or  Colonoscopy All adults 71 and older   Once every year  Every 10 years    05/25/17  Tentatively schedule for next one on 05/25/2022  Depression All Adults Once a year YES Today   Diabetes Screening Blood glucose, post glucose load, or GTT Z13.1  All adults at risk  Pre-diabetics  Once per year  Twice per year      Diabetes  Self-Management Training All adults Diabetics 10 hrs first year; 2 hours subsequent years. Requires Copay YES  Needs diabetic eye exam  Glaucoma  Diabetics  Family history of glaucoma  African Americans 66 yrs +  Hispanic Americans 24 yrs + Annually - requires coppay YES   Needs eye exam  Hepatitis C Z72.89 or F19.20  High Risk for HCV  Born between 1945 and 1965  Annually  Once  No     HIV Z11.4 All adults based on risk  Annually btw ages 44 & 64 regardless of risk  Annually > 65 yrs if at increased risk No     Lung Cancer Screening Asymptomatic adults aged 48-77 with 75 pack yr history and current smoker OR quit within the last 15 yrs Annually Must have counseling and shared decision making documentation before first screen      Medical Nutrition Therapy Adults with   Diabetes  Renal disease  Kidney transplant within past 3 yrs 3 hours first year; 2 hours subsequent years     Obesity and Counseling All adults Screening once a year Counseling if BMI 30 or higher  Today   Tobacco Use Counseling Adults who use tobacco  Up to 8 visits in one year     Vaccines Z23  Hepatitis B  Influenza   Pneumonia  Adults   Once  Once every flu season  Two different vaccines separated by one year     Next Annual Wellness Visit People with Medicare Every year  Today     Services & Screenings Women Who How Often Need  Date of Last Service Action  Mammogram  Z12.31 Women over 75 One baseline ages 39-39. Annually ager 40 yrs+  Pap tests All women Annually if high risk. Every 2 yrs for normal risk women      Screening for cervical cancer with   Pap (Z01.419 nl or Z01.411abnl) &  HPV Z11.51 Women aged 73 to 33 Once every 5 yrs     Screening pelvic and breast exams All women Annually if high risk. Every 2 yrs for normal risk women     Sexually Transmitted Diseases  Chlamydia  Gonorrhea  Syphilis All at risk adults Annually for non pregnant females at increased risk         [place] Men Who How Ofter Need  Date of Last Service Action  Prostate Cancer - DRE & PSA Men over 50 Annually.  DRE might require a copay.        Sexually Transmitted Diseases  Syphilis All at risk adults Annually for men at increased risk      Health Maintenance List Health Maintenance  Topic Date Due  . OPHTHALMOLOGY EXAM  11/08/2019  . COVID-19  Vaccine (3 - Booster for Pfizer series) 09/02/2020  . HEMOGLOBIN A1C  05/21/2021  . INFLUENZA VACCINE  06/29/2021  . FOOT EXAM  02/18/2022  . LIPID PANEL  02/18/2022  . COLONOSCOPY (Pts 45-35yrs Insurance coverage will need to be confirmed)  05/25/2022  . TETANUS/TDAP  08/06/2030  . Hepatitis C Screening  Completed  . HIV Screening  Completed  . HPV VACCINES  Aged Out

## 2021-03-29 ENCOUNTER — Other Ambulatory Visit: Payer: Self-pay | Admitting: Student

## 2021-03-29 DIAGNOSIS — G8929 Other chronic pain: Secondary | ICD-10-CM

## 2021-04-30 ENCOUNTER — Other Ambulatory Visit: Payer: Self-pay | Admitting: Student

## 2021-04-30 DIAGNOSIS — M5416 Radiculopathy, lumbar region: Secondary | ICD-10-CM

## 2021-04-30 DIAGNOSIS — G8929 Other chronic pain: Secondary | ICD-10-CM

## 2021-05-01 ENCOUNTER — Other Ambulatory Visit: Payer: Self-pay | Admitting: Student

## 2021-05-01 DIAGNOSIS — G8929 Other chronic pain: Secondary | ICD-10-CM

## 2021-05-01 DIAGNOSIS — M5416 Radiculopathy, lumbar region: Secondary | ICD-10-CM

## 2021-05-07 ENCOUNTER — Other Ambulatory Visit: Payer: Self-pay | Admitting: Student

## 2021-05-07 DIAGNOSIS — G8929 Other chronic pain: Secondary | ICD-10-CM

## 2021-06-09 ENCOUNTER — Other Ambulatory Visit: Payer: Self-pay | Admitting: Student

## 2021-07-10 ENCOUNTER — Other Ambulatory Visit: Payer: Self-pay | Admitting: Student

## 2021-07-10 DIAGNOSIS — G8929 Other chronic pain: Secondary | ICD-10-CM

## 2021-08-06 ENCOUNTER — Telehealth: Payer: Self-pay | Admitting: Internal Medicine

## 2021-08-06 NOTE — Telephone Encounter (Signed)
Pt calling to report that he and his wife tested positive for COVID-19.  Pt states his whole body is aching and he has a bad cough and wants to know what to do now.

## 2021-08-06 NOTE — Telephone Encounter (Signed)
Returned call to patient. States he took a home test today and it was positive for Covid. He had received first 2 vaccines but not the booster. Symptoms include body aches, slight weakness, and productive cough. He does not know color of sputum. Denies fever, SHOB. Advised plenty of fluids, especially water, plenty of rest, OTC pain meds as needed. Instructed to isolate for 5 days from onset of symptoms and to wear mask when out for additional 5 days. He is aware to head directly to ED if develops CP, or SHOB at rest. He was very appreciative.

## 2021-09-10 ENCOUNTER — Other Ambulatory Visit: Payer: Self-pay | Admitting: Internal Medicine

## 2021-09-10 DIAGNOSIS — G8929 Other chronic pain: Secondary | ICD-10-CM

## 2021-09-10 DIAGNOSIS — M5416 Radiculopathy, lumbar region: Secondary | ICD-10-CM

## 2021-09-21 ENCOUNTER — Other Ambulatory Visit: Payer: Self-pay | Admitting: Internal Medicine

## 2021-09-21 DIAGNOSIS — E785 Hyperlipidemia, unspecified: Secondary | ICD-10-CM

## 2021-10-11 ENCOUNTER — Other Ambulatory Visit: Payer: Self-pay | Admitting: Internal Medicine

## 2021-10-11 ENCOUNTER — Other Ambulatory Visit: Payer: Self-pay | Admitting: Student

## 2021-10-11 DIAGNOSIS — K219 Gastro-esophageal reflux disease without esophagitis: Secondary | ICD-10-CM

## 2021-11-16 ENCOUNTER — Other Ambulatory Visit: Payer: Self-pay | Admitting: Internal Medicine

## 2021-11-16 DIAGNOSIS — M5416 Radiculopathy, lumbar region: Secondary | ICD-10-CM

## 2021-11-16 NOTE — Telephone Encounter (Signed)
Next appt scheduled 11/24/21 with PCP.

## 2021-11-24 ENCOUNTER — Encounter: Payer: Self-pay | Admitting: Internal Medicine

## 2021-11-24 ENCOUNTER — Ambulatory Visit (INDEPENDENT_AMBULATORY_CARE_PROVIDER_SITE_OTHER): Payer: Medicare HMO | Admitting: Internal Medicine

## 2021-11-24 VITALS — BP 132/65 | HR 60 | Temp 97.6°F | Ht 75.0 in | Wt 244.2 lb

## 2021-11-24 DIAGNOSIS — Z9189 Other specified personal risk factors, not elsewhere classified: Secondary | ICD-10-CM

## 2021-11-24 DIAGNOSIS — E1121 Type 2 diabetes mellitus with diabetic nephropathy: Secondary | ICD-10-CM

## 2021-11-24 DIAGNOSIS — E785 Hyperlipidemia, unspecified: Secondary | ICD-10-CM | POA: Diagnosis not present

## 2021-11-24 DIAGNOSIS — I10 Essential (primary) hypertension: Secondary | ICD-10-CM | POA: Diagnosis not present

## 2021-11-24 LAB — POCT GLYCOSYLATED HEMOGLOBIN (HGB A1C): Hemoglobin A1C: 6.6 % — AB (ref 4.0–5.6)

## 2021-11-24 LAB — GLUCOSE, CAPILLARY: Glucose-Capillary: 144 mg/dL — ABNORMAL HIGH (ref 70–99)

## 2021-11-24 MED ORDER — EMPAGLIFLOZIN 10 MG PO TABS: 10.0000 mg | ORAL_TABLET | Freq: Every day | ORAL | 3 refills | Status: DC

## 2021-11-24 MED ORDER — ROSUVASTATIN CALCIUM 40 MG PO TABS: 40.0000 mg | ORAL_TABLET | Freq: Every day | ORAL | 0 refills | Status: DC

## 2021-11-24 MED ORDER — METFORMIN HCL 1000 MG PO TABS
1000.0000 mg | ORAL_TABLET | Freq: Two times a day (BID) | ORAL | 3 refills | Status: DC
Start: 2021-11-24 — End: 2022-04-27

## 2021-11-24 MED ORDER — LISINOPRIL 10 MG PO TABS: 10.0000 mg | ORAL_TABLET | Freq: Every day | ORAL | 3 refills | Status: DC

## 2021-11-24 NOTE — Assessment & Plan Note (Signed)
While discussing patient's medications today, patient was unsure medications and was taking.  We discussed a pharmacy referral medication reconciliation the patient is amenable. - Ambulatory referral to clinical pharmacist

## 2021-11-24 NOTE — Patient Instructions (Addendum)
To [name],  It was a pleasure seeing you today! Today we discussed your diabetes and medications. For your Diabetes, I will refill your metformin and Jardiance today. We will additionally reach out to our clinical pharmacist to assist with making sure we are on the right medications. Your A1c looks great today, so continue taking your medications as indicated. I am additionally checking your blood work to assess your kidney function and I will call you with the results.  Have a Happy New Year! [name], MD

## 2021-11-24 NOTE — Assessment & Plan Note (Addendum)
Patient presents today for follow-up to diabetes.  He is currently taking Jardiance 10 mg and metformin 1000 mg twice daily. His repeat A1c today is 6.6 from 7.  He is compliant with his medications, he has not Side effects. - Continue current regimen - Follow-up A1c in 6 months - Creatinine elevated at last visit, will reassess with BMP - Refill medications.  Addendum:  Called patient back with results.

## 2021-11-24 NOTE — Progress Notes (Signed)
° °  CC: Diabetes Follow Up  HPI:  [name] is a 65 y.o. person, with a PMH noted below, who presents to the clinic diabetes follow-up. To see the management of their acute and chronic conditions, please see the A&P note under the Encounters tab.   Past Medical History:  Diagnosis Date   Atrial arrhythmia    CAP (community acquired pneumonia)    Cellulitis of knee, right 06/06/12   Chronic otitis externa 06/06/12   C Multiple trials with antibiocs and antifungal. Cultures were positive for Pseudomonas. Follow up with ENT on 06/14/12     CORONARY ARTERY DISEASE 11/25/2006   Cardiac cath by Dr Terrence Dupont 11/2003 LV showed good LV systolic function, EF of 69-62%. Left main was patent. LAD has 20-30% mid stenosis. Diagonal 1 and diagonal 2 were patent. Left circumflex was patent. OM1 was less than 0.5 mm which was diffusely diseased. OM2 has 20% ostial stenosis which was patent which  was moderate size. OM3 was patent at prior PTCA and stented site. RCA has 20-30% proximal    Hypertension    NSTEMI (non-ST elevated myocardial infarction) ([place]) 08/14/2012   S/p Successful PTCA to proximal OM-3      TOBACCO ABUSE 11/25/2006   Type II diabetes mellitus ([place])    Review of Systems:   Review of Systems  Constitutional:  Negative for chills, fever and weight loss.  Eyes:  Negative for blurred vision, double vision, photophobia and pain.  Cardiovascular:  Negative for chest pain.  Gastrointestinal:  Negative for abdominal pain, diarrhea, nausea and vomiting.    Physical Exam:  Vitals:   11/24/21 1322  BP: 132/65  Pulse: 60  Temp: 97.6 F (36.4 C)  TempSrc: Oral  SpO2: 100%  Weight: 244 lb 3.2 oz (110.8 kg)  Height: 6\' 3"  (1.905 m)   Physical Exam Vitals reviewed.  Constitutional:      General: He is not in acute distress.    Appearance: Normal appearance. He is not ill-appearing or toxic-appearing.  HENT:     Head: Normocephalic and atraumatic.  Cardiovascular:     Rate and Rhythm:  Normal rate and regular rhythm.     Pulses: Normal pulses.     Heart sounds: Normal heart sounds. No murmur heard.   No friction rub. No gallop.  Pulmonary:     Effort: Pulmonary effort is normal.     Breath sounds: Normal breath sounds. No wheezing, rhonchi or rales.  Abdominal:     General: Bowel sounds are normal.     Palpations: Abdomen is soft.     Tenderness: There is no abdominal tenderness. There is no guarding.  Musculoskeletal:        General: No swelling.     Right lower leg: No edema.     Left lower leg: No edema.  Neurological:     General: No focal deficit present.     Mental Status: He is alert and oriented to person, place, and time.  Psychiatric:        Mood and Affect: Mood normal.        Behavior: Behavior normal.     Assessment & Plan:   See Encounters Tab for problem based charting.  Patient discussed with Dr. Jimmye Norman

## 2021-11-25 LAB — BMP8+ANION GAP
Anion Gap: 14 mmol/L (ref 10.0–18.0)
BUN/Creatinine Ratio: 20 (ref 10–24)
BUN: 19 mg/dL (ref 8–27)
CO2: 20 mmol/L (ref 20–29)
Calcium: 9.1 mg/dL (ref 8.6–10.2)
Chloride: 104 mmol/L (ref 96–106)
Creatinine, Ser: 0.96 mg/dL (ref 0.76–1.27)
Glucose: 142 mg/dL — ABNORMAL HIGH (ref 70–99)
Potassium: 4.4 mmol/L (ref 3.5–5.2)
Sodium: 138 mmol/L (ref 134–144)
eGFR: 88 mL/min/{1.73_m2} (ref >59)

## 2021-12-02 NOTE — Progress Notes (Signed)
Internal Medicine Clinic Attending ? ?Case discussed with Dr. Winters  At the time of the visit.  We reviewed the resident?s history and exam and pertinent patient test results.  I agree with the assessment, diagnosis, and plan of care documented in the resident?s note.  ?

## 2021-12-24 ENCOUNTER — Ambulatory Visit (INDEPENDENT_AMBULATORY_CARE_PROVIDER_SITE_OTHER): Payer: Medicare HMO | Admitting: Pharmacist

## 2021-12-24 DIAGNOSIS — Z79899 Other long term (current) drug therapy: Secondary | ICD-10-CM

## 2021-12-24 NOTE — Progress Notes (Signed)
° °  Subjective:    Patient ID: [name], male    DOB: Apr 12, 1956, 66 y.o.   MRN: [id number]  HPI  Patient is a 66 y.o. male who presents for medication review and management. He is in good spirits and presents without assistance. Patient was referred and last seen by Primary Care Provider on 11/24/21.  Medication Adherence Questionnaire (A score of 2 or more points indicates risk for nonadherence)  Do you know what each of your medicines is for? 1 (1 point if no)  Do you ever have trouble remembering to take your medicine? 0; uses pill organizer (2 points if yes)  Do you ever not take a medicine because you feel you do not need it?  0 (1 point if yes)  Do you think that any of your medicines is not helping you? 0 (1 point if yes)  Do you have any physical problems such as vision loss that keep you from taking your medicines as prescribed?  0 (2 points if yes)  Do you think any of your medicine is causing a side effect? 0 (1 point if yes)  Do you know the names of ALL of your medicines? 1 (1 point if no)  Do you think that you need ALL of your medicines? 1 (1 point if no)  In the past 6 months, have you missed getting a refill or a new prescription filled on time? 0 (1 point if yes)  How often do you miss taking a dose of medicine? 0 Never (0 points), 1 or 2 times a month (1 points), 1 time a week (2 points), 2 or more times a week (3 points).   TOTAL SCORE 3/14    Objective:   Labs:   Physical Exam Neurological:     Mental Status: He is alert and oriented to person, place, and time.    Assessment/Plan:   Understanding of regimen: good  Understanding of indications: good  Potential of compliance: good  Patient has known adherence challenges based on score of 3 for questionnaire. Barriers include: lack of knowledge. After discussion patient understands he needs to continue on all his chronic medications. Medication list reviewed and updated. Patient requesting refill of  lidocaine patch and tramadol for back pain. Will route to PCP. Patient was provided with a printed medication list.   Follow-up appointment with PCP as needed. Written patient instructions provided.  This appointment required 20 minutes of direct patient care.  Thank you for involving pharmacy to assist in providing this patient's care.

## 2021-12-24 NOTE — Patient Instructions (Signed)
Mr. Boschert it was a pleasure seeing you today.   Today we reviewed all of the medications you are currently taking. Included is an updated medication list. Please continue taking all medications as prescribed on this list.  If you have any questions please call the clinic and ask to speak with me.  Follow-up with Dr. Gilford Rile as needed

## 2021-12-26 ENCOUNTER — Other Ambulatory Visit: Payer: Self-pay | Admitting: Internal Medicine

## 2021-12-26 DIAGNOSIS — K219 Gastro-esophageal reflux disease without esophagitis: Secondary | ICD-10-CM

## 2022-02-25 ENCOUNTER — Ambulatory Visit: Payer: Medicare HMO | Admitting: Internal Medicine

## 2022-02-25 NOTE — Progress Notes (Deleted)
Subjective:   [name] is a 66 y.o. male who presents for an Initial Medicare Annual Wellness Visit. I connected with  [name] on 02/25/22 by a audio enabled telemedicine application and verified that I am speaking with the correct person using two identifiers.  Patient Location: Home  Provider Location: Office/Clinic  I discussed the limitations of evaluation and management by telemedicine. The patient expressed understanding and agreed to proceed.   Review of Systems    Deferred to PCP        Objective:    There were no vitals filed for this visit. There is no height or weight on file to calculate BMI.     11/24/2021    1:21 PM 02/24/2021    2:55 PM 02/18/2021    1:43 PM 08/06/2020    2:03 PM 10/04/2019    9:39 AM 02/19/2019    8:45 AM 12/23/2018    6:42 AM  Advanced Directives  Does Patient Have a Medical Advance Directive? No No No No No No No  Would patient like information on creating a medical advance directive? No - Patient declined No - Patient declined No - Patient declined No - Patient declined No - Patient declined No - Patient declined No - Patient declined    Current Medications (verified) Outpatient Encounter Medications as of 02/25/2022  Medication Sig   acetaminophen (TYLENOL) 500 MG tablet Take 1 tablet (500 mg total) by mouth every 6 (six) hours as needed. (Patient not taking: Reported on 12/24/2021)   aspirin EC 81 MG EC tablet Take 1 tablet (81 mg total) by mouth daily.   clopidogrel (PLAVIX) 75 MG tablet Take 1 tablet (75 mg total) by mouth daily.   Cyanocobalamin (VITAMIN B-12 CR PO) Take by mouth.   empagliflozin (JARDIANCE) 10 MG TABS tablet Take 1 tablet (10 mg total) by mouth daily.   gabapentin (NEURONTIN) 600 MG tablet TAKE 1 TABLET BY MOUTH THREE TIMES DAILY   isosorbide mononitrate (IMDUR) 30 MG 24 hr tablet Take 1 tablet by mouth once daily   lidocaine (LIDODERM) 5 % Place 1 patch onto the skin daily. Remove & Discard patch within 12  hours or as directed by MD (Patient not taking: Reported on 12/24/2021)   lisinopril (ZESTRIL) 10 MG tablet Take 1 tablet (10 mg total) by mouth daily.   metFORMIN (GLUCOPHAGE) 1000 MG tablet Take 1 tablet (1,000 mg total) by mouth 2 (two) times daily with a meal.   nitroGLYCERIN (NITROSTAT) 0.4 MG SL tablet DISSOLVE ONE TABLET UNDER THE TONGUE EVERY 5 MINUTES AS NEEDED FOR CHEST PAIN.  DO NOT EXCEED A TOTAL OF 3 DOSES IN 15 MINUTES (Patient not taking: Reported on 12/24/2021)   Omega-3 Fatty Acids (FISH OIL PO) Take by mouth.   pantoprazole (PROTONIX) 40 MG tablet Take 1 tablet by mouth once daily   rosuvastatin (CRESTOR) 40 MG tablet Take 1 tablet (40 mg total) by mouth at bedtime.   sotalol (BETAPACE) 80 MG tablet Take 1/2 (one-half) tablet by mouth twice daily   traMADol (ULTRAM) 50 MG tablet TAKE 1 TABLET BY MOUTH EVERY 6 HOURS AS NEEDED (Patient not taking: Reported on 12/24/2021)   No facility-administered encounter medications on file as of 02/25/2022.    Allergies (verified) Clindamycin/lincomycin   History: Past Medical History:  Diagnosis Date   Atrial arrhythmia    CAP (community acquired pneumonia)    Cellulitis of knee, right 06/06/12   Chronic otitis externa 06/06/12   C Multiple trials  with antibiocs and antifungal. Cultures were positive for Pseudomonas. Follow up with ENT on 06/14/12     CORONARY ARTERY DISEASE 11/25/2006   Cardiac cath by Dr Terrence Dupont 11/2003 LV showed good LV systolic function, EF of 0000000. Left main was patent. LAD has 20-30% mid stenosis. Diagonal 1 and diagonal 2 were patent. Left circumflex was patent. OM1 was less than 0.5 mm which was diffusely diseased. OM2 has 20% ostial stenosis which was patent which  was moderate size. OM3 was patent at prior PTCA and stented site. RCA has 20-30% proximal    Hypertension    NSTEMI (non-ST elevated myocardial infarction) ([place]) 08/14/2012   S/p Successful PTCA to proximal OM-3      TOBACCO ABUSE 11/25/2006   Type II  diabetes mellitus ([place])    Past Surgical History:  Procedure Laterality Date   CORONARY ANGIOPLASTY WITH STENT PLACEMENT  ~ 2005   "1"   CORONARY STENT INTERVENTION N/A 12/25/2018   Procedure: CORONARY STENT INTERVENTION;  Surgeon: Lorretta Harp, MD;  Location: [place];  Service: Cardiovascular;  Laterality: N/A;   CYSTECTOMY  1990's   LUA   LEFT HEART CATH AND CORONARY ANGIOGRAPHY N/A 12/25/2018   Procedure: LEFT HEART CATH AND CORONARY ANGIOGRAPHY;  Surgeon: Charolette Forward, MD;  Location: [place] [facility];  Service: Cardiovascular;  Laterality: N/A;   LEFT HEART CATHETERIZATION WITH CORONARY ANGIOGRAM N/A 08/15/2012   Procedure: LEFT HEART CATHETERIZATION WITH CORONARY ANGIOGRAM;  Surgeon: Clent Demark, MD;  Location: [facility];  Service: Cardiovascular;  Laterality: N/A;   PERCUTANEOUS CORONARY STENT INTERVENTION (PCI-S)  08/15/2012   Procedure: PERCUTANEOUS CORONARY STENT INTERVENTION (PCI-S);  Surgeon: Clent Demark, MD;  Location: [facility];  Service: Cardiovascular;;   Family History  Problem Relation Age of Onset   Cancer Mother    Social History   Socioeconomic History   Marital status: Married    Spouse name: Not on file   Number of children: Not on file   Years of education: Not on file   Highest education level: Not on file  Occupational History   Not on file  Tobacco Use   Smoking status: Former    Packs/day: 0.20    Years: 12.00    Pack years: 2.40    Types: Cigarettes    Quit date: 06/29/2018    Years since quitting: 3.6   Smokeless tobacco: Never   Tobacco comments:    3 per day   Vaping Use   Vaping Use: Never used  Substance and Sexual Activity   Alcohol use: Yes    Alcohol/week: 0.0 standard drinks    Comment: socially   Drug use: No    Comment: former marijuana   Sexual activity: Not on file  Other Topics Concern   Not on file  Social History Narrative   Not on file   Social Determinants of Health   Financial Resource  Strain: Not on file  Food Insecurity: Not on file  Transportation Needs: Not on file  Physical Activity: Not on file  Stress: Not on file  Social Connections: Not on file    Tobacco Counseling Counseling given: Not Answered Tobacco comments: 3 per day    Clinical Intake:                 Diabetic  yes          Activities of Daily Living    11/24/2021    1:21 PM  In your present  state of health, do you have any difficulty performing the following activities:  Hearing? 0  Vision? 0  Difficulty concentrating or making decisions? 0  Walking or climbing stairs? 0  Dressing or bathing? 0  Doing errands, shopping? 0    Patient Care Team: [name], MD as PCP - General Thelma Comp, OD as Consulting Physician (Optometry) (Rounding), Imts - Orene Desanctis, MD as Attending Physician  Indicate any recent Medical Services you may have received from other than [facility] providers in the past year (date may be approximate).     Assessment:   This is a routine wellness examination for Kei.  Hearing/Vision screen No results found.  Dietary issues and exercise activities discussed:     Goals Addressed   None   Depression Screen    11/24/2021    1:21 PM 02/18/2021    1:42 PM 08/06/2020    4:48 PM 10/04/2019   10:40 AM 02/19/2019    9:32 AM 02/19/2019    8:45 AM 11/27/2018   10:00 AM  PHQ 2/9 Scores  PHQ - 2 Score 0 0 0 4 1 0 5  PHQ- 9 Score 3 0 0 6 6 0 18    Fall Risk    11/24/2021    1:21 PM 02/24/2021    2:56 PM 02/18/2021    1:42 PM 08/06/2020    4:49 PM 10/04/2019    9:38 AM  [place] in the past year? 0 0 0 1 1  Number falls in past yr:  0  0 1  Injury with Fall?  0  1 0  Risk for fall due to : No Fall Risks  No Fall Risks History of fall(s) Impaired balance/gait;History of fall(s)  Risk for fall due to: Comment    trips over things at work [employer])   Follow up Falls evaluation completed   Falls evaluation completed Falls prevention  discussed    [place]:  Any stairs in or around the home? {YES/NO:21197} If so, are there any without handrails? {YES/NO:21197} Home free of loose throw rugs in walkways, pet beds, electrical cords, etc? {YES/NO:21197} Adequate lighting in your home to reduce risk of falls? {YES/NO:21197}  ASSISTIVE DEVICES UTILIZED TO PREVENT FALLS:  Life alert? {YES/NO:21197} Use of a cane, walker or w/c? {YES/NO:21197} Grab bars in the bathroom? {YES/NO:21197} Shower chair or bench in shower? {YES/NO:21197} Elevated toilet seat or a handicapped toilet? {YES/NO:21197}  TIMED UP AND GO:  Was the test performed? No .  Length of time to ambulate 10 feet: 0 sec.     Cognitive Function:        Immunizations Immunization History  Administered Date(s) Administered   Influenza Whole 09/19/2006, 09/13/2007, 09/13/2008, 10/08/2009, 09/02/2010   Influenza, Seasonal, Injecte, Preservative Fre 01/23/2013   Influenza,inj,Quad PF,6+ Mos 08/23/2013, 09/12/2015, 09/10/2016, 08/22/2017, 08/24/2018, 08/14/2019   PFIZER(Purple Top)SARS-COV-2 Vaccination 02/08/2020, 03/03/2020   Pneumococcal Conjugate-13 12/03/2016   Pneumococcal Polysaccharide-23 09/23/2011, 08/16/2012, 08/16/2019   Td 09/16/2010   Tdap 08/06/2020   Zoster Recombinat (Shingrix) 08/16/2019    TDAP status: Up to date  Flu Vaccine status: Due, Education has been provided regarding the importance of this vaccine. Advised may receive this vaccine at local pharmacy or Health Dept. Aware to provide a copy of the vaccination record if obtained from local pharmacy or Health Dept. Verbalized acceptance and understanding.  Pneumococcal vaccine status: Up to date  Covid-19 vaccine status: Completed vaccines  Qualifies for Shingles Vaccine? Yes  Zostavax completed No   Shingrix Completed?: No.    Education has been provided regarding the importance of this vaccine. Patient has been advised to call insurance  company to determine out of pocket expense if they have not yet received this vaccine. Advised may also receive vaccine at local pharmacy or Health Dept. Verbalized acceptance and understanding.  Screening Tests Health Maintenance  Topic Date Due   Zoster Vaccines- Shingrix (2 of 2) 10/11/2019   OPHTHALMOLOGY EXAM  11/08/2019   COVID-19 Vaccine (3 - Pfizer risk series) 03/31/2020   INFLUENZA VACCINE  06/29/2021   LIPID PANEL  02/18/2022   FOOT EXAM  02/18/2022   HEMOGLOBIN A1C  02/22/2022   COLONOSCOPY (Pts 45-44yr Insurance coverage will need to be confirmed)  05/25/2022   Pneumonia Vaccine 65 Years old (3 - PPSV23 if available, else PCV20) 08/15/2024   TETANUS/TDAP  08/06/2030   Hepatitis C Screening  Completed   HIV Screening  Completed   HPV VACCINES  Aged Out    Health Maintenance  Health Maintenance Due  Topic Date Due   Zoster Vaccines- Shingrix (2 of 2) 10/11/2019   OPHTHALMOLOGY EXAM  11/08/2019   COVID-19 Vaccine (3 - Pfizer risk series) 03/31/2020   INFLUENZA VACCINE  06/29/2021   LIPID PANEL  02/18/2022   FOOT EXAM  02/18/2022   HEMOGLOBIN A1C  02/22/2022    Colorectal cancer screening: Type of screening: Colonoscopy. Completed 05/15/2017. Repeat every 5 years  Lung Cancer Screening: (Low Dose CT Chest recommended if Age 65-80years, 30 pack-year currently smoking OR have quit w/in 15years.) does qualify.   Lung Cancer Screening Referral:  deferred to pcp   Additional Screening:  Hepatitis C Screening: does qualify; Completed 06/16/2011  Vision Screening: Recommended annual ophthalmology exams for early detection of glaucoma and other disorders of the eye. Is the patient up to date with their annual eye exam?  {YES/NO:21197} Who is the provider or what is the name of the office in which the patient attends annual eye exams? *** If pt is not established with a provider, would they like to be referred to a provider to establish care? {YES/NO:21197}.   Dental  Screening: Recommended annual dental exams for proper oral hygiene  Community Resource Referral / Chronic Care Management: CRR required this visit?  No   CCM required this visit?  No      Plan:     I have personally reviewed and noted the following in the patient's chart:   Medical and social history Use of alcohol, tobacco or illicit drugs  Current medications and supplements including opioid prescriptions. Patient is currently taking opioid prescriptions. Information provided to patient regarding non-opioid alternatives. Patient advised to discuss non-opioid treatment plan with their provider. Functional ability and status Nutritional status Physical activity Advanced directives List of other physicians Hospitalizations, surgeries, and ER visits in previous 12 months Vitals Screenings to include cognitive, depression, and falls Referrals and appointments  In addition, I have reviewed and discussed with patient certain preventive protocols, quality metrics, and best practice recommendations. A written personalized care plan for preventive services as well as general preventive health recommendations were provided to patient.     aJudyann Munson CMA   02/25/2022   Nurse Notes: ***

## 2022-02-25 NOTE — Patient Instructions (Signed)

## 2022-03-29 ENCOUNTER — Other Ambulatory Visit: Payer: Self-pay | Admitting: Internal Medicine

## 2022-03-29 DIAGNOSIS — K219 Gastro-esophageal reflux disease without esophagitis: Secondary | ICD-10-CM

## 2022-03-29 DIAGNOSIS — G8929 Other chronic pain: Secondary | ICD-10-CM

## 2022-03-29 DIAGNOSIS — E785 Hyperlipidemia, unspecified: Secondary | ICD-10-CM

## 2022-04-19 ENCOUNTER — Encounter: Payer: Medicare HMO | Admitting: Internal Medicine

## 2022-04-27 ENCOUNTER — Encounter: Payer: Self-pay | Admitting: Internal Medicine

## 2022-04-27 ENCOUNTER — Ambulatory Visit (INDEPENDENT_AMBULATORY_CARE_PROVIDER_SITE_OTHER): Payer: Medicare HMO | Admitting: Internal Medicine

## 2022-04-27 ENCOUNTER — Other Ambulatory Visit: Payer: Self-pay

## 2022-04-27 VITALS — BP 91/50 | HR 67 | Ht 75.0 in | Wt 239.0 lb

## 2022-04-27 DIAGNOSIS — Z7984 Long term (current) use of oral hypoglycemic drugs: Secondary | ICD-10-CM

## 2022-04-27 DIAGNOSIS — M47817 Spondylosis without myelopathy or radiculopathy, lumbosacral region: Secondary | ICD-10-CM

## 2022-04-27 DIAGNOSIS — M5416 Radiculopathy, lumbar region: Secondary | ICD-10-CM | POA: Diagnosis not present

## 2022-04-27 DIAGNOSIS — G8929 Other chronic pain: Secondary | ICD-10-CM

## 2022-04-27 DIAGNOSIS — E1121 Type 2 diabetes mellitus with diabetic nephropathy: Secondary | ICD-10-CM | POA: Diagnosis not present

## 2022-04-27 DIAGNOSIS — E785 Hyperlipidemia, unspecified: Secondary | ICD-10-CM | POA: Diagnosis not present

## 2022-04-27 DIAGNOSIS — M549 Dorsalgia, unspecified: Secondary | ICD-10-CM

## 2022-04-27 DIAGNOSIS — I10 Essential (primary) hypertension: Secondary | ICD-10-CM | POA: Diagnosis not present

## 2022-04-27 DIAGNOSIS — Z87891 Personal history of nicotine dependence: Secondary | ICD-10-CM

## 2022-04-27 LAB — POCT GLYCOSYLATED HEMOGLOBIN (HGB A1C): Hemoglobin A1C: 6.6 % — AB (ref 4.0–5.6)

## 2022-04-27 LAB — GLUCOSE, CAPILLARY: Glucose-Capillary: 100 mg/dL — ABNORMAL HIGH (ref 70–99)

## 2022-04-27 MED ORDER — METFORMIN HCL 1000 MG PO TABS
1000.0000 mg | ORAL_TABLET | Freq: Two times a day (BID) | ORAL | 3 refills | Status: AC
Start: 2022-04-27 — End: ?

## 2022-04-27 MED ORDER — LISINOPRIL 10 MG PO TABS: 10.0000 mg | ORAL_TABLET | Freq: Every day | ORAL | 3 refills | Status: AC

## 2022-04-27 MED ORDER — EMPAGLIFLOZIN 10 MG PO TABS: 10.0000 mg | ORAL_TABLET | Freq: Every day | ORAL | 3 refills | Status: DC

## 2022-04-27 MED ORDER — GABAPENTIN 600 MG PO TABS: 600.0000 mg | ORAL_TABLET | Freq: Three times a day (TID) | ORAL | 0 refills | Status: DC

## 2022-04-27 MED ORDER — ROSUVASTATIN CALCIUM 40 MG PO TABS: 40.0000 mg | ORAL_TABLET | Freq: Every day | ORAL | 3 refills | Status: DC

## 2022-04-27 MED ORDER — DULOXETINE HCL 30 MG PO CPEP: 30.0000 mg | ORAL_CAPSULE | Freq: Every day | ORAL | 2 refills | Status: AC

## 2022-04-27 NOTE — Assessment & Plan Note (Signed)
Patient with well controlled diabetes presents for follow up. His current regimen is metformin 1000 mg BID and Jardiance 10 mg daily. His repeat A1c was 6.6% from 6.6%. He denies any side effects of his medications.  A/P:  - Foot Check today - Ophthalmology referral today  - Repeat A1c in 6 months - Continue current regimen  - BMP today

## 2022-04-27 NOTE — Assessment & Plan Note (Signed)
Patient presents with a Hx of chronic back pain 2/2 secondary to foraminal narrowing due to back to 2014.  He was previously seen by pain management for intra-articular injections, as well as Lyrica and gabapentin.  Patient states that before his acute back injury, his chronic pain was managed on 600 mg of gabapentin 3 times daily, but felt like his pain has been worsening.  He denies any red flag symptoms such as urinary or bowel incontinence, weakness, fevers, or paresthesias.  Today we discussed alternatives or add-on therapy to his current pain regimen, which the patient is agreeable to trialing.  A/P: Patient presents with worsening chronic back pain prior to his acute on chronic back pain secondary to MSK strain.  We will add on Cymbalta today with continuing to his gabapentin regimen. - Continue gabapentin 600 mg 3 times daily - Start Cymbalta 30 mg daily - We will reassess at next visit

## 2022-04-27 NOTE — Assessment & Plan Note (Signed)
Vitals:   04/27/22 1454 04/27/22 1547  BP: 95/60 (!) 91/50  Pulse: 67   SpO2: 98%    Patient presents for follow up on his HTN. He is currently on lisinopril 10 mg daily. He denies any side effects from his medications including dizziness or light headedness.  - Continue Lisinopril 10 mg daily  - BMP today

## 2022-04-27 NOTE — Progress Notes (Unsigned)
   CC: DM follow up, Back pain, Medication Refills  HPI:  [name] is a 66 y.o. person, with a PMH noted below, who presents to the clinic for follow up on his diabetes and new back pain. To see the management of their acute and chronic conditions, please see the A&P note under the Encounters tab.   Past Medical History:  Diagnosis Date   Atrial arrhythmia    CAP (community acquired pneumonia)    Cellulitis of knee, right 06/06/12   Chronic otitis externa 06/06/12   C Multiple trials with antibiocs and antifungal. Cultures were positive for Pseudomonas. Follow up with ENT on 06/14/12     CORONARY ARTERY DISEASE 11/25/2006   Cardiac cath by Dr Terrence Dupont 11/2003 LV showed good LV systolic function, EF of 08-14%. Left main was patent. LAD has 20-30% mid stenosis. Diagonal 1 and diagonal 2 were patent. Left circumflex was patent. OM1 was less than 0.5 mm which was diffusely diseased. OM2 has 20% ostial stenosis which was patent which  was moderate size. OM3 was patent at prior PTCA and stented site. RCA has 20-30% proximal    Hypertension    NSTEMI (non-ST elevated myocardial infarction) ([place]) 08/14/2012   S/p Successful PTCA to proximal OM-3      TOBACCO ABUSE 11/25/2006   Type II diabetes mellitus ([place])    Review of Systems:   Review of Systems  Constitutional:  Negative for chills, fever, malaise/fatigue and weight loss.  Eyes:  Negative for blurred vision and double vision.  Cardiovascular:  Negative for chest pain and palpitations.  Gastrointestinal:  Negative for abdominal pain, nausea and vomiting.  Musculoskeletal:  Positive for back pain. Negative for falls, joint pain, myalgias and neck pain.    Physical Exam:  Vitals:   04/27/22 1448 04/27/22 1454 04/27/22 1547  BP:  95/60 (!) 91/50  Pulse:  67   SpO2:  98%   Weight: 239 lb (108.4 kg) 239 lb (108.4 kg)   Height: '6\' 3"'$  (1.905 m) '6\' 3"'$  (1.905 m)    Physical Exam Constitutional:      General: He is not in acute distress.     Appearance: Normal appearance. He is normal weight. He is not ill-appearing, toxic-appearing or diaphoretic.  Cardiovascular:     Rate and Rhythm: Normal rate and regular rhythm.     Pulses: Normal pulses.     Heart sounds: Normal heart sounds. No murmur heard.   No friction rub. No gallop.     Comments: PD 2+ bilaterally Pulmonary:     Effort: Pulmonary effort is normal. No respiratory distress.     Breath sounds: Normal breath sounds. No wheezing or rales.  Musculoskeletal:     Comments: Bilateral tenderness to the lumbar paraspinal musculature.   Skin:    General: Skin is warm.  Neurological:     Mental Status: He is alert and oriented to person, place, and time.  Psychiatric:        Mood and Affect: Mood normal.        Behavior: Behavior normal.     Assessment & Plan:   See Encounters Tab for problem based charting.  Patient discussed with Dr. Philipp Ovens  No problem-specific Assessment & Plan notes found for this encounter.

## 2022-04-27 NOTE — Assessment & Plan Note (Signed)
Patient presents today with concerns for acute back pain. His symptoms started 2 weeks ago when he was moving boxes at his house. When he lifted a box containing his wife's belongings, he felt his back "give out." He states that resting for long period of time exacerbates his pain, and that tylenol alleviates his pain. He denies any red flag symptoms such as sudden weakness, urinary hesitation, or new paraesthesias.  A/P:  Patient presents for acute on chronic back pain 2/2 to MSK strain. Discussed treating conservatively today with heat therapy, stretches, and as needed NSAIDS.  - Continue conservative management.

## 2022-04-27 NOTE — Patient Instructions (Addendum)
To Mr. Adduci,   It was a pleasure seeing you again. Today we discussed your blood pressure, diabetes, acute and chronic back pain, and diet.   For your blood pressure, you are doing well! Please continue taking your lisinopril daily. We will check your kidney function today.   For your diabetes, your A1c is doing well at 6.6%! We will continue your metformin and Jardiance, and will check your A1c again in 6  months.   For your new back pain, I would recommend heat therapy, gentle back exercises, and tylenol as needed. If you are going to start going to [employer], try not to overdue weight lifting exercises.   For your chronic back pain, we will continue your gabapentin 600 mg three times daily. I am also going to start you on a medication called Cymbalta (duloxetine) that you can take daily.   As far as diet, working towards increasing your leafy greens with lean proteins like fish and chicken is good! Continue to work on your dietary changes.   It was a pleasure working with you as your primary care doctor these past three years, I wish you the best.   We will see you back in clinic in 6 months.  Have a good day,  [name], MD

## 2022-04-28 LAB — BMP8+ANION GAP
Anion Gap: 14 mmol/L (ref 10.0–18.0)
BUN/Creatinine Ratio: 26 — ABNORMAL HIGH (ref 10–24)
BUN: 41 mg/dL — ABNORMAL HIGH (ref 8–27)
CO2: 22 mmol/L (ref 20–29)
Calcium: 9.8 mg/dL (ref 8.6–10.2)
Chloride: 102 mmol/L (ref 96–106)
Creatinine, Ser: 1.56 mg/dL — ABNORMAL HIGH (ref 0.76–1.27)
Glucose: 95 mg/dL (ref 70–99)
Potassium: 5.2 mmol/L (ref 3.5–5.2)
Sodium: 138 mmol/L (ref 134–144)
eGFR: 49 mL/min/{1.73_m2} — ABNORMAL LOW (ref >59)

## 2022-04-28 LAB — LIPID PANEL
Chol/HDL Ratio: 2.4 ratio (ref 0.0–5.0)
Cholesterol, Total: 118 mg/dL (ref 100–199)
HDL: 49 mg/dL (ref >39)
LDL Chol Calc (NIH): 54 mg/dL (ref 0–99)
Triglycerides: 74 mg/dL (ref 0–149)
VLDL Cholesterol Cal: 15 mg/dL (ref 5–40)

## 2022-04-28 NOTE — Assessment & Plan Note (Signed)
Patient continuing to be compliant with his Crestor 40 mg daily. - Continue Crestor 40 mg daily - Lipid panel ordered

## 2022-05-03 NOTE — Progress Notes (Signed)
Internal Medicine Clinic Attending ? ?Case discussed with Dr. Winters  At the time of the visit.  We reviewed the resident?s history and exam and pertinent patient test results.  I agree with the assessment, diagnosis, and plan of care documented in the resident?s note.  ?

## 2022-05-12 ENCOUNTER — Other Ambulatory Visit: Payer: Self-pay | Admitting: Internal Medicine

## 2022-05-12 DIAGNOSIS — K219 Gastro-esophageal reflux disease without esophagitis: Secondary | ICD-10-CM

## 2022-05-22 ENCOUNTER — Encounter: Payer: Self-pay | Admitting: *Deleted

## 2022-09-24 ENCOUNTER — Other Ambulatory Visit: Payer: Self-pay | Admitting: Internal Medicine

## 2022-09-24 DIAGNOSIS — G8929 Other chronic pain: Secondary | ICD-10-CM

## 2022-12-31 ENCOUNTER — Telehealth: Payer: Self-pay | Admitting: Internal Medicine

## 2022-12-31 ENCOUNTER — Other Ambulatory Visit: Payer: Self-pay | Admitting: Student

## 2022-12-31 DIAGNOSIS — E1121 Type 2 diabetes mellitus with diabetic nephropathy: Secondary | ICD-10-CM

## 2022-12-31 DIAGNOSIS — G8929 Other chronic pain: Secondary | ICD-10-CM

## 2023-01-03 NOTE — Telephone Encounter (Signed)
Patient contacted this morning via telephone to schedule an appointment due to med refill request being denied by PCP.  Patient agreed to an appointment for tomorrow 01/04/2023 at 2:15 pm with Dr. Alton Revere.

## 2023-01-04 ENCOUNTER — Ambulatory Visit: Payer: Medicare HMO

## 2023-01-04 NOTE — Progress Notes (Deleted)
CC: T2DM  HPI:  [name] is a 67 y.o. male with past medical history of HTN, HLD, a-fib, NSTEMI, T2DM, GERD, chronic radicular low back pain, and tobacco abuse that presents for T2DM.    Allergies as of 01/04/2023       Reactions   Clindamycin/lincomycin Diarrhea, Nausea And Vomiting        Medication List        Accurate as of January 04, 2023  7:32 AM. If you have any questions, ask your nurse or doctor.          acetaminophen 500 MG tablet Commonly known as: TYLENOL Take 1 tablet (500 mg total) by mouth every 6 (six) hours as needed.   aspirin EC 81 MG tablet Take 1 tablet (81 mg total) by mouth daily.   clopidogrel 75 MG tablet Commonly known as: PLAVIX Take 1 tablet (75 mg total) by mouth daily.   DULoxetine 30 MG capsule Commonly known as: Cymbalta Take 1 capsule (30 mg total) by mouth daily.   empagliflozin 10 MG Tabs tablet Commonly known as: Jardiance Take 1 tablet (10 mg total) by mouth daily.   FISH OIL PO Take by mouth.   gabapentin 600 MG tablet Commonly known as: NEURONTIN TAKE 1 TABLET BY MOUTH THREE TIMES DAILY   isosorbide mononitrate 30 MG 24 hr tablet Commonly known as: IMDUR Take 1 tablet by mouth once daily   lidocaine 5 % Commonly known as: Lidoderm Place 1 patch onto the skin daily. Remove & Discard patch within 12 hours or as directed by MD   lisinopril 10 MG tablet Commonly known as: Zestril Take 1 tablet (10 mg total) by mouth daily.   metFORMIN 1000 MG tablet Commonly known as: GLUCOPHAGE Take 1 tablet (1,000 mg total) by mouth 2 (two) times daily with a meal.   nitroGLYCERIN 0.4 MG SL tablet Commonly known as: NITROSTAT DISSOLVE ONE TABLET UNDER THE TONGUE EVERY 5 MINUTES AS NEEDED FOR CHEST PAIN.  DO NOT EXCEED A TOTAL OF 3 DOSES IN 15 MINUTES   pantoprazole 40 MG tablet Commonly known as: PROTONIX Take 1 tablet by mouth once daily   rosuvastatin 40 MG tablet Commonly known as: CRESTOR Take 1 tablet (40  mg total) by mouth at bedtime.   sotalol 80 MG tablet Commonly known as: BETAPACE Take 1/2 (one-half) tablet by mouth twice daily   VITAMIN B-12 CR PO Take by mouth.         Past Medical History:  Diagnosis Date   Atrial arrhythmia    CAP (community acquired pneumonia)    Cellulitis of knee, right 06/06/12   Chronic otitis externa 06/06/12   C Multiple trials with antibiocs and antifungal. Cultures were positive for Pseudomonas. Follow up with ENT on 06/14/12     CORONARY ARTERY DISEASE 11/25/2006   Cardiac cath by Dr Terrence Dupont 11/2003 LV showed good LV systolic function, EF of 0000000. Left main was patent. LAD has 20-30% mid stenosis. Diagonal 1 and diagonal 2 were patent. Left circumflex was patent. OM1 was less than 0.5 mm which was diffusely diseased. OM2 has 20% ostial stenosis which was patent which  was moderate size. OM3 was patent at prior PTCA and stented site. RCA has 20-30% proximal    Hypertension    NSTEMI (non-ST elevated myocardial infarction) ([place]) 08/14/2012   S/p Successful PTCA to proximal OM-3      TOBACCO ABUSE 11/25/2006   Type II diabetes mellitus ([place])    Review of  Systems:  per HPI.   Physical Exam: *** There were no vitals filed for this visit.  *** Constitutional: Well-developed, well-nourished, appears comfortable  HENT: Normocephalic and atraumatic.  Eyes: EOM are normal. PERRL.  Neck: Normal range of motion.  Cardiovascular: Regular rate, regular rhythm. No murmurs, rubs, or gallops. Normal radial and PT pulses bilaterally. No LE edema.  Pulmonary: Normal respiratory effort. No wheezes, rales, or rhonchi.   Abdominal: Soft. Non-distended. No tenderness. Normal bowel sounds.  Musculoskeletal: Normal range of motion.     Neurological: Alert and oriented to person, place, and time. Non-focal. Skin: warm and dry.    Assessment & Plan:   T2DM Patient has a history of T2DM. Current medications include metFORMIN 1000 mg BID and empagliflozin 10 mg  daily. Patient states that they are *** compliant with these medications. Patient does *** check their blood sugar at home regularly and notes values ***. Patient denies polyuria, polydipsia, fatigue. Patient states that they do *** visit the ophthalmologist for yearly eye exams. A1c was 6.6% eight months ago. A1c today ***%.   Plan: - Urine ACR today*** - Continue metFORMIN 1000 mg BID and empagliflozin 10 mg daily  2. HTN Patient has a history of HTN. Current medications include lisinopril 10 mg daily. Patient states that they are *** compliant with these medications. Patient states that they do *** check their BP regularly at home. Patient denies HA, lightheadedness, dizziness, CP, or SOB. Initial BP today is ***. Repeat BP is ***.   Plan: - Continue lisinopril 10 mg daily  3. A-fib Patient has a history of a-fib. Current medications include aspirin 81 mg daily and sotalol 80 mg daily. Patient reports that they are *** compliant with this medication. Was previously taking clopidogrel 75 mg daily but was discontinued due to bleeding. Patient denies CP, palpitations, or SOB.    Plan: - Continue aspirin 81 mg daily and sotalol 80 mg daily  4. Health Screening: - AWV (25), influenza (), colonoscopy () - Medication refill?     See Encounters Tab for problem based charting.  Patient seen with Dr. Barbaraann Boys

## 2023-01-08 NOTE — Progress Notes (Signed)
No show

## 2023-01-18 ENCOUNTER — Encounter: Payer: 59 | Admitting: Student

## 2023-01-18 ENCOUNTER — Ambulatory Visit (INDEPENDENT_AMBULATORY_CARE_PROVIDER_SITE_OTHER): Payer: 59

## 2023-01-18 DIAGNOSIS — Z0000 Encounter for general adult medical examination without abnormal findings: Secondary | ICD-10-CM | POA: Diagnosis not present

## 2023-01-18 NOTE — Progress Notes (Signed)
Subjective:   [name] is a 67 y.o. male who presents for an Initial Medicare Annual Wellness Visit. I connected with  [name] on 01/18/23 by a audio enabled telemedicine application and verified that I am speaking with the correct person using two identifiers.  Patient Location: Home  Provider Location: Office/Clinic  I discussed the limitations of evaluation and management by telemedicine. The patient expressed understanding and agreed to proceed.   Review of Systems    DEFERRED TO PCP  Cardiac Risk Factors include: advanced age (>61mn, >>73women);diabetes mellitus;male gender;hypertension     Objective:    There were no vitals filed for this visit. There is no height or weight on file to calculate BMI.     01/18/2023   10:46 AM 04/27/2022    2:47 PM 11/24/2021    1:21 PM 02/24/2021    2:55 PM 02/18/2021    1:43 PM 08/06/2020    2:03 PM 10/04/2019    9:39 AM  Advanced Directives  Does Patient Have a Medical Advance Directive? No No No No No No No  Would patient like information on creating a medical advance directive? No - Patient declined No - Patient declined No - Patient declined No - Patient declined No - Patient declined No - Patient declined No - Patient declined    Current Medications (verified) Outpatient Encounter Medications as of 01/18/2023  Medication Sig   acetaminophen (TYLENOL) 500 MG tablet Take 1 tablet (500 mg total) by mouth every 6 (six) hours as needed.   aspirin EC 81 MG EC tablet Take 1 tablet (81 mg total) by mouth daily.   clopidogrel (PLAVIX) 75 MG tablet Take 1 tablet (75 mg total) by mouth daily.   Cyanocobalamin (VITAMIN B-12 CR PO) Take by mouth.   DULoxetine (CYMBALTA) 30 MG capsule Take 1 capsule (30 mg total) by mouth daily.   empagliflozin (JARDIANCE) 10 MG TABS tablet Take 1 tablet (10 mg total) by mouth daily.   gabapentin (NEURONTIN) 600 MG tablet TAKE 1 TABLET BY MOUTH THREE TIMES DAILY   isosorbide mononitrate (IMDUR) 30 MG 24  hr tablet Take 1 tablet by mouth once daily   lidocaine (LIDODERM) 5 % Place 1 patch onto the skin daily. Remove & Discard patch within 12 hours or as directed by MD   lisinopril (ZESTRIL) 10 MG tablet Take 1 tablet (10 mg total) by mouth daily.   metFORMIN (GLUCOPHAGE) 1000 MG tablet Take 1 tablet (1,000 mg total) by mouth 2 (two) times daily with a meal.   nitroGLYCERIN (NITROSTAT) 0.4 MG SL tablet DISSOLVE ONE TABLET UNDER THE TONGUE EVERY 5 MINUTES AS NEEDED FOR CHEST PAIN.  DO NOT EXCEED A TOTAL OF 3 DOSES IN 15 MINUTES   Omega-3 Fatty Acids (FISH OIL PO) Take by mouth.   pantoprazole (PROTONIX) 40 MG tablet Take 1 tablet by mouth once daily   rosuvastatin (CRESTOR) 40 MG tablet Take 1 tablet (40 mg total) by mouth at bedtime.   sotalol (BETAPACE) 80 MG tablet Take 1/2 (one-half) tablet by mouth twice daily   No facility-administered encounter medications on file as of 01/18/2023.    Allergies (verified) Clindamycin/lincomycin   History: Past Medical History:  Diagnosis Date   Atrial arrhythmia    CAP (community acquired pneumonia)    Cellulitis of knee, right 06/06/12   Chronic otitis externa 06/06/12   C Multiple trials with antibiocs and antifungal. Cultures were positive for Pseudomonas. Follow up with ENT on 06/14/12  CORONARY ARTERY DISEASE 11/25/2006   Cardiac cath by Dr Terrence Dupont 11/2003 LV showed good LV systolic function, EF of 0000000. Left main was patent. LAD has 20-30% mid stenosis. Diagonal 1 and diagonal 2 were patent. Left circumflex was patent. OM1 was less than 0.5 mm which was diffusely diseased. OM2 has 20% ostial stenosis which was patent which  was moderate size. OM3 was patent at prior PTCA and stented site. RCA has 20-30% proximal    Hypertension    NSTEMI (non-ST elevated myocardial infarction) ([place]) 08/14/2012   S/p Successful PTCA to proximal OM-3      TOBACCO ABUSE 11/25/2006   Type II diabetes mellitus ([place])    Past Surgical History:  Procedure Laterality  Date   CORONARY ANGIOPLASTY WITH STENT PLACEMENT  ~ 2005   "1"   CORONARY STENT INTERVENTION N/A 12/25/2018   Procedure: CORONARY STENT INTERVENTION;  Surgeon: Lorretta Harp, MD;  Location: [facility];  Service: Cardiovascular;  Laterality: N/A;   CYSTECTOMY  1990's   LUA   LEFT HEART CATH AND CORONARY ANGIOGRAPHY N/A 12/25/2018   Procedure: LEFT HEART CATH AND CORONARY ANGIOGRAPHY;  Surgeon: Charolette Forward, MD;  Location: [facility];  Service: Cardiovascular;  Laterality: N/A;   LEFT HEART CATHETERIZATION WITH CORONARY ANGIOGRAM N/A 08/15/2012   Procedure: LEFT HEART CATHETERIZATION WITH CORONARY ANGIOGRAM;  Surgeon: Clent Demark, MD;  Location: [facility];  Service: Cardiovascular;  Laterality: N/A;   PERCUTANEOUS CORONARY STENT INTERVENTION (PCI-S)  08/15/2012   Procedure: PERCUTANEOUS CORONARY STENT INTERVENTION (PCI-S);  Surgeon: Clent Demark, MD;  Location: [facility];  Service: Cardiovascular;;   Family History  Problem Relation Age of Onset   Cancer Mother    Social History   Socioeconomic History   Marital status: Married    Spouse name: Not on file   Number of children: Not on file   Years of education: Not on file   Highest education level: Not on file  Occupational History   Not on file  Tobacco Use   Smoking status: Former    Packs/day: 0.20    Years: 12.00    Total pack years: 2.40    Types: Cigarettes    Quit date: 06/29/2018    Years since quitting: 4.5   Smokeless tobacco: Never   Tobacco comments:    3 per day   Vaping Use   Vaping Use: Never used  Substance and Sexual Activity   Alcohol use: Yes    Alcohol/week: 0.0 standard drinks of alcohol    Comment: socially   Drug use: No    Comment: former marijuana   Sexual activity: Not on file  Other Topics Concern   Not on file  Social History Narrative   Not on file   Social Determinants of Health   Financial Resource Strain: Medium Risk (01/18/2023)   Overall Financial  Resource Strain (CARDIA)    Difficulty of Paying Living Expenses: Somewhat hard  Food Insecurity: No Food Insecurity (01/18/2023)   Hunger Vital Sign    Worried About Running Out of Food in the Last Year: Never true    Ran Out of Food in the Last Year: Never true  Transportation Needs: No Transportation Needs (01/18/2023)   PRAPARE - Hydrologist (Medical): No    Lack of Transportation (Non-Medical): No  Physical Activity: Sufficiently Active (01/18/2023)   Exercise Vital Sign    Days of Exercise per Week: 7 days  Minutes of Exercise per Session: 40 min  Stress: No Stress Concern Present (01/18/2023)   [place]    Feeling of Stress : Not at all  Social Connections: Moderately Isolated (01/18/2023)   Social Connection and Isolation Panel [NHANES]    Frequency of Communication with Friends and Family: More than three times a week    Frequency of Social Gatherings with Friends and Family: Twice a week    Attends Religious Services: More than 4 times per year    Active Member of [employer] or Organizations: No    Attends Archivist Meetings: Never    Marital Status: Widowed    Tobacco Counseling Counseling given: Not Answered Tobacco comments: 3 per day    Clinical Intake:  Pre-visit preparation completed: Yes  Pain : No/denies pain     Diabetes: Yes Did pt. bring in CBG monitor from home?: No  How often do you need to have someone help you when you read instructions, pamphlets, or other written materials from your doctor or pharmacy?: 1 - Never What is the last grade level you completed in school?: 9TH GRADE  Diabetic?YES   Interpreter Needed?: No  Information entered by :: [facility] Nikiya Starn   Activities of Daily Living    01/18/2023   10:46 AM 04/27/2022    2:54 PM  In your present state of health, do you have any difficulty performing the following activities:   Hearing? 0 0  Vision? 0 0  Difficulty concentrating or making decisions? 0 0  Walking or climbing stairs? 0 0  Dressing or bathing? 0 0  Doing errands, shopping? 0 0  Preparing Food and eating ? N   Using the Toilet? N   In the past six months, have you accidently leaked urine? N   Do you have problems with loss of bowel control? N   Managing your Medications? N   Managing your Finances? N   Housekeeping or managing your Housekeeping? N     Patient Care Team: Starlyn Skeans, MD as PCP - General Thelma Comp, OD as Consulting Physician (Optometry) (Rounding), Imts - Orene Desanctis, MD as Attending Physician  Indicate any recent Medical Services you may have received from other than [facility] providers in the past year (date may be approximate).     Assessment:   This is a routine wellness examination for [name].  Hearing/Vision screen No results found.  Dietary issues and exercise activities discussed: Current Exercise Habits: Home exercise routine, Type of exercise: walking, Time (Minutes): 40, Frequency (Times/Week): 7, Weekly Exercise (Minutes/Week): 280, Intensity: Mild   Goals Addressed   None   Depression Screen    01/18/2023   10:45 AM 04/27/2022    2:53 PM 11/24/2021    1:21 PM 02/18/2021    1:42 PM 08/06/2020    4:48 PM 10/04/2019   10:40 AM 02/19/2019    9:32 AM  PHQ 2/9 Scores  PHQ - 2 Score 0 1 0 0 0 4 1  PHQ- 9 Score   3 0 0 6 6    Fall Risk    01/18/2023   10:46 AM 04/27/2022    2:53 PM 11/24/2021    1:21 PM 02/24/2021    2:56 PM 02/18/2021    1:42 PM  [place] in the past year? 0  0 0 0  Number falls in past yr: 0   0   Injury with Fall? 0   0  Risk for fall due to : No Fall Risks No Fall Risks No Fall Risks  No Fall Risks  Follow up Education provided;Falls evaluation completed Falls evaluation completed Falls evaluation completed      FALL RISK PREVENTION PERTAINING TO THE HOME:  Any stairs in or around the home? No  If so, are there any without  handrails? No  Home free of loose throw rugs in walkways, pet beds, electrical cords, etc? Yes  Adequate lighting in your home to reduce risk of falls? Yes   ASSISTIVE DEVICES UTILIZED TO PREVENT FALLS:  Life alert? No  Use of a cane, walker or w/c? No  Grab bars in the bathroom? No  Shower chair or bench in shower? No  Elevated toilet seat or a handicapped toilet? No   TIMED UP AND GO:  Was the test performed? No .  Length of time to ambulate 10 feet: N/A sec.     Cognitive Function:        01/18/2023   10:47 AM  6CIT Screen  What Year? 0 points  What month? 0 points  What time? 0 points  Count back from 20 0 points  Months in reverse 0 points  Repeat phrase 0 points  Total Score 0 points    Immunizations Immunization History  Administered Date(s) Administered   Influenza Whole 09/19/2006, 09/13/2007, 09/13/2008, 10/08/2009, 09/02/2010   Influenza, Seasonal, Injecte, Preservative Fre 01/23/2013   Influenza,inj,Quad PF,6+ Mos 08/23/2013, 09/12/2015, 09/10/2016, 08/22/2017, 08/24/2018, 08/14/2019   PFIZER(Purple Top)SARS-COV-2 Vaccination 02/08/2020, 03/03/2020   Pneumococcal Conjugate-13 12/03/2016   Pneumococcal Polysaccharide-23 09/23/2011, 08/16/2012, 08/16/2019   Td 09/16/2010   Tdap 08/06/2020   Zoster Recombinat (Shingrix) 08/16/2019    TDAP status: Up to date  Flu Vaccine status: Due, Education has been provided regarding the importance of this vaccine. Advised may receive this vaccine at local pharmacy or Health Dept. Aware to provide a copy of the vaccination record if obtained from local pharmacy or Health Dept. Verbalized acceptance and understanding.  Pneumococcal vaccine status: Up to date  Covid-19 vaccine status: Completed vaccines  Qualifies for Shingles Vaccine? Yes   Zostavax completed Yes   Shingrix Completed?: Yes  Screening Tests Health Maintenance  Topic Date Due   Zoster Vaccines- Shingrix (2 of 2) 10/11/2019   OPHTHALMOLOGY EXAM   11/08/2019   COVID-19 Vaccine (3 - Pfizer risk series) 03/31/2020   Diabetic kidney evaluation - Urine ACR  10/03/2020   COLONOSCOPY (Pts 45-18yr Insurance coverage will need to be confirmed)  05/25/2022   INFLUENZA VACCINE  06/29/2022   HEMOGLOBIN A1C  10/28/2022   Diabetic kidney evaluation - eGFR measurement  04/28/2023   FOOT EXAM  04/28/2023   Medicare Annual Wellness (AWV)  01/19/2024   Pneumonia Vaccine 66 Years old (3 of 3 - PPSV23 or PCV20) 08/15/2024   DTaP/Tdap/Td (3 - Td or Tdap) 08/06/2030   Hepatitis C Screening  Completed   HPV VACCINES  Aged Out    Health Maintenance  Health Maintenance Due  Topic Date Due   Zoster Vaccines- Shingrix (2 of 2) 10/11/2019   OPHTHALMOLOGY EXAM  11/08/2019   COVID-19 Vaccine (3 - Pfizer risk series) 03/31/2020   Diabetic kidney evaluation - Urine ACR  10/03/2020   COLONOSCOPY (Pts 45-470yrInsurance coverage will need to be confirmed)  05/25/2022   INFLUENZA VACCINE  06/29/2022   HEMOGLOBIN A1C  10/28/2022    Colorectal cancer screening: Type of screening: Colonoscopy. Completed 05/25/2017. Repeat every 5 years  Lung Cancer Screening: (Low  Dose CT Chest recommended if Age 57-80 years, 30 pack-year currently smoking OR have quit w/in 15years.) does not qualify.   Lung Cancer Screening Referral: DEFERRED TO PCP   Additional Screening:  Hepatitis C Screening: does qualify; Completed 06/16/2011  Vision Screening: Recommended annual ophthalmology exams for early detection of glaucoma and other disorders of the eye. Is the patient up to date with their annual eye exam?  Yes  Who is the provider or what is the name of the office in which the patient attends annual eye exams? [place]  If pt is not established with a provider, would they like to be referred to a provider to establish care? No .   Dental Screening: Recommended annual dental exams for proper oral hygiene  Community Resource Referral / Chronic Care  Management: CRR required this visit?  No   CCM required this visit?  No      Plan:     I have personally reviewed and noted the following in the patient's chart:   Medical and social history Use of alcohol, tobacco or illicit drugs  Current medications and supplements including opioid prescriptions. Patient is currently taking opioid prescriptions. Information provided to patient regarding non-opioid alternatives. Patient advised to discuss non-opioid treatment plan with their provider. Functional ability and status Nutritional status Physical activity Advanced directives List of other physicians Hospitalizations, surgeries, and ER visits in previous 12 months Vitals Screenings to include cognitive, depression, and falls Referrals and appointments  In addition, I have reviewed and discussed with patient certain preventive protocols, quality metrics, and best practice recommendations. A written personalized care plan for preventive services as well as general preventive health recommendations were provided to patient.     Judyann Munson, CMA   01/18/2023   Nurse Notes: non face to face    [name] , Thank you for taking time to come for your Medicare Wellness Visit. I appreciate your ongoing commitment to your health goals. Please review the following plan we discussed and let me know if I can assist you in the future.   These are the goals we discussed:  Goals      Blood Pressure < 130/80     Eat more fruits and vegetables     Exercise 3x per week (30 min per time)     HEMOGLOBIN A1C < 7.0     LDL CALC < 100        This is a list of the screening recommended for you and due dates:  Health Maintenance  Topic Date Due   Zoster (Shingles) Vaccine (2 of 2) 10/11/2019   Eye exam for diabetics  11/08/2019   COVID-19 Vaccine (3 - Pfizer risk series) 03/31/2020   Yearly kidney health urinalysis for diabetes  10/03/2020   Colon Cancer Screening  05/25/2022   Flu Shot   06/29/2022   Hemoglobin A1C  10/28/2022   Yearly kidney function blood test for diabetes  04/28/2023   Complete foot exam   04/28/2023   Medicare Annual Wellness Visit  01/19/2024   Pneumonia Vaccine (3 of 3 - PPSV23 or PCV20) 08/15/2024   DTaP/Tdap/Td vaccine (3 - Td or Tdap) 08/06/2030   Hepatitis C Screening: USPSTF Recommendation to screen - Ages 27-79 yo.  Completed   HPV Vaccine  Aged Out

## 2023-01-27 ENCOUNTER — Encounter: Payer: 59 | Admitting: Student

## 2023-01-31 NOTE — Progress Notes (Signed)
Internal Medicine Clinic Attending  Case and documentation reviewed.  I reviewed the AWV findings.  I agree with the assessment, diagnosis, and plan of care documented in the AWV note.    Gilles Chiquito, MD

## 2023-03-25 ENCOUNTER — Other Ambulatory Visit: Payer: Self-pay | Admitting: Internal Medicine

## 2023-03-25 DIAGNOSIS — E785 Hyperlipidemia, unspecified: Secondary | ICD-10-CM

## 2023-04-07 ENCOUNTER — Ambulatory Visit (INDEPENDENT_AMBULATORY_CARE_PROVIDER_SITE_OTHER): Payer: 59

## 2023-04-07 VITALS — BP 134/85 | HR 72 | Ht 75.0 in | Wt 255.0 lb

## 2023-04-07 DIAGNOSIS — R109 Unspecified abdominal pain: Secondary | ICD-10-CM | POA: Diagnosis not present

## 2023-04-07 DIAGNOSIS — E1121 Type 2 diabetes mellitus with diabetic nephropathy: Secondary | ICD-10-CM

## 2023-04-07 DIAGNOSIS — K219 Gastro-esophageal reflux disease without esophagitis: Secondary | ICD-10-CM

## 2023-04-07 DIAGNOSIS — Z7984 Long term (current) use of oral hypoglycemic drugs: Secondary | ICD-10-CM | POA: Diagnosis not present

## 2023-04-07 DIAGNOSIS — M25561 Pain in right knee: Secondary | ICD-10-CM | POA: Insufficient documentation

## 2023-04-07 DIAGNOSIS — I10 Essential (primary) hypertension: Secondary | ICD-10-CM | POA: Diagnosis not present

## 2023-04-07 DIAGNOSIS — M25562 Pain in left knee: Secondary | ICD-10-CM | POA: Diagnosis not present

## 2023-04-07 LAB — POCT GLYCOSYLATED HEMOGLOBIN (HGB A1C): Hemoglobin A1C: 7.3 % — AB (ref 4.0–5.6)

## 2023-04-07 LAB — GLUCOSE, CAPILLARY: Glucose-Capillary: 232 mg/dL — ABNORMAL HIGH (ref 70–99)

## 2023-04-07 MED ORDER — EMPAGLIFLOZIN 25 MG PO TABS
25.0000 mg | ORAL_TABLET | Freq: Every day | ORAL | 1 refills | Status: AC
Start: 2023-04-07 — End: ?

## 2023-04-07 MED ORDER — PANTOPRAZOLE SODIUM 40 MG PO TBEC
40.0000 mg | DELAYED_RELEASE_TABLET | Freq: Every day | ORAL | 0 refills | Status: AC
Start: 2023-04-07 — End: ?

## 2023-04-07 NOTE — Assessment & Plan Note (Signed)
Current medications include lisinopril 10 MG daily. Patient states that they are compliant with this medication. Patient denies HA, lightheadedness, dizziness, CP, or SOB. Initial BP today is 134/85.   Plan: - Continue lisinopril 10 MG daily

## 2023-04-07 NOTE — Progress Notes (Signed)
CC: bilateral knee pain  HPI:  [name] is a 67 y.o. male with past medical history of HTN, HLD, T2DM, CAD, a-fib, chronic low back pain w/ SI joint dysfunction and spondylosis, hypogonadism that presents for bilateral knee pain.    Allergies as of 04/07/2023       Reactions   Clindamycin/lincomycin Diarrhea, Nausea And Vomiting        Medication List        Accurate as of Apr 07, 2023  4:54 PM. If you have any questions, ask your nurse or doctor.          acetaminophen 500 MG tablet Commonly known as: TYLENOL Take 1 tablet (500 mg total) by mouth every 6 (six) hours as needed.   aspirin EC 81 MG tablet Take 1 tablet (81 mg total) by mouth daily.   clopidogrel 75 MG tablet Commonly known as: PLAVIX Take 1 tablet (75 mg total) by mouth daily.   DULoxetine 30 MG capsule Commonly known as: Cymbalta Take 1 capsule (30 mg total) by mouth daily.   empagliflozin 25 MG Tabs tablet Commonly known as: JARDIANCE Take 1 tablet (25 mg total) by mouth daily before breakfast. What changed:  medication strength how much to take when to take this Changed by: Karoline Caldwell, MD   FISH OIL PO Take by mouth.   gabapentin 600 MG tablet Commonly known as: NEURONTIN TAKE 1 TABLET BY MOUTH THREE TIMES DAILY   isosorbide mononitrate 30 MG 24 hr tablet Commonly known as: IMDUR Take 1 tablet by mouth once daily   lidocaine 5 % Commonly known as: Lidoderm Place 1 patch onto the skin daily. Remove & Discard patch within 12 hours or as directed by MD   lisinopril 10 MG tablet Commonly known as: Zestril Take 1 tablet (10 mg total) by mouth daily.   metFORMIN 1000 MG tablet Commonly known as: GLUCOPHAGE Take 1 tablet (1,000 mg total) by mouth 2 (two) times daily with a meal.   nitroGLYCERIN 0.4 MG SL tablet Commonly known as: NITROSTAT DISSOLVE ONE TABLET UNDER THE TONGUE EVERY 5 MINUTES AS NEEDED FOR CHEST PAIN.  DO NOT EXCEED A TOTAL OF 3 DOSES IN 15 MINUTES    pantoprazole 40 MG tablet Commonly known as: PROTONIX Take 1 tablet (40 mg total) by mouth daily.   rosuvastatin 40 MG tablet Commonly known as: CRESTOR TAKE 1 TABLET BY MOUTH AT  BEDTIME   sotalol 80 MG tablet Commonly known as: BETAPACE Take 1/2 (one-half) tablet by mouth twice daily   VITAMIN B-12 CR PO Take by mouth.         Past Medical History:  Diagnosis Date   Atrial arrhythmia    CAP (community acquired pneumonia)    Cellulitis of knee, right 06/06/12   Chronic otitis externa 06/06/12   C Multiple trials with antibiocs and antifungal. Cultures were positive for Pseudomonas. Follow up with ENT on 06/14/12     CORONARY ARTERY DISEASE 11/25/2006   Cardiac cath by Dr Sharyn Lull 11/2003 LV showed good LV systolic function, EF of 55-60%. Left main was patent. LAD has 20-30% mid stenosis. Diagonal 1 and diagonal 2 were patent. Left circumflex was patent. OM1 was less than 0.5 mm which was diffusely diseased. OM2 has 20% ostial stenosis which was patent which  was moderate size. OM3 was patent at prior PTCA and stented site. RCA has 20-30% proximal    Hypertension    NSTEMI (non-ST elevated myocardial infarction) (HCC) 08/14/2012  S/p Successful PTCA to proximal OM-3      TOBACCO ABUSE 11/25/2006   Type II diabetes mellitus (HCC)    Review of Systems:  per HPI.   Physical Exam: Vitals:   04/07/23 1334  BP: 134/85  Pulse: 72  SpO2: 99%  Weight: 255 lb (115.7 kg)  Height: 6\' 3"  (1.905 m)   Constitutional: Well-developed, well-nourished, appears comfortable  HENT: Normocephalic and atraumatic.  Eyes: EOM are normal. PERRL.  Neck: Normal range of motion.  Cardiovascular: Regular rate, regular rhythm. No murmurs, rubs, or gallops. Normal radial and PT pulses bilaterally. No LE edema.  Pulmonary: Normal respiratory effort. No wheezes, rales, or rhonchi.   Abdominal: Soft. Non-distended. No tenderness. Normal bowel sounds.  Musculoskeletal: Normal range of motion. Mild  tenderness to palpation of the anterior knee bilaterally primarily below the patella. Minimal diffuse swelling of bilateral knees. No significant warmth or erythema of the knees.  Neurological: Alert and oriented to person, place, and time. Non-focal. Skin: warm and dry.    Assessment & Plan:   See Encounters Tab for problem based charting.  Abdominal discomfort Patient reports an uncomfortable sensation in his abdomen diffusely. Discomfort does not worsen with eating or bowel movements. He occasionally has heartburn symptoms. He also reports not eating as much recently. He states his decreased appetite may be due to this abdominal discomfort but is unsure. Patient denies nausea, vomiting, constipation, diarrhea. Given lack of fever or n/v/d, low suspicion for infectious cause at this time. Having bowel movements, therefore, low suspicion for constipation or obstruction.There was concern for possible gastric ulcer on CT in 10/2017. Patient was started on pantoprazole but no longer taking this medication. Suspect symptoms may be secondary to dyspepsia. Will reassess in 3 weeks after restarting pantoprazole.   Plan: -Restart pantoprazole 40 mg daily  Diabetes mellitus type 2, controlled Current medications include metFORMIN 1000 MG BID and empagliflozin 10 MG daily. Patient reports missing some doses, potentially 2 days a week. Patient endorses fatigue and polyuria but denies polyuria. A1c was 6.6% 11 months ago. A1c today 7.3%. Patient unable to void today. Will obtain urine ACR at next visit.   Plan: - Continue metFORMIN 1000 MG BID - Increase empagliflozin to 25 MG daily - Urine ACR at next visit - BMP today  Hypertension Current medications include lisinopril 10 MG daily. Patient states that they are compliant with this medication. Patient denies HA, lightheadedness, dizziness, CP, or SOB. Initial BP today is 134/85.   Plan: - Continue lisinopril 10 MG daily  Knee pain,  bilateral Presents w/ bilateral knee pain over the last 3 weeks. Patient feels as though he has been more active over this period of time. 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 Denies recent injury or having similar symptoms previously. Tylenol has not helped his pain. He denies any improvement of his knee pain w/ rest. He reports that his pain worsens with going up the stairs. On exam, has minimal swelling of bilateral knees diffusely and tenderness anteriorly. No significant warmth or erythema to suggest joint infection or gout flare. Suspect that his symptoms may be secondary to subpatellar bursitis vs osteoarthritis. There was concern for possible gastric ulcer on CT in 10/2017. Given these findings, and his current abdominal discomfort, will avoid NSAIDs at this time. Will treat conservatively and reassess in 3 weeks. Could consider PT if symptoms do not improve w/ conservative management.   Plan: - Voltaren  gel  - OTC tylenol - Ice & rest    Patient seen with Dr. Oswaldo Done

## 2023-04-07 NOTE — Assessment & Plan Note (Signed)
Patient reports an uncomfortable sensation in his abdomen diffusely. Discomfort does not worsen with eating or bowel movements. He occasionally has heartburn symptoms. He also reports not eating as much recently. He states his decreased appetite may be due to this abdominal discomfort but is unsure. Patient denies nausea, vomiting, constipation, diarrhea. Given lack of fever or n/v/d, low suspicion for infectious cause at this time. Having bowel movements, therefore, low suspicion for constipation or obstruction.There was concern for possible gastric ulcer on CT in 10/2017. Patient was started on pantoprazole but no longer taking this medication. Suspect symptoms may be secondary to dyspepsia. Will reassess in 3 weeks after restarting pantoprazole.   Plan: -Restart pantoprazole 40 mg daily

## 2023-04-07 NOTE — Assessment & Plan Note (Signed)
Current medications include metFORMIN 1000 MG BID and empagliflozin 10 MG daily. Patient reports missing some doses, potentially 2 days a week. Patient endorses fatigue and polyuria but denies polyuria. A1c was 6.6% 11 months ago. A1c today 7.3%. Patient unable to void today. Will obtain urine ACR at next visit.   Plan: - Continue metFORMIN 1000 MG BID - Increase empagliflozin to 25 MG daily - Urine ACR at next visit - BMP today

## 2023-04-07 NOTE — Patient Instructions (Addendum)
Thank you for coming to see [place] in clinic [name].  Plan:  - Please change how you are taking:     - Empagliflozin 25 mg daily (increased from 10 mg daily)  - Please start taking:     - Pantoprazole 40 mg (for you abdominal discomfort)  - Over the counter Voltaren gel (apply to knees for pain)  - Please continue taking:     - metFORMIN 1000 MG twice daily  - Over the counter tylenol as needed (for knee pain)  - We got the following labs today and will call you with the results:     - Urine albumin/creatine to check your kidney function  - BMP to check your kidney function and electrolytes  - Please apply ice to your knees and rest them at the end of the day to help with your knee pain.    It was very nice to see you, thank you for allowing [place] to be involved in your care. We look forward to seeing you next time. Please call our clinic at [phone] if you have any questions or concerns. The best time to call is Monday-Friday from 9am-4pm, but there is someone available 24/7. If after hours or the weekend, call the main hospital number at [phone] and ask for the Internal Medicine Resident On-Call. If you need medication refills, please notify your pharmacy one week in advance and they will send [place] a request.   Please make sure to arrive 15 minutes prior to your next appointment. If you arrive late, you may be asked to reschedule.

## 2023-04-07 NOTE — Assessment & Plan Note (Signed)
Presents w/ bilateral knee pain over the last 3 weeks. Patient feels as though he has been more active over this period of time. 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 Denies recent injury or having similar symptoms previously. Tylenol has not helped his pain. He denies any improvement of his knee pain w/ rest. He reports that his pain worsens with going up the stairs. On exam, has minimal swelling of bilateral knees diffusely and tenderness anteriorly. No significant warmth or erythema to suggest joint infection or gout flare. Suspect that his symptoms may be secondary to subpatellar bursitis vs osteoarthritis. There was concern for possible gastric ulcer on CT in 10/2017. Given these findings, and his current abdominal discomfort, will avoid NSAIDs at this time. Will treat conservatively and reassess in 3 weeks. Could consider PT if symptoms do not improve w/ conservative management.   Plan: - Voltaren gel  - OTC tylenol - Ice & rest

## 2023-04-08 NOTE — Progress Notes (Signed)
Internal Medicine Clinic Attending  Case discussed with Dr. Mapp  At the time of the visit.  We reviewed the resident's history and exam and pertinent patient test results.  I agree with the assessment, diagnosis, and plan of care documented in the resident's note.  

## 2023-04-09 LAB — BMP8+ANION GAP
Anion Gap: 18 mmol/L (ref 10.0–18.0)
BUN/Creatinine Ratio: 19 (ref 10–24)
BUN: 23 mg/dL (ref 8–27)
CO2: 20 mmol/L (ref 20–29)
Calcium: 9.6 mg/dL (ref 8.6–10.2)
Chloride: 102 mmol/L (ref 96–106)
Creatinine, Ser: 1.23 mg/dL (ref 0.76–1.27)
Glucose: 136 mg/dL — ABNORMAL HIGH (ref 70–99)
Potassium: 4.5 mmol/L (ref 3.5–5.2)
Sodium: 140 mmol/L (ref 134–144)
eGFR: 65 mL/min/{1.73_m2} (ref >59)

## 2023-05-03 ENCOUNTER — Ambulatory Visit (INDEPENDENT_AMBULATORY_CARE_PROVIDER_SITE_OTHER): Payer: 59

## 2023-05-03 VITALS — BP 143/74 | HR 60 | Temp 98.0°F | Wt 262.4 lb

## 2023-05-03 DIAGNOSIS — G8929 Other chronic pain: Secondary | ICD-10-CM

## 2023-05-03 DIAGNOSIS — Z7984 Long term (current) use of oral hypoglycemic drugs: Secondary | ICD-10-CM

## 2023-05-03 DIAGNOSIS — M25562 Pain in left knee: Secondary | ICD-10-CM

## 2023-05-03 DIAGNOSIS — E1121 Type 2 diabetes mellitus with diabetic nephropathy: Secondary | ICD-10-CM

## 2023-05-03 DIAGNOSIS — E119 Type 2 diabetes mellitus without complications: Secondary | ICD-10-CM

## 2023-05-03 DIAGNOSIS — F32A Depression, unspecified: Secondary | ICD-10-CM | POA: Diagnosis not present

## 2023-05-03 DIAGNOSIS — M25561 Pain in right knee: Secondary | ICD-10-CM | POA: Diagnosis not present

## 2023-05-03 MED ORDER — NAPROXEN 500 MG PO TABS: 500.0000 mg | ORAL_TABLET | Freq: Two times a day (BID) | ORAL | 0 refills | Status: AC

## 2023-05-03 NOTE — Assessment & Plan Note (Addendum)
Current medications include metFORMIN 1000 MG BID and empagliflozin 25 MG daily. Patient denies polyuria, polydipsia, fatigue. A1c was 7.3% 3 weeks ago. Patient is due for foot exam and urine albumin/creatinine ratio. Given our discussion regarding his knee pain and depression, we did not have time to perform his foot exam/urine ACR. Will need to have these done at next visit.   Plan: - Continue metFORMIN 1000 MG BID and empagliflozin 25 MG daily - Urine ACR/foot exam at next visit - F/u ophthalmology

## 2023-05-03 NOTE — Assessment & Plan Note (Addendum)
Patient reports increased feelings of sadness over the last year after his wife had passed away. He is not currently on any medications for this condition and does not have a counselor/psychiatrist. Patient reports difficulty sleeping, decreased interest in his hobbies, decreased energy, and increased feelings of guilt. He feels as though his feelings of sadness are stopping him from taking care of himself including taking medications. Patient denies difficulty concentrating, visual/auditory hallucinations, denies SI or HI. PHQ9 today is 16.  Patient would not like pharmacotherapy at this time. He is comfortable with meeting with our behavioral health specialist.   Plan: - Referral to behavioral health

## 2023-05-03 NOTE — Assessment & Plan Note (Signed)
Patient was last seen in clinic for bilateral knee pain on 5/9. Patient reported 3 weeks of bilateral knee pain. It was suspected that patient may have subpatellar bursitis vs osteoarthritis. Plan was for conservative management w/ voltaren gel, OTC tylenol, ice & rest. Symptoms today have worsened, particularly in his left knee. He reports having difficulty walking due to the pain in his left knee. Low suspicion for septic arthritis or gout given the time frame and that his symptoms are bilateral. Suspect symptoms are likely secondary to arthritis vs bursitis. NSAIDs were avoided at last visit due to possible gastric ulcer on prior CT. However, patient has no abdominal discomfort over the last few weeks. Will prescribe naproxen and continue his pantoprazole for protection of his GI tract. Will refer to physical therapy. If his pain fails to improve by next visit, I suspect he would benefit from referral to ortho for potential arthroscopy vs knee replacement.   Plan: - Continue voltaren gel, OTC tylenol, ice & rest - Start naproxen 500 mg BID for 2 weeks - Referral for physical therapy

## 2023-05-03 NOTE — Progress Notes (Signed)
CC: Bilateral knee pain  HPI:  [name] is a 67 y.o. male with past medical history of HTN, HLD, T2DM, CAD, a-fib, chronic low back pain w/ SI joint dysfunction and spondylosis, hypogonadism that presents for f/u of bilateral knee pain.    Allergies as of 05/03/2023       Reactions   Clindamycin/lincomycin Diarrhea, Nausea And Vomiting        Medication List        Accurate as of May 03, 2023  5:09 PM. If you have any questions, ask your nurse or doctor.          acetaminophen 500 MG tablet Commonly known as: TYLENOL Take 1 tablet (500 mg total) by mouth every 6 (six) hours as needed.   aspirin EC 81 MG tablet Take 1 tablet (81 mg total) by mouth daily.   clopidogrel 75 MG tablet Commonly known as: PLAVIX Take 1 tablet (75 mg total) by mouth daily.   DULoxetine 30 MG capsule Commonly known as: Cymbalta Take 1 capsule (30 mg total) by mouth daily.   empagliflozin 25 MG Tabs tablet Commonly known as: JARDIANCE Take 1 tablet (25 mg total) by mouth daily before breakfast.   FISH OIL PO Take by mouth.   gabapentin 600 MG tablet Commonly known as: NEURONTIN TAKE 1 TABLET BY MOUTH THREE TIMES DAILY   isosorbide mononitrate 30 MG 24 hr tablet Commonly known as: IMDUR Take 1 tablet by mouth once daily   lidocaine 5 % Commonly known as: Lidoderm Place 1 patch onto the skin daily. Remove & Discard patch within 12 hours or as directed by MD   lisinopril 10 MG tablet Commonly known as: Zestril Take 1 tablet (10 mg total) by mouth daily.   metFORMIN 1000 MG tablet Commonly known as: GLUCOPHAGE Take 1 tablet (1,000 mg total) by mouth 2 (two) times daily with a meal.   naproxen 500 MG tablet Commonly known as: Naprosyn Take 1 tablet (500 mg total) by mouth 2 (two) times daily with a meal for 14 days. Started by: Karoline Caldwell, MD   nitroGLYCERIN 0.4 MG SL tablet Commonly known as: NITROSTAT DISSOLVE ONE TABLET UNDER THE TONGUE EVERY 5 MINUTES AS NEEDED  FOR CHEST PAIN.  DO NOT EXCEED A TOTAL OF 3 DOSES IN 15 MINUTES   pantoprazole 40 MG tablet Commonly known as: PROTONIX Take 1 tablet (40 mg total) by mouth daily.   rosuvastatin 40 MG tablet Commonly known as: CRESTOR TAKE 1 TABLET BY MOUTH AT  BEDTIME   sotalol 80 MG tablet Commonly known as: BETAPACE Take 1/2 (one-half) tablet by mouth twice daily   VITAMIN B-12 CR PO Take by mouth.         Past Medical History:  Diagnosis Date   Atrial arrhythmia    CAP (community acquired pneumonia)    Cellulitis of knee, right 06/06/12   Chronic otitis externa 06/06/12   C Multiple trials with antibiocs and antifungal. Cultures were positive for Pseudomonas. Follow up with ENT on 06/14/12     CORONARY ARTERY DISEASE 11/25/2006   Cardiac cath by Dr Sharyn Lull 11/2003 LV showed good LV systolic function, EF of 55-60%. Left main was patent. LAD has 20-30% mid stenosis. Diagonal 1 and diagonal 2 were patent. Left circumflex was patent. OM1 was less than 0.5 mm which was diffusely diseased. OM2 has 20% ostial stenosis which was patent which  was moderate size. OM3 was patent at prior PTCA and stented site. RCA has 20-30%  proximal    Hypertension    NSTEMI (non-ST elevated myocardial infarction) (HCC) 08/14/2012   S/p Successful PTCA to proximal OM-3      TOBACCO ABUSE 11/25/2006   Type II diabetes mellitus (HCC)    Review of Systems:  per HPI.   Physical Exam: Vitals:   05/03/23 1511 05/03/23 1514  BP:  (!) 143/74  Pulse:  60  Temp:  98 F (36.7 C)  TempSrc:  Oral  SpO2:  99%  Weight: 262 lb 6.4 oz (119 kg) 262 lb 6.4 oz (119 kg)   Constitutional: Well-developed, well-nourished, appears comfortable  HENT: Normocephalic and atraumatic.  Eyes: EOM are normal. PERRL.  Neck: Normal range of motion.  Cardiovascular: Regular rate, regular rhythm. No murmurs, rubs, or gallops. Normal radial and PT pulses bilaterally. No LE edema.  Pulmonary: Normal respiratory effort. No wheezes, rales, or  rhonchi.   Abdominal: Soft. Non-distended. No tenderness. Normal bowel sounds.  Musculoskeletal: Normal range of motion. Mild tenderness to palpation of the right anterior knee medially/laterally. Moderate tenderness to palpation of the left anterior knee medially/laterally. Minimal diffuse swelling of bilateral knees that is symmetrical. No significant warmth or erythema of the knees. No bony abnormality.  Neurological: Alert and oriented to person, place, and time. Non-focal. Skin: warm and dry.    Assessment & Plan:   See Encounters Tab for problem based charting.  Knee pain, bilateral Patient was last seen in clinic for bilateral knee pain on 5/9. Patient reported 3 weeks of bilateral knee pain. It was suspected that patient may have subpatellar bursitis vs osteoarthritis. Plan was for conservative management w/ voltaren gel, OTC tylenol, ice & rest. Symptoms today have worsened, particularly in his left knee. He reports having difficulty walking due to the pain in his left knee. Low suspicion for septic arthritis or gout given the time frame and that his symptoms are bilateral. Suspect symptoms are likely secondary to arthritis vs bursitis. NSAIDs were avoided at last visit due to possible gastric ulcer on prior CT. However, patient has no abdominal discomfort over the last few weeks. Will prescribe naproxen and continue his pantoprazole for protection of his GI tract. Will refer to physical therapy. If his pain fails to improve by next visit, I suspect he would benefit from referral to ortho for potential arthroscopy vs knee replacement.   Plan: - Continue voltaren gel, OTC tylenol, ice & rest - Start naproxen 500 mg BID for 2 weeks - Referral for physical therapy  Depression Patient reports increased feelings of sadness over the last year after his wife had passed away. He is not currently on any medications for this condition and does not have a counselor/psychiatrist. Patient reports  difficulty sleeping, decreased interest in his hobbies, decreased energy, and increased feelings of guilt. He feels as though his feelings of sadness are stopping him from taking care of himself including taking medications. Patient denies difficulty concentrating, visual/auditory hallucinations, denies SI or HI. PHQ9 today is 16.  Patient would not like pharmacotherapy at this time. He is comfortable with meeting with our behavioral health specialist.   Plan: - Referral to behavioral health  Diabetes mellitus type 2, controlled Current medications include metFORMIN 1000 MG BID and empagliflozin 25 MG daily. Patient denies polyuria, polydipsia, fatigue. A1c was 7.3% 3 weeks ago. Patient is due for foot exam and urine albumin/creatinine ratio. Given our discussion regarding his knee pain and depression, we did not have time to perform his foot exam/urine ACR. Will need to have  these done at next visit.   Plan: - Continue metFORMIN 1000 MG BID and empagliflozin 25 MG daily - Urine ACR/foot exam at next visit - F/u ophthalmology    Patient seen with Dr. Lafonda Mosses.

## 2023-05-03 NOTE — Patient Instructions (Addendum)
Thank you for coming to see [place] in clinic [name].  Plan:  - Please continue taking:     - Pantoprazole 40 mg daily  - Please start taking:     - Naproxen 500 mg twice daily for knee pain   - Please continue using: voltaren gel, over the counter tylenol, icing, heating pad, resting for your knee pain  - We referred you to physical therapy (you will be called to schedule an appointment)  - We referred you to our behavioral health specialist to discuss your mental health (you will be called to schedule an appointment)   It was very nice to see you, thank you for allowing [place] to be involved in your care. We look forward to seeing you next time. Please call our clinic at [phone] if you have any questions or concerns. The best time to call is Monday-Friday from 9am-4pm, but there is someone available 24/7. If after hours or the weekend, call the main hospital number at [phone] and ask for the Internal Medicine Resident On-Call. If you need medication refills, please notify your pharmacy one week in advance and they will send [place] a request.   Please make sure to arrive 15 minutes prior to your next appointment. If you arrive late, you may be asked to reschedule.

## 2023-05-05 NOTE — Progress Notes (Signed)
Internal Medicine Clinic Attending  Case discussed with Dr. Mapp  At the time of the visit.  We reviewed the resident's history and exam and pertinent patient test results.  I agree with the assessment, diagnosis, and plan of care documented in the resident's note.  

## 2023-05-31 ENCOUNTER — Ambulatory Visit (INDEPENDENT_AMBULATORY_CARE_PROVIDER_SITE_OTHER): Payer: 59 | Admitting: Licensed Clinical Social Worker

## 2023-05-31 DIAGNOSIS — F32A Depression, unspecified: Secondary | ICD-10-CM

## 2023-05-31 NOTE — BH Specialist Note (Signed)
Patient no-showed today's appointment; appointment was for Telephone visit at 2:30Pm  Behavioral Health Clinician [facility] from here on out)  attempted patient  via Telephone number [phone]    [facility] contacted patient from telephone number [phone]. [facility] left a  VM.   Patient will need to reschedule appointment by calling Internal medicine center [phone].  Christen Butter, MSW, LCSW-A She/Her Behavioral Health Clinician [facility]  Internal Medicine Center Direct Dial:[phone]  Fax [phone] Main Office Phone: [phone] [address] [place], [place] [address] Website: [facility] Internal Medicine [facility] - Cl[facility]  [place], [place]  [place]

## 2023-06-08 ENCOUNTER — Ambulatory Visit: Payer: 59 | Admitting: Licensed Clinical Social Worker

## 2023-06-08 ENCOUNTER — Ambulatory Visit (HOSPITAL_COMMUNITY)
Admission: EM | Admit: 2023-06-08 | Discharge: 2023-06-08 | Disposition: A | Discharge status: Home / Self Care | Payer: 59 | Attending: Nurse Practitioner | Admitting: Nurse Practitioner

## 2023-06-08 ENCOUNTER — Ambulatory Visit (INDEPENDENT_AMBULATORY_CARE_PROVIDER_SITE_OTHER): Payer: 59

## 2023-06-08 ENCOUNTER — Encounter (HOSPITAL_COMMUNITY): Payer: Self-pay

## 2023-06-08 DIAGNOSIS — M25561 Pain in right knee: Secondary | ICD-10-CM

## 2023-06-08 DIAGNOSIS — M79641 Pain in right hand: Secondary | ICD-10-CM | POA: Diagnosis not present

## 2023-06-08 DIAGNOSIS — M25562 Pain in left knee: Secondary | ICD-10-CM | POA: Diagnosis not present

## 2023-06-08 DIAGNOSIS — M25461 Effusion, right knee: Secondary | ICD-10-CM | POA: Diagnosis not present

## 2023-06-08 DIAGNOSIS — S62314A Displaced fracture of base of fourth metacarpal bone, right hand, initial encounter for closed fracture: Secondary | ICD-10-CM

## 2023-06-08 DIAGNOSIS — F32A Depression, unspecified: Secondary | ICD-10-CM

## 2023-06-08 DIAGNOSIS — M25541 Pain in joints of right hand: Secondary | ICD-10-CM | POA: Diagnosis not present

## 2023-06-08 DIAGNOSIS — S62316A Displaced fracture of base of fifth metacarpal bone, right hand, initial encounter for closed fracture: Secondary | ICD-10-CM | POA: Diagnosis not present

## 2023-06-08 DIAGNOSIS — G8929 Other chronic pain: Secondary | ICD-10-CM | POA: Diagnosis not present

## 2023-06-08 DIAGNOSIS — M1811 Unilateral primary osteoarthritis of first carpometacarpal joint, right hand: Secondary | ICD-10-CM | POA: Diagnosis not present

## 2023-06-08 DIAGNOSIS — M25462 Effusion, left knee: Secondary | ICD-10-CM | POA: Diagnosis not present

## 2023-06-08 NOTE — ED Provider Notes (Signed)
[facility]    CSN: [id number] Arrival date & time: 06/08/23  0848      History   Chief Complaint Chief Complaint  Patient presents with   Wrist Injury   Knee Pain    HPI [name] is a 67 y.o. male.   Patient presents today for 3 week history of bilateral knee pain and right hand pain.  Reports 3 weeks ago, he was feeding his hummingbirds and he fell onto his right hand that was outstretched.  He also reports he hit both of his knees on the cement.  Reports severe pain in both knees in the hand ever since.  Reports the hand was very swollen and has improved some.  No numbness or tingling in the hand or fever, nausea/vomiting since the pain began.    He reports both of his knees are also swollen, however reports his knees hurt and are swollen at baseline.  It is a little bit worse than normal per his report.  No lower extremity weakness, sensation of giving way, loud popping or locking of the knees.  No numbness or tingling in the lower extremities.  He is able to ambulate, however it is painful.  Has been taking Tylenol which does not really seem to help much with the pain.  Patient reports history of bilateral chronic knee pain.  Reports he has talked to his regular doctor about this and they have recommended physical therapy which he has not attempted yet.  He has tried using Voltaren gel once without much improvement.    Past Medical History:  Diagnosis Date   Atrial arrhythmia    CAP (community acquired pneumonia)    Cellulitis of knee, right 06/06/12   Chronic otitis externa 06/06/12   C Multiple trials with antibiocs and antifungal. Cultures were positive for Pseudomonas. Follow up with ENT on 06/14/12     CORONARY ARTERY DISEASE 11/25/2006   Cardiac cath by Dr Sharyn Lull 11/2003 LV showed good LV systolic function, EF of 55-60%. Left main was patent. LAD has 20-30% mid stenosis. Diagonal 1 and diagonal 2 were patent. Left circumflex was patent. OM1 was less than 0.5  mm which was diffusely diseased. OM2 has 20% ostial stenosis which was patent which  was moderate size. OM3 was patent at prior PTCA and stented site. RCA has 20-30% proximal    Hypertension    NSTEMI (non-ST elevated myocardial infarction) (HCC) 08/14/2012   S/p Successful PTCA to proximal OM-3      TOBACCO ABUSE 11/25/2006   Type II diabetes mellitus [facility])     Patient Active Problem List   Diagnosis Date Noted   Depression 05/03/2023   Knee pain, bilateral 04/07/2023   At risk for polypharmacy 11/24/2021   Acute back pain less than 4 weeks duration 02/18/2021   Peripheral neuropathy 08/22/2017   Anemia 06/21/2017   Abdominal discomfort 04/27/2017   Hematuria, gross 09/10/2016   Healthcare maintenance 12/13/2013   Dysphagia, unspecified(787.20) 05/10/2013   Lumbosacral spondylosis without myelopathy 04/18/2013   Sacroiliac joint dysfunction of left side 02/21/2013   Leg length discrepancy 02/21/2013   Seborrhea capitis 01/23/2013   CAD (coronary artery disease) 08/14/2012   Tobacco abuse 08/08/2012   Hypertension 09/25/2011   GERD 04/08/2010   Chronic radicular low back pain 07/20/2007   Diabetes mellitus type 2, controlled (HCC) 11/25/2006   HYPOGONADISM 11/25/2006   Hyperlipemia 11/25/2006   Atrial fibrillation (HCC) 11/25/2006    Past Surgical History:  Procedure Laterality Date  CORONARY ANGIOPLASTY WITH STENT PLACEMENT  ~ 2005   "1"   CORONARY STENT INTERVENTION N/A 12/25/2018   Procedure: CORONARY STENT INTERVENTION;  Surgeon: Runell Gess, MD;  [facility];  Service: Cardiovascular;  Laterality: N/A;   CYSTECTOMY  1990's   LUA   LEFT HEART CATH AND CORONARY ANGIOGRAPHY N/A 12/25/2018   Procedure: LEFT HEART CATH AND CORONARY ANGIOGRAPHY;  Surgeon: Rinaldo Cloud, MD;  [facility];  Service: Cardiovascular;  Laterality: N/A;   LEFT HEART CATHETERIZATION WITH CORONARY ANGIOGRAM N/A 08/15/2012   Procedure: LEFT HEART  CATHETERIZATION WITH CORONARY ANGIOGRAM;  Surgeon: Robynn Pane, MD;  Location: [facility];  Service: Cardiovascular;  Laterality: N/A;   PERCUTANEOUS CORONARY STENT INTERVENTION (PCI-S)  08/15/2012   Procedure: PERCUTANEOUS CORONARY STENT INTERVENTION (PCI-S);  Surgeon: Robynn Pane, MD;  Location: [facility] CATH LAB;  Service: Cardiovascular;;       Home Medications    Prior to Admission medications   Medication Sig Start Date End Date Taking? Authorizing Provider  acetaminophen (TYLENOL) 500 MG tablet Take 1 tablet (500 mg total) by mouth every 6 (six) hours as needed. 02/18/21   Dolan Amen, MD  aspirin EC 81 MG EC tablet Take 1 tablet (81 mg total) by mouth daily. 12/27/18   Rinaldo Cloud, MD  clopidogrel (PLAVIX) 75 MG tablet Take 1 tablet (75 mg total) by mouth daily. 12/27/18   Rinaldo Cloud, MD  Cyanocobalamin (VITAMIN B-12 CR PO) Take by mouth.    [provider]  DULoxetine (CYMBALTA) 30 MG capsule Take 1 capsule (30 mg total) by mouth daily. 04/27/22 04/27/23  Dolan Amen, MD  empagliflozin (JARDIANCE) 25 MG TABS tablet Take 1 tablet (25 mg total) by mouth daily before breakfast. 04/07/23   Mapp, Gaylyn Cheers, MD  gabapentin (NEURONTIN) 600 MG tablet TAKE 1 TABLET BY MOUTH THREE TIMES DAILY 12/31/22   Mapp, Gaylyn Cheers, MD  isosorbide mononitrate (IMDUR) 30 MG 24 hr tablet Take 1 tablet by mouth once daily 09/11/19   Dolan Amen, MD  lidocaine (LIDODERM) 5 % Place 1 patch onto the skin daily. Remove & Discard patch within 12 hours or as directed by MD 02/24/21   Dolan Amen, MD  lisinopril (ZESTRIL) 10 MG tablet Take 1 tablet (10 mg total) by mouth daily. 04/27/22   Dolan Amen, MD  metFORMIN (GLUCOPHAGE) 1000 MG tablet Take 1 tablet (1,000 mg total) by mouth 2 (two) times daily with a meal. 04/27/22   Dolan Amen, MD  nitroGLYCERIN (NITROSTAT) 0.4 MG SL tablet DISSOLVE ONE TABLET UNDER THE TONGUE EVERY 5 MINUTES AS NEEDED FOR CHEST PAIN.  DO NOT EXCEED A TOTAL OF 3  DOSES IN 15 MINUTES 02/12/19   Hoffman, Ivon Oelkers Ratliff, DO  Omega-3 Fatty Acids (FISH OIL PO) Take by mouth.    [provider]  pantoprazole (PROTONIX) 40 MG tablet Take 1 tablet (40 mg total) by mouth daily. 04/07/23   Mapp, Gaylyn Cheers, MD  rosuvastatin (CRESTOR) 40 MG tablet TAKE 1 TABLET BY MOUTH AT  BEDTIME 03/28/23   Mapp, Gaylyn Cheers, MD  sotalol (BETAPACE) 80 MG tablet Take 1/2 (one-half) tablet by mouth twice daily 06/09/21   Evlyn Kanner, MD    Family History Family History  Problem Relation Age of Onset   Cancer Mother     Social History Social History   Tobacco Use   Smoking status: Former    Packs/day: 0.20    Years: 12.00    Additional pack years: 0.00  Total pack years: 2.40    Types: Cigarettes    Quit date: 06/29/2018    Years since quitting: 4.9   Smokeless tobacco: Never   Tobacco comments:    3 per day   Vaping Use   Vaping Use: Never used  Substance Use Topics   Alcohol use: Not Currently    Comment: socially   Drug use: No    Comment: former marijuana     Allergies   Clindamycin/lincomycin   Review of Systems Review of Systems Per HPI  Physical Exam Triage Vital Signs ED Triage Vitals  Enc Vitals Group     BP 06/08/23 0907 (!) 147/87     Pulse Rate 06/08/23 0907 70     Resp 06/08/23 0907 16     Temp 06/08/23 0907 98.3 F (36.8 C)     Temp Source 06/08/23 0907 Oral     SpO2 06/08/23 0907 94 %     Weight --      Height --      Head Circumference --      Peak Flow --      Pain Score 06/08/23 0903 10     Pain Loc --      Pain Edu? --      Excl. in GC? --    No data found.  Updated Vital Signs BP (!) 147/87 (BP Location: Left Arm)   Pulse 70   Temp 98.3 F (36.8 C) (Oral)   Resp 16   SpO2 94%   Visual Acuity Right Eye Distance:   Left Eye Distance:   Bilateral Distance:    Right Eye Near:   Left Eye Near:    Bilateral Near:     Physical Exam Vitals and nursing note reviewed.  Constitutional:      General: He is  not in acute distress.    Appearance: Normal appearance. He is not toxic-appearing.  Pulmonary:     Effort: Pulmonary effort is normal. No respiratory distress.  Musculoskeletal:     Right hand: Swelling present.     Left hand: Normal.     Right lower leg: No edema.     Left lower leg: No edema.     Comments: Inspection: Mild swelling to the third through fifth metacarpals without bruising, obvious deformity, or redness; bilateral knees are diffusely edematous without obvious deformity, bruising, redness Palpation: Right third through fifth metacarpals are tender to palpation diffusely; no obvious deformities palpated.  Patient is also diffusely tender to palpation of bilateral knees diffusely; no obvious deformities palpated of the knees. ROM: Difficult to assess secondary to pain Strength: 5/5 bilateral upper and lower extremities Neurovascular: neurovascularly intact in bilateral upper and lower extremities  Skin:    General: Skin is warm and dry.     Capillary Refill: Capillary refill takes less than 2 seconds.     Coloration: Skin is not jaundiced or pale.     Findings: No erythema.  Neurological:     Mental Status: He is alert and oriented to person, place, and time.  Psychiatric:        Behavior: Behavior is cooperative.      UC Treatments / Results  Labs (all labs ordered are listed, but only abnormal results are displayed) Labs Reviewed - No data to display  EKG   Radiology DG Hand Complete Right  Result Date: 06/08/2023 CLINICAL DATA:  Third through fifth metacarpal pain after fall on outstretched hand 3 weeks ago. EXAM: RIGHT HAND - COMPLETE 3+  VIEW COMPARISON:  None available FINDINGS: Mild degenerative changes at the first carpometacarpal joint. Mild degenerative changes of the distal interphalangeal joint of the index finger. Minimally displaced transverse fractures at the base of the fourth and fifth metacarpals. IMPRESSION: Minimally displaced transverse  fractures at the base of the fourth and fifth metacarpals. Electronically Signed   By: Acquanetta Belling M.D.   On: 06/08/2023 09:50   DG Knee AP/LAT W/Sunrise Right  Result Date: 06/08/2023 CLINICAL DATA:  Patient states that he tripped over a cinder block when he was feeding the hummingbirds and fell. Patient c/o right wrist pain and bilateral knee pain. EXAM: RIGHT KNEE 3 VIEWS; LEFT KNEE 3 VIEWS COMPARISON:  None available FINDINGS: Left knee: Small knee joint effusion. Mild joint space loss and spurring of the patellofemoral compartment. No fracture or dislocation. Right knee: Mild joint space loss and spurring of the medial and patellofemoral compartments. Mild spurring of the lateral compartment. No fracture or dislocation. Small knee joint effusion. IMPRESSION: 1. No acute fracture or dislocation of the knees. 2. Small bilateral knee joint effusions. Electronically Signed   By: Acquanetta Belling M.D.   On: 06/08/2023 09:48   DG Knee AP/LAT W/Sunrise Left  Result Date: 06/08/2023 CLINICAL DATA:  Patient states that he tripped over a cinder block when he was feeding the hummingbirds and fell. Patient c/o right wrist pain and bilateral knee pain. EXAM: RIGHT KNEE 3 VIEWS; LEFT KNEE 3 VIEWS COMPARISON:  None available FINDINGS: Left knee: Small knee joint effusion. Mild joint space loss and spurring of the patellofemoral compartment. No fracture or dislocation. Right knee: Mild joint space loss and spurring of the medial and patellofemoral compartments. Mild spurring of the lateral compartment. No fracture or dislocation. Small knee joint effusion. IMPRESSION: 1. No acute fracture or dislocation of the knees. 2. Small bilateral knee joint effusions. Electronically Signed   By: Acquanetta Belling M.D.   On: 06/08/2023 09:48    Procedures Procedures (including critical care time)  Medications Ordered in UC Medications - No data to display  Initial Impression / Assessment and Plan / UC Course  I have reviewed the  triage vital signs and the nursing notes.  Pertinent labs & imaging results that were available during my care of the patient were reviewed by me and considered in my medical decision making (see chart for details).   Patient is well-appearing, normotensive, afebrile, not tachycardic, not tachypneic, oxygenating well on room air.    1. Closed displaced fracture of base of fourth metacarpal bone of right hand, initial encounter 2. Closed displaced fracture of base of fifth metacarpal bone of right hand, initial encounter Ulnar gutter splint applied today of the right hand Recommended close follow-up with Ortho-has been 3 weeks and suspect there is going to be healing by secondary intention at this point Contact information given Continue Tylenol as needed for pain  3. Chronic pain of both knees Recommended continued Voltaren, physical therapy, ice, rest Can also follow-up with Ortho regarding chronic knee pain  The patient was given the opportunity to ask questions.  All questions answered to their satisfaction.  The patient is in agreement to this plan.    Final Clinical Impressions(s) / UC Diagnoses   Final diagnoses:  Closed displaced fracture of base of fourth metacarpal bone of right hand, initial encounter  Closed displaced fracture of base of fifth metacarpal bone of right hand, initial encounter  Chronic pain of both knees     Discharge Instructions  The xray of your hand shows broken bones at the bottom of your 4th and 5th hand bones.  Please wear the splint we have put on today and follow up with an Orthopedist to make sure the area heals appropriately.   The knee xrays today do not show any broken bones.  You have known arthritis in your knees which is probably attributing to pain.  Recommend follow up with Orthopedist. Continue Tylenol [phone] mg every 6 hours and Voltaren gel.    Contact information for an Orthopedic provider is attached.      ED Prescriptions    None    PDMP not reviewed this encounter.   Valentino Nose, NP 06/08/23 1039

## 2023-06-08 NOTE — Discharge Instructions (Addendum)
The xray of your hand shows broken bones at the bottom of your 4th and 5th hand bones.  Please wear the splint we have put on today and follow up with an Orthopedist to make sure the area heals appropriately.   The knee xrays today do not show any broken bones.  You have known arthritis in your knees which is probably attributing to pain.  Recommend follow up with Orthopedist. Continue Tylenol [phone] mg every 6 hours and Voltaren gel.    Contact information for an Orthopedic provider is attached.

## 2023-06-08 NOTE — ED Triage Notes (Addendum)
Patient states that he tripped over a cinder block when he was feeding the hummingbirds and fell. Patient c/o right wrist pain and bilateral knee pain.  Patient denies taking anything for his pain.   Patient added that he has not taken any of his home meds in 3 weeks because he has been hurting so bad including Plavix.

## 2023-06-08 NOTE — Progress Notes (Signed)
Orthopedic Tech Progress Note Patient Details:  [name] 1956/01/22 [id number] Applied ulnar gutter per order.  Ortho Devices Type of Ortho Device: Ulna gutter splint Ortho Device/Splint Location: RUE Ortho Device/Splint Interventions: Ordered, Application, Adjustment   Post Interventions Patient Tolerated: Well Instructions Provided: Adjustment of device, Care of device  Blase Mess 06/08/2023, 10:50 AM

## 2023-06-08 NOTE — BH Specialist Note (Signed)
Integrated Behavioral Health via Telemedicine Visit  06/08/2023 [name] [id number]  Number of Integrated Behavioral Health Clinician visits: 1- Initial Visit  Session Start time: 1330   Session End time: 1400  Total time in minutes: 30   Referring Provider: Mercie Eon Patient/Family location: Home  [name] Provider location: Office All persons participating in visit: Patient and  Types of Service: Telephone visit and Introduction only  I connected with [name] and/or [name]  via  Telephone or Video Enabled Telemedicine Application  (Video is Caregility application) and verified that I am speaking with the correct person using two identifiers. Discussed confidentiality: Yes   I discussed the limitations of telemedicine and the availability of in person appointments.  Discussed there is a possibility of technology failure and discussed alternative modes of communication if that failure occurs.  I discussed that engaging in this telemedicine visit, they consent to the provision of behavioral healthcare and the services will be billed under their insurance.  Patient and/or legal guardian expressed understanding and consented to Telemedicine visit: Yes   Presenting Concerns: Patient and/or family reports the following symptoms/concerns:  Patient reports difficulty sleeping, decreased interest in his hobbies, decreased energy, and increased feelings of guilt. He feels as though his feelings of sadness are stopping him from taking care of himself including taking medications. Patient denies difficulty concentrating, visual/auditory hallucinations, denies SI or HI.  [facility] recommended patient to began grief therapy. [facility] referred patient to [facility]. Patient agrees to begin grief support and socialize.  Duration of problem: 1 year; Severity of problem: severe  Patient and/or Family's Strengths/Protective Factors: Sense of purpose  Goals  Addressed: Patient will:  Reduce symptoms of: depression   Increase knowledge and/or ability of: coping skills   Demonstrate ability to: Increase healthy adjustment to current life circumstances  Progress towards Goals: Ongoing  Interventions: Interventions utilized:  Solution-Focused Strategies, Mindfulness or Management consultant, Mining engineer, Supportive Counseling, and Link to [employer] Standardized Assessments completed: PHQ-SADS     05/03/2023    3:18 PM 04/07/2023    1:33 PM 01/18/2023   10:45 AM  PHQ-SADS Last 3 Score only  PHQ Adolescent Score 16 0 0     Patient and/or Family Response: Patient agrees to therapy.  Assessment: Patient currently experiencing Depression due to loss of wife.   Patient may benefit from ongoing therapy.  Plan: Follow up with behavioral health clinician on : July 24 , telehealth at 2:30 Pm Behavioral recommendations: Solution-Focused Strategies, Mindfulness or Management consultant, Mining engineer, Supportive Counseling, and Link to [employer]): Integrated Art gallery manager (In Clinic) and [employer] [facility] (LME/Outside Clinic)  I discussed the assessment and treatment plan with the patient and/or parent/guardian. They were provided an opportunity to ask questions and all were answered. They agreed with the plan and demonstrated an understanding of the instructions.   They were advised to call back or seek an in-person evaluation if the symptoms worsen or if the condition fails to improve as anticipated. Christen Butter, MSW, LCSW-A She/Her Behavioral Health Clinician [facility]  Internal Medicine Center Direct Dial:[phone]  Fax [phone] Main Office Phone: [phone] [address] [place] [place], [place] [address] Website: [facility] Internal Medicine [facility]  [place], [place]  [place]

## 2023-06-09 ENCOUNTER — Telehealth: Payer: Self-pay

## 2023-06-09 DIAGNOSIS — M79641 Pain in right hand: Secondary | ICD-10-CM | POA: Diagnosis not present

## 2023-06-09 DIAGNOSIS — S62316A Displaced fracture of base of fifth metacarpal bone, right hand, initial encounter for closed fracture: Secondary | ICD-10-CM | POA: Diagnosis not present

## 2023-06-09 DIAGNOSIS — S62314A Displaced fracture of base of fourth metacarpal bone, right hand, initial encounter for closed fracture: Secondary | ICD-10-CM | POA: Diagnosis not present

## 2023-06-13 ENCOUNTER — Ambulatory Visit: Payer: 59 | Attending: Internal Medicine

## 2023-06-13 ENCOUNTER — Telehealth: Payer: Self-pay

## 2023-06-22 ENCOUNTER — Ambulatory Visit (INDEPENDENT_AMBULATORY_CARE_PROVIDER_SITE_OTHER): Payer: 59 | Admitting: Licensed Clinical Social Worker

## 2023-06-22 DIAGNOSIS — F32A Depression, unspecified: Secondary | ICD-10-CM | POA: Diagnosis not present

## 2023-06-22 NOTE — BH Specialist Note (Signed)
Integrated Behavioral Health via Telemedicine Visit  06/22/2023 [name] [id number]  Number of Integrated Behavioral Health Clinician visits: Additional Visit  Session Start time: 1430   Session End time: 1500  Total time in minutes: 30   Referring Provider: Mercie Eon Patient/Family location: Home  [facility] Provider location: Office All persons participating in visit: Patient and  Types of Service: Telephone visit and Introduction only   I connected with [name] and/or [name]'s  via  Telephone or Video Enabled Telemedicine Application  (Video is Caregility application) and verified that I am speaking with the correct person using two identifiers. Discussed confidentiality: Yes    I discussed the limitations of telemedicine and the availability of in person appointments.  Discussed there is a possibility of technology failure and discussed alternative modes of communication if that failure occurs.   I discussed that engaging in this telemedicine visit, they consent to the provision of behavioral healthcare and the services will be billed under their insurance.   Patient and/or legal guardian expressed understanding and consented to Telemedicine visit: Yes    Presenting Concerns: Patient and/or family reports the following symptoms/concerns:  Patient reports having nightmares regarding his wife who passed away. [facility] and patient discussed stages of grief and coping techniques. Patient denies difficulty concentrating, visual/auditory hallucinations, denies SI or HI.   Patient discussed feeling isolated. [facility] recommended patient to began grief therapy. [facility] referred patient to [facility]. Patient agrees to begin grief support and socialize.   Duration of problem: 1 year; Severity of problem: severe   Patient and/or Family's Strengths/Protective Factors: Sense of purpose   Goals Addressed: Patient will:  Reduce symptoms of: depression   Increase  knowledge and/or ability of: coping skills   Demonstrate ability to: Increase healthy adjustment to current life circumstances   Progress towards Goals: Ongoing   Interventions: Interventions utilized:  Solution-Focused Strategies, Mindfulness or Management consultant, Mining engineer, Supportive Counseling, and Link to [employer] Standardized Assessments completed: PHQ-SADS       05/03/2023    3:18 PM 04/07/2023    1:33 PM 01/18/2023   10:45 AM  PHQ-SADS Last 3 Score only  PHQ Adolescent Score 16 0 0      Patient and/or Family Response: Patient agrees to therapy.   Assessment: Patient currently experiencing Depression due to loss of wife.    Patient may benefit from ongoing therapy.   Plan: Follow up with behavioral health clinician on :  Behavioral recommendations: Solution-Focused Strategies, Mindfulness or Relaxation Training, Behavioral Activation, Supportive Counseling, and Link to [employer]): Integrated Art gallery manager (In Clinic) and [employer] [facility] (LME/Outside Clinic)   I discussed the assessment and treatment plan with the patient and/or parent/guardian. They were provided an opportunity to ask questions and all were answered. They agreed with the plan and demonstrated an understanding of the instructions.   They were advised to call back or seek an in-person evaluation if the symptoms worsen or if the condition fails to improve as anticipated. Christen Butter, MSW, LCSW-A She/Her Behavioral Health Clinician [facility]  Internal Medicine Center Direct Dial:[phone]  Fax [phone] Main Office Phone: [phone] [address] [place] [place], [place] [address] Website: [facility] Internal Medicine [facility]  [place], [place]  [place]

## 2023-07-28 ENCOUNTER — Ambulatory Visit (INDEPENDENT_AMBULATORY_CARE_PROVIDER_SITE_OTHER): Payer: 59 | Admitting: Licensed Clinical Social Worker

## 2023-07-28 DIAGNOSIS — F32A Depression, unspecified: Secondary | ICD-10-CM

## 2023-07-28 NOTE — BH Specialist Note (Signed)
Patient no-showed today's appointment; appointment was for Telephone visit at 8:45 am  Behavioral Health Clinician [facility] from here on out)  attempted patient  via Telephone number [phone]    [facility] contacted patient from telephone number [phone]. [facility] left a  VM.   Patient will need to reschedule appointment by calling Internal medicine center [phone].  Christen Butter, MSW, LCSW-A She/Her Behavioral Health Clinician [facility]  Internal Medicine Center Direct Dial:[phone]  Fax [phone] Main Office Phone: [phone] [address] [place] [place], [place] [address] Website: [facility] Internal Medicine [facility]  [place], [place]  

## 2023-09-07 ENCOUNTER — Encounter: Payer: 59 | Admitting: Student

## 2024-01-18 ENCOUNTER — Other Ambulatory Visit: Payer: Self-pay

## 2024-01-18 DIAGNOSIS — E785 Hyperlipidemia, unspecified: Secondary | ICD-10-CM

## 2024-01-18 MED ORDER — ROSUVASTATIN CALCIUM 40 MG PO TABS
40.0000 mg | ORAL_TABLET | Freq: Every day | ORAL | 3 refills | Status: AC
Start: 2024-01-18 — End: ?

## 2024-01-18 NOTE — Telephone Encounter (Signed)
 Medication sent to pharmacy

## 2024-01-20 DIAGNOSIS — I10 Essential (primary) hypertension: Secondary | ICD-10-CM | POA: Diagnosis not present

## 2024-01-20 DIAGNOSIS — E119 Type 2 diabetes mellitus without complications: Secondary | ICD-10-CM | POA: Diagnosis not present

## 2024-01-20 DIAGNOSIS — I2510 Atherosclerotic heart disease of native coronary artery without angina pectoris: Secondary | ICD-10-CM | POA: Diagnosis not present

## 2024-01-20 DIAGNOSIS — E782 Mixed hyperlipidemia: Secondary | ICD-10-CM | POA: Diagnosis not present

## 2024-01-24 ENCOUNTER — Encounter: Payer: 59 | Admitting: Student

## 2024-02-03 ENCOUNTER — Encounter: Payer: 59 | Admitting: Student

## 2024-03-14 ENCOUNTER — Ambulatory Visit: Payer: 59

## 2024-03-14 VITALS — Ht 75.0 in | Wt 245.0 lb

## 2024-03-14 DIAGNOSIS — Z0000 Encounter for general adult medical examination without abnormal findings: Secondary | ICD-10-CM

## 2024-03-14 NOTE — Patient Instructions (Addendum)
 [name] , Thank you for taking time to come for your Medicare Wellness Visit. I appreciate your ongoing commitment to your health goals. Please review the following plan we discussed and let me know if I can assist you in the future.   Referrals/Orders/Follow-Ups/Clinician Recommendations: Keep maintaining your health by keeping your appointments with Dr. Amoako and any specialists that you may see.  Call us  if you need anything.  Have a great year!!!!  This is a list of the screening recommended for you and due dates:  Health Maintenance  Topic Date Due   Zoster (Shingles) Vaccine (2 of 2) 10/11/2019   Eye exam for diabetics  11/08/2019   COVID-19 Vaccine (3 - Pfizer risk series) 03/31/2020   Yearly kidney health urinalysis for diabetes  10/03/2020   Colon Cancer Screening  05/25/2022   Complete foot exam   04/28/2023   Hemoglobin A1C  10/08/2023   Yearly kidney function blood test for diabetes  04/06/2024   Flu Shot  06/29/2024   Pneumonia Vaccine (3 of 3 - PCV20 or PCV21) 08/15/2024   Medicare Annual Wellness Visit  03/14/2025   DTaP/Tdap/Td vaccine (3 - Td or Tdap) 08/06/2030   Hepatitis C Screening  Completed   HPV Vaccine  Aged Out   Meningitis B Vaccine  Aged Out    Advanced directives: (Declined) Advance directive discussed with you today. Even though you declined this today, please call our office should you change your mind, and we can give you the proper paperwork for you to fill out.  Next Medicare Annual Wellness Visit scheduled for next year: Yes

## 2024-03-14 NOTE — Progress Notes (Signed)
 Because this visit was a virtual/telehealth visit,  certain criteria was not obtained, such a blood pressure, CBG if applicable, and timed get up and go. Any medications not marked as "taking" were not mentioned during the medication reconciliation part of the visit. Any vitals not documented were not able to be obtained due to this being a telehealth visit or patient was unable to self-report a recent blood pressure reading due to a lack of equipment at home via telehealth. Vitals that have been documented are verbally provided by the patient.   Subjective:   [name] is a 68 y.o. who presents for a Medicare Wellness preventive visit.  Visit Complete: Virtual I connected with  [name] on 03/14/24 by a audio enabled telemedicine application and verified that I am speaking with the correct person using two identifiers.  Patient Location: Other:  Systems analyst Location: Office/Clinic  I discussed the limitations of evaluation and management by telemedicine. The patient expressed understanding and agreed to proceed.  Vital Signs: Because this visit was a virtual/telehealth visit, some criteria may be missing or patient reported. Any vitals not documented were not able to be obtained and vitals that have been documented are patient reported.  VideoDeclined- This patient declined Librarian, academic. Therefore the visit was completed with audio only.  Persons Participating in Visit: Patient.  AWV Questionnaire: No: Patient Medicare AWV questionnaire was not completed prior to this visit.  Cardiac Risk Factors include: advanced age (>43men, >67 women);diabetes mellitus;dyslipidemia;hypertension;male gender;obesity (BMI >30kg/m2);sedentary lifestyle     Objective:    Today's Vitals   03/14/24 1034  Weight: 245 lb (111.1 kg)  Height: 6\' 3"  (1.905 m)  PainSc: 0-No pain   Body mass index is 30.62 kg/m.     03/14/2024   10:36 AM 05/03/2023    3:21 PM  01/18/2023   10:46 AM 04/27/2022    2:47 PM 11/24/2021    1:21 PM 02/24/2021    2:55 PM 02/18/2021    1:43 PM  Advanced Directives  Does Patient Have a Medical Advance Directive? No No No No No No No  Would patient like information on creating a medical advance directive? No - Patient declined No - Patient declined No - Patient declined No - Patient declined No - Patient declined No - Patient declined No - Patient declined    Current Medications (verified) Outpatient Encounter Medications as of 03/14/2024  Medication Sig   acetaminophen (TYLENOL) 500 MG tablet Take 1 tablet (500 mg total) by mouth every 6 (six) hours as needed.   aspirin EC 81 MG EC tablet Take 1 tablet (81 mg total) by mouth daily.   clopidogrel (PLAVIX) 75 MG tablet Take 1 tablet (75 mg total) by mouth daily.   Cyanocobalamin (VITAMIN B-12 CR PO) Take by mouth.   DULoxetine (CYMBALTA) 30 MG capsule Take 1 capsule (30 mg total) by mouth daily.   empagliflozin (JARDIANCE) 25 MG TABS tablet Take 1 tablet (25 mg total) by mouth daily before breakfast.   gabapentin (NEURONTIN) 600 MG tablet TAKE 1 TABLET BY MOUTH THREE TIMES DAILY   isosorbide mononitrate (IMDUR) 30 MG 24 hr tablet Take 1 tablet by mouth once daily   lidocaine (LIDODERM) 5 % Place 1 patch onto the skin daily. Remove & Discard patch within 12 hours or as directed by MD   lisinopril (ZESTRIL) 10 MG tablet Take 1 tablet (10 mg total) by mouth daily.   metFORMIN (GLUCOPHAGE) 1000 MG tablet Take  1 tablet (1,000 mg total) by mouth 2 (two) times daily with a meal.   nitroGLYCERIN (NITROSTAT) 0.4 MG SL tablet DISSOLVE ONE TABLET UNDER THE TONGUE EVERY 5 MINUTES AS NEEDED FOR CHEST PAIN.  DO NOT EXCEED A TOTAL OF 3 DOSES IN 15 MINUTES   Omega-3 Fatty Acids (FISH OIL PO) Take by mouth.   pantoprazole (PROTONIX) 40 MG tablet Take 1 tablet (40 mg total) by mouth daily.   rosuvastatin (CRESTOR) 40 MG tablet Take 1 tablet (40 mg total) by mouth at bedtime.   sotalol  (BETAPACE) 80 MG tablet Take 1/2 (one-half) tablet by mouth twice daily   No facility-administered encounter medications on file as of 03/14/2024.    Allergies (verified) Clindamycin/lincomycin   History: Past Medical History:  Diagnosis Date   Atrial arrhythmia    CAP (community acquired pneumonia)    Cellulitis of knee, right 06/06/12   Chronic otitis externa 06/06/12   C Multiple trials with antibiocs and antifungal. Cultures were positive for Pseudomonas. Follow up with ENT on 06/14/12     CORONARY ARTERY DISEASE 11/25/2006   Cardiac cath by Dr Glena Landau 11/2003 LV showed good LV systolic function, EF of 55-60%. Left main was patent. LAD has 20-30% mid stenosis. Diagonal 1 and diagonal 2 were patent. Left circumflex was patent. OM1 was less than 0.5 mm which was diffusely diseased. OM2 has 20% ostial stenosis which was patent which  was moderate size. OM3 was patent at prior PTCA and stented site. RCA has 20-30% proximal    Hypertension    NSTEMI (non-ST elevated myocardial infarction) (HCC) 08/14/2012   S/p Successful PTCA to proximal OM-3      TOBACCO ABUSE 11/25/2006   Type II diabetes mellitus (HCC)    Past Surgical History:  Procedure Laterality Date   CORONARY ANGIOPLASTY WITH STENT PLACEMENT  ~ 2005   "1"   CORONARY STENT INTERVENTION N/A 12/25/2018   Procedure: CORONARY STENT INTERVENTION;  Surgeon: Avanell Leigh, MD;  Location: [facility] INVASIVE CV [facility];  Service: Cardiovascular;  Laterality: N/A;   CYSTECTOMY  1990's   LUA   LEFT HEART CATH AND CORONARY ANGIOGRAPHY N/A 12/25/2018   Procedure: LEFT HEART CATH AND CORONARY ANGIOGRAPHY;  Surgeon: Chapman Commodore, MD;  Location: [facility] CV [facility];  Service: Cardiovascular;  Laterality: N/A;   LEFT HEART CATHETERIZATION WITH CORONARY ANGIOGRAM N/A 08/15/2012   Procedure: LEFT HEART CATHETERIZATION WITH CORONARY ANGIOGRAM;  Surgeon: Sharene Dauer, MD;  Location: [facility] CATH LAB;  Service: Cardiovascular;  Laterality: N/A;   PERCUTANEOUS  CORONARY STENT INTERVENTION (PCI-S)  08/15/2012   Procedure: PERCUTANEOUS CORONARY STENT INTERVENTION (PCI-S);  Surgeon: Sharene Dauer, MD;  Location: [facility] CATH [facility];  Service: Cardiovascular;;   Family History  Problem Relation Age of Onset   Cancer Mother    Social History   Socioeconomic History   Marital status: Married    Spouse name: Not on file   Number of children: Not on file   Years of education: Not on file   Highest education level: Not on file  Occupational History   Not on file  Tobacco Use   Smoking status: Former    Current packs/day: 0.00    Average packs/day: 0.2 packs/day for 12.0 years (2.4 ttl pk-yrs)    Types: Cigarettes    Start date: 06/29/2006    Quit date: 06/29/2018    Years since quitting: 5.7   Smokeless tobacco: Never   Tobacco comments:    3 per day   Vaping  Use   Vaping status: Never Used  Substance and Sexual Activity   Alcohol use: Not Currently    Comment: socially   Drug use: No    Comment: former marijuana   Sexual activity: Not on file  Other Topics Concern   Not on file  Social History Narrative   Not on file   Social Drivers of Health   Financial Resource Strain: Medium Risk (03/14/2024)   Overall Financial Resource Strain (CARDIA)    Difficulty of Paying Living Expenses: Somewhat hard  Food Insecurity: No Food Insecurity (03/14/2024)   Hunger Vital Sign    Worried About Running Out of Food in the Last Year: Never true    Ran Out of Food in the Last Year: Never true  Transportation Needs: No Transportation Needs (03/14/2024)   PRAPARE - Administrator, Civil Service (Medical): No    Lack of Transportation (Non-Medical): No  Physical Activity: Sufficiently Active (03/14/2024)   Exercise Vital Sign    Days of Exercise per Week: 7 days    Minutes of Exercise per Session: 40 min  Stress: No Stress Concern Present (03/14/2024)   [employer] of Occupational Health - Occupational Stress Questionnaire    Feeling of  Stress : Not at all  Social Connections: Moderately Isolated (03/14/2024)   Social Connection and Isolation Panel [NHANES]    Frequency of Communication with Friends and Family: More than three times a week    Frequency of Social Gatherings with Friends and Family: Twice a week    Attends Religious Services: More than 4 times per year    Active Member of [employer] or Organizations: No    Attends Banker Meetings: Never    Marital Status: Widowed    Tobacco Counseling Counseling given: Not Answered Tobacco comments: 3 per day     Clinical Intake:  Pre-visit preparation completed: Yes  Pain : No/denies pain Pain Score: 0-No pain     BMI - recorded: 30.62 Nutritional Risks: None Diabetes: Yes CBG done?: No Did pt. bring in CBG monitor from home?: No  Lab Results  Component Value Date   HGBA1C 7.3 (A) 04/07/2023   HGBA1C 6.6 (A) 04/27/2022   HGBA1C 6.6 (A) 11/24/2021     How often do you need to have someone help you when you read instructions, pamphlets, or other written materials from your doctor or pharmacy?: 1 - Never  Interpreter Needed?: No  Information entered by :: Joushua Dugar N. Willford Rabideau, LPN.   Activities of Daily Living     03/14/2024   10:37 AM 05/03/2023    3:11 PM  In your present state of health, do you have any difficulty performing the following activities:  Hearing? 0 0  Vision? 0 0  Difficulty concentrating or making decisions? 0 0  Walking or climbing stairs? 0 1  Dressing or bathing? 0 0  Doing errands, shopping? 0 0  Preparing Food and eating ? N   Using the Toilet? N   In the past six months, have you accidently leaked urine? N   Do you have problems with loss of bowel control? N   Managing your Medications? N   Managing your Finances? N   Housekeeping or managing your Housekeeping? N     Patient Care Team: Kathleen Lime, MD as PCP - General Blair Promise, OD as Consulting Physician (Optometry) (Rounding), Imts Maurice March, MD  as Attending Physician Chalmers Guest, MD as Consulting Physician (Ophthalmology)  Indicate any recent Medical Services  you may have received from other than [facility] providers in the past year (date may be approximate).     Assessment:   This is a routine wellness examination for [place].  Hearing/Vision screen Hearing Screening - Comments:: Denies hearing difficulties. Vision Screening - Comments:: Wears rx glasses - not up to date with routine eye exams with Chalmers Guest, MD.    Goals Addressed             This Visit's Progress    Patient Stated       03/14/2024: No goals at this time.       Depression Screen     03/14/2024   10:36 AM 05/03/2023    3:18 PM 04/07/2023    1:33 PM 01/18/2023   10:45 AM 04/27/2022    2:53 PM 11/24/2021    1:21 PM 02/18/2021    1:42 PM  PHQ 2/9 Scores  PHQ - 2 Score 0 6 0 0 1 0 0  PHQ- 9 Score 0 16    3 0    Fall Risk     03/14/2024   10:36 AM 05/03/2023    3:10 PM 04/07/2023    2:03 PM 01/18/2023   10:46 AM 04/27/2022    2:53 PM  Fall Risk   Falls in the past year? 0 1 0 0   Comment  fell weed-eating     Number falls in past yr: 0 1 0 0   Injury with Fall? 0 0 0 0   Comment  scrapes     Risk for fall due to : No Fall Risks History of fall(s) No Fall Risks No Fall Risks No Fall Risks  Follow up Falls prevention discussed;Falls evaluation completed Falls evaluation completed Falls evaluation completed Education provided;Falls evaluation completed Falls evaluation completed    MEDICARE RISK AT HOME:  Medicare Risk at Home Any stairs in or around the home?: Yes (5 steps on porch) If so, are there any without handrails?: No Home free of loose throw rugs in walkways, pet beds, electrical cords, etc?: Yes Adequate lighting in your home to reduce risk of falls?: Yes Life alert?: No Use of a cane, walker or w/c?: No Grab bars in the bathroom?: No Shower chair or bench in shower?: No Elevated toilet seat or a handicapped toilet?: No  TIMED UP AND  GO:  Was the test performed?  No  Cognitive Function: 6CIT completed    03/14/2024   10:37 AM  MMSE - Mini Mental State Exam  Not completed: Unable to complete        03/14/2024   10:37 AM 01/18/2023   10:47 AM  6CIT Screen  What Year? 0 points 0 points  What month? 0 points 0 points  What time? 0 points 0 points  Count back from 20 0 points 0 points  Months in reverse 0 points 0 points  Repeat phrase 0 points 0 points  Total Score 0 points 0 points    Immunizations Immunization History  Administered Date(s) Administered   Influenza Whole 09/19/2006, 09/13/2007, 09/13/2008, 10/08/2009, 09/02/2010   Influenza, Seasonal, Injecte, Preservative Fre 01/23/2013   Influenza,inj,Quad PF,6+ Mos 08/23/2013, 09/12/2015, 09/10/2016, 08/22/2017, 08/24/2018, 08/14/2019   PFIZER(Purple Top)SARS-COV-2 Vaccination 02/08/2020, 03/03/2020   Pneumococcal Conjugate-13 12/03/2016   Pneumococcal Polysaccharide-23 09/23/2011, 08/16/2012, 08/16/2019   Td 09/16/2010   Tdap 08/06/2020   Zoster Recombinant(Shingrix) 08/16/2019    Screening Tests Health Maintenance  Topic Date Due   Zoster Vaccines- Shingrix (2 of 2) 10/11/2019  OPHTHALMOLOGY EXAM  11/08/2019   COVID-19 Vaccine (3 - Pfizer risk series) 03/31/2020   Diabetic kidney evaluation - Urine ACR  10/03/2020   Colonoscopy  05/25/2022   FOOT EXAM  04/28/2023   HEMOGLOBIN A1C  10/08/2023   Diabetic kidney evaluation - eGFR measurement  04/06/2024   INFLUENZA VACCINE  06/29/2024   Pneumonia Vaccine 33+ Years old (3 of 3 - PCV20 or PCV21) 08/15/2024   Medicare Annual Wellness (AWV)  03/14/2025   DTaP/Tdap/Td (3 - Td or Tdap) 08/06/2030   Hepatitis C Screening  Completed   HPV VACCINES  Aged Out   Meningococcal B Vaccine  Aged Out    Health Maintenance  Health Maintenance Due  Topic Date Due   Zoster Vaccines- Shingrix (2 of 2) 10/11/2019   OPHTHALMOLOGY EXAM  11/08/2019   COVID-19 Vaccine (3 - Pfizer risk series) 03/31/2020    Diabetic kidney evaluation - Urine ACR  10/03/2020   Colonoscopy  05/25/2022   FOOT EXAM  04/28/2023   HEMOGLOBIN A1C  10/08/2023   Diabetic kidney evaluation - eGFR measurement  04/06/2024   Health Maintenance Items Addressed: Yes Patient is due for the following: Diabetic Eye Exam, Diabetic Foot Exam, Colonoscopy, Labwork and vaccines.  Additional Screening:  Vision Screening: Recommended annual ophthalmology exams for early detection of glaucoma and other disorders of the eye.  Dental Screening: Recommended annual dental exams for proper oral hygiene  Community Resource Referral / Chronic Care Management: CRR required this visit?  No   CCM required this visit?  No     Plan:     I have personally reviewed and noted the following in the patient's chart:   Medical and social history Use of alcohol, tobacco or illicit drugs  Current medications and supplements including opioid prescriptions. Patient is not currently taking opioid prescriptions. Functional ability and status Nutritional status Physical activity Advanced directives List of other physicians Hospitalizations, surgeries, and ER visits in previous 12 months Vitals Screenings to include cognitive, depression, and falls Referrals and appointments  In addition, I have reviewed and discussed with patient certain preventive protocols, quality metrics, and best practice recommendations. A written personalized care plan for preventive services as well as general preventive health recommendations were provided to patient.     Margette Sheldon, LPN   2/44/0102   After Visit Summary: (Declined) Due to this being a telephonic visit, with patients personalized plan was offered to patient but patient Declined AVS at this time   Notes: Please refer to Routing Comments.

## 2024-03-21 DIAGNOSIS — I2510 Atherosclerotic heart disease of native coronary artery without angina pectoris: Secondary | ICD-10-CM | POA: Diagnosis not present

## 2024-03-21 DIAGNOSIS — E1122 Type 2 diabetes mellitus with diabetic chronic kidney disease: Secondary | ICD-10-CM | POA: Diagnosis not present

## 2024-03-21 DIAGNOSIS — N1831 Chronic kidney disease, stage 3a: Secondary | ICD-10-CM | POA: Diagnosis not present

## 2024-03-21 DIAGNOSIS — Z87891 Personal history of nicotine dependence: Secondary | ICD-10-CM | POA: Diagnosis not present

## 2024-03-21 DIAGNOSIS — Z79899 Other long term (current) drug therapy: Secondary | ICD-10-CM | POA: Diagnosis not present

## 2024-03-21 DIAGNOSIS — E785 Hyperlipidemia, unspecified: Secondary | ICD-10-CM | POA: Diagnosis not present

## 2024-03-21 DIAGNOSIS — R011 Cardiac murmur, unspecified: Secondary | ICD-10-CM | POA: Diagnosis not present

## 2024-03-21 DIAGNOSIS — I252 Old myocardial infarction: Secondary | ICD-10-CM | POA: Diagnosis not present

## 2024-03-21 DIAGNOSIS — I10 Essential (primary) hypertension: Secondary | ICD-10-CM | POA: Diagnosis not present

## 2024-03-21 DIAGNOSIS — R809 Proteinuria, unspecified: Secondary | ICD-10-CM | POA: Diagnosis not present

## 2024-03-21 DIAGNOSIS — Z0000 Encounter for general adult medical examination without abnormal findings: Secondary | ICD-10-CM | POA: Diagnosis not present

## 2024-04-06 NOTE — Progress Notes (Signed)
 I reviewed the AWV findings of the licensed medical professional who conducted the visit. I was present in the office suite and immediately available to provide assistance and direction throughout the time the service was provided.

## 2024-06-07 ENCOUNTER — Encounter: Payer: Self-pay | Admitting: Nurse Practitioner

## 2024-07-06 ENCOUNTER — Encounter: Payer: Self-pay | Admitting: Gastroenterology

## 2024-09-19 ENCOUNTER — Ambulatory Visit: Admitting: Gastroenterology

## 2024-11-06 ENCOUNTER — Ambulatory Visit: Admitting: Gastroenterology

## 2024-11-06 NOTE — Progress Notes (Deleted)
 [name]

## 2025-03-20 ENCOUNTER — Ambulatory Visit
